# Patient Record
Sex: Female | Born: 1955 | Race: White | Hispanic: No | Marital: Married | Smoking: Former smoker
Health system: Southern US, Community
[De-identification: ages and names within clinical notes are randomized; demographics above are authoritative.]

## PROBLEM LIST (undated history)

## (undated) DIAGNOSIS — E119 Type 2 diabetes mellitus without complications: Secondary | ICD-10-CM

## (undated) DIAGNOSIS — G4733 Obstructive sleep apnea (adult) (pediatric): Secondary | ICD-10-CM

## (undated) DIAGNOSIS — J189 Pneumonia, unspecified organism: Secondary | ICD-10-CM

## (undated) DIAGNOSIS — I480 Paroxysmal atrial fibrillation: Secondary | ICD-10-CM

## (undated) DIAGNOSIS — Z860100 Personal history of colon polyps, unspecified: Secondary | ICD-10-CM

## (undated) DIAGNOSIS — I4729 Other ventricular tachycardia: Secondary | ICD-10-CM

## (undated) DIAGNOSIS — I341 Nonrheumatic mitral (valve) prolapse: Secondary | ICD-10-CM

## (undated) DIAGNOSIS — G4730 Sleep apnea, unspecified: Secondary | ICD-10-CM

## (undated) DIAGNOSIS — E039 Hypothyroidism, unspecified: Secondary | ICD-10-CM

## (undated) DIAGNOSIS — I6529 Occlusion and stenosis of unspecified carotid artery: Secondary | ICD-10-CM

## (undated) DIAGNOSIS — I10 Essential (primary) hypertension: Secondary | ICD-10-CM

## (undated) DIAGNOSIS — K5790 Diverticulosis of intestine, part unspecified, without perforation or abscess without bleeding: Secondary | ICD-10-CM

## (undated) DIAGNOSIS — K219 Gastro-esophageal reflux disease without esophagitis: Secondary | ICD-10-CM

## (undated) DIAGNOSIS — I472 Ventricular tachycardia: Secondary | ICD-10-CM

## (undated) DIAGNOSIS — E785 Hyperlipidemia, unspecified: Secondary | ICD-10-CM

## (undated) HISTORY — DX: Nonrheumatic mitral (valve) prolapse: I34.1

## (undated) HISTORY — DX: Paroxysmal atrial fibrillation: I48.0

## (undated) HISTORY — DX: Diverticulosis of intestine, part unspecified, without perforation or abscess without bleeding: K57.90

## (undated) HISTORY — DX: Personal history of colon polyps, unspecified: Z86.0100

## (undated) HISTORY — DX: Obstructive sleep apnea (adult) (pediatric): G47.33

## (undated) HISTORY — DX: Personal history of colonic polyps: Z86.010

## (undated) HISTORY — DX: Type 2 diabetes mellitus without complications: E11.9

## (undated) HISTORY — DX: Other ventricular tachycardia: I47.29

## (undated) HISTORY — DX: Sleep apnea, unspecified: G47.30

## (undated) HISTORY — DX: Occlusion and stenosis of unspecified carotid artery: I65.29

## (undated) HISTORY — DX: Hyperlipidemia, unspecified: E78.5

## (undated) HISTORY — DX: Ventricular tachycardia: I47.2

## (undated) HISTORY — DX: Essential (primary) hypertension: I10

## (undated) HISTORY — DX: Pneumonia, unspecified organism: J18.9

## (undated) HISTORY — DX: Gastro-esophageal reflux disease without esophagitis: K21.9

---

## 2000-03-07 HISTORY — PX: ABDOMINAL HYSTERECTOMY: SHX81

## 2005-03-07 HISTORY — PX: CLAVICLE SURGERY: SHX598

## 2010-03-07 HISTORY — PX: KNEE ARTHROSCOPY: SUR90

## 2012-03-19 LAB — HM COLONOSCOPY

## 2014-01-01 LAB — HM MAMMOGRAPHY

## 2014-05-08 LAB — PULMONARY FUNCTION TEST

## 2014-08-21 ENCOUNTER — Encounter: Payer: Self-pay | Admitting: Primary Care

## 2014-08-21 ENCOUNTER — Encounter (INDEPENDENT_AMBULATORY_CARE_PROVIDER_SITE_OTHER): Payer: Self-pay

## 2014-08-21 ENCOUNTER — Ambulatory Visit (INDEPENDENT_AMBULATORY_CARE_PROVIDER_SITE_OTHER): Payer: Managed Care, Other (non HMO) | Admitting: Primary Care

## 2014-08-21 VITALS — BP 136/92 | HR 78 | Temp 98.1°F | Ht 66.0 in | Wt 175.8 lb

## 2014-08-21 DIAGNOSIS — E119 Type 2 diabetes mellitus without complications: Secondary | ICD-10-CM | POA: Diagnosis not present

## 2014-08-21 DIAGNOSIS — E785 Hyperlipidemia, unspecified: Secondary | ICD-10-CM

## 2014-08-21 DIAGNOSIS — I10 Essential (primary) hypertension: Secondary | ICD-10-CM

## 2014-08-21 DIAGNOSIS — E039 Hypothyroidism, unspecified: Secondary | ICD-10-CM | POA: Diagnosis not present

## 2014-08-21 DIAGNOSIS — E782 Mixed hyperlipidemia: Secondary | ICD-10-CM | POA: Insufficient documentation

## 2014-08-21 NOTE — Assessment & Plan Note (Signed)
Managed on levothyroxine 88 mcg. Reports last TSH was WNL.

## 2014-08-21 NOTE — Assessment & Plan Note (Signed)
Managed on quinapril and verapamil daily. Slightly elevated upon exam today. She's been under family stress recently. She is to record her readings at home and bring them to next visit in 3 weeks. Will evaluate and adjust meds if necessary.

## 2014-08-21 NOTE — Patient Instructions (Signed)
Start checking your blood pressure once daily around the same time. Ensure you are resting for at least 15 minutes before you measure.  Follow up in 3-4 weeks for re-evaluation of blood pressure.  It was a pleasure to meet you today! Please don't hesitate to call me with any questions. Welcome to [facility]!

## 2014-08-21 NOTE — Progress Notes (Signed)
Pre visit review using our clinic review tool, if applicable. No additional management support is needed unless otherwise documented below in the visit note. 

## 2014-08-21 NOTE — Assessment & Plan Note (Signed)
Managed on pravastatin 20 mg and zetia 10 mg daily. Denies myalgias. Endorses mostly healthy diet although not recently due to stress and move from [place]. Will monitor.

## 2014-08-21 NOTE — Progress Notes (Signed)
Subjective:    Patient ID: Debra Perez, female    DOB: 15-Jan-1956, 59 y.o.   MRN: [id number]  HPI  Debra Perez is a 59 year old female who presents today to establish care and discuss the problems mentioned below. Will review old records which she brought to appointment.  1) Hypertension: Diagnosed 13 years ago. In January 2016 and had a run of ventricular tachycardia while in the hospital for a viral respiratory infection. She then when into atrial fibrillation several days later for a duration of 4 hours and has not been in since. She is managed on verapamil (February 2016 for rate control as well) and quinapril (13 years). She's recently been under a lot of family stress. She will start recording her BP at home and bring her recordings during the next visit.   2) Type 2 Diabetes: Diagnosed 13 years ago. She is managed on glimiperide and janumet. She was evaluated by an endocrinologist and was put on SGL2 and could not tolerate. She checks fasting blood sugars at home and will get 100-130's. Lowest levels have been mid 60's. She has not had anything higher than 210 in the past several months. Last A1C was 7.3 in Feburary 2016  3) Hyperlipidemia: Managed on Pravastatin 20 mg and Zetia 10 mg. Diet consists of oatmeal, nuts, hummus, lean meat, vegetables, some ice cream. She drinks mostly water. She is currently not exercising. Denies myalgias.  4) Hypothyroidism: Diagnosed 15 years and is managed on levothyroxine 88 mcg. Denise palpitations, fatigue.  Review of Systems  Constitutional: Negative for unexpected weight change.  HENT: Negative for rhinorrhea.   Respiratory: Negative for cough and shortness of breath.   Cardiovascular: Negative for chest pain.  Gastrointestinal: Negative for diarrhea and constipation.  Genitourinary: Negative for dysuria and frequency.  Musculoskeletal: Negative for myalgias and arthralgias.  Skin: Negative for rash.  Neurological: Negative for dizziness  and headaches.  Psychiatric/Behavioral:       Denies concerns for anxiety or depression       Past Medical History  Diagnosis Date  . GERD (gastroesophageal reflux disease)   . Hyperlipidemia   . Hypertension   . Diabetes mellitus without complication   . Thyroid disease   . History of colonic polyps     benign  . Mitral valve prolapse   . Obstructive sleep apnea     on CPAP    History   Social History  . Marital Status: Married    Spouse Name: N/A  . Number of Children: N/A  . Years of Education: N/A   Occupational History  . Not on file.   Social History Main Topics  . Smoking status: Former Research scientist (life sciences)  . Smokeless tobacco: Not on file  . Alcohol Use: No  . Drug Use: Not on file  . Sexual Activity: Not on file   Other Topics Concern  . Not on file   Social History Narrative   Married.   1 daughter.   Moved here from [place] due to husbands occupation.   She is a Corporate treasurer.   Enjoys sewing, gardening.    Past Surgical History  Procedure Laterality Date  . Knee arthroscopy  2012  . Clavicle surgery  2007    right clavicle plate pins  . Abdominal hysterectomy  2002    ovaries remain    Family History  Problem Relation Age of Onset  . Depression Mother   . Arthritis Father   . Heart disease Father   . Hypertension  Father   . Sudden death Mother 9    No Known Allergies  No current outpatient prescriptions on file prior to visit.   No current facility-administered medications on file prior to visit.    BP 136/92 mmHg  Pulse 78  Temp(Src) 98.1 F (36.7 C) (Oral)  Ht 5\' 6"  (1.676 m)  Wt 175 lb 12.8 oz (79.742 kg)  BMI 28.39 kg/m2  SpO2 98%    Objective:   Physical Exam  Constitutional: She is oriented to person, place, and time. She appears well-nourished.  HENT:  Head: Normocephalic.  Neck: Neck supple.  Cardiovascular: Normal rate and regular rhythm.   Pulmonary/Chest: Effort normal and breath sounds normal.  Musculoskeletal: Normal  range of motion.  Neurological: She is alert and oriented to person, place, and time.  Skin: Skin is warm and dry.  Psychiatric: She has a normal mood and affect.          Assessment & Plan:

## 2014-08-21 NOTE — Assessment & Plan Note (Signed)
Managed on glimiperide and janumet daily. BS 110-130's.  Last A1C 7.3 in Feburary. Will recheck in August. Consider microalbumin if non completed in past. Will review records.

## 2014-09-17 ENCOUNTER — Ambulatory Visit: Payer: Managed Care, Other (non HMO) | Admitting: Primary Care

## 2014-09-17 ENCOUNTER — Encounter: Payer: Self-pay | Admitting: Primary Care

## 2014-09-17 ENCOUNTER — Ambulatory Visit (INDEPENDENT_AMBULATORY_CARE_PROVIDER_SITE_OTHER): Payer: Managed Care, Other (non HMO) | Admitting: Primary Care

## 2014-09-17 VITALS — BP 124/70 | HR 93 | Temp 97.8°F | Ht 66.0 in | Wt 175.8 lb

## 2014-09-17 DIAGNOSIS — N951 Menopausal and female climacteric states: Secondary | ICD-10-CM | POA: Diagnosis not present

## 2014-09-17 DIAGNOSIS — I10 Essential (primary) hypertension: Secondary | ICD-10-CM

## 2014-09-17 DIAGNOSIS — R232 Flushing: Secondary | ICD-10-CM | POA: Insufficient documentation

## 2014-09-17 DIAGNOSIS — I480 Paroxysmal atrial fibrillation: Secondary | ICD-10-CM | POA: Diagnosis not present

## 2014-09-17 MED ORDER — VENLAFAXINE HCL ER 37.5 MG PO CP24: 37.5000 mg | ORAL_CAPSULE | Freq: Every day | ORAL | Status: DC

## 2014-09-17 NOTE — Assessment & Plan Note (Signed)
Present since February 2016 with a history of v-fib. Moved from [place] recently and would like to establish with cardiology in [place]. She does still experience paroxysmal afib. Referral made. Denies chest pain, palpitations.

## 2014-09-17 NOTE — Assessment & Plan Note (Signed)
Present for 2 years, worse over past 6 months. Present during visit today. RX for Effexor 37.5 mg. Follow up in August.

## 2014-09-17 NOTE — Assessment & Plan Note (Signed)
Stable with normal readings over past 3 weeks as she brought them into the office. Denies chest pain, SOB, headaches.

## 2014-09-17 NOTE — Progress Notes (Signed)
Pre visit review using our clinic review tool, if applicable. No additional management support is needed unless otherwise documented below in the visit note. 

## 2014-09-17 NOTE — Progress Notes (Signed)
Subjective:    Patient ID: Debra Perez, female    DOB: 1955/05/06, 59 y.o.   MRN: [id number]  HPI  Debra Perez is a 59 year old female who presents today for follow up of blood pressure. She was evaluated on 6/16 as a new patient and noted elevated blood pressure readings despite being on quinalapril and verapimil daily. During the last visit she was experiencing increased stress due to family issues and caring for a family member.  She has been recording her blood pressure over the past several weeks which has been 116-130's/70's-80's. Denies chest pain, headaches, blurred vision.  2) Ear fullness: Present to left ear for the past several years intermittently. Today she feels ear fullness to the left side. She takes daily Flonase and hydro mist spray. No daily antihistamine.  3) Atrial fibrillation/V-Fib: Occurred February 2016. Developed after a pneumonia/lung infection. She followed with her cardiologist every 6 months for re-evaluation. She is on verapamil and aspirin 325 mg daily. She was once on Pradaxa which caused GI irritation. She recently moved from [place] and will need to establish in [place].  4) Hot flashes: Present for the past 2 years and have worsened over the past 6 months. Occurs throughout the day every day. Present during visit today  Review of Systems  HENT: Negative for congestion, ear pain, postnasal drip and sore throat.        Left ear fullness  Respiratory: Negative for cough and shortness of breath.   Cardiovascular: Negative for chest pain and palpitations.       Reports paroxysmal afib.  Neurological: Negative for dizziness and headaches.       Past Medical History  Diagnosis Date  . GERD (gastroesophageal reflux disease)   . Hyperlipidemia   . Hypertension   . Diabetes mellitus without complication   . Thyroid disease   . History of colonic polyps     benign  . Mitral valve prolapse   . Obstructive sleep apnea     on CPAP    History   Social  History  . Marital Status: Married    Spouse Name: N/A  . Number of Children: N/A  . Years of Education: N/A   Occupational History  . Not on file.   Social History Main Topics  . Smoking status: Former Research scientist (life sciences)  . Smokeless tobacco: Not on file  . Alcohol Use: No  . Drug Use: Not on file  . Sexual Activity: Not on file   Other Topics Concern  . Not on file   Social History Narrative   Married.   1 daughter.   Moved here from [place] due to husbands occupation.   She is a Corporate treasurer.   Enjoys sewing, gardening.    Past Surgical History  Procedure Laterality Date  . Knee arthroscopy  2012  . Clavicle surgery  2007    right clavicle plate pins  . Abdominal hysterectomy  2002    ovaries remain    Family History  Problem Relation Age of Onset  . Depression Mother   . Arthritis Father   . Heart disease Father   . Hypertension Father   . Sudden death Mother 84    No Known Allergies  Current Outpatient Prescriptions on File Prior to Visit  Medication Sig Dispense Refill  . [name] 50 MG tablet Take 50 mg by mouth at bedtime.    Marland Kitchen aspirin 325 MG tablet Take 325 mg by mouth daily.    Marland Kitchen b complex  vitamins capsule Take 1 capsule by mouth 2 (two) times daily.    . calcium carbonate (TUMS - DOSED IN MG ELEMENTAL CALCIUM) 500 MG chewable tablet Chew 1 tablet by mouth daily.    Marland Kitchen co-enzyme Q-10 50 MG capsule Take 50 mg by mouth daily.    Marland Kitchen docusate sodium (COLACE) 100 MG capsule Take 100 mg by mouth 2 (two) times daily.    Marland Kitchen dronedarone (MULTAQ) 400 MG tablet Take 400 mg by mouth 2 (two) times daily with a meal.    . ezetimibe (ZETIA) 10 MG tablet Take 10 mg by mouth daily.    Marland Kitchen glimepiride (AMARYL) 1 MG tablet Take 1 mg by mouth daily with breakfast.    . levothyroxine (SYNTHROID, LEVOTHROID) 88 MCG tablet Take 88 mcg by mouth daily before breakfast.    . Magnesium 500 MG CAPS Take 1 capsule by mouth 2 (two) times daily.    . Misc Natural Products (TUMERSAID PO)  Take by mouth.    . Multiple Vitamins-Minerals (MULTIVITAMIN ADULT PO) Take 1 capsule by mouth daily.    . Omega-3 Fatty Acids (FISH OIL) 1200 MG CAPS Take 1 capsule by mouth daily after breakfast.    . omeprazole (PRILOSEC) 20 MG capsule Take 20 mg by mouth daily.    . pravastatin (PRAVACHOL) 20 MG tablet Take 20 mg by mouth daily.    . quinapril (ACCUPRIL) 20 MG tablet Take 20 mg by mouth at bedtime.    . sitaGLIPtin-metformin (JANUMET) 50-1000 MG per tablet Take 1 tablet by mouth 2 (two) times daily with a meal.    . verapamil (CALAN) 120 MG tablet Take 120 mg by mouth 3 (three) times daily.     No current facility-administered medications on file prior to visit.    BP 124/70 mmHg  Pulse 93  Temp(Src) 97.8 F (36.6 C) (Oral)  Ht 5\' 6"  (1.676 m)  Wt 175 lb 12.8 oz (79.742 kg)  BMI 28.39 kg/m2  SpO2 98%    Objective:   Physical Exam  Constitutional: She does not appear ill.  HENT:  Right Ear: Tympanic membrane and ear canal normal.  Left Ear: Tympanic membrane and ear canal normal.  No middle ear effusion.  Nose: Nose normal.  Mouth/Throat: Oropharynx is clear and moist.  No cerumen impaction bilaterally  Eyes: Conjunctivae are normal. Pupils are equal, round, and reactive to light.  Cardiovascular: Normal rate, regular rhythm and normal heart sounds.   Pulmonary/Chest: Effort normal and breath sounds normal.  Skin: Skin is warm and dry.  Hot flashes present. Skin pink with warmth.          Assessment & Plan:  Ear fullness:  Present to left ear intermittently x years. No cerumen impaction or effusion noted on exam. Start daily antihistamine such as claritin. Continue flonase PRN. Follow up PRN

## 2014-09-17 NOTE — Patient Instructions (Signed)
Start Effexor 37.5 mg tablets for hot flashes. Take 1 tablet by mouth daily.  Try taking a daily antihistamine such as claritin, zyrtec, or allegra for ear fullness. Continue flonase as needed.  Please schedule a physical with me in August. You will also schedule a lab only appointment one week prior. We will discuss your lab results during your physical.  You will be contacted regarding your referral to Cardiology.  Please let us know if you have not heard back within one week.   It was nice to see you!

## 2014-09-29 ENCOUNTER — Encounter: Payer: Self-pay | Admitting: *Deleted

## 2014-10-22 ENCOUNTER — Other Ambulatory Visit: Payer: Self-pay | Admitting: Primary Care

## 2014-10-22 DIAGNOSIS — I10 Essential (primary) hypertension: Secondary | ICD-10-CM

## 2014-10-22 DIAGNOSIS — E785 Hyperlipidemia, unspecified: Secondary | ICD-10-CM

## 2014-10-22 DIAGNOSIS — E2839 Other primary ovarian failure: Secondary | ICD-10-CM

## 2014-10-22 DIAGNOSIS — E119 Type 2 diabetes mellitus without complications: Secondary | ICD-10-CM

## 2014-10-29 ENCOUNTER — Other Ambulatory Visit (INDEPENDENT_AMBULATORY_CARE_PROVIDER_SITE_OTHER): Payer: Managed Care, Other (non HMO)

## 2014-10-29 DIAGNOSIS — E119 Type 2 diabetes mellitus without complications: Secondary | ICD-10-CM

## 2014-10-29 DIAGNOSIS — E785 Hyperlipidemia, unspecified: Secondary | ICD-10-CM

## 2014-10-29 DIAGNOSIS — I10 Essential (primary) hypertension: Secondary | ICD-10-CM

## 2014-10-29 DIAGNOSIS — E2839 Other primary ovarian failure: Secondary | ICD-10-CM | POA: Diagnosis not present

## 2014-10-29 LAB — CBC
HCT: 34.4 % — ABNORMAL LOW (ref 36.0–46.0)
HEMOGLOBIN: 11.2 g/dL — AB (ref 12.0–15.0)
MCHC: 32.6 g/dL (ref 30.0–36.0)
MCV: 79.5 fl (ref 78.0–100.0)
PLATELETS: 366 10*3/uL (ref 150.0–400.0)
RBC: 4.32 Mil/uL (ref 3.87–5.11)
RDW: 18.4 % — AB (ref 11.5–15.5)
WBC: 8.4 10*3/uL (ref 4.0–10.5)

## 2014-10-29 LAB — COMPREHENSIVE METABOLIC PANEL
ALT: 25 U/L (ref 0–35)
AST: 24 U/L (ref 0–37)
Albumin: 4 g/dL (ref 3.5–5.2)
Alkaline Phosphatase: 40 U/L (ref 39–117)
BUN: 15 mg/dL (ref 6–23)
CALCIUM: 9.5 mg/dL (ref 8.4–10.5)
CHLORIDE: 102 meq/L (ref 96–112)
CO2: 29 meq/L (ref 19–32)
Creatinine, Ser: 0.87 mg/dL (ref 0.40–1.20)
GFR: 71.41 mL/min (ref >60.00)
Glucose, Bld: 98 mg/dL (ref 70–99)
Potassium: 4.3 mEq/L (ref 3.5–5.1)
Sodium: 139 mEq/L (ref 135–145)
Total Bilirubin: 0.3 mg/dL (ref 0.2–1.2)
Total Protein: 7.1 g/dL (ref 6.0–8.3)

## 2014-10-29 LAB — VITAMIN D 25 HYDROXY (VIT D DEFICIENCY, FRACTURES): VITD: 47.41 ng/mL (ref 30.00–100.00)

## 2014-10-29 LAB — LIPID PANEL
CHOL/HDL RATIO: 3
Cholesterol: 154 mg/dL (ref 0–200)
HDL: 54.9 mg/dL (ref >39.00)
LDL CALC: 83 mg/dL (ref 0–99)
NonHDL: 98.8
TRIGLYCERIDES: 80 mg/dL (ref 0.0–149.0)
VLDL: 16 mg/dL (ref 0.0–40.0)

## 2014-10-29 LAB — MICROALBUMIN / CREATININE URINE RATIO
CREATININE, U: 154.4 mg/dL
MICROALB UR: 0.9 mg/dL (ref 0.0–1.9)
MICROALB/CREAT RATIO: 0.6 mg/g (ref 0.0–30.0)

## 2014-10-29 LAB — HEMOGLOBIN A1C: Hgb A1c MFr Bld: 6.9 % — ABNORMAL HIGH (ref 4.6–6.5)

## 2014-11-05 ENCOUNTER — Ambulatory Visit (INDEPENDENT_AMBULATORY_CARE_PROVIDER_SITE_OTHER): Payer: Managed Care, Other (non HMO) | Admitting: Primary Care

## 2014-11-05 ENCOUNTER — Encounter: Payer: Self-pay | Admitting: Primary Care

## 2014-11-05 VITALS — BP 124/80 | HR 70 | Temp 97.5°F | Ht 66.0 in | Wt 176.0 lb

## 2014-11-05 DIAGNOSIS — I480 Paroxysmal atrial fibrillation: Secondary | ICD-10-CM

## 2014-11-05 DIAGNOSIS — E119 Type 2 diabetes mellitus without complications: Secondary | ICD-10-CM

## 2014-11-05 DIAGNOSIS — R232 Flushing: Secondary | ICD-10-CM

## 2014-11-05 DIAGNOSIS — I10 Essential (primary) hypertension: Secondary | ICD-10-CM

## 2014-11-05 DIAGNOSIS — G5682 Other specified mononeuropathies of left upper limb: Secondary | ICD-10-CM

## 2014-11-05 DIAGNOSIS — N951 Menopausal and female climacteric states: Secondary | ICD-10-CM | POA: Diagnosis not present

## 2014-11-05 DIAGNOSIS — Z0000 Encounter for general adult medical examination without abnormal findings: Secondary | ICD-10-CM

## 2014-11-05 DIAGNOSIS — Z23 Encounter for immunization: Secondary | ICD-10-CM | POA: Diagnosis not present

## 2014-11-05 DIAGNOSIS — E785 Hyperlipidemia, unspecified: Secondary | ICD-10-CM

## 2014-11-05 DIAGNOSIS — E039 Hypothyroidism, unspecified: Secondary | ICD-10-CM

## 2014-11-05 MED ORDER — PREDNISONE 20 MG PO TABS: ORAL_TABLET | ORAL | Status: DC

## 2014-11-05 MED ORDER — VENLAFAXINE HCL ER 37.5 MG PO CP24: 37.5000 mg | ORAL_CAPSULE | Freq: Every day | ORAL | Status: DC

## 2014-11-05 NOTE — Assessment & Plan Note (Signed)
Stable today. Continue current regimen. 

## 2014-11-05 NOTE — Assessment & Plan Note (Signed)
TSH WNL on 04/2014. Continue levothyroxine 88 mcg. Will re-evaluate in 04/2015 unless she becomes symptomatic.  Exam unremarkable today

## 2014-11-05 NOTE — Progress Notes (Signed)
Pre visit review using our clinic review tool, if applicable. No additional management support is needed unless otherwise documented below in the visit note. 

## 2014-11-05 NOTE — Progress Notes (Signed)
Subjective:    Patient ID: Debra Perez, female    DOB: 09/05/1955, 59 y.o.   MRN: [id number]  HPI  Ms. Debra Perez is a 59 year old female who presents today for complete physical.  Immunizations: -Tetanus: Has not completed in over 10 years -Influenza: Declines today. Will schedule an appointment during flu clinic. -Pneumonia: Completed Pneumovax 3 years ago.  Diet: Endorses fair diet and consists of: Breakfast: yogurt and fruit Lunch: Pita and hummus Dinner: Protein and vegetable. Desserts: Ice cream 4 days a week Snacks: Carrots Beverages: Drinks mostly water Exercise: She does not currently exercise Eye exam: Completed March 2016 Dental exam: Due. Last visit was 6 months ago. Colonoscopy: Completed in 2014 Dexa: Has never completed Pap Smear: Hysterectomy, ovaries remain Mammogram: Completes annually in October.  1) Hot Flashes: Evaluated in July 2016 for hot flashes that have been present for 2 years which have worsened over the past 6 months. She was initiated on Effexor 37.5 mg last visit. Since her last visit she's had a major reduction in hot flashes.   2) Diabetes Type 2: Diagnosed 13+ years ago. Is currently managed on glimiperide and janumet. Last A1C was in February 2016 at 7.3. A1C last week of 6.9.   Wt Readings from Last 3 Encounters:  11/05/14 176 lb (79.833 kg)  09/17/14 175 lb 12.8 oz (79.742 kg)  08/21/14 175 lb 12.8 oz (79.742 kg)     3) Back/Shoulder pain: History of pinched nerve to the left upper back with numbness/tingling down left arm. She was evaluated by ortho who found this on MRI. Her symptoms began 13 days ago and feel exactly the same as her prior pinched nerve. She visited her Chiropractor this Monday and had a massage yesterday without relief. She's been taking flexeril and advil without relief.    Review of Systems  Constitutional: Negative for unexpected weight change.  HENT: Negative for rhinorrhea.   Respiratory: Negative for  cough and shortness of breath.   Cardiovascular: Negative for chest pain.  Gastrointestinal: Negative for diarrhea and constipation.  Genitourinary: Negative for difficulty urinating.  Musculoskeletal: Positive for myalgias.       Current left upper back pain from pinched nerve  Skin: Negative for rash.  Neurological: Positive for numbness. Negative for dizziness and headaches.  Psychiatric/Behavioral:       Denies concerns for anxiety or depression       Past Medical History  Diagnosis Date  . GERD (gastroesophageal reflux disease)   . Hyperlipidemia   . Hypertension   . Diabetes mellitus without complication   . Thyroid disease   . History of colonic polyps     benign  . Mitral valve prolapse   . Obstructive sleep apnea     on CPAP    Social History   Social History  . Marital Status: Married    Spouse Name: N/A  . Number of Children: N/A  . Years of Education: N/A   Occupational History  . Not on file.   Social History Main Topics  . Smoking status: Former Research scientist (life sciences)  . Smokeless tobacco: Not on file  . Alcohol Use: No  . Drug Use: Not on file  . Sexual Activity: Not on file   Other Topics Concern  . Not on file   Social History Narrative   Married.   1 daughter.   Moved here from [place] due to husbands occupation.   She is a Corporate treasurer.   Enjoys sewing, gardening.    Past  Surgical History  Procedure Laterality Date  . Knee arthroscopy  2012  . Clavicle surgery  2007    right clavicle plate pins  . Abdominal hysterectomy  2002    ovaries remain    Family History  Problem Relation Age of Onset  . Depression Mother   . Arthritis Father   . Heart disease Father   . Hypertension Father   . Sudden death Mother 77    No Known Allergies  Current Outpatient Prescriptions on File Prior to Visit  Medication Sig Dispense Refill  . [name] 50 MG tablet Take 50 mg by mouth at bedtime.    Marland Kitchen aspirin 325 MG tablet Take 325 mg by mouth daily.      Marland Kitchen b complex vitamins capsule Take 1 capsule by mouth 2 (two) times daily.    . calcium carbonate (TUMS - DOSED IN MG ELEMENTAL CALCIUM) 500 MG chewable tablet Chew 1 tablet by mouth daily.    Marland Kitchen co-enzyme Q-10 50 MG capsule Take 50 mg by mouth daily.    Marland Kitchen docusate sodium (COLACE) 100 MG capsule Take 100 mg by mouth 2 (two) times daily.    Marland Kitchen dronedarone (MULTAQ) 400 MG tablet Take 400 mg by mouth 2 (two) times daily with a meal.    . ezetimibe (ZETIA) 10 MG tablet Take 10 mg by mouth daily.    Marland Kitchen glimepiride (AMARYL) 1 MG tablet Take 1 mg by mouth daily with breakfast.    . levothyroxine (SYNTHROID, LEVOTHROID) 88 MCG tablet Take 88 mcg by mouth daily before breakfast.    . Magnesium 500 MG CAPS Take 1 capsule by mouth 2 (two) times daily.    . Misc Natural Products (TUMERSAID PO) Take by mouth.    . Multiple Vitamins-Minerals (MULTIVITAMIN ADULT PO) Take 1 capsule by mouth daily.    . Omega-3 Fatty Acids (FISH OIL) 1200 MG CAPS Take 1 capsule by mouth daily after breakfast.    . omeprazole (PRILOSEC) 20 MG capsule Take 20 mg by mouth daily.    . pravastatin (PRAVACHOL) 20 MG tablet Take 20 mg by mouth daily.    . quinapril (ACCUPRIL) 20 MG tablet Take 20 mg by mouth at bedtime.    . sitaGLIPtin-metformin (JANUMET) 50-1000 MG per tablet Take 1 tablet by mouth 2 (two) times daily with a meal.    . verapamil (CALAN) 120 MG tablet Take 120 mg by mouth 3 (three) times daily.     No current facility-administered medications on file prior to visit.    BP 124/80 mmHg  Pulse 70  Temp(Src) 97.5 F (36.4 C) (Oral)  Ht 5\' 6"  (1.676 m)  Wt 176 lb (79.833 kg)  BMI 28.42 kg/m2  SpO2 94%    Objective:   Physical Exam  Constitutional: She is oriented to person, place, and time. She appears well-nourished.  HENT:  Right Ear: Tympanic membrane and ear canal normal.  Left Ear: Tympanic membrane and ear canal normal.  Nose: Nose normal.  Mouth/Throat: Oropharynx is clear and moist.  Eyes:  Conjunctivae and EOM are normal. Pupils are equal, round, and reactive to light.  Neck: Neck supple.  Cardiovascular: Normal rate and regular rhythm.   Pulmonary/Chest: Effort normal and breath sounds normal.  Abdominal: Soft. Bowel sounds are normal. There is no tenderness.  Musculoskeletal: Normal range of motion. She exhibits tenderness.  Tenderness to left posterior shoulder. No decrease in ROM.  Lymphadenopathy:    She has no cervical adenopathy.  Neurological: She is alert  and oriented to person, place, and time. She has normal reflexes. No cranial nerve deficit.  Skin: Skin is warm and dry.  Psychiatric: She has a normal mood and affect.          Assessment & Plan:  Shoulder pain:  Present to left posterior shoulder x 13 days. Feels like a pinched nerve which she's had in the past. No relief with flexeril and advil Good ROM, tenderness to left posterior shoulder with numbness/tingling down left arm. RX for short burst of prednisone. Discussed the effects of prednisone on sugars. Heat, rest. Follow up PRN.

## 2014-11-05 NOTE — Patient Instructions (Signed)
Start Prednisone for pinched nerve to your shoulder. Take 2 tablets daily for 5 days. This medication will increase your blood sugar. Please be aware and reduce your intake of carbohydrates.  Work to decrease your carbohydrates in the form of bread, pasta, rice, ice cream, and sugary foods.  Continue Effexor for hot flashes.  Please schedule a follow up appointment in 3 months for re-evaluation of diabetes.  It was a pleasure to see you today!

## 2014-11-05 NOTE — Addendum Note (Signed)
Addended by: Jacqualin Combes on: 11/05/2014 02:12 PM   Modules accepted: Orders

## 2014-11-05 NOTE — Assessment & Plan Note (Signed)
Tdap provided today. Declines flu, will wait for flu clinic. Pneumovax completed 3 years ago. Mammogram due in October 2016. No pap as she's had a hysterectomy. Labs mostly unremarkable. Decrease in A1C, slightly anemic. Exam unremarkable except for left shoulder pain. Follow up in 1 year for repeat physical.

## 2014-11-05 NOTE — Assessment & Plan Note (Signed)
Rate and rhythm regular today. Has initial visit with cardiology on 9/1.

## 2014-11-05 NOTE — Assessment & Plan Note (Signed)
Stable on Zetia and pravastatin. Continue current regimen.

## 2014-11-05 NOTE — Assessment & Plan Note (Signed)
Improved with Effexor. Continue same. Will continue to monitor.

## 2014-11-05 NOTE — Assessment & Plan Note (Addendum)
Managed on glimepiride and janumet. A1C improved to 6.9. No change in meds today. She will work to decrease ice cream and carbs. Urine microalbumin completed and is unremarkable. Follow up in 3 months

## 2014-11-06 ENCOUNTER — Encounter: Payer: Self-pay | Admitting: Cardiovascular Disease

## 2014-11-06 ENCOUNTER — Ambulatory Visit (INDEPENDENT_AMBULATORY_CARE_PROVIDER_SITE_OTHER): Payer: Managed Care, Other (non HMO) | Admitting: Cardiovascular Disease

## 2014-11-06 VITALS — BP 124/74 | HR 75 | Ht 66.0 in | Wt 175.0 lb

## 2014-11-06 DIAGNOSIS — I4891 Unspecified atrial fibrillation: Secondary | ICD-10-CM | POA: Diagnosis not present

## 2014-11-06 DIAGNOSIS — K219 Gastro-esophageal reflux disease without esophagitis: Secondary | ICD-10-CM | POA: Diagnosis not present

## 2014-11-06 DIAGNOSIS — I4729 Other ventricular tachycardia: Secondary | ICD-10-CM | POA: Insufficient documentation

## 2014-11-06 DIAGNOSIS — I10 Essential (primary) hypertension: Secondary | ICD-10-CM

## 2014-11-06 DIAGNOSIS — I472 Ventricular tachycardia: Secondary | ICD-10-CM

## 2014-11-06 DIAGNOSIS — E785 Hyperlipidemia, unspecified: Secondary | ICD-10-CM | POA: Diagnosis not present

## 2014-11-06 DIAGNOSIS — E119 Type 2 diabetes mellitus without complications: Secondary | ICD-10-CM | POA: Diagnosis not present

## 2014-11-06 MED ORDER — METOPROLOL TARTRATE 25 MG PO TABS: 25.0000 mg | ORAL_TABLET | Freq: Two times a day (BID) | ORAL | Status: DC

## 2014-11-06 NOTE — Assessment & Plan Note (Signed)
We have encouraged continued exercise, careful diet management in an effort to lose weight. Dietary guide provided today

## 2014-11-06 NOTE — Assessment & Plan Note (Signed)
Episode of atrial fibrillation February 2016 in the setting of upper respiratory infection, after taking albuterol. No further episodes per the patient. She does have a chads vasc score of 3, though given the circumstances, is low risk at this time of recurrent arrhythmia. She is requesting we hold her multaq as she is having significant GERD symptoms and she attributes her symptoms to this medication. We will hold medication, restart metoprolol 25 mg twice a day (presumably this was previously held for reactive airway disease in the setting of URI. Recommended she contact our office if she has any further symptoms concerning for arrhythmia. Strongly recommended she decrease the dose of aspirin down to no more than 81 mg daily

## 2014-11-06 NOTE — Assessment & Plan Note (Signed)
Severe symptoms recently, possibly with esophagitis. She is taking omeprazole 20 mg twice a day. Recommended she decrease or even hold the aspirin until symptoms have resolved We'll hold the multaq at her request If symptoms do not improve, recommended she take Pepcid or Zantac for breakthrough symptoms. Or persistent symptoms, may need pantoprazole or dexilant

## 2014-11-06 NOTE — Patient Instructions (Addendum)
You are doing well.  Please hold the multaq Please start metoprolol 25 mg twice a day  Decrease the aspirin down to 81 mg once a day (take a break for a week or two for GERD)  Ok to take zantac/pepcid for breakthrough GERD sx  Watch the carbs  Please call us if you have new issues that need to be addressed before your next appt.  Your physician wants you to follow-up in: 12 months.  You will receive a reminder letter in the mail two months in advance. If you don't receive a letter, please call our office to schedule the follow-up appointment.

## 2014-11-06 NOTE — Assessment & Plan Note (Signed)
We will hold the multaq and restart metoprolol 25 mg twice a day at her request She feels the former is causing GERD symptoms, also very expensive

## 2014-11-06 NOTE — Progress Notes (Signed)
Patient ID: [name], female    DOB: Sep 20, 1955, 59 y.o.   MRN: [id number]  HPI Comments: Ms. Colantonio is a pleasant 59 year old woman with history of nonsustained VT/palpitations, normal ejection fraction with EF greater than 60%, mild three-vessel coronary artery disease on her nares CTA scan, 20 years of smoking who stopped 13 years ago, recent upper respiratory infection February 2016 who developed atrial fibrillation after inhaling albuterol while on prednisone, evaluated in the hospital at that time with arrhythmia lasting for hours before converting back to normal sinus rhythm, presents to establish care in the [place] office.Marland Kitchen History of diabetes, Also with mild positional obstructive sleep apnea, on CPAP  In general she reports that she is doing well. She moved to the area from [place] when her husband changed jobs. She likes the area and has not had any significant problems since her arrival. In [place], she developed a severe upper respiratory infection, failed outpatient ABX. At home she was using Solu-Medrol, albuterol. Shortly after using the albuterol, she developed tachycardia, lightheadedness and went to the emergency room, found to be in atrial fibrillation. She is given IV medication though she does not know the details. Converted back to normal sinus rhythm  Was previously on anticoagulation, pradaxa, later changed to high-dose aspirin. She has been maintained on multaq, previously on metoprolol which she liked better but was changed February 2016.  Hemoglobin A1c 6.9    Echocardiogram February 2016 was essentially normal, ejection fraction 60-65% Cardiac CTA with mild three-vessel CAD, ectatic ascending aorta measuring 3.72 cm  Total cholesterol 154, LDL 83  PFTs with mild obstructive lung disease  EKG on today's visit showing normal sinus rhythm with rate 75 bpm, no significant ST or T-wave changes   No Known Allergies  Current Outpatient Prescriptions on File  Prior to Visit  Medication Sig Dispense Refill  . [name] 50 MG tablet Take 50 mg by mouth at bedtime.    Marland Kitchen b complex vitamins capsule Take 1 capsule by mouth 2 (two) times daily.    . calcium carbonate (TUMS - DOSED IN MG ELEMENTAL CALCIUM) 500 MG chewable tablet Chew 1 tablet by mouth daily.    Marland Kitchen co-enzyme Q-10 50 MG capsule Take 50 mg by mouth daily.    Marland Kitchen docusate sodium (COLACE) 100 MG capsule Take 100 mg by mouth 2 (two) times daily.    Marland Kitchen ezetimibe (ZETIA) 10 MG tablet Take 10 mg by mouth daily.    Marland Kitchen glimepiride (AMARYL) 1 MG tablet Take 1 mg by mouth daily with breakfast.    . levothyroxine (SYNTHROID, LEVOTHROID) 88 MCG tablet Take 88 mcg by mouth daily before breakfast.    . Magnesium 500 MG CAPS Take 1 capsule by mouth 2 (two) times daily.    . Misc Natural Products (TUMERSAID PO) Take by mouth.    . Multiple Vitamins-Minerals (MULTIVITAMIN ADULT PO) Take 1 capsule by mouth daily.    . Omega-3 Fatty Acids (FISH OIL) 1200 MG CAPS Take 1 capsule by mouth daily after breakfast.    . omeprazole (PRILOSEC) 20 MG capsule Take 20 mg by mouth 2 (two) times daily before a meal.     . pravastatin (PRAVACHOL) 20 MG tablet Take 20 mg by mouth daily.    . predniSONE (DELTASONE) 20 MG tablet Take 2 tablets by mouth daily for 5 days. 10 tablet 0  . quinapril (ACCUPRIL) 20 MG tablet Take 20 mg by mouth at bedtime.    . sitaGLIPtin-metformin (JANUMET) 50-1000 MG per  tablet Take 1 tablet by mouth 2 (two) times daily with a meal.    . venlafaxine XR (EFFEXOR XR) 37.5 MG 24 hr capsule Take 1 capsule (37.5 mg total) by mouth daily with breakfast. 90 capsule 1  . verapamil (CALAN) 120 MG tablet Take 120 mg by mouth daily.      No current facility-administered medications on file prior to visit.    Past Medical History  Diagnosis Date  . GERD (gastroesophageal reflux disease)   . Hyperlipidemia   . Hypertension   . Diabetes mellitus without complication   . Thyroid disease   .  History of colonic polyps     benign  . Mitral valve prolapse   . Obstructive sleep apnea     on CPAP  . Ventricular tachycardia (paroxysmal)   . Lung infection   . Carotid artery occlusion     20 % left side    Past Surgical History  Procedure Laterality Date  . Knee arthroscopy  2012  . Clavicle surgery  2007    right clavicle plate pins  . Abdominal hysterectomy  2002    ovaries remain    Social History  reports that she has quit smoking. Her smoking use included Cigarettes. She quit after 20 years of use. She does not have any smokeless tobacco history on file. She reports that she does not drink alcohol or use illicit drugs.  Family History family history includes Arthritis in her father; Depression in her mother; Heart attack in her mother; Heart disease in her father; Hypertension in her father; Sudden death (age of onset: 19) in her mother.   Review of Systems  Constitutional: Negative.   Respiratory: Negative.   Cardiovascular: Negative.   Gastrointestinal: Negative.   Musculoskeletal: Negative.   Skin: Negative.   Neurological: Negative.   Hematological: Negative.   Psychiatric/Behavioral: Negative.   All other systems reviewed and are negative.   BP 124/74 mmHg  Pulse 75  Ht 5\' 6"  (1.676 m)  Wt 175 lb (79.379 kg)  BMI 28.26 kg/m2  Physical Exam  Constitutional: She is oriented to person, place, and time. She appears well-developed and well-nourished.  HENT:  Head: Normocephalic.  Nose: Nose normal.  Mouth/Throat: Oropharynx is clear and moist.  Eyes: Conjunctivae are normal. Pupils are equal, round, and reactive to light.  Neck: Normal range of motion. Neck supple. No JVD present.  Cardiovascular: Normal rate, regular rhythm, normal heart sounds and intact distal pulses.  Exam reveals no gallop and no friction rub.   No murmur heard. Pulmonary/Chest: Effort normal and breath sounds normal. No respiratory distress. She has no wheezes. She has no  rales. She exhibits no tenderness.  Abdominal: Soft. Bowel sounds are normal. She exhibits no distension. There is no tenderness.  Musculoskeletal: Normal range of motion. She exhibits no edema or tenderness.  Lymphadenopathy:    She has no cervical adenopathy.  Neurological: She is alert and oriented to person, place, and time. Coordination normal.  Skin: Skin is warm and dry. No rash noted. No erythema.  Psychiatric: She has a normal mood and affect. Her behavior is normal. Judgment and thought content normal.

## 2014-11-06 NOTE — Assessment & Plan Note (Signed)
Suggested she stay on her pravastatin and zetia daily. Goal LDL less than 70. Currently in the 80s Numbers should improve with weight loss

## 2014-11-06 NOTE — Assessment & Plan Note (Signed)
Recommended she start the metoprolol If palpitations get worse by holding multaq, perhaps alternate anti-rhythmic medication could be used History dates back 15 years per the patient. In the setting of normal ejection fraction, no significant coronary disease, likely of little clinical significance

## 2014-11-25 ENCOUNTER — Other Ambulatory Visit: Payer: Self-pay | Admitting: Primary Care

## 2014-11-25 ENCOUNTER — Telehealth: Payer: Self-pay | Admitting: Primary Care

## 2014-11-25 DIAGNOSIS — Z1239 Encounter for other screening for malignant neoplasm of breast: Secondary | ICD-10-CM

## 2014-11-25 DIAGNOSIS — Z13820 Encounter for screening for osteoporosis: Secondary | ICD-10-CM

## 2014-11-25 NOTE — Telephone Encounter (Signed)
Orders placed.

## 2014-11-25 NOTE — Telephone Encounter (Signed)
Pt called. Needs order for screening MM and Dexa. She is having no issues with her breast. She has never had a bone density. Last MM in [place]. She would like to go to [facility]. Anda Kraft can you please enter orders?  Shirlean Mylar, pt would just like a call when the orders are put in and she will call herself. I forgot to give her the scheduling # to [place]. Please advise  [phone]

## 2014-11-26 ENCOUNTER — Other Ambulatory Visit: Payer: Self-pay | Admitting: Primary Care

## 2014-11-26 DIAGNOSIS — E2839 Other primary ovarian failure: Secondary | ICD-10-CM

## 2014-11-26 NOTE — Telephone Encounter (Signed)
Pt aware.

## 2015-01-07 ENCOUNTER — Ambulatory Visit
Admission: RE | Admit: 2015-01-07 | Discharge: 2015-01-07 | Disposition: A | Discharge status: Home / Self Care | Payer: Managed Care, Other (non HMO) | Source: Ambulatory Visit | Attending: Primary Care | Admitting: Primary Care

## 2015-01-07 DIAGNOSIS — E2839 Other primary ovarian failure: Secondary | ICD-10-CM

## 2015-01-07 DIAGNOSIS — Z1239 Encounter for other screening for malignant neoplasm of breast: Secondary | ICD-10-CM

## 2015-01-16 ENCOUNTER — Telehealth: Payer: Self-pay | Admitting: Cardiovascular Disease

## 2015-01-16 MED ORDER — PROPAFENONE HCL ER 225 MG PO CP12: 225.0000 mg | ORAL_CAPSULE | Freq: Two times a day (BID) | ORAL | Status: DC

## 2015-01-16 NOTE — Telephone Encounter (Signed)
Spoke w/ pt. At last ov 11/06/14, she was advised to :  "Please hold the multaq Please start metoprolol 25 mg twice a day"  She reports that she felt good after stopping multaq, had one episode if afib that lasted > 1 hr. Last night, she woke up in the middle of the night, felt that she was in afib, HR in the 90s. Episode lasted about 5 hours, she took her metoprolol and laid down until it resolved.  Pt feels better today.  Advised her to call me if she experiences sx again to come in for EKG.  Pt states that overall, she feels that the metoprolol is working for her, but she is concerned that it interfering w/ her efforts to lose wt.  Pt is 5'6" and currently weighs 175. She would like to know if there is another class of medication that she can try that won't "slow everything down".  Advised her that I will make Dr. Rockey Situ aware and call her back w/ his recommendation.

## 2015-01-16 NOTE — Telephone Encounter (Signed)
One option would be to stop the metoprolol Stay on verapamil Would start antiarrhythmic medication such as Rythmol Would start low-dose 225 mg SR dose twice a day Would check the price first There are other medications in the antiarrhythmic group as well

## 2015-01-16 NOTE — Telephone Encounter (Signed)
Spoke w/ pt.  Advised her of Dr. Donivan Scull recommendation.  She states that she has very good ins and the price should not be a problem. She is very appreciative and will call back w/ any questions or concerns.

## 2015-01-16 NOTE — Telephone Encounter (Signed)
Patient says she has been having interim episodes of Afib with tiredness and some dizziness .  Scheduled with [place] first available 02-02-15 at 7:40 am.

## 2015-01-20 ENCOUNTER — Other Ambulatory Visit: Payer: Self-pay | Admitting: Internal Medicine

## 2015-01-20 DIAGNOSIS — E119 Type 2 diabetes mellitus without complications: Secondary | ICD-10-CM

## 2015-01-27 ENCOUNTER — Other Ambulatory Visit (INDEPENDENT_AMBULATORY_CARE_PROVIDER_SITE_OTHER): Payer: Managed Care, Other (non HMO)

## 2015-01-27 DIAGNOSIS — E119 Type 2 diabetes mellitus without complications: Secondary | ICD-10-CM | POA: Diagnosis not present

## 2015-01-27 LAB — HEMOGLOBIN A1C: Hgb A1c MFr Bld: 7.1 % — ABNORMAL HIGH (ref 4.6–6.5)

## 2015-02-02 ENCOUNTER — Ambulatory Visit (INDEPENDENT_AMBULATORY_CARE_PROVIDER_SITE_OTHER): Payer: Managed Care, Other (non HMO) | Admitting: Cardiovascular Disease

## 2015-02-02 ENCOUNTER — Encounter: Payer: Self-pay | Admitting: Cardiovascular Disease

## 2015-02-02 VITALS — BP 145/83 | HR 80 | Ht 66.0 in | Wt 174.8 lb

## 2015-02-02 DIAGNOSIS — E785 Hyperlipidemia, unspecified: Secondary | ICD-10-CM

## 2015-02-02 DIAGNOSIS — I480 Paroxysmal atrial fibrillation: Secondary | ICD-10-CM | POA: Diagnosis not present

## 2015-02-02 DIAGNOSIS — I4729 Other ventricular tachycardia: Secondary | ICD-10-CM

## 2015-02-02 DIAGNOSIS — I10 Essential (primary) hypertension: Secondary | ICD-10-CM | POA: Diagnosis not present

## 2015-02-02 DIAGNOSIS — E119 Type 2 diabetes mellitus without complications: Secondary | ICD-10-CM

## 2015-02-02 DIAGNOSIS — I472 Ventricular tachycardia: Secondary | ICD-10-CM

## 2015-02-02 MED ORDER — QUINAPRIL HCL 20 MG PO TABS: 20.0000 mg | ORAL_TABLET | Freq: Every day | ORAL | Status: DC

## 2015-02-02 MED ORDER — PRAVASTATIN SODIUM 20 MG PO TABS: 20.0000 mg | ORAL_TABLET | Freq: Every day | ORAL | Status: DC

## 2015-02-02 MED ORDER — PROPAFENONE HCL ER 225 MG PO CP12: 225.0000 mg | ORAL_CAPSULE | Freq: Two times a day (BID) | ORAL | Status: DC

## 2015-02-02 MED ORDER — VERAPAMIL HCL ER 120 MG PO TBCR: 120.0000 mg | EXTENDED_RELEASE_TABLET | Freq: Every day | ORAL | Status: DC

## 2015-02-02 MED ORDER — EZETIMIBE 10 MG PO TABS: 10.0000 mg | ORAL_TABLET | Freq: Every day | ORAL | Status: DC

## 2015-02-02 MED ORDER — VERAPAMIL HCL 120 MG PO TABS: 120.0000 mg | ORAL_TABLET | Freq: Every day | ORAL | Status: DC

## 2015-02-02 NOTE — Patient Instructions (Signed)
You are doing well. No medication changes were made.  Take metoprolol as needed for palpitations concerning for atrial fibrillation Take eliquis for atrial fib lasting a few hours  Please call us if you have new issues that need to be addressed before your next appt.  Your physician wants you to follow-up in: 6 months.  You will receive a reminder letter in the mail two months in advance. If you don't receive a letter, please call our office to schedule the follow-up appointment.

## 2015-02-02 NOTE — Assessment & Plan Note (Signed)
Hemoglobin A1c 7.1, trending upwards. Recommended she avoid carbohydrates  Starting regular walking program

## 2015-02-02 NOTE — Assessment & Plan Note (Signed)
She denies significant symptoms concerning for VT, no significant strong palpitations We'll continue current medication regimen

## 2015-02-02 NOTE — Assessment & Plan Note (Signed)
Blood pressure is well controlled on today's visit. No changes made to the medications. 

## 2015-02-02 NOTE — Assessment & Plan Note (Signed)
Recommend she continue her current regimen. Weight loss regiment for diabetes

## 2015-02-02 NOTE — Progress Notes (Signed)
Patient ID: Debra Perez, female    DOB: 03/03/1956, 59 y.o.   MRN: [id number]  HPI Comments: Debra Perez is a pleasant 59 year old woman with history of nonsustained VT/palpitations, normal ejection fraction with EF greater than 60%, mild three-vessel coronary artery disease on her nares CTA scan, 20 years of smoking who stopped 13 years ago, recent upper respiratory infection February 2016 who developed atrial fibrillation after inhaling albuterol while on prednisone, evaluated in the hospital at that time with arrhythmia lasting for hours before converting back to normal sinus rhythm, presents for follow-up of her atrial fibrillation History ofructive sleep apnea, on CPAP  In follow-up, she had atrial fibrillation 01/16/2015 lasting at least 5 hours Possibly also 2 other very short episodes We've recommended she change the metoprolol as she was having side effects of fatigue She started Rythmol 225 mg twice a day, SR dose On this regimen she has had no significant breakthrough palpitations or arrhythmia She denies having any nonsustained VT, strong palpitations described as a strong flutter Overall is very happy. Hemoglobin A1c up to 7.1 over she is trying to watch her diet  Other past medical history  She moved to the area from [place] when her husband changed jobs. She likes the area and has not had any significant problems since her arrival. In [place], she developed a severe upper respiratory infection, failed outpatient ABX. At home she was using Solu-Medrol, albuterol. Shortly after using the albuterol, she developed tachycardia, lightheadedness and went to the emergency room, found to be in atrial fibrillation. She is given IV medication though she does not know the details. Converted back to normal sinus rhythm  Was previously on anticoagulation, pradaxa, later changed to high-dose aspirin. She has been maintained on multaq, previously on metoprolol which she liked better but was  changed February 2016.  Hemoglobin A1c 6.9  now up to 7.1   Echocardiogram February 2016 was essentially normal, ejection fraction 60-65% Cardiac CTA with mild three-vessel CAD, ectatic ascending aorta measuring 3.72 cm  Total cholesterol 154, LDL 83  PFTs with mild obstructive lung disease   previous EKG on showing normal sinus rhythm with rate 75 bpm, no significant ST or T-wave changes   No Known Allergies  Current Outpatient Prescriptions on File Prior to Visit  Medication Sig Dispense Refill  . [name] 50 MG tablet Take 50 mg by mouth at bedtime.    Marland Kitchen aspirin 81 MG tablet Take 1 tablet (81 mg total) by mouth daily.    Marland Kitchen b complex vitamins capsule Take 1 capsule by mouth 2 (two) times daily.    . calcium carbonate (TUMS - DOSED IN MG ELEMENTAL CALCIUM) 500 MG chewable tablet Chew 1 tablet by mouth daily.    Marland Kitchen co-enzyme Q-10 50 MG capsule Take 50 mg by mouth daily.    Marland Kitchen docusate sodium (COLACE) 100 MG capsule Take 100 mg by mouth 2 (two) times daily.    Marland Kitchen glimepiride (AMARYL) 1 MG tablet Take 1 mg by mouth daily with breakfast.    . levothyroxine (SYNTHROID, LEVOTHROID) 88 MCG tablet Take 88 mcg by mouth daily before breakfast.    . Magnesium 500 MG CAPS Take 1 capsule by mouth 2 (two) times daily.    . Misc Natural Products (TUMERSAID PO) Take by mouth.    . Multiple Vitamins-Minerals (MULTIVITAMIN ADULT PO) Take 1 capsule by mouth daily.    . Omega-3 Fatty Acids (FISH OIL) 1200 MG CAPS Take 1 capsule by mouth daily after breakfast.    .  omeprazole (PRILOSEC) 20 MG capsule Take 20 mg by mouth 2 (two) times daily before a meal.     . sitaGLIPtin-metformin (JANUMET) 50-1000 MG per tablet Take 1 tablet by mouth 2 (two) times daily with a meal.    . venlafaxine XR (EFFEXOR XR) 37.5 MG 24 hr capsule Take 1 capsule (37.5 mg total) by mouth daily with breakfast. 90 capsule 1   No current facility-administered medications on file prior to visit.    Past Medical History   Diagnosis Date  . GERD (gastroesophageal reflux disease)   . Hyperlipidemia   . Hypertension   . Diabetes mellitus without complication ([place])   . Thyroid disease   . History of colonic polyps     benign  . Mitral valve prolapse   . Obstructive sleep apnea     on CPAP  . Ventricular tachycardia (paroxysmal) ([place])   . Lung infection   . Carotid artery occlusion     20 % left side    Past Surgical History  Procedure Laterality Date  . Knee arthroscopy  2012  . Clavicle surgery  2007    right clavicle plate pins  . Abdominal hysterectomy  2002    ovaries remain    Social History  reports that she has quit smoking. Her smoking use included Cigarettes. She quit after 20 years of use. She does not have any smokeless tobacco history on file. She reports that she does not drink alcohol or use illicit drugs.  Family History family history includes Arthritis in her father; Depression in her mother; Heart attack in her mother; Heart disease in her father; Hypertension in her father; Sudden death (age of onset: 77) in her mother.   Review of Systems  Constitutional: Negative.   Respiratory: Negative.   Cardiovascular: Negative.   Gastrointestinal: Negative.   Musculoskeletal: Negative.   Skin: Negative.   Neurological: Negative.   Hematological: Negative.   Psychiatric/Behavioral: Negative.   All other systems reviewed and are negative.   BP 145/83 mmHg  Pulse 80  Ht 5\' 6"  (1.676 m)  Wt 174 lb 12.8 oz (79.289 kg)  BMI 28.23 kg/m2  Physical Exam  Constitutional: She is oriented to person, place, and time. She appears well-developed and well-nourished.  HENT:  Head: Normocephalic.  Nose: Nose normal.  Mouth/Throat: Oropharynx is clear and moist.  Eyes: Conjunctivae are normal. Pupils are equal, round, and reactive to light.  Neck: Normal range of motion. Neck supple. No JVD present.  Cardiovascular: Normal rate, regular rhythm, normal heart sounds and intact distal  pulses.  Exam reveals no gallop and no friction rub.   No murmur heard. Pulmonary/Chest: Effort normal and breath sounds normal. No respiratory distress. She has no wheezes. She has no rales. She exhibits no tenderness.  Abdominal: Soft. Bowel sounds are normal. She exhibits no distension. There is no tenderness.  Musculoskeletal: Normal range of motion. She exhibits no edema or tenderness.  Lymphadenopathy:    She has no cervical adenopathy.  Neurological: She is alert and oriented to person, place, and time. Coordination normal.  Skin: Skin is warm and dry. No rash noted. No erythema.  Psychiatric: She has a normal mood and affect. Her behavior is normal. Judgment and thought content normal.

## 2015-02-02 NOTE — Assessment & Plan Note (Signed)
No significant episodes of atrial fibrillation since starting Rythmol SR 225 mg twice a day Had significant side effects on metoprolol, fatigue, felt like she was slowing down to much We have given her eliquis 5 mg to take for episodes She reports that she is very symptomatic, able to appreciate her atrial fibrillation and does not want to be on anticoagulation on a regular basis Currently palpitation burden is very low, has not appreciated any on the Rythmol Recommend she call us for her atrial fibrillation episodes

## 2015-02-02 NOTE — Addendum Note (Signed)
Addended by: Dede Query R on: 02/02/2015 08:27 AM   Modules accepted: Orders

## 2015-02-04 ENCOUNTER — Encounter: Payer: Self-pay | Admitting: Primary Care

## 2015-02-04 ENCOUNTER — Ambulatory Visit (INDEPENDENT_AMBULATORY_CARE_PROVIDER_SITE_OTHER): Payer: Managed Care, Other (non HMO) | Admitting: Primary Care

## 2015-02-04 VITALS — BP 124/84 | HR 76 | Temp 98.1°F | Ht 66.0 in | Wt 173.4 lb

## 2015-02-04 DIAGNOSIS — M1990 Unspecified osteoarthritis, unspecified site: Secondary | ICD-10-CM | POA: Diagnosis not present

## 2015-02-04 DIAGNOSIS — E119 Type 2 diabetes mellitus without complications: Secondary | ICD-10-CM

## 2015-02-04 MED ORDER — OMEPRAZOLE 20 MG PO CPDR: 20.0000 mg | DELAYED_RELEASE_CAPSULE | Freq: Two times a day (BID) | ORAL | Status: DC

## 2015-02-04 MED ORDER — LEVOTHYROXINE SODIUM 88 MCG PO TABS
88.0000 ug | ORAL_TABLET | Freq: Every day | ORAL | Status: DC
Start: 2015-02-04 — End: 2016-02-19

## 2015-02-04 MED ORDER — MELOXICAM 15 MG PO TABS: 15.0000 mg | ORAL_TABLET | Freq: Every day | ORAL | Status: DC

## 2015-02-04 MED ORDER — GLUCOSE BLOOD VI STRP: ORAL_STRIP | Status: DC

## 2015-02-04 MED ORDER — AMITRIPTYLINE HCL 50 MG PO TABS: 50.0000 mg | ORAL_TABLET | Freq: Every day | ORAL | Status: DC

## 2015-02-04 NOTE — Progress Notes (Signed)
Pre visit review using our clinic review tool, if applicable. No additional management support is needed unless otherwise documented below in the visit note. 

## 2015-02-04 NOTE — Progress Notes (Signed)
Subjective:    Patient ID: Debra Perez, female    DOB: 1955-05-02, 59 y.o.   MRN: [id number]  HPI  Debra Perez is a 59 year old female who presents today for follow up of diabetes. She is currently managed on glimepiride 1 mg at night and janumet 50/1000 mg twice daily. Her last A1C was 6.9 in August 2016 and has increased to 7.1 as of November 2016.  Her blood sugars in the morning before breakfast run between 96-118. She's noticed that her blood sugars will reduce down to 55 at about 11 am 2-3 times weekly.   She's been eating an increased amount of pastas and breads recently. She once trailed the nurtisystem but is no longer participating due to cost.  Her diet currently consists of:  Breakfast: Oatmeal and fruit Lunch: Cheese and crackers, hummus and pita Dinner: Pasta, salad Snacks: Carrots Desserts: Ice cream once to twice weekly Beverages: Water and unsweet tea  Exercise: She is currently not exercising.  Wt Readings from Last 3 Encounters:  02/04/15 173 lb 6.4 oz (78.654 kg)  02/02/15 174 lb 12.8 oz (79.289 kg)  11/06/14 175 lb (79.379 kg)     Review of Systems  Respiratory: Negative for shortness of breath.   Cardiovascular: Negative for chest pain.  Neurological: Negative for dizziness and numbness.       Past Medical History  Diagnosis Date  . GERD (gastroesophageal reflux disease)   . Hyperlipidemia   . Hypertension   . Diabetes mellitus without complication ([place])   . Thyroid disease   . History of colonic polyps     benign  . Mitral valve prolapse   . Obstructive sleep apnea     on CPAP  . Ventricular tachycardia (paroxysmal) ([place])   . Lung infection   . 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 Marital Status: Married    Spouse Name: N/A  . Number of Children: N/A  . Years of Education: N/A   Occupational History  . Not on file.   Social History Main Topics  . Smoking status: Former Smoker  -- 20 years    Types: Cigarettes  . Smokeless tobacco: Not on file  . Alcohol Use: No  . Drug Use: No  . Sexual Activity: Not on file   Other Topics Concern  . Not on file   Social History Narrative   Married.   1 daughter.   Moved here from [place] due to husbands occupation.   She is a Corporate treasurer.   Enjoys sewing, gardening.    Past Surgical History  Procedure Laterality Date  . Knee arthroscopy  2012  . Clavicle surgery  2007    right clavicle plate pins  . Abdominal hysterectomy  2002    ovaries remain    Family History  Problem Relation Age of Onset  . Depression Mother   . Sudden death Mother 25  . Heart attack Mother   . Arthritis Father   . Heart disease Father   . Hypertension Father     No Known Allergies  Current Outpatient Prescriptions on File Prior to Visit  Medication Sig Dispense Refill  . aspirin 81 MG tablet Take 1 tablet (81 mg total) by mouth daily.    Marland Kitchen b complex vitamins capsule Take 1 capsule by mouth 2 (two) times daily.    Marland Kitchen co-enzyme Q-10 50 MG capsule Take 50 mg by mouth  daily.    . docusate sodium (COLACE) 100 MG capsule Take 100 mg by mouth 2 (two) times daily.    Marland Kitchen ezetimibe (ZETIA) 10 MG tablet Take 1 tablet (10 mg total) by mouth daily. 90 tablet 3  . glimepiride (AMARYL) 1 MG tablet Take 1 mg by mouth daily with breakfast.    . Magnesium 500 MG CAPS Take 1 capsule by mouth 2 (two) times daily.    . Multiple Vitamins-Minerals (MULTIVITAMIN ADULT PO) Take 1 capsule by mouth daily.    . Omega-3 Fatty Acids (FISH OIL) 1200 MG CAPS Take 1 capsule by mouth daily after breakfast.    . pravastatin (PRAVACHOL) 20 MG tablet Take 1 tablet (20 mg total) by mouth daily. 90 tablet 3  . propafenone (RYTHMOL SR) 225 MG 12 hr capsule Take 1 capsule (225 mg total) by mouth 2 (two) times daily. 180 capsule 3  . quinapril (ACCUPRIL) 20 MG tablet Take 1 tablet (20 mg total) by mouth at bedtime. 90 tablet 3  . sitaGLIPtin-metformin (JANUMET) 50-1000 MG per  tablet Take 1 tablet by mouth 2 (two) times daily with a meal.    . venlafaxine XR (EFFEXOR XR) 37.5 MG 24 hr capsule Take 1 capsule (37.5 mg total) by mouth daily with breakfast. 90 capsule 1  . verapamil (CALAN-SR) 120 MG CR tablet Take 1 tablet (120 mg total) by mouth at bedtime. 90 tablet 3   No current facility-administered medications on file prior to visit.    BP 124/84 mmHg  Pulse 76  Temp(Src) 98.1 F (36.7 C) (Oral)  Ht 5\' 6"  (1.676 m)  Wt 173 lb 6.4 oz (78.654 kg)  BMI 28.00 kg/m2  SpO2 92%    Objective:   Physical Exam  Constitutional: She appears well-nourished.  Cardiovascular: Normal rate and regular rhythm.   Pulmonary/Chest: Effort normal and breath sounds normal.  Skin: Skin is warm and dry.          Assessment & Plan:

## 2015-02-04 NOTE — Assessment & Plan Note (Addendum)
A1C slightly worse at 7.1. Currently managed on glimepiride 1 mg and janumet 50/1000 BID. Sugars are dropping at about 11 am 2-3 times weekly which will cause her to ingest candy bars/sweets which is ultimately counterproductive and dangerous.  Will remove glimepiride, continue janumet 50/1000 BID. This may or may not increase A1C, but thorough education provided regarding proper diabetic diet. She will work to improve her diet and will follow up in 3 months. She is to notify me if her sugars continue to drop.

## 2015-02-04 NOTE — Patient Instructions (Signed)
Stop Glimiperide 1 mg.  Continue Janumet 50/1000 mg twice daily.  You MUST work to [employer] consumption of breads, pastas and sweets.  Follow up in 3 months for reevaluation. Schedule your lab appointment at least 2 days prior, no sooner than March 3rd 2017.  It was a pleasure to see you today!

## 2015-02-25 ENCOUNTER — Telehealth: Payer: Self-pay

## 2015-02-25 NOTE — Telephone Encounter (Signed)
Pt wanted to ck on status of cpap supplies requested by [employer] few weeks ago; do not see request from [place]; pt will contact [facility] and have them fax request to Gentry Fitz NP. cpap supplies initially prescribed by provider in [facility]. FYI for [name] NP.

## 2015-04-06 ENCOUNTER — Encounter: Payer: Self-pay | Admitting: Primary Care

## 2015-04-06 ENCOUNTER — Ambulatory Visit (INDEPENDENT_AMBULATORY_CARE_PROVIDER_SITE_OTHER): Payer: Managed Care, Other (non HMO) | Admitting: Primary Care

## 2015-04-06 VITALS — BP 124/80 | HR 84 | Temp 98.0°F | Ht 66.0 in | Wt 171.1 lb

## 2015-04-06 DIAGNOSIS — R05 Cough: Secondary | ICD-10-CM | POA: Diagnosis not present

## 2015-04-06 DIAGNOSIS — E119 Type 2 diabetes mellitus without complications: Secondary | ICD-10-CM | POA: Diagnosis not present

## 2015-04-06 DIAGNOSIS — R059 Cough, unspecified: Secondary | ICD-10-CM

## 2015-04-06 MED ORDER — AMOXICILLIN 500 MG PO CAPS: 500.0000 mg | ORAL_CAPSULE | Freq: Two times a day (BID) | ORAL | Status: DC

## 2015-04-06 NOTE — Progress Notes (Signed)
Subjective:    Patient ID: Debra Perez, female    DOB: 03-30-1955, 60 y.o.   MRN: [id number]  HPI  Debra Perez is a 60 year old female who presents today with a chief complaint of cough. She also reports headache, fatigue, chills, postnasal drip. Her symptoms have been present for the past 2 weeks. Her cough is productive with green sputum. She's used Flonase and Zyrtec OTC with temporary improvement.  Denies fevers, nausea, vomiting. Her husband is being treated for the same symptoms.  Review of Systems  Constitutional: Positive for chills and fatigue. Negative for fever.  HENT: Positive for congestion, postnasal drip and sinus pressure.   Respiratory: Positive for cough. Negative for shortness of breath and wheezing.   Cardiovascular: Negative for chest pain.       Past Medical History  Diagnosis Date  . GERD (gastroesophageal reflux disease)   . Hyperlipidemia   . Hypertension   . Diabetes mellitus without complication ([place])   . Thyroid disease   . History of colonic polyps     benign  . Mitral valve prolapse   . Obstructive sleep apnea     on CPAP  . Ventricular tachycardia (paroxysmal) ([place])   . Lung infection   . 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 Marital Status: Married    Spouse Name: N/A  . Number of Children: N/A  . Years of Education: N/A   Occupational History  . Not on file.   Social History Main Topics  . Smoking status: Former Smoker -- 20 years    Types: Cigarettes  . Smokeless tobacco: Not on file  . Alcohol Use: No  . Drug Use: No  . Sexual Activity: Not on file   Other Topics Concern  . Not on file   Social History Narrative   Married.   1 daughter.   Moved here from [place] due to husbands occupation.   She is a Corporate treasurer.   Enjoys sewing, gardening.    Past Surgical History  Procedure Laterality Date  . Knee arthroscopy  2012  . Clavicle surgery  2007    right clavicle plate pins    . Abdominal hysterectomy  2002    ovaries remain    Family History  Problem Relation Age of Onset  . Depression Mother   . Sudden death Mother 11  . Heart attack Mother   . Arthritis Father   . Heart disease Father   . Hypertension Father     No Known Allergies  Current Outpatient Prescriptions on File Prior to Visit  Medication Sig Dispense Refill  . [name] 50 MG tablet Take 1 tablet (50 mg total) by mouth at bedtime. 90 tablet 3  . aspirin 81 MG tablet Take 1 tablet (81 mg total) by mouth daily.    Marland Kitchen b complex vitamins capsule Take 1 capsule by mouth 2 (two) times daily.    Marland Kitchen co-enzyme Q-10 50 MG capsule Take 50 mg by mouth daily.    Marland Kitchen docusate sodium (COLACE) 100 MG capsule Take 100 mg by mouth 2 (two) times daily.    Marland Kitchen ezetimibe (ZETIA) 10 MG tablet Take 1 tablet (10 mg total) by mouth daily. 90 tablet 3  . glimepiride (AMARYL) 1 MG tablet Take 1 mg by mouth daily with breakfast.    . glucose blood (ACCU-CHEK AVIVA PLUS) test strip Test glucose blood 1 to 2 times  daily. 100 each 12  . levothyroxine (SYNTHROID, LEVOTHROID) 88 MCG tablet Take 1 tablet (88 mcg total) by mouth daily before breakfast. 90 tablet 3  . Magnesium 500 MG CAPS Take 1 capsule by mouth 2 (two) times daily.    . meloxicam (MOBIC) 15 MG tablet Take 1 tablet (15 mg total) by mouth daily. 30 tablet 2  . Multiple Vitamins-Minerals (MULTIVITAMIN ADULT PO) Take 1 capsule by mouth daily.    . Omega-3 Fatty Acids (FISH OIL) 1200 MG CAPS Take 1 capsule by mouth daily after breakfast.    . omeprazole (PRILOSEC) 20 MG capsule Take 1 capsule (20 mg total) by mouth 2 (two) times daily before a meal. 180 capsule 3  . pravastatin (PRAVACHOL) 20 MG tablet Take 1 tablet (20 mg total) by mouth daily. 90 tablet 3  . propafenone (RYTHMOL SR) 225 MG 12 hr capsule Take 1 capsule (225 mg total) by mouth 2 (two) times daily. 180 capsule 3  . quinapril (ACCUPRIL) 20 MG tablet Take 1 tablet (20 mg total) by mouth at  bedtime. 90 tablet 3  . sitaGLIPtin-metformin (JANUMET) 50-1000 MG per tablet Take 1 tablet by mouth 2 (two) times daily with a meal.    . venlafaxine XR (EFFEXOR XR) 37.5 MG 24 hr capsule Take 1 capsule (37.5 mg total) by mouth daily with breakfast. 90 capsule 1  . verapamil (CALAN-SR) 120 MG CR tablet Take 1 tablet (120 mg total) by mouth at bedtime. 90 tablet 3   No current facility-administered medications on file prior to visit.    BP 124/80 mmHg  Pulse 84  Temp(Src) 98 F (36.7 C) (Oral)  Ht 5\' 6"  (1.676 m)  Wt 171 lb 1.9 oz (77.62 kg)  BMI 27.63 kg/m2  SpO2 94%    Objective:   Physical Exam  Constitutional: She appears well-nourished.  HENT:  Right Ear: Tympanic membrane and ear canal normal.  Left Ear: Tympanic membrane and ear canal normal.  Nose: Right sinus exhibits maxillary sinus tenderness. Left sinus exhibits maxillary sinus tenderness.  Mouth/Throat: Oropharynx is clear and moist.  Eyes: Conjunctivae are normal.  Neck: Neck supple.  Cardiovascular: Normal rate and regular rhythm.   Pulmonary/Chest: She has no wheezes. She has rhonchi in the right upper field and the left upper field.  Lymphadenopathy:    She has no cervical adenopathy.  Skin: Skin is warm and dry.          Assessment & Plan:  URI:  Cough, green sputum, fatigue x 2 weeks. Slightly improved for several days, now worse. Temporary improvement with OTC treatment. Exam with rhonchi to upper fields. No wheezing. Due to sick husband at home, duration of symptoms, and exam, will treat for presumed bacterial involvement. RX for Amoxicillin course, tessalon pearls. Continue flonase. Fluids, rest return precautions provided.

## 2015-04-06 NOTE — Progress Notes (Signed)
Pre visit review using our clinic review tool, if applicable. No additional management support is needed unless otherwise documented below in the visit note. 

## 2015-04-06 NOTE — Patient Instructions (Signed)
Start amoxicillin antibiotic for acute bronchitis. Take 1 tablet by mouth twice daily for 7 days.  You may take Benzonatate capsules for cough. Take 1 capsule by mouth three times daily as needed for cough.  Continue Flonase as needed.  Increase consumption of fluids and rest.  It was a pleasure to see you today!  Acute Bronchitis Bronchitis is inflammation of the airways that extend from the windpipe into the lungs (bronchi). The inflammation often causes mucus to develop. This leads to a cough, which is the most common symptom of bronchitis.  In acute bronchitis, the condition usually develops suddenly and goes away over time, usually in a couple weeks. Smoking, allergies, and asthma can make bronchitis worse. Repeated episodes of bronchitis may cause further lung problems.  CAUSES Acute bronchitis is most often caused by the same virus that causes a cold. The virus can spread from person to person (contagious) through coughing, sneezing, and touching contaminated objects. SIGNS AND SYMPTOMS   Cough.   Fever.   Coughing up mucus.   Body aches.   Chest congestion.   Chills.   Shortness of breath.   Sore throat.  DIAGNOSIS  Acute bronchitis is usually diagnosed through a physical exam. Your health care provider will also ask you questions about your medical history. Tests, such as chest X-rays, are sometimes done to rule out other conditions.  TREATMENT  Acute bronchitis usually goes away in a couple weeks. Oftentimes, no medical treatment is necessary. Medicines are sometimes given for relief of fever or cough. Antibiotic medicines are usually not needed but may be prescribed in certain situations. In some cases, an inhaler may be recommended to help reduce shortness of breath and control the cough. A cool mist vaporizer may also be used to help thin bronchial secretions and make it easier to clear the chest.  HOME CARE INSTRUCTIONS  Get plenty of rest.   Drink  enough fluids to keep your urine clear or pale yellow (unless you have a medical condition that requires fluid restriction). Increasing fluids may help thin your respiratory secretions (sputum) and reduce chest congestion, and it will prevent dehydration.   Take medicines only as directed by your health care provider.  If you were prescribed an antibiotic medicine, finish it all even if you start to feel better.  Avoid smoking and secondhand smoke. Exposure to cigarette smoke or irritating chemicals will make bronchitis worse. If you are a smoker, consider using nicotine gum or skin patches to help control withdrawal symptoms. Quitting smoking will help your lungs heal faster.   Reduce the chances of another bout of acute bronchitis by washing your hands frequently, avoiding people with cold symptoms, and trying not to touch your hands to your mouth, nose, or eyes.   Keep all follow-up visits as directed by your health care provider.  SEEK MEDICAL CARE IF: Your symptoms do not improve after 1 week of treatment.  SEEK IMMEDIATE MEDICAL CARE IF:  You develop an increased fever or chills.   You have chest pain.   You have severe shortness of breath.  You have bloody sputum.   You develop dehydration.  You faint or repeatedly feel like you are going to pass out.  You develop repeated vomiting.  You develop a severe headache. MAKE SURE YOU:   Understand these instructions.  Will watch your condition.  Will get help right away if you are not doing well or get worse.   This information is not intended to replace advice  given to you by your health care provider. Make sure you discuss any questions you have with your health care provider.   Document Released: 03/31/2004 Document Revised: 03/14/2014 Document Reviewed: 08/14/2012 Elsevier Interactive Patient Education [employer].

## 2015-04-17 ENCOUNTER — Other Ambulatory Visit: Payer: Self-pay | Admitting: Primary Care

## 2015-04-17 DIAGNOSIS — E119 Type 2 diabetes mellitus without complications: Secondary | ICD-10-CM

## 2015-04-17 DIAGNOSIS — D649 Anemia, unspecified: Secondary | ICD-10-CM

## 2015-04-22 ENCOUNTER — Other Ambulatory Visit (INDEPENDENT_AMBULATORY_CARE_PROVIDER_SITE_OTHER): Payer: Managed Care, Other (non HMO)

## 2015-04-22 DIAGNOSIS — E119 Type 2 diabetes mellitus without complications: Secondary | ICD-10-CM | POA: Diagnosis not present

## 2015-04-22 DIAGNOSIS — D649 Anemia, unspecified: Secondary | ICD-10-CM | POA: Diagnosis not present

## 2015-04-22 LAB — CBC
HEMATOCRIT: 32.5 % — AB (ref 36.0–46.0)
Hemoglobin: 10.4 g/dL — ABNORMAL LOW (ref 12.0–15.0)
MCHC: 32.1 g/dL (ref 30.0–36.0)
MCV: 75.5 fl — AB (ref 78.0–100.0)
PLATELETS: 360 10*3/uL (ref 150.0–400.0)
RBC: 4.31 Mil/uL (ref 3.87–5.11)
RDW: 19.8 % — ABNORMAL HIGH (ref 11.5–15.5)
WBC: 6.2 10*3/uL (ref 4.0–10.5)

## 2015-04-22 LAB — HEMOGLOBIN A1C: Hgb A1c MFr Bld: 7.1 % — ABNORMAL HIGH (ref 4.6–6.5)

## 2015-04-29 ENCOUNTER — Ambulatory Visit (INDEPENDENT_AMBULATORY_CARE_PROVIDER_SITE_OTHER): Payer: Managed Care, Other (non HMO) | Admitting: Primary Care

## 2015-04-29 ENCOUNTER — Encounter: Payer: Self-pay | Admitting: Primary Care

## 2015-04-29 VITALS — BP 136/84 | HR 68 | Temp 97.6°F | Ht 66.0 in | Wt 171.8 lb

## 2015-04-29 DIAGNOSIS — E119 Type 2 diabetes mellitus without complications: Secondary | ICD-10-CM

## 2015-04-29 DIAGNOSIS — I480 Paroxysmal atrial fibrillation: Secondary | ICD-10-CM

## 2015-04-29 DIAGNOSIS — E785 Hyperlipidemia, unspecified: Secondary | ICD-10-CM

## 2015-04-29 DIAGNOSIS — N951 Menopausal and female climacteric states: Secondary | ICD-10-CM

## 2015-04-29 DIAGNOSIS — I10 Essential (primary) hypertension: Secondary | ICD-10-CM | POA: Diagnosis not present

## 2015-04-29 DIAGNOSIS — R232 Flushing: Secondary | ICD-10-CM

## 2015-04-29 NOTE — Assessment & Plan Note (Signed)
Continues to feel well managed on Effexor.

## 2015-04-29 NOTE — Progress Notes (Signed)
Subjective:    Patient ID: Debra Perez, female    DOB: 1956/01/28, 60 y.o.   MRN: [id number]  HPI  Debra Perez is a 60 year old female who presents today for follow up.  1) Type 2 Diabetes: A1C of 7.1 from recent labs which is unchanged from her prior A1C 4 months ago. She is currently managed on Janumet 50/1000 mg BID. She was once also on glimiperide 1 mg which was dropping her sugars and causing her to ingest candy. She has an appointment with the diabetic nutritionist this coming Friday and is committed to improving her diet.   2) Essential Hypertension: Currently managed on quinapril 20 mg and verapamil 120 mg. She's checking her BP at home and is getting readings of 120's-130's/60's-80's mostly, with 2 readings of 140's/80's since January 13th.   3) Hyperlipidemia: Currently managed on Zetia 10 mg and pravastatin 20 mg. Denies myalgias.   4) Hot Flashes: Currently managed on Venlafaxine XR 37.5 mg. Overall she's feeling well managed on this medication.   5) Depression: Currently managed on amitriptyline 50 mg and has been on for years. She's tried weaning off in the past, but feels well managed.  6) Atrial Fibrillation: Managed on Verapamil 120 mg, aspirin. She went into atrial fibrillation 3 days ago. She will take metoprolol and pradaxa when she goes into these episodes. Her next appointment with cardiology is in May 2017.  Review of Systems  Eyes: Negative for visual disturbance.  Respiratory: Negative for shortness of breath.   Cardiovascular: Negative for chest pain.  Musculoskeletal: Negative for myalgias.  Neurological: Negative for dizziness, numbness and headaches.       Past Medical History  Diagnosis Date  . GERD (gastroesophageal reflux disease)   . Hyperlipidemia   . Hypertension   . Diabetes mellitus without complication ([place])   . Thyroid disease   . History of colonic polyps     benign  . Mitral valve prolapse   . Obstructive sleep apnea     on CPAP   . Ventricular tachycardia (paroxysmal) ([place])   . Lung infection   . 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 Marital Status: Married    Spouse Name: N/A  . Number of Children: N/A  . Years of Education: N/A   Occupational History  . Not on file.   Social History Main Topics  . Smoking status: Former Smoker -- 20 years    Types: Cigarettes  . Smokeless tobacco: Not on file  . Alcohol Use: No  . Drug Use: No  . Sexual Activity: Not on file   Other Topics Concern  . Not on file   Social History Narrative   Married.   1 daughter.   Moved here from [place] due to husbands occupation.   She is a Corporate treasurer.   Enjoys sewing, gardening.    Past Surgical History  Procedure Laterality Date  . Knee arthroscopy  2012  . Clavicle surgery  2007    right clavicle plate pins  . Abdominal hysterectomy  2002    ovaries remain    Family History  Problem Relation Age of Onset  . Depression Mother   . Sudden death Mother 67  . Heart attack Mother   . Arthritis Father   . Heart disease Father   . Hypertension Father     No Known Allergies  Current Outpatient Prescriptions on File Prior to  Visit  Medication Sig Dispense Refill  . [name] 50 MG tablet Take 1 tablet (50 mg total) by mouth at bedtime. 90 tablet 3  . aspirin 81 MG tablet Take 1 tablet (81 mg total) by mouth daily.    Marland Kitchen b complex vitamins capsule Take 1 capsule by mouth 2 (two) times daily.    Marland Kitchen co-enzyme Q-10 50 MG capsule Take 50 mg by mouth daily.    Marland Kitchen docusate sodium (COLACE) 100 MG capsule Take 100 mg by mouth 2 (two) times daily.    Marland Kitchen ezetimibe (ZETIA) 10 MG tablet Take 1 tablet (10 mg total) by mouth daily. 90 tablet 3  . glimepiride (AMARYL) 1 MG tablet Take 1 mg by mouth daily with breakfast.    . glucose blood (ACCU-CHEK AVIVA PLUS) test strip Test glucose blood 1 to 2 times daily. 100 each 12  . levothyroxine (SYNTHROID, LEVOTHROID) 88 MCG tablet  Take 1 tablet (88 mcg total) by mouth daily before breakfast. 90 tablet 3  . Magnesium 500 MG CAPS Take 1 capsule by mouth 2 (two) times daily.    . meloxicam (MOBIC) 15 MG tablet Take 1 tablet (15 mg total) by mouth daily. 30 tablet 2  . Multiple Vitamins-Minerals (MULTIVITAMIN ADULT PO) Take 1 capsule by mouth daily.    . Omega-3 Fatty Acids (FISH OIL) 1200 MG CAPS Take 1 capsule by mouth daily after breakfast.    . omeprazole (PRILOSEC) 20 MG capsule Take 1 capsule (20 mg total) by mouth 2 (two) times daily before a meal. 180 capsule 3  . pravastatin (PRAVACHOL) 20 MG tablet Take 1 tablet (20 mg total) by mouth daily. 90 tablet 3  . propafenone (RYTHMOL SR) 225 MG 12 hr capsule Take 1 capsule (225 mg total) by mouth 2 (two) times daily. 180 capsule 3  . quinapril (ACCUPRIL) 20 MG tablet Take 1 tablet (20 mg total) by mouth at bedtime. 90 tablet 3  . sitaGLIPtin-metformin (JANUMET) 50-1000 MG per tablet Take 1 tablet by mouth 2 (two) times daily with a meal.    . venlafaxine XR (EFFEXOR XR) 37.5 MG 24 hr capsule Take 1 capsule (37.5 mg total) by mouth daily with breakfast. 90 capsule 1  . verapamil (CALAN-SR) 120 MG CR tablet Take 1 tablet (120 mg total) by mouth at bedtime. 90 tablet 3   No current facility-administered medications on file prior to visit.    BP 136/84 mmHg  Pulse 68  Temp(Src) 97.6 F (36.4 C) (Oral)  Ht 5\' 6"  (1.676 m)  Wt 171 lb 12.8 oz (77.928 kg)  BMI 27.74 kg/m2  SpO2 97%    Objective:   Physical Exam  Constitutional: She appears well-nourished.  Cardiovascular: Normal rate and regular rhythm.   Pulmonary/Chest: Effort normal and breath sounds normal.  Skin: Skin is warm and dry.  Psychiatric: She has a normal mood and affect.          Assessment & Plan:

## 2015-04-29 NOTE — Progress Notes (Signed)
Pre visit review using our clinic review tool, if applicable. No additional management support is needed unless otherwise documented below in the visit note. 

## 2015-04-29 NOTE — Patient Instructions (Signed)
Follow up with dietician as scheduled this Friday.  Continue to watch your blood pressure. Check it once daily about 3 days weekly.  My goal for you is to be less than 140/90. If you get a reading around this number or higher, then recheck it at least one hour later after resting. If it continues to be elevated, then please notify me.  Schedule a lab only appointment in 3 months for recheck of your cholesterol and A1C.  It was a pleasure to see you today!

## 2015-04-29 NOTE — Assessment & Plan Note (Addendum)
A1C at 7.1 which is slightly above goal. Could not tolerate glimiperide as this was dropping sugars causing her to ingest candy. She has an appointment with the diabetic nutritionist this Friday.  Will allow her 3 months to work on diet and exercise. If no improvement, then will add glipizide. Continue Janumet. Managed on statin and ACE. Foot exam due in Summer.

## 2015-04-29 NOTE — Assessment & Plan Note (Signed)
Stable today. 2 elevated readings of 140/80 since January 13th without recheck. Discussed to continue to monitor and to notify us if BP continues to remain above goal of 140/90.

## 2015-04-29 NOTE — Assessment & Plan Note (Signed)
Stable. Due for recheck in 3 months. Continue zetia and statin.

## 2015-04-29 NOTE — Assessment & Plan Note (Signed)
Recent episode 3 days ago. Will take metoprolol and pradaxa during acute episodes. Next appointment with cardiology in May 2017. Regular rate and rhythm.

## 2015-05-01 ENCOUNTER — Encounter: Payer: Self-pay | Admitting: *Deleted

## 2015-05-01 ENCOUNTER — Encounter: Payer: Managed Care, Other (non HMO) | Attending: Primary Care | Admitting: *Deleted

## 2015-05-01 VITALS — BP 124/86 | Ht 66.0 in | Wt 169.9 lb

## 2015-05-01 DIAGNOSIS — E119 Type 2 diabetes mellitus without complications: Secondary | ICD-10-CM | POA: Insufficient documentation

## 2015-05-01 NOTE — Progress Notes (Signed)
Diabetes Self-Management Education  Visit Type: First/Initial  Appt. Start Time:1105  Appt. End Time: 1220  05/01/2015  Ms. 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 female with a diagnosis of Diabetes: Type 2.   ASSESSMENT  Blood pressure 124/86, height 5\' 6"  (1.676 m), weight 169 lb 14.4 oz (77.066 kg). Body mass index is 27.44 kg/(m^2).      Diabetes Self-Management Education - 05/01/15 1321    Visit Information   Visit Type First/Initial   Initial Visit   Diabetes Type Type 2   Are you currently following a meal plan? No   Are you taking your medications as prescribed? Yes   Date Diagnosed 10 years ago   Health Coping   How would you rate your overall health? Good   Psychosocial Assessment   Patient Belief/Attitude about Diabetes Motivated to manage diabetes  "worry about getting problems due to diabetes"   Self-care barriers None   Self-management support Doctor's office;Family;Church   Other persons present Spouse/SO   Patient Concerns Nutrition/Meal planning;Medication;Glycemic Control;Healthy Lifestyle;Problem Solving;Weight Control   Special Needs None   Preferred Learning Style Hands on;Visual   Learning Readiness Ready   How often do you need to have someone help you when you read instructions, pamphlets, or other written materials from your doctor or pharmacy? 1 - Never   What is the last grade level you completed in school? 12 + (pt is retired Corporate treasurer)   Complications   Last HgB A1C per patient/outside source 7.1 %  04/22/15   How often do you check your blood sugar? 1-2 times/day   Fasting Blood glucose range (mg/dL) 70-129;130-179  FBG's 108-144 mg/dL   Number of hypoglycemic episodes per month 1  Pt reported symptoms of low blood sugar during this visit. She tested at 11:50 am with reading of 66 mg/dL.    Can you tell when your blood sugar is low? Yes   What do you do if your blood sugar is low? During this visit she was treated with  2 glucose tablets and repeat reading was 93 mg/dL at 12:10.     Have you had a dilated eye exam in the past 12 months? Yes   Have you had a dental exam in the past 12 months? Yes   Are you checking your feet? Yes   How many days per week are you checking your feet? 7   Dietary Intake   Breakfast cereal and milk with banana; Grreek yogurt   Lunch soup, sandwich, salad, hummus on pita   Snack (afternoon) fruit   Dinner pizza, spaghetiti, baked chicken with spinach, chicken sandwich, salads   Snack (evening) desserts   Beverage(s) water, Coke, unsweetened tea   Exercise   Exercise Type ADL's   Patient Education   Previous Diabetes Education No   Disease state  Definition of diabetes, type 1 and 2, and the diagnosis of diabetes   Nutrition management  Role of diet in the treatment of diabetes and the relationship between the three main macronutrients and blood glucose level;Carbohydrate counting   Physical activity and exercise  Role of exercise on diabetes management, blood pressure control and cardiac health.   Medications Reviewed patients medication for diabetes, action, purpose, timing of dose and side effects.   Monitoring Purpose and frequency of SMBG.;Identified appropriate SMBG and/or A1C goals.   Acute complications Taught treatment of hypoglycemia - the 15 rule.   Chronic complications Relationship between chronic complications and blood glucose  control   Psychosocial adjustment Identified and addressed patients feelings and concerns about diabetes   Individualized Goals (developed by patient)   Reducing Risk Improve blood sugars Decrease medications Prevent diabetes complications Lose weight Lead a healthier lifestyle Become more fit   Outcomes   Expected Outcomes Demonstrated interest in learning. Expect positive outcomes      Individualized Plan for Diabetes Self-Management Training:   Learning Objective:  Patient will have a greater understanding of diabetes  self-management. Patient education plan is to attend individual and/or group sessions per assessed needs and concerns.   Plan:   Patient Instructions  Check blood sugars 1 x day before breakfast or 2 hrs after a meal every day Exercise: Begin walking   for 15  minutes 3 days a week and increase to 150 minutes/week Avoid sugar sweetened drinks (soda) Eat 3 meals day, 1-2  snacks a day Space meals 4-6 hours apart Limit desserts/sweets Bring blood sugar records to the next class Carry fast acting glucose and a snack at all times  Expected Outcomes:  Demonstrated interest in learning. Expect positive outcomes  Education material provided:  General Meal Planning Guidelines Simple Meal Plan Glucose tablets Symptoms, causes and treatments of Hypoglycemia  If problems or questions, patient to contact team via:   Johny Drilling, [place], [place], CDE [phone]  Future DSME appointment:  Pt to check her calendar and call back to schedule classes.

## 2015-05-01 NOTE — Patient Instructions (Addendum)
Check blood sugars 1 x day before breakfast or 2 hrs after a meal every day Exercise: Begin walking   for 15  minutes 3 days a week and increase to 150 minutes/week Avoid sugar sweetened drinks (soda) Eat 3 meals day, 1-2  snacks a day Space meals 4-6 hours apart Limit desserts/sweets Bring blood sugar records to the next class Carry fast acting glucose and a snack at all times

## 2015-06-02 ENCOUNTER — Encounter: Payer: Self-pay | Admitting: *Deleted

## 2015-06-12 ENCOUNTER — Encounter: Payer: Self-pay | Admitting: Family Medicine

## 2015-06-12 ENCOUNTER — Ambulatory Visit (INDEPENDENT_AMBULATORY_CARE_PROVIDER_SITE_OTHER): Payer: Managed Care, Other (non HMO) | Admitting: Family Medicine

## 2015-06-12 VITALS — BP 142/86 | HR 92 | Temp 97.9°F | Wt 175.2 lb

## 2015-06-12 DIAGNOSIS — J029 Acute pharyngitis, unspecified: Secondary | ICD-10-CM | POA: Insufficient documentation

## 2015-06-12 LAB — POCT RAPID STREP A (OFFICE): Rapid Strep A Screen: NEGATIVE

## 2015-06-12 MED ORDER — AMOXICILLIN-POT CLAVULANATE 875-125 MG PO TABS: 1.0000 | ORAL_TABLET | Freq: Two times a day (BID) | ORAL | Status: DC

## 2015-06-12 NOTE — Patient Instructions (Signed)
Nice to meet you. You likely have pharyngitis leading to your symptoms. We will cover with an antibiotic given its persistence and lack of improvement with supportive care. If you develop difficulty swallowing, muffled voice, trouble breathing, persistent fevers, or any new or changing symptoms please seek medical attention.

## 2015-06-12 NOTE — Assessment & Plan Note (Addendum)
Patient with 7 days of sore throat. Mild exudate in posterior oropharynx noted. Rapid strep negative. Otherwise benign exam. Vital signs are stable. Discussed potential causes for this. Viral versus bacterial. Discussed option of treating with antibiotics given persistence despite conservative measures. Patient opted for antibiotics. We'll treat with Augmentin to cover non-group A strep pharyngitis. Patient is given return precautions.

## 2015-06-12 NOTE — Progress Notes (Signed)
Patient ID: Debra Perez, female   DOB: [birth year], 60 y.o.   MRN: [id number]  Tommi Rumps, MD Phone: [phone]  Debra Perez is a 60 y.o. female who presents today for same-day visit.  Patient presents with 7 days of sore throat. Has had minimal postnasal drip with this. No cough. Mild left ear discomfort. No sinus congestion or nasal congestion. No shortness of breath. Notes highest temperature was 101F yesterday. She notes her husband was in the hospital 2 weeks ago with sepsis that she believes might have come from his sinuses. She has taken Advil with some benefit.  PMH: Former smoker   ROS see history of present illness  Objective  Physical Exam Filed Vitals:   06/12/15 0932  BP: 142/86  Pulse: 92  Temp: 97.9 F (36.6 C)    BP Readings from Last 3 Encounters:  06/12/15 142/86  05/01/15 124/86  04/29/15 136/84   Wt Readings from Last 3 Encounters:  06/12/15 175 lb 4 oz (79.493 kg)  05/01/15 169 lb 14.4 oz (77.066 kg)  04/29/15 171 lb 12.8 oz (77.928 kg)    Physical Exam  Constitutional: She is well-developed, well-nourished, and in no distress.  HENT:  Head: Normocephalic and atraumatic.  Right Ear: External ear normal.  Left Ear: External ear normal.  Oropharynx with mild erythema and mild white exudate in the posterior oropharynx, moist mucous membranes, normal TMs bilaterally  Eyes: Conjunctivae are normal. Pupils are equal, round, and reactive to light.  Neck: Neck supple.  Cardiovascular: Normal rate, regular rhythm and normal heart sounds.   Pulmonary/Chest: Effort normal and breath sounds normal.  Lymphadenopathy:    She has no cervical adenopathy.  Neurological: She is alert. Gait normal.  Skin: Skin is warm and dry. She is not diaphoretic.     Assessment/Plan: Please see individual problem list.  Pharyngitis Patient with 7 days of sore throat. Mild exudate in posterior oropharynx noted. Rapid strep negative. Otherwise benign exam.  Vital signs are stable. Discussed potential causes for this. Viral versus bacterial. Discussed option of treating with antibiotics given persistence despite conservative measures. Patient opted for antibiotics. We'll treat with Augmentin to cover non-group A strep pharyngitis. Patient is given return precautions.    Orders Placed This Encounter  Procedures  . POCT rapid strep A    Meds ordered this encounter  Medications  . DISCONTD: amoxicillin-clavulanate (AUGMENTIN) 875-125 MG tablet    Sig: Take 1 tablet by mouth 2 (two) times daily.    Dispense:  14 tablet    Refill:  0  . amoxicillin-clavulanate (AUGMENTIN) 875-125 MG tablet    Sig: Take 1 tablet by mouth 2 (two) times daily.    Dispense:  14 tablet    Refill:  0    Tommi Rumps, MD [place]

## 2015-06-12 NOTE — Progress Notes (Signed)
Pre visit review using our clinic review tool, if applicable. No additional management support is needed unless otherwise documented below in the visit note. 

## 2015-06-18 ENCOUNTER — Ambulatory Visit (INDEPENDENT_AMBULATORY_CARE_PROVIDER_SITE_OTHER): Payer: Managed Care, Other (non HMO) | Admitting: Primary Care

## 2015-06-18 ENCOUNTER — Encounter: Payer: Self-pay | Admitting: Primary Care

## 2015-06-18 VITALS — BP 120/88 | HR 97 | Temp 98.0°F | Wt 171.1 lb

## 2015-06-18 DIAGNOSIS — J029 Acute pharyngitis, unspecified: Secondary | ICD-10-CM

## 2015-06-18 MED ORDER — LIDOCAINE VISCOUS 2 % MT SOLN: OROMUCOSAL | Status: DC

## 2015-06-18 NOTE — Progress Notes (Signed)
Subjective:    Patient ID: Debra Perez, female    DOB: 1955-11-25, 60 y.o.   MRN: [id number]  HPI  Debra Perez is a 60 year old female who presents today with a chief complaint of sore throat. Her sore throat has been present for the past 12 days. She was evaluated at [facility] on 04/07 with complaints of sore throat. She had a negative rapid strep that day but based on fevers and symptoms she was initiated on Augmentin. She took her last dose of Augmentin today.   Since completion of her antibiotics she's feeling fatigued and continues to experience a sore throat with swelling. She doesn't notice an improvement. Denies fevers, nausea. She's doing salt water gargles, chloraseptic spray, lozenges, Zyrtec, Flonase, and taking ibuprofen without improvement.   Review of Systems  Constitutional: Positive for fatigue. Negative for fever and chills.  HENT: Positive for postnasal drip and sore throat. Negative for congestion and ear pain.   Respiratory: Negative for cough and shortness of breath.   Gastrointestinal: Negative for nausea.  Neurological: Positive for headaches.       Past Medical History  Diagnosis Date  . GERD (gastroesophageal reflux disease)   . Hyperlipidemia   . Hypertension   . Diabetes mellitus without complication ([place])   . Thyroid disease   . History of colonic polyps     benign  . Mitral valve prolapse   . Obstructive sleep apnea     on CPAP  . Ventricular tachycardia (paroxysmal) ([place])   . Lung infection   . 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 Sleep apnea     Social History   Social History  . Marital Status: Married    Spouse Name: N/A  . Number of Children: N/A  . Years of Education: N/A   Occupational History  . Not on file.   Social History Main Topics  . Smoking status: Former Smoker -- 1.00 packs/day for 20 years    Types: Cigarettes    Quit date: 03/07/2005  . Smokeless tobacco: Never Used  . Alcohol Use: No  .  Drug Use: No  . Sexual Activity: Not on file   Other Topics Concern  . Not on file   Social History Narrative   Married.   1 daughter.   Moved here from [place] due to husbands occupation.   She is a Corporate treasurer.   Enjoys sewing, gardening.    Past Surgical History  Procedure Laterality Date  . Knee arthroscopy  2012  . Clavicle surgery  2007    right clavicle plate pins  . Abdominal hysterectomy  2002    ovaries remain    Family History  Problem Relation Age of Onset  . Depression Mother   . Sudden death Mother 26  . Heart attack Mother   . Arthritis Father   . Heart disease Father   . Hypertension Father   . Diabetes Sister   . Diabetes Brother   . Diabetes Maternal Grandfather     No Known Allergies  Current Outpatient Prescriptions on File Prior to Visit  Medication Sig Dispense Refill  . [name] 50 MG tablet Take 1 tablet (50 mg total) by mouth at bedtime. 90 tablet 3  . aspirin 81 MG tablet Take 1 tablet (81 mg total) by mouth daily.    Marland Kitchen b complex vitamins capsule Take 1 capsule by mouth 2 (two) times daily.    Marland Kitchen co-enzyme Q-10 50  MG capsule Take 50 mg by mouth daily.    Marland Kitchen docusate sodium (COLACE) 100 MG capsule Take 100 mg by mouth daily.     Marland Kitchen ezetimibe (ZETIA) 10 MG tablet Take 1 tablet (10 mg total) by mouth daily. 90 tablet 3  . glucose blood (ACCU-CHEK AVIVA PLUS) test strip Test glucose blood 1 to 2 times daily. 100 each 12  . levothyroxine (SYNTHROID, LEVOTHROID) 88 MCG tablet Take 1 tablet (88 mcg total) by mouth daily before breakfast. 90 tablet 3  . Magnesium 500 MG CAPS Take 1 capsule by mouth 2 (two) times daily.    . meloxicam (MOBIC) 15 MG tablet Take 1 tablet (15 mg total) by mouth daily. (Patient taking differently: Take 15 mg by mouth daily as needed. ) 30 tablet 2  . Multiple Vitamins-Minerals (MULTIVITAMIN ADULT PO) Take 1 capsule by mouth 2 (two) times daily.     . Omega-3 Fatty Acids (FISH OIL) 1200 MG CAPS Take 1 capsule by mouth  daily after breakfast.    . omeprazole (PRILOSEC) 20 MG capsule Take 1 capsule (20 mg total) by mouth 2 (two) times daily before a meal. 180 capsule 3  . pravastatin (PRAVACHOL) 20 MG tablet Take 1 tablet (20 mg total) by mouth daily. 90 tablet 3  . propafenone (RYTHMOL SR) 225 MG 12 hr capsule Take 1 capsule (225 mg total) by mouth 2 (two) times daily. 180 capsule 3  . quinapril (ACCUPRIL) 20 MG tablet Take 1 tablet (20 mg total) by mouth at bedtime. 90 tablet 3  . sitaGLIPtin-metformin (JANUMET) 50-1000 MG per tablet Take 1 tablet by mouth 2 (two) times daily with a meal.    . venlafaxine XR (EFFEXOR XR) 37.5 MG 24 hr capsule Take 1 capsule (37.5 mg total) by mouth daily with breakfast. 90 capsule 1  . verapamil (CALAN-SR) 120 MG CR tablet Take 1 tablet (120 mg total) by mouth at bedtime. (Patient taking differently: Take 120 mg by mouth daily after breakfast. ) 90 tablet 3   No current facility-administered medications on file prior to visit.    BP 120/88 mmHg  Pulse 97  Temp(Src) 98 F (36.7 C) (Oral)  Wt 171 lb 1.9 oz (77.62 kg)  SpO2 98%    Objective:   Physical Exam  Constitutional: She appears well-nourished. She does not appear ill.  HENT:  Right Ear: Tympanic membrane and ear canal normal.  Left Ear: Tympanic membrane and ear canal normal.  Nose: Right sinus exhibits no maxillary sinus tenderness and no frontal sinus tenderness. Left sinus exhibits no maxillary sinus tenderness and no frontal sinus tenderness.  Mouth/Throat: Oropharynx is clear and moist.  Eyes: Conjunctivae are normal.  Neck: Neck supple.  Cardiovascular: Normal rate and regular rhythm.   Pulmonary/Chest: Effort normal and breath sounds normal. She has no wheezes. She has no rales.  Lymphadenopathy:    She has no cervical adenopathy.  Skin: Skin is warm and dry.          Assessment & Plan:  Pharyngitis:  Symptoms for the past 12 days, treated 7 days ago with Augmentin without  improvement. Suspect viral involvement since lack of improvement with Augmentin course. Exam unremarkable, HENT exam does not represent any bacterial process. Lungs clear.  Will treat symptoms and have her continue allergy regimen. RX for viscous lidocaine sent to pharmacy. Continue Zyrtec, Flonase, Ibuprofen. Discussed return precautions, especially if fevers over 101. Follow up PRN.

## 2015-06-18 NOTE — Patient Instructions (Signed)
You may use the viscous lidocaine as needed for sore throat. Gargle and swallow 10 ml every 4-6 hours as needed for sore throat.   You may also take ibuprofen 600 mg every 8 hours as needed for pain and inflammation.  Continue Flonase and Zyrtec for allergy symptoms.  It was a pleasure to see you today!  Pharyngitis Pharyngitis is redness, pain, and swelling (inflammation) of your pharynx.  CAUSES  Pharyngitis is usually caused by infection. Most of the time, these infections are from viruses (viral) and are part of a cold. However, sometimes pharyngitis is caused by bacteria (bacterial). Pharyngitis can also be caused by allergies. Viral pharyngitis may be spread from person to person by coughing, sneezing, and personal items or utensils (cups, forks, spoons, toothbrushes). Bacterial pharyngitis may be spread from person to person by more intimate contact, such as kissing.  SIGNS AND SYMPTOMS  Symptoms of pharyngitis include:   Sore throat.   Tiredness (fatigue).   Low-grade fever.   Headache.  Joint pain and muscle aches.  Skin rashes.  Swollen lymph nodes.  Plaque-like film on throat or tonsils (often seen with bacterial pharyngitis). DIAGNOSIS  Your health care provider will ask you questions about your illness and your symptoms. Your medical history, along with a physical exam, is often all that is needed to diagnose pharyngitis. Sometimes, a rapid strep test is done. Other lab tests may also be done, depending on the suspected cause.  TREATMENT  Viral pharyngitis will usually get better in 3-4 days without the use of medicine. Bacterial pharyngitis is treated with medicines that kill germs (antibiotics).  HOME CARE INSTRUCTIONS   Drink enough water and fluids to keep your urine clear or pale yellow.   Only take over-the-counter or prescription medicines as directed by your health care provider:   If you are prescribed antibiotics, make sure you finish them even if  you start to feel better.   Do not take aspirin.   Get lots of rest.   Gargle with 8 oz of salt water ( tsp of salt per 1 qt of water) as often as every 1-2 hours to soothe your throat.   Throat lozenges (if you are not at risk for choking) or sprays may be used to soothe your throat. SEEK MEDICAL CARE IF:   You have large, tender lumps in your neck.  You have a rash.  You cough up green, yellow-brown, or bloody spit. SEEK IMMEDIATE MEDICAL CARE IF:   Your neck becomes stiff.  You drool or are unable to swallow liquids.  You vomit or are unable to keep medicines or liquids down.  You have severe pain that does not go away with the use of recommended medicines.  You have trouble breathing (not caused by a stuffy nose). MAKE SURE YOU:   Understand these instructions.  Will watch your condition.  Will get help right away if you are not doing well or get worse.   This information is not intended to replace advice given to you by your health care provider. Make sure you discuss any questions you have with your health care provider.   Document Released: 02/21/2005 Document Revised: 12/12/2012 Document Reviewed: 10/29/2012 Elsevier Interactive Patient Education [employer].

## 2015-06-18 NOTE — Progress Notes (Signed)
Pre visit review using our clinic review tool, if applicable. No additional management support is needed unless otherwise documented below in the visit note. 

## 2015-07-27 ENCOUNTER — Other Ambulatory Visit (INDEPENDENT_AMBULATORY_CARE_PROVIDER_SITE_OTHER): Payer: Managed Care, Other (non HMO)

## 2015-07-27 DIAGNOSIS — E785 Hyperlipidemia, unspecified: Secondary | ICD-10-CM | POA: Diagnosis not present

## 2015-07-27 DIAGNOSIS — E119 Type 2 diabetes mellitus without complications: Secondary | ICD-10-CM

## 2015-07-27 LAB — LIPID PANEL
CHOLESTEROL: 146 mg/dL (ref 0–200)
HDL: 49.4 mg/dL (ref >39.00)
LDL Cholesterol: 79 mg/dL (ref 0–99)
NONHDL: 96.45
Total CHOL/HDL Ratio: 3
Triglycerides: 89 mg/dL (ref 0.0–149.0)
VLDL: 17.8 mg/dL (ref 0.0–40.0)

## 2015-07-27 LAB — HEMOGLOBIN A1C: HEMOGLOBIN A1C: 7.5 % — AB (ref 4.6–6.5)

## 2015-07-29 ENCOUNTER — Other Ambulatory Visit: Payer: Self-pay | Admitting: Primary Care

## 2015-07-29 DIAGNOSIS — E119 Type 2 diabetes mellitus without complications: Secondary | ICD-10-CM

## 2015-07-29 MED ORDER — GLIPIZIDE ER 2.5 MG PO TB24: 2.5000 mg | ORAL_TABLET | Freq: Every day | ORAL | Status: DC

## 2015-08-04 ENCOUNTER — Other Ambulatory Visit: Payer: Self-pay | Admitting: Primary Care

## 2015-08-04 ENCOUNTER — Encounter: Payer: Self-pay | Admitting: Primary Care

## 2015-08-04 DIAGNOSIS — R232 Flushing: Secondary | ICD-10-CM

## 2015-08-04 MED ORDER — VENLAFAXINE HCL ER 37.5 MG PO CP24: 37.5000 mg | ORAL_CAPSULE | Freq: Every day | ORAL | Status: DC

## 2015-08-07 ENCOUNTER — Ambulatory Visit (INDEPENDENT_AMBULATORY_CARE_PROVIDER_SITE_OTHER): Payer: Managed Care, Other (non HMO) | Admitting: Cardiovascular Disease

## 2015-08-07 ENCOUNTER — Encounter: Payer: Self-pay | Admitting: Cardiovascular Disease

## 2015-08-07 VITALS — BP 140/88 | HR 73 | Ht 66.0 in | Wt 173.5 lb

## 2015-08-07 DIAGNOSIS — I480 Paroxysmal atrial fibrillation: Secondary | ICD-10-CM

## 2015-08-07 DIAGNOSIS — E1159 Type 2 diabetes mellitus with other circulatory complications: Secondary | ICD-10-CM

## 2015-08-07 DIAGNOSIS — I10 Essential (primary) hypertension: Secondary | ICD-10-CM

## 2015-08-07 DIAGNOSIS — I4729 Other ventricular tachycardia: Secondary | ICD-10-CM

## 2015-08-07 DIAGNOSIS — I472 Ventricular tachycardia: Secondary | ICD-10-CM

## 2015-08-07 DIAGNOSIS — E785 Hyperlipidemia, unspecified: Secondary | ICD-10-CM | POA: Diagnosis not present

## 2015-08-07 DIAGNOSIS — R0602 Shortness of breath: Secondary | ICD-10-CM | POA: Insufficient documentation

## 2015-08-07 NOTE — Progress Notes (Signed)
Patient ID: Debra Perez, female   DOB: 1956/02/22, 60 y.o.   MRN: [id number] Cardiology Office Note  Date:  08/07/2015   ID:  Debra Perez, DOB [birth year], MRN [id number]  PCP:  Sheral Flow, NP   Chief Complaint  Patient presents with  . other    6 month follow up. Meds reviewed by the patient verbally. Pt. c/o shortness of breath.     HPI:  Ms. Debra Perez is a pleasant 60 year old woman with history of nonsustained VT/palpitations, normal ejection fraction with EF greater than 60%, mild three-vessel coronary artery disease on CTA scan, 20 years of smoking who stopped 13 years ago, recent upper respiratory infection February 2016 who developed atrial fibrillation after inhaling albuterol while on prednisone, evaluated in the hospital at that time with arrhythmia lasting for hours before converting back to normal sinus rhythm, presents for follow-up of her atrial fibrillation  History of Obstructive sleep apnea, on CPAP  In follow-up today, she denies any significant episodes of arrhythmia, no palpitations or tachycardia Shortness of breath with hills and stairs Weight has been trending upwards, no regular exercise program Hemoglobin A1c also trending upwards, 7.5. Eating the wrong foods Blood pressure measurements from home typically 130s, rarely 140, heart rates 70s to 80s  Denies any nonsustained VT symptoms, no palpitations or fluttering EKG on today's visit shows normal sinus rhythm with rate 73 bpm, right bundle branch block CT scan done in 2016 again reviewed with him showing mild coronary artery disease calcification  Other past medical history  atrial fibrillation 01/16/2015 lasting at least 5 hours Possibly also 2 other very short episodes mention on her last clinic visit  metoprolol held for fatigue  multaq held for her loss  started Rythmol 225 mg twice a day, SR dose, and on this regiment has felt better   She moved to the area from [place] when her  husband changed jobs. She likes the area and has not had any significant problems since her arrival. In [place], she developed a severe upper respiratory infection, failed outpatient ABX. At home she was using Solu-Medrol, albuterol. Shortly after using the albuterol, she developed tachycardia, lightheadedness and went to the emergency room, found to be in atrial fibrillation. She is given IV medication though she does not know the details. Converted back to normal sinus rhythm  Was previously on anticoagulation, pradaxa, later changed to high-dose aspirin. She has been maintained on multaq, previously on metoprolol which she liked better but was changed February 2016.  Echocardiogram February 2016 was essentially normal, ejection fraction 60-65% Cardiac CTA with mild three-vessel CAD, ectatic ascending aorta measuring 3.72 cm  Total cholesterol 154, LDL 83  PFTs with mild obstructive lung disease  previous EKG on showing normal sinus rhythm with rate 75 bpm, no significant ST or T-wave changes   PMH:   has a past medical history of GERD (gastroesophageal reflux disease); Hyperlipidemia; Hypertension; Diabetes mellitus without complication ([place]); Thyroid disease; History of colonic polyps; Mitral valve prolapse; Obstructive sleep apnea; Ventricular tachycardia (paroxysmal) ([place]); Lung infection; Carotid artery occlusion; and Sleep apnea.  PSH:    Past Surgical History  Procedure Laterality Date  . Knee arthroscopy  2012  . Clavicle surgery  2007    right clavicle plate pins  . Abdominal hysterectomy  2002    ovaries remain    Current Outpatient Prescriptions  Medication Sig Dispense Refill  . [name] 50 MG tablet Take 1 tablet (50 mg total) by mouth at bedtime. 90 tablet 3  .  aspirin 81 MG tablet Take 1 tablet (81 mg total) by mouth daily.    Marland Kitchen b complex vitamins capsule Take 1 capsule by mouth 2 (two) times daily.    Marland Kitchen co-enzyme Q-10 50 MG capsule Take 50 mg by mouth  daily.    Marland Kitchen docusate sodium (COLACE) 100 MG capsule Take 100 mg by mouth daily.     Marland Kitchen ezetimibe (ZETIA) 10 MG tablet Take 1 tablet (10 mg total) by mouth daily. 90 tablet 3  . glipiZIDE (GLUCOTROL XL) 2.5 MG 24 hr tablet Take 1 tablet (2.5 mg total) by mouth daily with breakfast. 30 tablet 3  . glucose blood (ACCU-CHEK AVIVA PLUS) test strip Test glucose blood 1 to 2 times daily. 100 each 12  . levothyroxine (SYNTHROID, LEVOTHROID) 88 MCG tablet Take 1 tablet (88 mcg total) by mouth daily before breakfast. 90 tablet 3  . Magnesium 500 MG CAPS Take 1 capsule by mouth 2 (two) times daily.    . meloxicam (MOBIC) 15 MG tablet Take 1 tablet (15 mg total) by mouth daily. (Patient taking differently: Take 15 mg by mouth daily as needed. ) 30 tablet 2  . Multiple Vitamins-Minerals (MULTIVITAMIN ADULT PO) Take 1 capsule by mouth 2 (two) times daily.     . Omega-3 Fatty Acids (FISH OIL) 1200 MG CAPS Take 1 capsule by mouth daily after breakfast.    . omeprazole (PRILOSEC) 20 MG capsule Take 1 capsule (20 mg total) by mouth 2 (two) times daily before a meal. 180 capsule 3  . pravastatin (PRAVACHOL) 20 MG tablet Take 1 tablet (20 mg total) by mouth daily. 90 tablet 3  . propafenone (RYTHMOL SR) 225 MG 12 hr capsule Take 1 capsule (225 mg total) by mouth 2 (two) times daily. 180 capsule 3  . quinapril (ACCUPRIL) 20 MG tablet Take 1 tablet (20 mg total) by mouth at bedtime. 90 tablet 3  . sitaGLIPtin-metformin (JANUMET) 50-1000 MG per tablet Take 1 tablet by mouth 2 (two) times daily with a meal.    . venlafaxine XR (EFFEXOR XR) 37.5 MG 24 hr capsule Take 1 capsule (37.5 mg total) by mouth daily with breakfast. 90 capsule 2  . verapamil (CALAN-SR) 120 MG CR tablet Take 1 tablet (120 mg total) by mouth at bedtime. (Patient taking differently: Take 120 mg by mouth daily after breakfast. ) 90 tablet 3   No current facility-administered medications for this visit.     Allergies:   Review of patient's allergies  indicates no known allergies.   Social History:  The patient  reports that she quit smoking about 10 years ago. Her smoking use included Cigarettes. She has a 20 pack-year smoking history. She has never used smokeless tobacco. She reports that she does not drink alcohol or use illicit drugs.   Family History:   family history includes Arthritis in her father; Depression in her mother; Diabetes in her brother, maternal grandfather, and sister; Heart attack in her mother; Heart disease in her father; Hypertension in her father; Sudden death (age of onset: 44) in her mother.    Review of Systems: Review of Systems  Constitutional: Negative.   Respiratory: Positive for shortness of breath.   Cardiovascular: Negative.   Gastrointestinal: Negative.   Musculoskeletal: Negative.   Neurological: Negative.   Psychiatric/Behavioral: Negative.   All other systems reviewed and are negative.    PHYSICAL EXAM: VS:  BP 140/88 mmHg  Pulse 73  Ht 5\' 6"  (1.676 m)  Wt 173 lb 8 oz (  78.699 kg)  BMI 28.02 kg/m2 , BMI Body mass index is 28.02 kg/(m^2). GEN: Well nourished, well developed, in no acute distress HEENT: normal Neck: no JVD, carotid bruits, or masses Cardiac: RRR; no murmurs, rubs, or gallops,no edema  Respiratory:  clear to auscultation bilaterally, normal work of breathing GI: soft, nontender, nondistended, + BS MS: no deformity or atrophy Skin: warm and dry, no rash Neuro:  Strength and sensation are intact Psych: euthymic mood, full affect    Recent Labs: 10/29/2014: ALT 25; BUN 15; Creatinine, Ser 0.87; Potassium 4.3; Sodium 139 04/22/2015: Hemoglobin 10.4*; Platelets 360.0    Lipid Panel Lab Results  Component Value Date   CHOL 146 07/27/2015   HDL 49.40 07/27/2015   LDLCALC 79 07/27/2015   TRIG 89.0 07/27/2015      Wt Readings from Last 3 Encounters:  08/07/15 173 lb 8 oz (78.699 kg)  06/18/15 171 lb 1.9 oz (77.62 kg)  06/12/15 175 lb 4 oz (79.493 kg)        ASSESSMENT AND PLAN:  Paroxysmal atrial fibrillation (HCC) - Plan: EKG 12-Lead Recommended she continue her current medication regiment. No significant episodes, tolerating Rythmol  Essential hypertension -  Recommended weight loss, exercise program. No medication changes made Suggested she continue to monitor her blood pressure at home  NSVT (nonsustained ventricular tachycardia) ([place]) She denies any symptoms concerning for nonsustained VT. Has done better after prior medication changes, adding Rythmol  Hyperlipidemia Cholesterol is at goal on the current lipid regimen. No changes to the medications were made. Discussed her prior CT scan with her showing coronary calcifications  Type 2 diabetes mellitus with other circulatory complication, without long-term current use of insulin ([place]) We have encouraged continued exercise, careful diet management in an effort to lose weight.  Shortness of breath Etiology likely secondary to deconditioning Recommended regular exercise program We did discuss other conditions that can cause shortness of breath such as heart failure We did offer a diuretic for her to take as needed, she has declined at this time If symptoms get worse, could perform echocardiogram/stress test  Disposition:   F/U  6 months   Total encounter time more than 25 minutes  Greater than 50% was spent in counseling and coordination of care with the patient     Orders Placed This Encounter  Procedures  . EKG 12-Lead     Signed, Esmond Plants, M.D., Ph.D. 08/07/2015  [place], [place]

## 2015-08-07 NOTE — Patient Instructions (Signed)
You are doing well. No medication changes were made.  Please call if you would like HCTZ (weak diuretic) Strong diuretic is called lasix/furosemide  Please call us if you have new issues that need to be addressed before your next appt.  Your physician wants you to follow-up in: 12 months.  You will receive a reminder letter in the mail two months in advance. If you don't receive a letter, please call our office to schedule the follow-up appointment.

## 2015-09-29 ENCOUNTER — Encounter: Payer: Self-pay | Admitting: Primary Care

## 2015-10-01 ENCOUNTER — Other Ambulatory Visit: Payer: Self-pay

## 2015-10-01 DIAGNOSIS — E119 Type 2 diabetes mellitus without complications: Secondary | ICD-10-CM

## 2015-10-01 MED ORDER — GLIPIZIDE ER 2.5 MG PO TB24: 2.5000 mg | ORAL_TABLET | Freq: Every day | ORAL | 3 refills | Status: DC

## 2015-10-01 NOTE — Telephone Encounter (Signed)
pt request glipizide to [facility]. Per email 09/29/15 done. Pt notified and voiced understanding.

## 2015-10-26 ENCOUNTER — Other Ambulatory Visit: Payer: Self-pay | Admitting: Primary Care

## 2015-10-26 DIAGNOSIS — M1990 Unspecified osteoarthritis, unspecified site: Secondary | ICD-10-CM

## 2015-12-03 ENCOUNTER — Emergency Department: Payer: Managed Care, Other (non HMO)

## 2015-12-03 ENCOUNTER — Encounter: Payer: Self-pay | Admitting: Emergency Medicine

## 2015-12-03 ENCOUNTER — Observation Stay
Admission: EM | Admit: 2015-12-03 | Discharge: 2015-12-05 | Disposition: A | Discharge status: Home / Self Care | Payer: Managed Care, Other (non HMO) | Attending: Internal Medicine | Admitting: Internal Medicine

## 2015-12-03 DIAGNOSIS — R0789 Other chest pain: Principal | ICD-10-CM | POA: Diagnosis present

## 2015-12-03 DIAGNOSIS — R0781 Pleurodynia: Secondary | ICD-10-CM

## 2015-12-03 DIAGNOSIS — R05 Cough: Secondary | ICD-10-CM | POA: Diagnosis not present

## 2015-12-03 DIAGNOSIS — M549 Dorsalgia, unspecified: Secondary | ICD-10-CM

## 2015-12-03 DIAGNOSIS — Z87891 Personal history of nicotine dependence: Secondary | ICD-10-CM | POA: Diagnosis not present

## 2015-12-03 DIAGNOSIS — M1990 Unspecified osteoarthritis, unspecified site: Secondary | ICD-10-CM | POA: Insufficient documentation

## 2015-12-03 DIAGNOSIS — I480 Paroxysmal atrial fibrillation: Secondary | ICD-10-CM | POA: Insufficient documentation

## 2015-12-03 DIAGNOSIS — R079 Chest pain, unspecified: Secondary | ICD-10-CM | POA: Diagnosis present

## 2015-12-03 DIAGNOSIS — K219 Gastro-esophageal reflux disease without esophagitis: Secondary | ICD-10-CM | POA: Diagnosis not present

## 2015-12-03 DIAGNOSIS — Z7982 Long term (current) use of aspirin: Secondary | ICD-10-CM | POA: Insufficient documentation

## 2015-12-03 DIAGNOSIS — G4733 Obstructive sleep apnea (adult) (pediatric): Secondary | ICD-10-CM | POA: Diagnosis not present

## 2015-12-03 DIAGNOSIS — I472 Ventricular tachycardia: Secondary | ICD-10-CM | POA: Insufficient documentation

## 2015-12-03 DIAGNOSIS — E785 Hyperlipidemia, unspecified: Secondary | ICD-10-CM | POA: Diagnosis not present

## 2015-12-03 DIAGNOSIS — E039 Hypothyroidism, unspecified: Secondary | ICD-10-CM | POA: Diagnosis not present

## 2015-12-03 DIAGNOSIS — Z7984 Long term (current) use of oral hypoglycemic drugs: Secondary | ICD-10-CM | POA: Insufficient documentation

## 2015-12-03 DIAGNOSIS — I6522 Occlusion and stenosis of left carotid artery: Secondary | ICD-10-CM | POA: Insufficient documentation

## 2015-12-03 DIAGNOSIS — M546 Pain in thoracic spine: Secondary | ICD-10-CM | POA: Insufficient documentation

## 2015-12-03 DIAGNOSIS — F419 Anxiety disorder, unspecified: Secondary | ICD-10-CM | POA: Diagnosis not present

## 2015-12-03 DIAGNOSIS — R0602 Shortness of breath: Secondary | ICD-10-CM

## 2015-12-03 DIAGNOSIS — I25119 Atherosclerotic heart disease of native coronary artery with unspecified angina pectoris: Secondary | ICD-10-CM

## 2015-12-03 DIAGNOSIS — D509 Iron deficiency anemia, unspecified: Secondary | ICD-10-CM | POA: Diagnosis not present

## 2015-12-03 DIAGNOSIS — E119 Type 2 diabetes mellitus without complications: Secondary | ICD-10-CM | POA: Diagnosis not present

## 2015-12-03 DIAGNOSIS — F329 Major depressive disorder, single episode, unspecified: Secondary | ICD-10-CM | POA: Diagnosis not present

## 2015-12-03 DIAGNOSIS — Z79899 Other long term (current) drug therapy: Secondary | ICD-10-CM | POA: Diagnosis not present

## 2015-12-03 DIAGNOSIS — G8929 Other chronic pain: Secondary | ICD-10-CM

## 2015-12-03 DIAGNOSIS — I10 Essential (primary) hypertension: Secondary | ICD-10-CM | POA: Insufficient documentation

## 2015-12-03 LAB — BASIC METABOLIC PANEL
ANION GAP: 8 (ref 5–15)
BUN: 12 mg/dL (ref 6–20)
CO2: 27 mmol/L (ref 22–32)
CREATININE: 0.83 mg/dL (ref 0.44–1.00)
Calcium: 8.8 mg/dL — ABNORMAL LOW (ref 8.9–10.3)
Chloride: 102 mmol/L (ref 101–111)
GFR calc Af Amer: >60 mL/min (ref >60)
GFR calc non Af Amer: >60 mL/min (ref >60)
GLUCOSE: 102 mg/dL — AB (ref 65–99)
Potassium: 4 mmol/L (ref 3.5–5.1)
Sodium: 137 mmol/L (ref 135–145)

## 2015-12-03 LAB — CBC
HEMATOCRIT: 32.1 % — AB (ref 35.0–47.0)
Hemoglobin: 10 g/dL — ABNORMAL LOW (ref 12.0–16.0)
MCH: 22.6 pg — AB (ref 26.0–34.0)
MCHC: 31.2 g/dL — ABNORMAL LOW (ref 32.0–36.0)
MCV: 72.6 fL — AB (ref 80.0–100.0)
Platelets: 376 10*3/uL (ref 150–440)
RBC: 4.42 MIL/uL (ref 3.80–5.20)
RDW: 19.8 % — AB (ref 11.5–14.5)
WBC: 9.9 10*3/uL (ref 3.6–11.0)

## 2015-12-03 LAB — TROPONIN I: Troponin I: <0.03 ng/mL (ref <0.03)

## 2015-12-03 MED ORDER — NITROGLYCERIN 0.4 MG SL SUBL
SUBLINGUAL_TABLET | SUBLINGUAL | Status: AC
  Administered 2015-12-03: 0.4 mg via SUBLINGUAL
  Filled 2015-12-03: qty 1

## 2015-12-03 MED ORDER — NITROGLYCERIN 0.4 MG SL SUBL
0.4000 mg | SUBLINGUAL_TABLET | Freq: Once | SUBLINGUAL | Status: AC
  Administered 2015-12-03: 0.4 mg via SUBLINGUAL

## 2015-12-03 MED ORDER — ENOXAPARIN SODIUM 80 MG/0.8ML ~~LOC~~ SOLN
1.0000 mg/kg | Freq: Once | SUBCUTANEOUS | Status: AC
  Administered 2015-12-04: 80 mg via SUBCUTANEOUS
  Filled 2015-12-03: qty 0.8

## 2015-12-03 MED ORDER — NITROGLYCERIN 0.4 MG SL SUBL
0.4000 mg | SUBLINGUAL_TABLET | Freq: Once | SUBLINGUAL | Status: AC
  Administered 2015-12-03: 0.4 mg via SUBLINGUAL
  Filled 2015-12-03: qty 1

## 2015-12-03 MED ORDER — MORPHINE SULFATE (PF) 4 MG/ML IV SOLN
INTRAVENOUS | Status: AC
Start: 2015-12-03 — End: 2015-12-03
  Administered 2015-12-03: 4 mg via INTRAVENOUS
  Filled 2015-12-03: qty 1

## 2015-12-03 MED ORDER — MORPHINE SULFATE (PF) 4 MG/ML IV SOLN
4.0000 mg | Freq: Once | INTRAVENOUS | Status: AC
  Administered 2015-12-03: 4 mg via INTRAVENOUS

## 2015-12-03 NOTE — ED Triage Notes (Addendum)
Patient ambulatory to triage with steady gait, without difficulty or distress noted; pt reports onset tightness to upper chest/back hr PTA radiating down left arm; denies any accomp symptoms; st hx of same with afib but pain was different; 4-81mg  ASA PTA

## 2015-12-03 NOTE — ED Provider Notes (Signed)
[facility] Emergency Department Provider Note        Time seen: ----------------------------------------- 10:18 PM on 12/03/2015 -----------------------------------------    I have reviewed the triage vital signs and the nursing notes.   HISTORY  Chief Complaint Chest Pain    HPI Debra Perez is a 60 y.o. female who presents to ER forsudden onset of chest tightness into her back and down her left arm. Patient states the pain also into her neck, nothing made it better or worse. She felt a little short of breath, did not have sweats or nausea. She took 4 baby aspirin prior to arrival. Patient states she's had a history of similar with paroxysmal atrial fibrillation but this pain was different. It is somewhat improved.   Past Medical History:  Diagnosis Date  . Atrial fibrillation ([place])   . 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 Diabetes mellitus without complication ([place])   . GERD (gastroesophageal reflux disease)   . History of colonic polyps    benign  . Hyperlipidemia   . Hypertension   . Lung infection   . Mitral valve prolapse   . Obstructive sleep apnea    on CPAP  . Sleep apnea   . Thyroid disease   . Ventricular tachycardia (paroxysmal) [facility])     Patient Active Problem List   Diagnosis Date Noted  . SOB (shortness of breath) 08/07/2015  . Pharyngitis 06/12/2015  . GERD (gastroesophageal reflux disease) 11/06/2014  . NSVT (nonsustained ventricular tachycardia) ([place]) 11/06/2014  . Preventative health care 11/05/2014  . Atrial fibrillation ([place]) 09/17/2014  . Hot flashes 09/17/2014  . Type 2 diabetes mellitus ([place]) 08/21/2014  . Essential hypertension 08/21/2014  . Hyperlipidemia 08/21/2014  . Hypothyroidism 08/21/2014    Past Surgical History:  Procedure Laterality Date  . ABDOMINAL HYSTERECTOMY  2002   ovaries remain  . CLAVICLE SURGERY  2007   right clavicle plate pins  . KNEE ARTHROSCOPY  2012     Allergies Review of patient's allergies indicates no known allergies.  Social History Social History  Substance Use Topics  . Smoking status: Former Smoker    Packs/day: 1.00    Years: 20.00    Types: Cigarettes    Quit date: 03/07/2005  . Smokeless tobacco: Never Used  . Alcohol use No    Review of Systems Constitutional: Negative for fever. Cardiovascular: Positive for chest pain Respiratory: Negative for shortness of breath. Gastrointestinal: Negative for abdominal pain, vomiting and diarrhea. Genitourinary: Negative for dysuria. Musculoskeletal: Negative for back pain. Skin: Negative for rash. Neurological: Negative for headaches, focal weakness or numbness.  10-point ROS otherwise negative.  ____________________________________________   PHYSICAL EXAM:  VITAL SIGNS: ED Triage Vitals  Enc Vitals Group     BP 12/03/15 1913 (!) 162/74     Pulse Rate 12/03/15 1913 87     Resp 12/03/15 1913 20     Temp 12/03/15 1913 98 F (36.7 C)     Temp Source 12/03/15 1913 Oral     SpO2 12/03/15 1913 99 %     Weight 12/03/15 1912 175 lb (79.4 kg)     Height 12/03/15 1912 5\' 6"  (1.676 m)     Head Circumference --      Peak Flow --      Pain Score 12/03/15 1912 10     Pain Loc --      Pain Edu? --      Excl. in [place]? --  Constitutional: Alert and oriented. Well appearing and in no distress. Eyes: Conjunctivae are normal. PERRL. Normal extraocular movements. ENT   Head: Normocephalic and atraumatic.   Nose: No congestion/rhinnorhea.   Mouth/Throat: Mucous membranes are moist.   Neck: No stridor. Cardiovascular: Normal rate, regular rhythm. No murmurs, rubs, or gallops. Respiratory: Normal respiratory effort without tachypnea nor retractions. Breath sounds are clear and equal bilaterally. No wheezes/rales/rhonchi. Gastrointestinal: Soft and nontender. Normal bowel sounds Musculoskeletal: Nontender with normal range of motion in all extremities. No lower  extremity tenderness nor edema. Neurologic:  Normal speech and language. No gross focal neurologic deficits are appreciated.  Skin:  Skin is warm, dry and intact. No rash noted. Psychiatric: Mood and affect are normal. Speech and behavior are normal.  ____________________________________________  EKG: Interpreted by me.Sinus rhythm with a rate of 84 bpm, normal PR interval, wide QRS, long QT, right bundle branch block.  ____________________________________________  ED COURSE:  Pertinent labs & imaging results that were available during my care of the patient were reviewed by me and considered in my medical decision making (see chart for details). Clinical Course  Patient presents to the ER with multiple risk factors and concerns for unstable angina. We will assess with cardiac labs, give nitroglycerin and reevaluate.  Procedures ____________________________________________   LABS (pertinent positives/negatives)  Labs Reviewed  BASIC METABOLIC PANEL - Abnormal; Notable for the following:       Result Value   Glucose, Bld 102 (*)    Calcium 8.8 (*)    All other components within normal limits  CBC - Abnormal; Notable for the following:    Hemoglobin 10.0 (*)    HCT 32.1 (*)    MCV 72.6 (*)    MCH 22.6 (*)    MCHC 31.2 (*)    RDW 19.8 (*)    All other components within normal limits  TROPONIN I    RADIOLOGY Chest x-ray IMPRESSION: Linear atelectasis or scarring involving the lower lobes. No acute cardiopulmonary disease otherwise.  ____________________________________________  FINAL ASSESSMENT AND PLAN  Chest pain  Plan: Patient with labs and imaging as dictated above. Patient's heart score is high risk for major adverse cardiac event. EKG is unchanged from prior, she would benefit from cardiac rule out and stress testing. I will discuss with the hospitalist for admission.   Earleen Newport, MD   Note: This dictation was prepared with Dragon dictation. Any  transcriptional errors that result from this process are unintentional    Earleen Newport, MD 12/03/15 2222

## 2015-12-04 ENCOUNTER — Observation Stay: Payer: Managed Care, Other (non HMO)

## 2015-12-04 ENCOUNTER — Encounter: Payer: Self-pay | Admitting: Emergency Medicine

## 2015-12-04 DIAGNOSIS — E785 Hyperlipidemia, unspecified: Secondary | ICD-10-CM

## 2015-12-04 DIAGNOSIS — R0781 Pleurodynia: Secondary | ICD-10-CM

## 2015-12-04 DIAGNOSIS — G8929 Other chronic pain: Secondary | ICD-10-CM

## 2015-12-04 DIAGNOSIS — I25119 Atherosclerotic heart disease of native coronary artery with unspecified angina pectoris: Secondary | ICD-10-CM

## 2015-12-04 DIAGNOSIS — R0789 Other chest pain: Secondary | ICD-10-CM | POA: Diagnosis not present

## 2015-12-04 DIAGNOSIS — M546 Pain in thoracic spine: Secondary | ICD-10-CM | POA: Diagnosis not present

## 2015-12-04 DIAGNOSIS — M549 Dorsalgia, unspecified: Secondary | ICD-10-CM

## 2015-12-04 DIAGNOSIS — I10 Essential (primary) hypertension: Secondary | ICD-10-CM

## 2015-12-04 LAB — GLUCOSE, CAPILLARY
GLUCOSE-CAPILLARY: 150 mg/dL — AB (ref 65–99)
Glucose-Capillary: 145 mg/dL — ABNORMAL HIGH (ref 65–99)
Glucose-Capillary: 120 mg/dL — ABNORMAL HIGH (ref 65–99)
Glucose-Capillary: 234 mg/dL — ABNORMAL HIGH (ref 65–99)
Glucose-Capillary: 277 mg/dL — ABNORMAL HIGH (ref 65–99)

## 2015-12-04 LAB — LIPID PANEL
CHOLESTEROL: 142 mg/dL (ref 0–200)
HDL: 50 mg/dL (ref >40)
LDL CALC: 74 mg/dL (ref 0–99)
TRIGLYCERIDES: 92 mg/dL (ref <150)
Total CHOL/HDL Ratio: 2.8 RATIO
VLDL: 18 mg/dL (ref 0–40)

## 2015-12-04 LAB — TROPONIN I

## 2015-12-04 LAB — IRON AND TIBC
Iron: 19 ug/dL — ABNORMAL LOW (ref 28–170)
Saturation Ratios: 5 % — ABNORMAL LOW (ref 10.4–31.8)
TIBC: 385 ug/dL (ref 250–450)
UIBC: 366 ug/dL

## 2015-12-04 LAB — TSH: TSH: 1.418 u[IU]/mL (ref 0.350–4.500)

## 2015-12-04 LAB — FERRITIN: Ferritin: 4 ng/mL — ABNORMAL LOW (ref 11–307)

## 2015-12-04 MED ORDER — ASPIRIN EC 81 MG PO TBEC
81.0000 mg | DELAYED_RELEASE_TABLET | Freq: Every day | ORAL | Status: DC
  Administered 2015-12-04 – 2015-12-05 (×2): 81 mg via ORAL
  Filled 2015-12-04 (×2): qty 1

## 2015-12-04 MED ORDER — AMITRIPTYLINE HCL 50 MG PO TABS
50.0000 mg | ORAL_TABLET | Freq: Every day | ORAL | Status: DC
  Administered 2015-12-04 (×2): 50 mg via ORAL
  Filled 2015-12-04 (×3): qty 1

## 2015-12-04 MED ORDER — AZITHROMYCIN 250 MG PO TABS
250.0000 mg | ORAL_TABLET | Freq: Every day | ORAL | Status: DC
  Administered 2015-12-05: 250 mg via ORAL
  Filled 2015-12-04: qty 1

## 2015-12-04 MED ORDER — IOPAMIDOL (ISOVUE-370) INJECTION 76%
75.0000 mL | Freq: Once | INTRAVENOUS | Status: AC | PRN
  Administered 2015-12-04: 75 mL via INTRAVENOUS

## 2015-12-04 MED ORDER — INFLUENZA VAC SPLIT QUAD 0.5 ML IM SUSY
0.5000 mL | PREFILLED_SYRINGE | INTRAMUSCULAR | Status: AC
  Administered 2015-12-05: 0.5 mL via INTRAMUSCULAR
  Filled 2015-12-04: qty 0.5

## 2015-12-04 MED ORDER — NITROGLYCERIN 2 % TD OINT
0.5000 [in_us] | TOPICAL_OINTMENT | Freq: Four times a day (QID) | TRANSDERMAL | Status: DC
  Administered 2015-12-04 (×2): 0.5 [in_us] via TOPICAL
  Filled 2015-12-04 (×4): qty 1

## 2015-12-04 MED ORDER — MELOXICAM 7.5 MG PO TABS: 15.0000 mg | ORAL_TABLET | Freq: Every day | ORAL | Status: DC | PRN

## 2015-12-04 MED ORDER — ADULT MULTIVITAMIN W/MINERALS CH
1.0000 | ORAL_TABLET | Freq: Every day | ORAL | Status: DC
  Administered 2015-12-04: 1 via ORAL
  Filled 2015-12-04: qty 1

## 2015-12-04 MED ORDER — PROPAFENONE HCL ER 225 MG PO CP12
225.0000 mg | ORAL_CAPSULE | Freq: Two times a day (BID) | ORAL | Status: DC
  Administered 2015-12-04 – 2015-12-05 (×3): 225 mg via ORAL
  Filled 2015-12-04 (×5): qty 1

## 2015-12-04 MED ORDER — METHYLPREDNISOLONE SODIUM SUCC 125 MG IJ SOLR
60.0000 mg | INTRAMUSCULAR | Status: DC
  Administered 2015-12-04 – 2015-12-05 (×2): 60 mg via INTRAVENOUS
  Filled 2015-12-04 (×2): qty 2

## 2015-12-04 MED ORDER — INSULIN ASPART 100 UNIT/ML ~~LOC~~ SOLN
0.0000 [IU] | SUBCUTANEOUS | Status: DC
  Administered 2015-12-04: 2 [IU] via SUBCUTANEOUS
  Administered 2015-12-04: 8 [IU] via SUBCUTANEOUS
  Administered 2015-12-04: 5 [IU] via SUBCUTANEOUS
  Administered 2015-12-04 – 2015-12-05 (×2): 2 [IU] via SUBCUTANEOUS
  Administered 2015-12-05: 3 [IU] via SUBCUTANEOUS
  Filled 2015-12-04 (×2): qty 3
  Filled 2015-12-04: qty 5
  Filled 2015-12-04: qty 2
  Filled 2015-12-04: qty 8
  Filled 2015-12-04 (×2): qty 2

## 2015-12-04 MED ORDER — LEVALBUTEROL HCL 1.25 MG/0.5ML IN NEBU
1.2500 mg | INHALATION_SOLUTION | Freq: Once | RESPIRATORY_TRACT | Status: AC
  Administered 2015-12-04: 1.25 mg via RESPIRATORY_TRACT
  Filled 2015-12-04: qty 0.5

## 2015-12-04 MED ORDER — B COMPLEX-C PO TABS
1.0000 | ORAL_TABLET | Freq: Two times a day (BID) | ORAL | Status: DC
  Administered 2015-12-04 – 2015-12-05 (×3): 1 via ORAL
  Filled 2015-12-04 (×4): qty 1

## 2015-12-04 MED ORDER — AZITHROMYCIN 250 MG PO TABS
500.0000 mg | ORAL_TABLET | Freq: Every day | ORAL | Status: AC
  Administered 2015-12-04: 500 mg via ORAL
  Filled 2015-12-04: qty 2

## 2015-12-04 MED ORDER — EZETIMIBE 10 MG PO TABS
10.0000 mg | ORAL_TABLET | Freq: Every day | ORAL | Status: DC
  Administered 2015-12-04 – 2015-12-05 (×2): 10 mg via ORAL
  Filled 2015-12-04 (×2): qty 1

## 2015-12-04 MED ORDER — ALPRAZOLAM 0.5 MG PO TABS: 0.2500 mg | ORAL_TABLET | Freq: Two times a day (BID) | ORAL | Status: DC | PRN

## 2015-12-04 MED ORDER — METOPROLOL TARTRATE 25 MG PO TABS
12.5000 mg | ORAL_TABLET | Freq: Two times a day (BID) | ORAL | Status: DC
  Administered 2015-12-04 – 2015-12-05 (×3): 12.5 mg via ORAL
  Filled 2015-12-04 (×3): qty 1

## 2015-12-04 MED ORDER — MAGNESIUM OXIDE 400 (241.3 MG) MG PO TABS
400.0000 mg | ORAL_TABLET | Freq: Two times a day (BID) | ORAL | Status: DC
  Administered 2015-12-04 – 2015-12-05 (×3): 400 mg via ORAL
  Filled 2015-12-04 (×3): qty 1

## 2015-12-04 MED ORDER — ALBUTEROL SULFATE (2.5 MG/3ML) 0.083% IN NEBU
2.5000 mg | INHALATION_SOLUTION | Freq: Four times a day (QID) | RESPIRATORY_TRACT | Status: DC
  Administered 2015-12-04 (×2): 2.5 mg via RESPIRATORY_TRACT
  Filled 2015-12-04 (×6): qty 3

## 2015-12-04 MED ORDER — ONDANSETRON HCL 4 MG/2ML IJ SOLN: 4.0000 mg | Freq: Four times a day (QID) | INTRAMUSCULAR | Status: DC | PRN

## 2015-12-04 MED ORDER — VENLAFAXINE HCL ER 37.5 MG PO CP24
37.5000 mg | ORAL_CAPSULE | Freq: Every day | ORAL | Status: DC
  Administered 2015-12-04 – 2015-12-05 (×2): 37.5 mg via ORAL
  Filled 2015-12-04 (×2): qty 1

## 2015-12-04 MED ORDER — LEVOTHYROXINE SODIUM 88 MCG PO TABS
88.0000 ug | ORAL_TABLET | Freq: Every day | ORAL | Status: DC
  Administered 2015-12-04 – 2015-12-05 (×2): 88 ug via ORAL
  Filled 2015-12-04 (×2): qty 1

## 2015-12-04 MED ORDER — PANTOPRAZOLE SODIUM 40 MG PO TBEC
40.0000 mg | DELAYED_RELEASE_TABLET | Freq: Every day | ORAL | Status: DC
Start: 2015-12-04 — End: 2015-12-05
  Administered 2015-12-04 – 2015-12-05 (×2): 40 mg via ORAL
  Filled 2015-12-04 (×2): qty 1

## 2015-12-04 MED ORDER — QUINAPRIL HCL 10 MG PO TABS
20.0000 mg | ORAL_TABLET | Freq: Every day | ORAL | Status: DC
  Filled 2015-12-04: qty 2

## 2015-12-04 MED ORDER — SODIUM CHLORIDE 0.9 % IV SOLN
INTRAVENOUS | Status: DC
  Administered 2015-12-04: 02:00:00 via INTRAVENOUS

## 2015-12-04 MED ORDER — GI COCKTAIL ~~LOC~~: 30.0000 mL | Freq: Four times a day (QID) | ORAL | Status: DC | PRN

## 2015-12-04 MED ORDER — ACETAMINOPHEN 325 MG PO TABS
650.0000 mg | ORAL_TABLET | ORAL | Status: DC | PRN
  Administered 2015-12-04 (×2): 650 mg via ORAL
  Filled 2015-12-04 (×2): qty 2

## 2015-12-04 MED ORDER — ZOLPIDEM TARTRATE 5 MG PO TABS
5.0000 mg | ORAL_TABLET | Freq: Every evening | ORAL | Status: DC | PRN
  Administered 2015-12-04 (×2): 5 mg via ORAL
  Filled 2015-12-04 (×2): qty 1

## 2015-12-04 MED ORDER — VERAPAMIL HCL ER 120 MG PO TBCR
120.0000 mg | EXTENDED_RELEASE_TABLET | Freq: Every day | ORAL | Status: DC
  Administered 2015-12-04 (×2): 120 mg via ORAL
  Filled 2015-12-04 (×3): qty 1

## 2015-12-04 MED ORDER — CO-ENZYME Q-10 50 MG PO CAPS: 50.0000 mg | ORAL_CAPSULE | Freq: Every day | ORAL | Status: DC

## 2015-12-04 MED ORDER — PRAVASTATIN SODIUM 20 MG PO TABS
20.0000 mg | ORAL_TABLET | Freq: Every day | ORAL | Status: DC
  Administered 2015-12-04 – 2015-12-05 (×2): 20 mg via ORAL
  Filled 2015-12-04 (×2): qty 1

## 2015-12-04 MED ORDER — DOCUSATE SODIUM 100 MG PO CAPS
100.0000 mg | ORAL_CAPSULE | Freq: Every day | ORAL | Status: DC
  Administered 2015-12-04 – 2015-12-05 (×2): 100 mg via ORAL
  Filled 2015-12-04 (×2): qty 1

## 2015-12-04 MED ORDER — OMEGA-3-ACID ETHYL ESTERS 1 G PO CAPS
1.0000 g | ORAL_CAPSULE | Freq: Every day | ORAL | Status: DC
  Administered 2015-12-04 – 2015-12-05 (×2): 1 g via ORAL
  Filled 2015-12-04 (×2): qty 1

## 2015-12-04 MED ORDER — ENOXAPARIN SODIUM 80 MG/0.8ML ~~LOC~~ SOLN
1.0000 mg/kg | Freq: Two times a day (BID) | SUBCUTANEOUS | Status: DC
  Administered 2015-12-04 – 2015-12-05 (×3): 80 mg via SUBCUTANEOUS
  Filled 2015-12-04 (×3): qty 0.8

## 2015-12-04 MED ORDER — MORPHINE SULFATE (PF) 2 MG/ML IV SOLN
2.0000 mg | INTRAVENOUS | Status: DC | PRN
  Administered 2015-12-04: 2 mg via INTRAVENOUS
  Filled 2015-12-04 (×2): qty 1

## 2015-12-04 MED ORDER — LISINOPRIL 20 MG PO TABS
20.0000 mg | ORAL_TABLET | Freq: Every day | ORAL | Status: DC
  Administered 2015-12-04 (×2): 20 mg via ORAL
  Filled 2015-12-04 (×2): qty 1

## 2015-12-04 NOTE — Progress Notes (Signed)
Patient refused her albuterol treatment mid therapy as she states it has been known to put her in atrial fibrillation. She is requesting a xopenex substitute which has been he regiment

## 2015-12-04 NOTE — H&P (Signed)
[facility] @ [facility] Admission History and Physical [employer], D.O.  ---------------------------------------------------------------------------------------------------------------------   PATIENT NAMENaina Perez MR#: [id number] DATE OF BIRTH: June 10, 1955 DATE OF ADMISSION: 12/03/2015 PRIMARY CARE PHYSICIAN: Sheral Flow, NP  REQUESTING/REFERRING PHYSICIAN: ED Dr. Jimmye Norman  CHIEF COMPLAINT: Chief Complaint  Patient presents with  . Chest Pain    HISTORY OF PRESENT ILLNESS: Debra Perez is a 60 y.o. female with a known history of afib, DM, CAD, OSA, hypothyroidism Presents to the emergency department today complaining of left-sided chest pain radiating into her back between her shoulder blades and down into her left neck and arm associated with mild shortness of breath and palpitations. Patient denied any nausea, vomiting, diaphoresis. Pain was relieved with morphine in the emergency department. Not significantly relieved with nitroglycerin.  Of note patient states that her last stress test was an exercise stress test in 2005 and she had an echo and carotid in 2016.  Otherwise there has been no change in status. Patient has been taking medication as prescribed and there has been no recent change in medication or diet.  There has been no recent illness, travel or sick contacts.    Patient denies fevers/chills, weakness, dizziness, chest pain, shortness of breath, N/V/C/D, abdominal pain, dysuria/frequency, changes in mental status.   EMS/ED COURSE:   Patient took 4 baby aspirin prior to arrival  PAST MEDICAL HISTORY: Past Medical History:  Diagnosis Date  . Atrial fibrillation ([place])   . 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 Diabetes mellitus without complication ([place])   . GERD (gastroesophageal reflux disease)   . History of colonic polyps    benign  . Hyperlipidemia   . Hypertension   . Lung infection   . Mitral valve prolapse    . Obstructive sleep apnea    on CPAP  . Sleep apnea   . Thyroid disease   . Ventricular tachycardia (paroxysmal) ([place])       PAST SURGICAL HISTORY: Past Surgical History:  Procedure Laterality Date  . ABDOMINAL HYSTERECTOMY  2002   ovaries remain  . CLAVICLE SURGERY  2007   right clavicle plate pins  . KNEE ARTHROSCOPY  2012      SOCIAL HISTORY: Social History  Substance Use Topics  . Smoking status: Former Smoker    Packs/day: 1.00    Years: 20.00    Types: Cigarettes    Quit date: 03/07/2005  . Smokeless tobacco: Never Used  . Alcohol use No      FAMILY HISTORY: Family History  Problem Relation Age of Onset  . Depression Mother   . Sudden death Mother 90  . Heart attack Mother   . Arthritis Father   . Heart disease Father   . Hypertension Father   . Diabetes Sister   . Diabetes Brother   . Diabetes Maternal Grandfather      MEDICATIONS AT HOME: Prior to Admission medications   Medication Sig Start Date End Date Taking? Authorizing Provider  [name] 50 MG tablet Take 1 tablet (50 mg total) by mouth at bedtime. 02/04/15  Yes Pleas [name], NP  aspirin 81 MG tablet Take 1 tablet (81 mg total) by mouth daily. 11/06/14  Yes Minna Merritts, MD  b complex vitamins capsule Take 1 capsule by mouth 2 (two) times daily.   Yes Historical Provider, MD  co-enzyme Q-10 50 MG capsule Take 50 mg by mouth daily.   Yes Historical Provider, MD  docusate sodium (  COLACE) 100 MG capsule Take 100 mg by mouth daily.    Yes Historical Provider, MD  ezetimibe (ZETIA) 10 MG tablet Take 1 tablet (10 mg total) by mouth daily. 02/02/15  Yes Minna Merritts, MD  glipiZIDE (GLUCOTROL XL) 2.5 MG 24 hr tablet Take 1 tablet (2.5 mg total) by mouth daily with breakfast. 10/01/15  Yes Pleas [name], NP  glucose blood (ACCU-CHEK AVIVA PLUS) test strip Test glucose blood 1 to 2 times daily. 02/04/15  Yes Pleas [name], NP  levothyroxine (SYNTHROID, LEVOTHROID) 88 MCG  tablet Take 1 tablet (88 mcg total) by mouth daily before breakfast. 02/04/15  Yes Pleas [name], NP  Magnesium 500 MG CAPS Take 1 capsule by mouth 2 (two) times daily.   Yes Historical Provider, MD  meloxicam (MOBIC) 15 MG tablet TAKE 1 BY MOUTH DAILY AS NEEDED 10/26/15  Yes Pleas [name], NP  Multiple Vitamins-Minerals (MULTIVITAMIN ADULT PO) Take 1 capsule by mouth 2 (two) times daily.    Yes Historical Provider, MD  Omega-3 Fatty Acids (FISH OIL) 1200 MG CAPS Take 1 capsule by mouth daily after breakfast.   Yes Historical Provider, MD  omeprazole (PRILOSEC) 20 MG capsule Take 1 capsule (20 mg total) by mouth 2 (two) times daily before a meal. 02/04/15  Yes Pleas [name], NP  pravastatin (PRAVACHOL) 20 MG tablet Take 1 tablet (20 mg total) by mouth daily. 02/02/15  Yes Minna Merritts, MD  propafenone (RYTHMOL SR) 225 MG 12 hr capsule Take 1 capsule (225 mg total) by mouth 2 (two) times daily. 02/02/15  Yes Minna Merritts, MD  quinapril (ACCUPRIL) 20 MG tablet Take 1 tablet (20 mg total) by mouth at bedtime. 02/02/15  Yes Minna Merritts, MD  sitaGLIPtin-metformin (JANUMET) 50-1000 MG per tablet Take 1 tablet by mouth 2 (two) times daily with a meal.   Yes Historical Provider, MD  venlafaxine XR (EFFEXOR XR) 37.5 MG 24 hr capsule Take 1 capsule (37.5 mg total) by mouth daily with breakfast. 08/04/15  Yes Pleas [name], NP  verapamil (CALAN-SR) 120 MG CR tablet Take 1 tablet (120 mg total) by mouth at bedtime. Patient taking differently: Take 120 mg by mouth daily after breakfast.  02/02/15  Yes Minna Merritts, MD      DRUG ALLERGIES: No Known Allergies   REVIEW OF SYSTEMS: CONSTITUTIONAL: No fever/chills, fatigue, weakness, weight gain/loss, headache EYES: No blurry or double vision. ENT: No tinnitus, postnasal drip, redness or soreness of the oropharynx. RESPIRATORY: No cough, wheeze, hemoptysis, Positive mild dyspnea. CARDIOVASCULAR: Positive chest pain, negative  orthopnea, palpitations, syncope. GASTROINTESTINAL: No nausea, vomiting, constipation, diarrhea, abdominal pain, hematemesis, melena or hematochezia. GENITOURINARY: No dysuria or hematuria. ENDOCRINE: No polyuria or nocturia. No heat or cold intolerance. HEMATOLOGY: No anemia, bruising, bleeding. INTEGUMENTARY: No rashes, ulcers, lesions. MUSCULOSKELETAL: No arthritis, swelling, gout. NEUROLOGIC: No numbness, tingling, weakness or ataxia. No seizure-type activity. PSYCHIATRIC: No anxiety, depression, insomnia.  PHYSICAL EXAMINATION: VITAL SIGNS: Blood pressure 139/76, pulse 100, temperature 98 F (36.7 C), temperature source Oral, resp. rate 18, height 5\' 6"  (1.676 m), weight 79.4 kg (175 lb), SpO2 90 %.  GENERAL: 60 y.o.-year-old white female patient, well-developed, well-nourished lying in the bed in no acute distress.  Pleasant and cooperative.   HEENT: Head atraumatic, normocephalic. Pupils equal, round, reactive to light and accommodation. No scleral icterus. Extraocular muscles intact. Nares are patent. Oropharynx is clear. Mucus membranes moist. NECK: Supple, full range of motion. No JVD, no bruit heard. No  thyroid enlargement, no tenderness, no cervical lymphadenopathy. CHEST: Normal breath sounds bilaterally. No wheezing, rales, rhonchi or crackles. No use of accessory muscles of respiration.  No reproducible chest wall tenderness.  CARDIOVASCULAR: S1, S2 normal. No murmurs, rubs, or gallops. Cap refill <2 seconds. ABDOMEN: Soft, nontender, nondistended. No rebound, guarding, rigidity. Normoactive bowel sounds present in all four quadrants. No organomegaly or mass. EXTREMITIES: Full range of motion. No pedal edema, cyanosis, or clubbing. NEUROLOGIC: Cranial nerves II through XII are grossly intact with no focal sensorimotor deficit. Muscle strength 5/5 in all extremities. Sensation intact. Gait not checked. PSYCHIATRIC: The patient is alert and oriented x 3. Normal affect, mood,  thought content. SKIN: Warm, dry, and intact without obvious rash, lesion, or ulcer.  LABORATORY PANEL:  CBC  Recent Labs Lab 12/03/15 1918  WBC 9.9  HGB 10.0*  HCT 32.1*  PLT 376   ----------------------------------------------------------------------------------------------------------------- Chemistries  Recent Labs Lab 12/03/15 1918  NA 137  K 4.0  CL 102  CO2 27  GLUCOSE 102*  BUN 12  CREATININE 0.83  CALCIUM 8.8*   ------------------------------------------------------------------------------------------------------------------ Cardiac Enzymes  Recent Labs Lab 12/03/15 1918  TROPONINI <0.03   ------------------------------------------------------------------------------------------------------------------  RADIOLOGY: Dg Chest 2 View  Result Date: 12/03/2015 CLINICAL DATA:  60 year old with acute onset of upper chest tightness radiating into the left arm which began approximately 1 hour prior to the emergency department admission. EXAM: CHEST  2 VIEW COMPARISON:  None. FINDINGS: Cardiac silhouette normal in size. Thoracic aorta mildly tortuous. Hilar and mediastinal contours otherwise unremarkable. Linear atelectasis or scarring in both lower lobes. Lungs otherwise clear. No localized airspace consolidation. No pleural effusions. No pneumothorax. Normal pulmonary vascularity. Elevation of the right hemidiaphragm. Prior ORIF of a right clavicle fracture with healing. IMPRESSION: Linear atelectasis or scarring involving the lower lobes. No acute cardiopulmonary disease otherwise. Electronically Signed   By: Evangeline Dakin M.D.   On: 12/03/2015 19:40    EKG: Normal sinus rhythm at 84 bpm with a right bundle branch block, old long QT and nonspecific ST and T wave changes.  IMPRESSION AND PLAN:  This is a 60 y.o. female with a history of  afib, DM, CAD, OSA, hypothyroidism  now being admitted with: 1. Chest pain, rule out ACS-patient is at high risk for  cardiovascular events.  Admit to observation with telemetry monitoring, pain control with nitroglycerin and morphine. Trend troponins, check TSH and lipid panel. Cardiology consultation for consideration of stress test, repeat echo and carotids in the a.m. Nothing by mouth after midnight. She received weight-based Lovenox the emergency department which we'll continue every 12. We will continue the patient's regular home medications for coronary artery disease which include aspirin, Zetia, pravastatin, Accupril, verapamil, coenzyme every 10 and fish oil. I have added metoprolol 12.5 twice a day. 2. Microcytic anemia, near baseline-we'll check iron studies and monitor CBC. 2. History of atrial fibrillation, rate controlled-continue Rythmol History of diabetes-hold oral hypoglycemics for now. Accu-Cheks every 4 hours with sliding scale insulin coverage 3. History of hyperlipidemia-continue pravastatin, Zetia and Lovaza. 4. History of hypothyroidism-continue Synthroid 5. History of osteoarthritis-continue Mobitz 6. History of anxiety/depression-continue Effexor 7. History of GERD-continue Prilosec   Diet/Nutrition: Heart healthy, carb controlled. Nothing by mouth after midnight Fluids: IV normal saline DVT Px: Lovenox, SCDs and early ambulation Code Status: Full  All the records are reviewed and case discussed with ED provider. Management plans discussed with the patient and/or family who express understanding and agree with plan of care.   TOTAL TIME TAKING  CARE OF THIS PATIENT: 60 minutes.   Bintou Lafata D.O. on 12/04/2015 at 12:15 AM Between 7am to 6pm - Pager - [phone] After 6pm go to [email or web address] - [employer]  Hospitalists Office [phone] CC: Primary care physician; Sheral Flow, NP     Note: This dictation was prepared with Dragon dictation along with smaller phrase technology. Any transcriptional errors that result from this  process are unintentional.

## 2015-12-04 NOTE — Consult Note (Signed)
Cardiology Consultation Note  Patient ID: Debra Perez, MRN: [id number], DOB/AGE: Mar 11, 1955 60 y.o. Admit date: 12/03/2015   Date of Consult: 12/04/2015 Primary Physician: Sheral Flow, NP Primary Cardiologist: Dr. Rockey Situ, MD Requesting Physician: Dr. Ara Kussmaul, DO  Chief Complaint: Cough/chest pain Reason for Consult: Chest pain associated with cough and deep inspiration   HPI: 60 y.o. female with h/o PAF previously on Pradaxa now on ASA in the setting of pulmonary infection and albuterol use, history of NSVT with normal EF of > 60% by echo 01/2015, nonobstructive CAD by coronary CT 01/2015 showing mild 3-vessel CAD medically maanged, prior tobacco abuse for 20 years stopping 13 years ago, OSA on CPAP, DM2, HTN, and HLD who presented to [facility] with a one-day history of chest pain. Initially chest pain was at rest followed by chest pain that is now only present with coughing and deep inspiration. Never with exertional chest pain. Associated SOB. No associated nausea, vomiting, palpitations, diaphoresis, dizziness, presyncope, or syncope. Weight has been stable. No LE swelling, orthopnea, or early satiety.   Upon the patient's arrival to [facility] they were found to have troponin negative x 4, HGB 10.0, WBC 9.9, SCr 0.83, K+ 4.0, D-dimer pending . ECG as below, CXR showed atelectasis. SHe continues to note dry cough that is exacerbating her chest pain this morning. She feels like her symptoms are pulmonary in etiology.    Past Medical History:  Diagnosis Date  . Atrial fibrillation ([place])   . 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 Diabetes mellitus without complication ([place])   . GERD (gastroesophageal reflux disease)   . History of colonic polyps    benign  . Hyperlipidemia   . Hypertension   . Lung infection   . Mitral valve prolapse   . Obstructive sleep apnea    on CPAP  . Sleep apnea   . Thyroid disease   . Ventricular tachycardia (paroxysmal) ([place])       Most  Recent Cardiac Studies: Coronary CT 01/2015: mild 3 vessel CAD without flow limiting stenosis.  Echo 01/2015: normal EF, DD   Surgical History:  Past Surgical History:  Procedure Laterality Date  . ABDOMINAL HYSTERECTOMY  2002   ovaries remain  . CLAVICLE SURGERY  2007   right clavicle plate pins  . KNEE ARTHROSCOPY  2012     Home Meds: Prior to Admission medications   Medication Sig Start Date End Date Taking? Authorizing Provider  [name] 50 MG tablet Take 1 tablet (50 mg total) by mouth at bedtime. 02/04/15  Yes Pleas [name], NP  aspirin 81 MG tablet Take 1 tablet (81 mg total) by mouth daily. 11/06/14  Yes Minna Merritts, MD  b complex vitamins capsule Take 1 capsule by mouth 2 (two) times daily.   Yes Historical Provider, MD  co-enzyme Q-10 50 MG capsule Take 50 mg by mouth daily.   Yes Historical Provider, MD  docusate sodium (COLACE) 100 MG capsule Take 100 mg by mouth daily.    Yes Historical Provider, MD  ezetimibe (ZETIA) 10 MG tablet Take 1 tablet (10 mg total) by mouth daily. 02/02/15  Yes Minna Merritts, MD  glipiZIDE (GLUCOTROL XL) 2.5 MG 24 hr tablet Take 1 tablet (2.5 mg total) by mouth daily with breakfast. 10/01/15  Yes Pleas [name], NP  glucose blood (ACCU-CHEK AVIVA PLUS) test strip Test glucose blood 1 to 2 times daily. 02/04/15  Yes Pleas [name], NP  levothyroxine (SYNTHROID, LEVOTHROID)  88 MCG tablet Take 1 tablet (88 mcg total) by mouth daily before breakfast. 02/04/15  Yes Pleas [name], NP  Magnesium 500 MG CAPS Take 1 capsule by mouth 2 (two) times daily.   Yes Historical Provider, MD  meloxicam (MOBIC) 15 MG tablet TAKE 1 BY MOUTH DAILY AS NEEDED 10/26/15  Yes Pleas [name], NP  Multiple Vitamins-Minerals (MULTIVITAMIN ADULT PO) Take 1 capsule by mouth 2 (two) times daily.    Yes Historical Provider, MD  Omega-3 Fatty Acids (FISH OIL) 1200 MG CAPS Take 1 capsule by mouth daily after breakfast.   Yes Historical Provider, MD   omeprazole (PRILOSEC) 20 MG capsule Take 1 capsule (20 mg total) by mouth 2 (two) times daily before a meal. 02/04/15  Yes Pleas [name], NP  pravastatin (PRAVACHOL) 20 MG tablet Take 1 tablet (20 mg total) by mouth daily. 02/02/15  Yes Minna Merritts, MD  propafenone (RYTHMOL SR) 225 MG 12 hr capsule Take 1 capsule (225 mg total) by mouth 2 (two) times daily. 02/02/15  Yes Minna Merritts, MD  quinapril (ACCUPRIL) 20 MG tablet Take 1 tablet (20 mg total) by mouth at bedtime. 02/02/15  Yes Minna Merritts, MD  sitaGLIPtin-metformin (JANUMET) 50-1000 MG per tablet Take 1 tablet by mouth 2 (two) times daily with a meal.   Yes Historical Provider, MD  venlafaxine XR (EFFEXOR XR) 37.5 MG 24 hr capsule Take 1 capsule (37.5 mg total) by mouth daily with breakfast. 08/04/15  Yes Pleas [name], NP  verapamil (CALAN-SR) 120 MG CR tablet Take 1 tablet (120 mg total) by mouth at bedtime. Patient taking differently: Take 120 mg by mouth daily after breakfast.  02/02/15  Yes Minna Merritts, MD    Inpatient Medications:  . amitriptyline  50 mg Oral QHS  . aspirin EC  81 mg Oral Daily  . B-complex with vitamin C  1 tablet Oral BID  . docusate sodium  100 mg Oral Daily  . enoxaparin (LOVENOX) injection  1 mg/kg Subcutaneous Q12H  . ezetimibe  10 mg Oral Daily  . [START ON 12/05/2015] Influenza vac split quadrivalent PF  0.5 mL Intramuscular Tomorrow-1000  . insulin aspart  0-15 Units Subcutaneous Q4H  . levothyroxine  88 mcg Oral QAC breakfast  . lisinopril  20 mg Oral QHS  . magnesium oxide  400 mg Oral BID  . metoprolol tartrate  12.5 mg Oral BID  . multivitamin with minerals  1 tablet Oral Q supper  . nitroGLYCERIN  0.5 inch Topical Q6H  . omega-3 acid ethyl esters  1 g Oral Daily  . pantoprazole  40 mg Oral QAC breakfast  . pravastatin  20 mg Oral Daily  . propafenone  225 mg Oral BID  . venlafaxine XR  37.5 mg Oral Q breakfast  . verapamil  120 mg Oral QHS   . sodium chloride 75  mL/hr at 12/04/15 0206    Allergies: No Known Allergies  Social History   Social History  . Marital status: Married    Spouse name: N/A  . Number of children: N/A  . Years of education: N/A   Occupational History  . Not on file.   Social History Main Topics  . Smoking status: Former Smoker    Packs/day: 1.00    Years: 20.00    Types: Cigarettes    Quit date: 03/07/2005  . Smokeless tobacco: Never Used  . Alcohol use No  . Drug use: No  . Sexual activity: Not  on file   Other Topics Concern  . Not on file   Social History Narrative   Married.   1 daughter.   Moved here from [place] due to husbands occupation.   She is a Corporate treasurer.   Enjoys sewing, gardening.     Family History  Problem Relation Age of Onset  . Depression Mother   . Sudden death Mother 40  . Heart attack Mother   . Arthritis Father   . Heart disease Father   . Hypertension Father   . Diabetes Sister   . Diabetes Brother   . Diabetes Maternal Grandfather      Review of Systems: Review of Systems  Constitutional: Positive for malaise/fatigue. Negative for chills, diaphoresis, fever and weight loss.  HENT: Negative for congestion.   Eyes: Negative for discharge and redness.  Respiratory: Positive for cough, shortness of breath and wheezing. Negative for sputum production.   Cardiovascular: Positive for chest pain. Negative for palpitations, orthopnea, claudication, leg swelling and PND.  Gastrointestinal: Negative for abdominal pain, heartburn, nausea and vomiting.  Musculoskeletal: Negative for falls and myalgias.  Skin: Negative for rash.  Neurological: Positive for weakness. Negative for dizziness, tingling, tremors, sensory change, speech change, focal weakness and loss of consciousness.  Endo/Heme/Allergies: Does not bruise/bleed easily.  Psychiatric/Behavioral: Negative for substance abuse. The patient is not nervous/anxious.   All other systems reviewed and are negative.   Labs:  Recent  Labs  12/03/15 1918 12/04/15 0316 12/04/15 0626 12/04/15 0903  TROPONINI <0.03 <0.03 <0.03 <0.03   Lab Results  Component Value Date   WBC 9.9 12/03/2015   HGB 10.0 (L) 12/03/2015   HCT 32.1 (L) 12/03/2015   MCV 72.6 (L) 12/03/2015   PLT 376 12/03/2015     Recent Labs Lab 12/03/15 1918  NA 137  K 4.0  CL 102  CO2 27  BUN 12  CREATININE 0.83  CALCIUM 8.8*  GLUCOSE 102*   Lab Results  Component Value Date   CHOL 142 12/04/2015   HDL 50 12/04/2015   LDLCALC 74 12/04/2015   TRIG 92 12/04/2015   No results found for: DDIMER  Radiology/Studies:  Dg Chest 2 View  Result Date: 12/03/2015 CLINICAL DATA:  60 year old with acute onset of upper chest tightness radiating into the left arm which began approximately 1 hour prior to the emergency department admission. EXAM: CHEST  2 VIEW COMPARISON:  None. FINDINGS: Cardiac silhouette normal in size. Thoracic aorta mildly tortuous. Hilar and mediastinal contours otherwise unremarkable. Linear atelectasis or scarring in both lower lobes. Lungs otherwise clear. No localized airspace consolidation. No pleural effusions. No pneumothorax. Normal pulmonary vascularity. Elevation of the right hemidiaphragm. Prior ORIF of a right clavicle fracture with healing. IMPRESSION: Linear atelectasis or scarring involving the lower lobes. No acute cardiopulmonary disease otherwise. Electronically Signed   By: Evangeline Dakin M.D.   On: 12/03/2015 19:40    EKG: Interpreted by me showed: NSR, 84 bpm, RBBB Telemetry: Interpreted by me showed: NSR, 80's bpm  Weights: Filed Weights   12/03/15 1912  Weight: 175 lb (79.4 kg)     Physical Exam: Blood pressure (!) 116/58, pulse 95, temperature 98 F (36.7 C), resp. rate 16, height 5\' 6"  (1.676 m), weight 175 lb (79.4 kg), SpO2 91 %. Body mass index is 28.25 kg/m. General: Well developed, well nourished, in no acute distress. Head: Normocephalic, atraumatic, sclera non-icteric, no xanthomas, nares  are without discharge.  Neck: Negative for carotid bruits. JVD not elevated. Lungs: Clear bilaterally to  auscultation without wheezes, rales, or rhonchi. Breathing is unlabored. Frequent coughing throughout encounter.  Heart: RRR with S1 S2. No murmurs, rubs, or gallops appreciated. Abdomen: Soft, non-tender, non-distended with normoactive bowel sounds. No hepatomegaly. No rebound/guarding. No obvious abdominal masses. Msk:  Strength and tone appear normal for age. Extremities: No clubbing or cyanosis. No edema. Distal pedal pulses are 2+ and equal bilaterally. Neuro: Alert and oriented X 3. No facial asymmetry. No focal deficit. Moves all extremities spontaneously. Psych:  Responds to questions appropriately with a normal affect.    Assessment and Plan:  Active Problems:   Atypical chest pain    1. Atypical chest pain: -Pain is only associated with deep inspiration and cough -She feels like this is similar to her prior lung infections -Troponin negative -Recent coronary CT without flow limiting stenosis as above -Recommend treatment of lungs -If pain persists could pursue outpatient stress testing  2. Cough/SOB: -Some degree of deconditioning  -Cannot rule out some degree of diastolic dysfunction -Consider low dose prn Lasix -Nebs per IM -? Steroids, per IM -D dimer pending at this time  3. PAF: -Maintaining sinus rhythm  -Continue current medication regimen  -Defer anticoagulation to primary cardiologist  -CHADS2VASc at least 4 (HTN, DM, vascular disease, female)  4. History of NSVT: -No symptoms concerning for ectopy -Monitor on tele  5. HLD: -Statin  6. HTN: Controlled   Signed, Christell Faith, PA-C [place] Pager: [phone] 12/04/2015, 10:30 AM

## 2015-12-04 NOTE — Progress Notes (Signed)
[place] [facility] [place] NAME: Debra Perez    MR#:  [id number]  DATE OF BIRTH:  08/20/1955  SUBJECTIVE:seen at bedside.admitted for chest pain on  Inspiration.also has some wheezing.  CHIEF COMPLAINT:   Chief Complaint  Patient presents with  . Chest Pain    REVIEW OF SYSTEMS:   ROS CONSTITUTIONAL: No fever, fatigue or weakness.  EYES: No blurred or double vision.  EARS, NOSE, AND THROAT: No tinnitus or ear pain.  RESPIRATORY: cough,wheezing,sob. CARDIOVASCULAR: No chest pain, orthopnea, edema.  GASTROINTESTINAL: No nausea, vomiting, diarrhea or abdominal pain.  GENITOURINARY: No dysuria, hematuria.  ENDOCRINE: No polyuria, nocturia,  HEMATOLOGY: No anemia, easy bruising or bleeding SKIN: No rash or lesion. MUSCULOSKELETAL: No joint pain or arthritis.   NEUROLOGIC: No tingling, numbness, weakness.  PSYCHIATRY: No anxiety or depression.   DRUG ALLERGIES:  No Known Allergies  VITALS:  Blood pressure (!) 103/51, pulse 69, temperature 98.8 F (37.1 C), temperature source Oral, resp. rate 16, height 5\' 6"  (1.676 m), weight 79.4 kg (175 lb), SpO2 95 %.  PHYSICAL EXAMINATION:  GENERAL:  60 y.o.-year-old patient lying in the bed with no acute distress.  EYES: Pupils equal, round, reactive to light and accommodation. No scleral icterus. Extraocular muscles intact.  HEENT: Head atraumatic, normocephalic. Oropharynx and nasopharynx clear.  NECK:  Supple, no jugular venous distention. No thyroid enlargement, no tenderness.  LUNGS: faint expiratory wheeze at bases.no  rales,rhonchi or crepitation. No use of accessory muscles of respiration.  CARDIOVASCULAR: S1, S2 normal. No murmurs, rubs, or gallops.  ABDOMEN: Soft, nontender, nondistended. Bowel sounds present. No organomegaly or mass.  EXTREMITIES: No pedal edema, cyanosis, or clubbing.  NEUROLOGIC: Cranial nerves II through XII are intact. Muscle strength 5/5 in all extremities.  Sensation intact. Gait not checked.  PSYCHIATRIC: The patient is alert and oriented x 3.  SKIN: No obvious rash, lesion, or ulcer.    LABORATORY PANEL:   CBC  Recent Labs Lab 12/03/15 1918  WBC 9.9  HGB 10.0*  HCT 32.1*  PLT 376   ------------------------------------------------------------------------------------------------------------------  Chemistries   Recent Labs Lab 12/03/15 1918  NA 137  K 4.0  CL 102  CO2 27  GLUCOSE 102*  BUN 12  CREATININE 0.83  CALCIUM 8.8*   ------------------------------------------------------------------------------------------------------------------  Cardiac Enzymes  Recent Labs Lab 12/04/15 0903  TROPONINI <0.03   ------------------------------------------------------------------------------------------------------------------  RADIOLOGY:  Dg Chest 2 View  Result Date: 12/03/2015 CLINICAL DATA:  60 year old with acute onset of upper chest tightness radiating into the left arm which began approximately 1 hour prior to the emergency department admission. EXAM: CHEST  2 VIEW COMPARISON:  None. FINDINGS: Cardiac silhouette normal in size. Thoracic aorta mildly tortuous. Hilar and mediastinal contours otherwise unremarkable. Linear atelectasis or scarring in both lower lobes. Lungs otherwise clear. No localized airspace consolidation. No pleural effusions. No pneumothorax. Normal pulmonary vascularity. Elevation of the right hemidiaphragm. Prior ORIF of a right clavicle fracture with healing. IMPRESSION: Linear atelectasis or scarring involving the lower lobes. No acute cardiopulmonary disease otherwise. Electronically Signed   By: Evangeline Dakin M.D.   On: 12/03/2015 19:40   Ct Angio Chest Pe W Or Wo Contrast  Result Date: 12/04/2015 CLINICAL DATA:  Acute onset severe chest pain, worsening with inspiration. EXAM: CT ANGIOGRAPHY CHEST WITH CONTRAST TECHNIQUE: Multidetector CT imaging of the chest was performed using the standard  protocol during bolus administration of intravenous contrast. Multiplanar CT image reconstructions and MIPs were obtained to evaluate the vascular  anatomy. CONTRAST:  Isovue 370 75 mL IV COMPARISON:  Chest radiograph 12/03/2015 FINDINGS: Vascular: There is no pulmonary embolus. The main pulmonary artery is mildly enlarged, measuring 3.2 centimeters at the bifurcation. There is no CT evidence of acute right heart strain. The visualized aorta is normal. There is a normal 3-vessel arch branching pattern. Heart size is normal, without pericardial effusion. There are coronary artery calcifications. Mediastinum/Nodes: No mediastinal, hilar or axillary lymphadenopathy. The visualized thyroid and thoracic esophageal course are unremarkable. Lungs: There is bibasilar dependent atelectasis. No pleural effusion or pneumothorax. No pulmonary nodules or masses. Central airways are widely patent. Mild mucosal irregularity along the upper mediastinal trachea. Visualized abdomen: Contrast bolus timing is not optimized for evaluation of the abdominal organs. Within this limitation, the visualized organs of the upper abdomen are normal. Musculoskeletal: No lytic or blastic osseous lesions. No bony spinal canal stenosis. Screw and plate fixation of the right clavicle. The visualized extrathoracic soft tissues are normal. Review of the MIP images confirms the above findings. IMPRESSION: 1. No pulmonary embolus. 2. Mild enlargement of the main pulmonary artery, which may be seen in the setting of pulmonary hypertension. 3. Coronary artery atherosclerosis. Electronically Signed   By: Ulyses Jarred M.D.   On: 12/04/2015 15:56    EKG:   Orders placed or performed during the hospital encounter of 12/03/15  . EKG 12-Lead  . EKG 12-Lead  . ED EKG within 10 minutes  . ED EKG within 10 minutes  . EKG 12-Lead (at 6am)  . EKG 12-Lead (Repeat cardiac markers, recurrent chest pain)  . EKG 12-Lead (at 6am)  . EKG 12-Lead (Repeat cardiac  markers, recurrent chest pain)    ASSESSMENT AND PLAN:   1.chest pressure ;likley acute bronchitis.not cardiac origin.has some wheezing;started on xopenox,pt says its helping.albuterol gives her afib. Start small dose steroid.ct angio chest is negative for PE.likly has allergic bronchits,mowed lawn yesterday , 2.dmii 3.sleep apnoea.         All the records are reviewed and case discussed with Care Management/Social Workerr. Management plans discussed with the patient, family and they are in agreement.  CODE STATUS:full  TOTAL TIME TAKING CARE OF THIS PATIENT: 8minutes.   POSSIBLE D/C IN 1-2DAYS, DEPENDING ON CLINICAL CONDITION.   Epifanio Lesches M.D on 12/04/2015 at 4:10 PM  Between 7am to 6pm - Pager - [phone]  After 6pm go to [email or web address] - password [email or web address] [place] [facility]  Office  [phone]  CC: Primary care physician; Sheral Flow, NP   Note: This dictation was prepared with Dragon dictation along with smaller phrase technology. Any transcriptional errors that result from this process are unintentional.

## 2015-12-04 NOTE — ED Notes (Signed)
Admitting doctor paged for non relief of chest pain.

## 2015-12-05 LAB — HEMOGLOBIN A1C
Hgb A1c MFr Bld: 6.3 % — ABNORMAL HIGH (ref 4.8–5.6)
Mean Plasma Glucose: 134 mg/dL

## 2015-12-05 LAB — GLUCOSE, CAPILLARY
GLUCOSE-CAPILLARY: 143 mg/dL — AB (ref 65–99)
Glucose-Capillary: 110 mg/dL — ABNORMAL HIGH (ref 65–99)
Glucose-Capillary: 119 mg/dL — ABNORMAL HIGH (ref 65–99)
Glucose-Capillary: 173 mg/dL — ABNORMAL HIGH (ref 65–99)

## 2015-12-05 MED ORDER — ASPIRIN EC 325 MG PO TBEC: 325.0000 mg | DELAYED_RELEASE_TABLET | Freq: Every day | ORAL | 0 refills | Status: DC

## 2015-12-05 MED ORDER — PREDNISONE 10 MG (21) PO TBPK: 10.0000 mg | ORAL_TABLET | Freq: Every day | ORAL | 0 refills | Status: DC

## 2015-12-05 MED ORDER — LEVALBUTEROL TARTRATE 45 MCG/ACT IN AERO: 1.0000 | INHALATION_SPRAY | Freq: Four times a day (QID) | RESPIRATORY_TRACT | 2 refills | Status: DC | PRN

## 2015-12-05 MED ORDER — AZITHROMYCIN 250 MG PO TABS: ORAL_TABLET | ORAL | 0 refills | Status: DC

## 2015-12-05 MED ORDER — LISINOPRIL 20 MG PO TABS: 20.0000 mg | ORAL_TABLET | Freq: Every day | ORAL | 0 refills | Status: DC

## 2015-12-05 MED ORDER — ALBUTEROL SULFATE (2.5 MG/3ML) 0.083% IN NEBU: 2.5000 mg | INHALATION_SOLUTION | Freq: Four times a day (QID) | RESPIRATORY_TRACT | Status: DC | PRN

## 2015-12-05 NOTE — Discharge Summary (Signed)
Debra Perez, is a 60 y.o. female  DOB 06-07-1955  MRN [id number].  Admission date:  12/03/2015  Admitting Physician  Harvie Bridge, DO  Discharge Date:  12/05/2015   Primary MD  Sheral Flow, NP  Recommendations for primary care physician for things to follow:   Primary cardiologist in 1 week Follow with primary doctor in 1 week.   Admission Diagnosis  Nonspecific chest pain [R07.9]   Discharge Diagnosis  Nonspecific chest pain [R07.9]   Active Problems:   Atypical chest pain   Pleuritic chest pain   Bilateral thoracic back pain   Coronary artery disease involving native coronary artery of native heart with angina pectoris [facility])      Past Medical History:  Diagnosis Date  . Atrial fibrillation ([place])   . 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 Diabetes mellitus without complication ([place])   . GERD (gastroesophageal reflux disease)   . History of colonic polyps    benign  . Hyperlipidemia   . Hypertension   . Lung infection   . Mitral valve prolapse   . Obstructive sleep apnea    on CPAP  . Sleep apnea   . Thyroid disease   . Ventricular tachycardia (paroxysmal) [facility])     Past Surgical History:  Procedure Laterality Date  . ABDOMINAL HYSTERECTOMY  2002   ovaries remain  . CLAVICLE SURGERY  2007   right clavicle plate pins  . KNEE ARTHROSCOPY  2012       History of present illness and  Hospital Course:     Kindly see H&P for history of present illness and admission details, please review complete Labs, Consult reports and Test reports for all details in brief  HPI  from the history and physical done on the day of admission 60 year old female patient with multiple medical problems of proximal atrial fibrillation, diabetes mellitus type 2, hypothyroidism, coronary artery  disease comes in because of left-sided chest pain. Admitted to telemetry for evaluation of MI. Patient did not have any sweating, nausea, vomiting.   Hospital Course  #1 chest pain secondary likely secondary to bronchitis. Troponins are negative for 3 times. Seen by cardiology. Started on now is up in oxygen inhalers, azithromycin, Solu-Medrol. CT angiochest negative for PE. As chest x-ray IS negative for pneumonia. Age is unremarkable. Normal coronaries  2016, echo showed normal EF that time. #2 proximal atrial fibrillation: Maintaining sinus rhythm. Defer anticoagulation to primary cardiologist patient in follow-up with primary doctor. Patient chads2 S2 VASC score at least 4.. Full dose anticoagulation with Lovenox while in the hospital. Discharging home with aspirin 325 mg daily. #3 history of NSVT: No symptoms. #4 hyperlipidemia continue statins 5 essential hypertension: Controlled.   Diabetes mellitus type 2: Continue home medications.    Discharge Condition:stable   Follow UP  Follow-up Information    Sheral Flow, NP Follow up in 1 week(s).   Specialty:  Internal Medicine Contact information: [place] [address] [phone]             Discharge Instructions  and  Discharge Medications        Medication List    STOP taking these medications   aspirin 81 MG tablet Replaced by:  aspirin EC 325 MG tablet   glucose blood test strip Commonly known as:  ACCU-CHEK AVIVA PLUS     TAKE these medications   amitriptyline 50 MG tablet Commonly known as:  ELAVIL Take 1 tablet (  50 mg total) by mouth at bedtime.   aspirin EC 325 MG tablet Take 1 tablet (325 mg total) by mouth daily. Replaces:  aspirin 81 MG tablet   azithromycin 250 MG tablet Commonly known as:  ZITHROMAX Daily for 4 days Start taking on:  12/06/2015   b complex vitamins capsule Take 1 capsule by mouth 2 (two) times daily.   co-enzyme Q-10 50 MG capsule Take 50  mg by mouth daily.   docusate sodium 100 MG capsule Commonly known as:  COLACE Take 100 mg by mouth daily.   ezetimibe 10 MG tablet Commonly known as:  ZETIA Take 1 tablet (10 mg total) by mouth daily.   Fish Oil 1200 MG Caps Take 1 capsule by mouth daily after breakfast.   glipiZIDE 2.5 MG 24 hr tablet Commonly known as:  GLUCOTROL XL Take 1 tablet (2.5 mg total) by mouth daily with breakfast.   levalbuterol 45 MCG/ACT inhaler Commonly known as:  XOPENEX HFA Inhale 1 puff into the lungs every 6 (six) hours as needed for wheezing.   levothyroxine 88 MCG tablet Commonly known as:  SYNTHROID, LEVOTHROID Take 1 tablet (88 mcg total) by mouth daily before breakfast.   Magnesium 500 MG Caps Take 1 capsule by mouth 2 (two) times daily.   meloxicam 15 MG tablet Commonly known as:  MOBIC TAKE 1 BY MOUTH DAILY AS NEEDED   MULTIVITAMIN ADULT PO Take 1 capsule by mouth 2 (two) times daily.   omeprazole 20 MG capsule Commonly known as:  PRILOSEC Take 1 capsule (20 mg total) by mouth 2 (two) times daily before a meal.   pravastatin 20 MG tablet Commonly known as:  PRAVACHOL Take 1 tablet (20 mg total) by mouth daily.   predniSONE 10 MG (21) Tbpk tablet Commonly known as:  STERAPRED UNI-PAK 21 TAB Take 1 tablet (10 mg total) by mouth daily.   propafenone 225 MG 12 hr capsule Commonly known as:  RYTHMOL SR Take 1 capsule (225 mg total) by mouth 2 (two) times daily.   quinapril 20 MG tablet Commonly known as:  ACCUPRIL Take 1 tablet (20 mg total) by mouth at bedtime.   sitaGLIPtin-metformin 50-1000 MG tablet Commonly known as:  JANUMET Take 1 tablet by mouth 2 (two) times daily with a meal.   venlafaxine XR 37.5 MG 24 hr capsule Commonly known as:  EFFEXOR XR Take 1 capsule (37.5 mg total) by mouth daily with breakfast.   verapamil 120 MG CR tablet Commonly known as:  CALAN-SR Take 1 tablet (120 mg total) by mouth at bedtime. What changed:  when to take this          Diet and Activity recommendation: See Discharge Instructions above   Consults obtained - cardio   Major procedures and Radiology Reports - PLEASE review detailed and final reports for all details, in brief -     Dg Chest 2 View  Result Date: 12/03/2015 CLINICAL DATA:  60 year old with acute onset of upper chest tightness radiating into the left arm which began approximately 1 hour prior to the emergency department admission. EXAM: CHEST  2 VIEW COMPARISON:  None. FINDINGS: Cardiac silhouette normal in size. Thoracic aorta mildly tortuous. Hilar and mediastinal contours otherwise unremarkable. Linear atelectasis or scarring in both lower lobes. Lungs otherwise clear. No localized airspace consolidation. No pleural effusions. No pneumothorax. Normal pulmonary vascularity. Elevation of the right hemidiaphragm. Prior ORIF of a right clavicle fracture with healing. IMPRESSION: Linear atelectasis or scarring involving the lower lobes.  No acute cardiopulmonary disease otherwise. Electronically Signed   By: Evangeline Dakin M.D.   On: 12/03/2015 19:40   Ct Angio Chest Pe W Or Wo Contrast  Result Date: 12/04/2015 CLINICAL DATA:  Acute onset severe chest pain, worsening with inspiration. EXAM: CT ANGIOGRAPHY CHEST WITH CONTRAST TECHNIQUE: Multidetector CT imaging of the chest was performed using the standard protocol during bolus administration of intravenous contrast. Multiplanar CT image reconstructions and MIPs were obtained to evaluate the vascular anatomy. CONTRAST:  Isovue 370 75 mL IV COMPARISON:  Chest radiograph 12/03/2015 FINDINGS: Vascular: There is no pulmonary embolus. The main pulmonary artery is mildly enlarged, measuring 3.2 centimeters at the bifurcation. There is no CT evidence of acute right heart strain. The visualized aorta is normal. There is a normal 3-vessel arch branching pattern. Heart size is normal, without pericardial effusion. There are coronary artery calcifications.  Mediastinum/Nodes: No mediastinal, hilar or axillary lymphadenopathy. The visualized thyroid and thoracic esophageal course are unremarkable. Lungs: There is bibasilar dependent atelectasis. No pleural effusion or pneumothorax. No pulmonary nodules or masses. Central airways are widely patent. Mild mucosal irregularity along the upper mediastinal trachea. Visualized abdomen: Contrast bolus timing is not optimized for evaluation of the abdominal organs. Within this limitation, the visualized organs of the upper abdomen are normal. Musculoskeletal: No lytic or blastic osseous lesions. No bony spinal canal stenosis. Screw and plate fixation of the right clavicle. The visualized extrathoracic soft tissues are normal. Review of the MIP images confirms the above findings. IMPRESSION: 1. No pulmonary embolus. 2. Mild enlargement of the main pulmonary artery, which may be seen in the setting of pulmonary hypertension. 3. Coronary artery atherosclerosis. Electronically Signed   By: Ulyses Jarred M.D.   On: 12/04/2015 15:56    Micro Results     No results found for this or any previous visit (from the past 240 hour(s)).     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   Objective:   Blood pressure (!) 133/58, pulse 64, temperature 98.1 F (36.7 C), temperature source Oral, resp. rate 16, height 5\' 6"  (1.676 m), weight 79.4 kg (175 lb), SpO2 95 %.   Intake/Output Summary (Last 24 hours) at 12/05/15 1213 Last data filed at 12/05/15 0900  Gross per 24 hour  Intake              600 ml  Output                0 ml  Net              600 ml    Exam Awake Alert, Oriented x 3, No new F.N deficits, Normal affect [place].AT,PERRAL Supple Neck,No JVD, No cervical lymphadenopathy appriciated.  Symmetrical Chest wall movement, Good air movement bilaterally, CTAB RRR,No Gallops,Rubs or new  Murmurs, No Parasternal Heave +ve B.Sounds, Abd Soft, Non tender, No organomegaly appriciated, No rebound -guarding or rigidity. No Cyanosis, Clubbing or edema, No new Rash or bruise  Data Review   CBC w Diff: Lab Results  Component Value Date   WBC 9.9 12/03/2015   HGB 10.0 (L) 12/03/2015   HCT 32.1 (L) 12/03/2015   PLT 376 12/03/2015    CMP: Lab Results  Component Value Date   NA 137 12/03/2015   K 4.0 12/03/2015   CL 102 12/03/2015   CO2 27 12/03/2015   BUN 12 12/03/2015  CREATININE 0.83 12/03/2015   PROT 7.1 10/29/2014   ALBUMIN 4.0 10/29/2014   BILITOT 0.3 10/29/2014   ALKPHOS 40 10/29/2014   AST 24 10/29/2014   ALT 25 10/29/2014  .   Total Time in preparing paper work, data evaluation and todays exam - 2 minutes  Merelyn Klump M.D on 12/05/2015 at 12:13 PM    Note: This dictation was prepared with Dragon dictation along with smaller phrase technology. Any transcriptional errors that result from this process are unintentional.

## 2015-12-05 NOTE — Progress Notes (Signed)
Patient is discharge home in a stable condition, summary and f/u care given , verbalized understanding , left with husband

## 2015-12-05 NOTE — Progress Notes (Signed)
Pt. Slept well through out the night with c/o headache  x1 pain medication given with effective results. Coverage was given for each CBG during the night. No signs or c/o SOB or acute distress noted.

## 2015-12-08 ENCOUNTER — Telehealth: Payer: Self-pay

## 2015-12-08 NOTE — Telephone Encounter (Signed)
Noted. Hospital records reviewed.

## 2015-12-08 NOTE — Telephone Encounter (Signed)
Transition Care Management Follow-up Telephone Call    Date discharged? 12/05/2015  How have you been since you were released from the hospital? [place].   Any patient concerns? None   Items Reviewed:  Medications reviewed: Yes  Allergies reviewed: N/A  Dietary changes reviewed: N/A  Referrals reviewed: N/A   Functional Questionnaire:  Independent - I Dependent - D    Activities of Daily Living (ADLs):    Personal hygiene - I Dressing - I Eating - I Maintaining continence - I Transferring - I  Independent Activities of Daily Living (iADLs): Patient is independent with all iADLs.    Confirmed importance and date/time of follow-up visits scheduled YES  Provider Appointment booked with PCP 12/09/2015 @ 0930  Confirmed with patient if condition begins to worsen call PCP or go to the ER.  Patient was given the office number and encouraged to call back with question or concerns: YES

## 2015-12-09 ENCOUNTER — Ambulatory Visit (INDEPENDENT_AMBULATORY_CARE_PROVIDER_SITE_OTHER): Payer: Managed Care, Other (non HMO) | Admitting: Primary Care

## 2015-12-09 VITALS — BP 146/94 | HR 71 | Temp 97.4°F | Ht 66.0 in | Wt 176.8 lb

## 2015-12-09 DIAGNOSIS — F19982 Other psychoactive substance use, unspecified with psychoactive substance-induced sleep disorder: Secondary | ICD-10-CM | POA: Diagnosis not present

## 2015-12-09 DIAGNOSIS — Z5189 Encounter for other specified aftercare: Secondary | ICD-10-CM

## 2015-12-09 MED ORDER — ZOLPIDEM TARTRATE 5 MG PO TABS: 5.0000 mg | ORAL_TABLET | Freq: Every evening | ORAL | 0 refills | Status: DC | PRN

## 2015-12-09 NOTE — Progress Notes (Signed)
Pre visit review using our clinic review tool, if applicable. No additional management support is needed unless otherwise documented below in the visit note. 

## 2015-12-09 NOTE — Progress Notes (Signed)
Subjective:    Patient ID: Debra Perez, female    DOB: 1955/04/10, 60 y.o.   MRN: [id number]  HPI  Debra Perez is a 60 year old female with a history of paroxysmal atrial fibrillation who presents today for TCM hospital follow up. She presented to [facility] ED with a chief complaint of chest tightness with radiation into her back and down her left upper extremity. She underwent chest xray, labs, ECG. ECG was unchanged, xray with atelectasis/scarring, and initial Troponin negative. Given her risk factors she was admitted for monitoring and further testing/cardiac work up.  During her hospitalization she underwent CT angio (negative for PE, mild enlargement of pulmonary artery, coronary artery atherosclerosis). She was consulted by cardiology who believed symptoms to be respiratory in nature and recommended treatment for likely lung infection. They also provided her with low dose lasix for possible diastolic dysfunction given shortness of breath and cough. She did experience wheezing and was initiated on Xopenox treatments. Her serial Troponin levels were negative during her stay. Her symptoms were considered secondary to bronchitis. She was discharged home on 09/30 with prescriptions for azithromycin, prednisone, and Xopenex inhaler.  Since her discharge home she's feeling better. She's feeling some tightness into her chest with a mild cough. She has been compliant to her Zpak and Prednisone. She's been using her Xopenox TID PRN. Denies wheezing, fevers. The most bothersome symptom she's had since being home is difficulty sleeping. She's tried benadryl and melatonin OTC without improvement. She was provided with Ambien in the hospital with improvement in sleep, so she is requesting a few tablets to help with sleep.   She was notified by cardiology that she did not need a stress test at this time.  Review of Systems  Constitutional: Negative for chills, fatigue and fever.  HENT: Positive for  postnasal drip and rhinorrhea. Negative for congestion, sinus pressure and sore throat.   Respiratory: Positive for cough and chest tightness. Negative for shortness of breath and wheezing.   Cardiovascular: Negative for chest pain and palpitations.  Neurological: Negative for weakness.       Past Medical History:  Diagnosis Date  . Atrial fibrillation ([place])   . 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 Diabetes mellitus without complication ([place])   . GERD (gastroesophageal reflux disease)   . History of colonic polyps    benign  . Hyperlipidemia   . Hypertension   . Lung infection   . Mitral valve prolapse   . Obstructive sleep apnea    on CPAP  . Sleep apnea   . Thyroid disease   . Ventricular tachycardia (paroxysmal) [facility])      Social History   Social History  . Marital status: Married    Spouse name: N/A  . Number of children: N/A  . Years of education: N/A   Occupational History  . Not on file.   Social History Main Topics  . Smoking status: Former Smoker    Packs/day: 1.00    Years: 20.00    Types: Cigarettes    Quit date: 03/07/2005  . Smokeless tobacco: Never Used  . Alcohol use No  . Drug use: No  . Sexual activity: Not on file   Other Topics Concern  . Not on file   Social History Narrative   Married.   1 daughter.   Moved here from [place] due to husbands occupation.   She is a Corporate treasurer.   Enjoys sewing, gardening.  Past Surgical History:  Procedure Laterality Date  . ABDOMINAL HYSTERECTOMY  2002   ovaries remain  . CLAVICLE SURGERY  2007   right clavicle plate pins  . KNEE ARTHROSCOPY  2012    Family History  Problem Relation Age of Onset  . Depression Mother   . Sudden death Mother 18  . Heart attack Mother   . Arthritis Father   . Heart disease Father   . Hypertension Father   . Diabetes Sister   . Diabetes Brother   . Diabetes Maternal Grandfather     No Known Allergies  Current Outpatient Prescriptions on File Prior to  Visit  Medication Sig Dispense Refill  . [name] 50 MG tablet Take 1 tablet (50 mg total) by mouth at bedtime. 90 tablet 3  . aspirin EC 325 MG tablet Take 1 tablet (325 mg total) by mouth daily. 30 tablet 0  . azithromycin (ZITHROMAX) 250 MG tablet Daily for 4 days 6 each 0  . b complex vitamins capsule Take 1 capsule by mouth 2 (two) times daily.    Marland Kitchen co-enzyme Q-10 50 MG capsule Take 50 mg by mouth daily.    Marland Kitchen docusate sodium (COLACE) 100 MG capsule Take 100 mg by mouth daily as needed.     . ezetimibe (ZETIA) 10 MG tablet Take 1 tablet (10 mg total) by mouth daily. 90 tablet 3  . glipiZIDE (GLUCOTROL XL) 2.5 MG 24 hr tablet Take 1 tablet (2.5 mg total) by mouth daily with breakfast. 90 tablet 3  . levalbuterol (XOPENEX HFA) 45 MCG/ACT inhaler Inhale 1 puff into the lungs every 6 (six) hours as needed for wheezing. 1 Inhaler 2  . levothyroxine (SYNTHROID, LEVOTHROID) 88 MCG tablet Take 1 tablet (88 mcg total) by mouth daily before breakfast. 90 tablet 3  . Magnesium 500 MG CAPS Take 1 capsule by mouth 2 (two) times daily.    . meloxicam (MOBIC) 15 MG tablet TAKE 1 BY MOUTH DAILY AS NEEDED 90 tablet 0  . Multiple Vitamins-Minerals (MULTIVITAMIN ADULT PO) Take 1 capsule by mouth 2 (two) times daily.     . Omega-3 Fatty Acids (FISH OIL) 1200 MG CAPS Take 1 capsule by mouth daily after breakfast.    . omeprazole (PRILOSEC) 20 MG capsule Take 1 capsule (20 mg total) by mouth 2 (two) times daily before a meal. 180 capsule 3  . pravastatin (PRAVACHOL) 20 MG tablet Take 1 tablet (20 mg total) by mouth daily. 90 tablet 3  . predniSONE (STERAPRED UNI-PAK 21 TAB) 10 MG (21) TBPK tablet Take 1 tablet (10 mg total) by mouth daily. 21 tablet 0  . propafenone (RYTHMOL SR) 225 MG 12 hr capsule Take 1 capsule (225 mg total) by mouth 2 (two) times daily. 180 capsule 3  . quinapril (ACCUPRIL) 20 MG tablet Take 1 tablet (20 mg total) by mouth at bedtime. 90 tablet 3  . sitaGLIPtin-metformin  (JANUMET) 50-1000 MG per tablet Take 1 tablet by mouth 2 (two) times daily with a meal.    . venlafaxine XR (EFFEXOR XR) 37.5 MG 24 hr capsule Take 1 capsule (37.5 mg total) by mouth daily with breakfast. 90 capsule 2  . verapamil (CALAN-SR) 120 MG CR tablet Take 1 tablet (120 mg total) by mouth at bedtime. (Patient taking differently: Take 120 mg by mouth daily after breakfast. ) 90 tablet 3   No current facility-administered medications on file prior to visit.     BP (!) 146/94   Pulse 71  Temp 97.4 F (36.3 C) (Oral)   Ht 5\' 6"  (1.676 m)   Wt 176 lb 12.8 oz (80.2 kg)   SpO2 95%   BMI 28.54 kg/m    Objective:   Physical Exam  Constitutional: She appears well-nourished.  HENT:  Right Ear: Tympanic membrane and ear canal normal.  Left Ear: Tympanic membrane and ear canal normal.  Nose: Right sinus exhibits no maxillary sinus tenderness and no frontal sinus tenderness. Left sinus exhibits no maxillary sinus tenderness and no frontal sinus tenderness.  Mouth/Throat: Oropharynx is clear and moist.  Eyes: Conjunctivae are normal.  Neck: Neck supple.  Cardiovascular: Normal rate and regular rhythm.   Pulmonary/Chest: Effort normal and breath sounds normal. She has no wheezes. She has no rales.  Lymphadenopathy:    She has no cervical adenopathy.  Skin: Skin is warm and dry.          Assessment & Plan:  [facility] Follow Up:  Ad[facility] to [facility] 09/28 with complaints of chest pain and SOB. Negative cardiac work up during hospitalization. Determined symptoms secondary to bronchitis. Discharged home on 09/30 with Zpak, Prednisone, Xopenex. Overall doing well since discharge home, some chest tightness. Compliant to medications provided upon discharge. Exam with clear lungs, oxygen saturation and HR stable. Appears well. Prednisone likely the cause for her insomnia.  Will provided patient with short term, temporary supply of Ambien given lack of improvement with OTC  treatment. Discussed use of Xopenex, delsym DM PRN. She will follow up PRN.  All hospital notes, labs, imaging reviewed.  Sheral Flow, NP

## 2015-12-09 NOTE — Patient Instructions (Addendum)
Use the Xopenex inhaler every 6 hours as needed for wheezing and shortness of breath.  Take some Delsym DM cough medication with you on your trip to the beach.  Take the Ambien tablets as needed for difficulty sleeping.  Please call or e-mail me if you need anything.  It was a pleasure to see you today!

## 2015-12-21 ENCOUNTER — Encounter: Payer: Self-pay | Admitting: Primary Care

## 2015-12-30 ENCOUNTER — Other Ambulatory Visit: Payer: Self-pay | Admitting: *Deleted

## 2015-12-30 MED ORDER — PRAVASTATIN SODIUM 20 MG PO TABS: 20.0000 mg | ORAL_TABLET | Freq: Every day | ORAL | 3 refills | Status: DC

## 2016-01-01 ENCOUNTER — Other Ambulatory Visit: Payer: Self-pay | Admitting: Primary Care

## 2016-01-01 MED ORDER — OMEPRAZOLE 20 MG PO CPDR: 20.0000 mg | DELAYED_RELEASE_CAPSULE | Freq: Two times a day (BID) | ORAL | 3 refills | Status: DC

## 2016-01-05 ENCOUNTER — Other Ambulatory Visit: Payer: Self-pay | Admitting: Primary Care

## 2016-01-05 DIAGNOSIS — E119 Type 2 diabetes mellitus without complications: Secondary | ICD-10-CM

## 2016-01-05 MED ORDER — SITAGLIPTIN PHOS-METFORMIN HCL 50-1000 MG PO TABS: 1.0000 | ORAL_TABLET | Freq: Two times a day (BID) | ORAL | 3 refills | Status: DC

## 2016-01-05 NOTE — Telephone Encounter (Signed)
Received faxed refill request for  sitaGLIPtin-metformin (JANUMET) 50-1000 MG per tablet  Medication have not been prescribed by Anda [facility]. Last seen on 12/09/2015.

## 2016-01-13 ENCOUNTER — Ambulatory Visit (INDEPENDENT_AMBULATORY_CARE_PROVIDER_SITE_OTHER): Payer: Managed Care, Other (non HMO) | Admitting: Primary Care

## 2016-01-13 ENCOUNTER — Encounter: Payer: Self-pay | Admitting: Primary Care

## 2016-01-13 VITALS — BP 152/98 | HR 63 | Temp 98.3°F | Ht 66.0 in | Wt 180.1 lb

## 2016-01-13 DIAGNOSIS — H9202 Otalgia, left ear: Secondary | ICD-10-CM

## 2016-01-13 NOTE — Patient Instructions (Signed)
Your symptoms are representative of a viral illness which will resolve on its own over time. Our goal is to treat your symptoms in order to aid your body in the healing process and to make you more comfortable.   Nasal Congestion/Ear Pressure: Try using Flonase (fluticasone) nasal spray. Instill 1 spray in each nostril twice daily.   Continue Zyrtec and Advil.   Please notify me if you develop persistent fevers of 101, start coughing up green mucous, notice increased fatigue or weakness, or feel worse after 1 week of onset of symptoms.   Increase consumption of water intake and rest.  It was a pleasure to see you today!

## 2016-01-13 NOTE — Progress Notes (Signed)
Pre visit review using our clinic review tool, if applicable. No additional management support is needed unless otherwise documented below in the visit note. 

## 2016-01-13 NOTE — Progress Notes (Signed)
Subjective:    Patient ID: Debra Perez, female    DOB: Apr 03, 1955, 60 y.o.   MRN: [id number]  HPI  Debra Perez is a 60 year old female who presents today with a chief complaint of ear pain. She also reports cough. Her pain is located to the left ear for which is intermittent and sharp in nature. Her symptoms began Sunday (3 days ago). She denies sore throat, fevers. She's taken Claritin-D and Advil PM with improvement.  Review of Systems  Constitutional: Negative for chills, fatigue and fever.  HENT: Positive for congestion, ear pain and sinus pressure. Negative for sore throat.   Respiratory: Positive for cough.   Gastrointestinal: Negative for nausea.       Past Medical History:  Diagnosis Date  . Atrial fibrillation ([place])   . 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 Diabetes mellitus without complication ([employer])   . GERD (gastroesophageal reflux disease)   . History of colonic polyps    benign  . Hyperlipidemia   . Hypertension   . Lung infection   . Mitral valve prolapse   . Obstructive sleep apnea    on CPAP  . Sleep apnea   . Thyroid disease   . Ventricular tachycardia (paroxysmal) [facility])      Social History   Social History  . Marital status: Married    Spouse name: N/A  . Number of children: N/A  . Years of education: N/A   Occupational History  . Not on file.   Social History Main Topics  . Smoking status: Former Smoker    Packs/day: 1.00    Years: 20.00    Types: Cigarettes    Quit date: 03/07/2005  . Smokeless tobacco: Never Used  . Alcohol use No  . Drug use: No  . Sexual activity: Not on file   Other Topics Concern  . Not on file   Social History Narrative   Married.   1 daughter.   Moved here from [place] due to husbands occupation.   She is a Corporate treasurer.   Enjoys sewing, gardening.    Past Surgical History:  Procedure Laterality Date  . ABDOMINAL HYSTERECTOMY  2002   ovaries remain  . CLAVICLE SURGERY  2007   right clavicle  plate pins  . KNEE ARTHROSCOPY  2012    Family History  Problem Relation Age of Onset  . Depression Mother   . Sudden death Mother 72  . Heart attack Mother   . Arthritis Father   . Heart disease Father   . Hypertension Father   . Diabetes Sister   . Diabetes Brother   . Diabetes Maternal Grandfather     No Known Allergies  Current Outpatient Prescriptions on File Prior to Visit  Medication Sig Dispense Refill  . [name] 50 MG tablet Take 1 tablet (50 mg total) by mouth at bedtime. 90 tablet 3  . aspirin EC 325 MG tablet Take 1 tablet (325 mg total) by mouth daily. 30 tablet 0  . b complex vitamins capsule Take 1 capsule by mouth 2 (two) times daily.    Marland Kitchen co-enzyme Q-10 50 MG capsule Take 50 mg by mouth daily.    Marland Kitchen docusate sodium (COLACE) 100 MG capsule Take 100 mg by mouth daily as needed.     . ezetimibe (ZETIA) 10 MG tablet Take 1 tablet (10 mg total) by mouth daily. 90 tablet 3  . glipiZIDE (GLUCOTROL XL) 2.5 MG 24 hr tablet  Take 1 tablet (2.5 mg total) by mouth daily with breakfast. 90 tablet 3  . levalbuterol (XOPENEX HFA) 45 MCG/ACT inhaler Inhale 1 puff into the lungs every 6 (six) hours as needed for wheezing. 1 Inhaler 2  . levothyroxine (SYNTHROID, LEVOTHROID) 88 MCG tablet Take 1 tablet (88 mcg total) by mouth daily before breakfast. 90 tablet 3  . Magnesium 500 MG CAPS Take 1 capsule by mouth 2 (two) times daily.    . meloxicam (MOBIC) 15 MG tablet TAKE 1 BY MOUTH DAILY AS NEEDED 90 tablet 0  . Multiple Vitamins-Minerals (MULTIVITAMIN ADULT PO) Take 1 capsule by mouth 2 (two) times daily.     . Omega-3 Fatty Acids (FISH OIL) 1200 MG CAPS Take 1 capsule by mouth daily after breakfast.    . omeprazole (PRILOSEC) 20 MG capsule Take 1 capsule (20 mg total) by mouth 2 (two) times daily before a meal. 180 capsule 3  . pravastatin (PRAVACHOL) 20 MG tablet Take 1 tablet (20 mg total) by mouth daily. 90 tablet 3  . predniSONE (STERAPRED UNI-PAK 21 TAB) 10 MG  (21) TBPK tablet Take 1 tablet (10 mg total) by mouth daily. 21 tablet 0  . propafenone (RYTHMOL SR) 225 MG 12 hr capsule Take 1 capsule (225 mg total) by mouth 2 (two) times daily. 180 capsule 3  . quinapril (ACCUPRIL) 20 MG tablet Take 1 tablet (20 mg total) by mouth at bedtime. 90 tablet 3  . sitaGLIPtin-metformin (JANUMET) 50-1000 MG tablet Take 1 tablet by mouth 2 (two) times daily with a meal. 180 tablet 3  . venlafaxine XR (EFFEXOR XR) 37.5 MG 24 hr capsule Take 1 capsule (37.5 mg total) by mouth daily with breakfast. 90 capsule 2  . verapamil (CALAN-SR) 120 MG CR tablet Take 1 tablet (120 mg total) by mouth at bedtime. (Patient taking differently: Take 120 mg by mouth daily after breakfast. ) 90 tablet 3  . zolpidem (AMBIEN) 5 MG tablet Take 1 tablet (5 mg total) by mouth at bedtime as needed for sleep. 8 tablet 0   No current facility-administered medications on file prior to visit.     BP (!) 152/98   Pulse 63   Temp 98.3 F (36.8 C) (Oral)   Ht 5\' 6"  (1.676 m)   Wt 180 lb 1.9 oz (81.7 kg)   SpO2 98%   BMI 29.07 kg/m    Objective:   Physical Exam  Constitutional: She appears well-nourished.  HENT:  Right Ear: Tympanic membrane and ear canal normal.  Left Ear: Ear canal normal. A middle ear effusion is present.  Nose: Mucosal edema present. Right sinus exhibits maxillary sinus tenderness and frontal sinus tenderness. Left sinus exhibits maxillary sinus tenderness and frontal sinus tenderness.  Mouth/Throat: Oropharynx is clear and moist.  Eyes: Conjunctivae are normal.  Neck: Neck supple.  Cardiovascular: Normal rate and regular rhythm.   Pulmonary/Chest: Effort normal and breath sounds normal. She has no wheezes. She has no rales.  Lymphadenopathy:    She has no cervical adenopathy.  Skin: Skin is warm and dry.          Assessment & Plan:  Otalgia:  Located to left ear x 3 days. Also with dry cough. Improvement with Claritin-D and Advil. Exam today with mild  effusion to left TM, other wise lungs clear, does not appear acutely ill. Discussed to stop Claritin-D as this is increasing her BP. Start Flonase, Zyrtec, continue Advil. Fluids, rest. Return precautions provided.  Sheral Flow, NP

## 2016-01-27 ENCOUNTER — Encounter: Payer: Self-pay | Admitting: Primary Care

## 2016-01-31 ENCOUNTER — Encounter: Payer: Self-pay | Admitting: Primary Care

## 2016-02-01 ENCOUNTER — Encounter: Payer: Self-pay | Admitting: Primary Care

## 2016-02-18 ENCOUNTER — Other Ambulatory Visit: Payer: Self-pay

## 2016-02-18 MED ORDER — EZETIMIBE 10 MG PO TABS: 10.0000 mg | ORAL_TABLET | Freq: Every day | ORAL | 3 refills | Status: DC

## 2016-02-18 MED ORDER — PROPAFENONE HCL ER 225 MG PO CP12: 225.0000 mg | ORAL_CAPSULE | Freq: Two times a day (BID) | ORAL | 3 refills | Status: DC

## 2016-02-18 MED ORDER — QUINAPRIL HCL 20 MG PO TABS: 20.0000 mg | ORAL_TABLET | Freq: Every day | ORAL | 3 refills | Status: DC

## 2016-02-18 MED ORDER — VERAPAMIL HCL ER 120 MG PO TBCR: 120.0000 mg | EXTENDED_RELEASE_TABLET | Freq: Every day | ORAL | 3 refills | Status: DC

## 2016-02-19 ENCOUNTER — Other Ambulatory Visit: Payer: Self-pay

## 2016-02-19 MED ORDER — LEVOTHYROXINE SODIUM 88 MCG PO TABS
88.0000 ug | ORAL_TABLET | Freq: Every day | ORAL | 2 refills | Status: DC
Start: 2016-02-19 — End: 2016-11-18

## 2016-02-19 MED ORDER — GLUCOSE BLOOD VI STRP: ORAL_STRIP | 3 refills | Status: DC

## 2016-02-19 NOTE — Addendum Note (Signed)
Addended by: Pilar Grammes on: 02/19/2016 03:56 PM   Modules accepted: Orders

## 2016-02-19 NOTE — Telephone Encounter (Signed)
Rx sent electronically.  

## 2016-03-28 ENCOUNTER — Other Ambulatory Visit: Payer: Self-pay | Admitting: Primary Care

## 2016-03-28 NOTE — Telephone Encounter (Signed)
Ok to refill? Electronically refill request for   [name] 50 MG tablet #90 with 3 refills  Last prescribed on 02/04/2015. Last seen on 01/13/2016.

## 2016-04-12 ENCOUNTER — Telehealth: Payer: Self-pay | Admitting: Cardiovascular Disease

## 2016-04-12 NOTE — Telephone Encounter (Signed)
°*  STAT* If patient is at the pharmacy, call can be transferred to refill team.   1. Which medications need to be refilled? (please list name of each medication and dose if known)   Propafenone 225 mg PO BID  2. Which pharmacy/location (including street and city if local pharmacy) is medication to be sent to? [facility] mail order  3. Do they need a 30 day or 90 day supply?  [place]

## 2016-04-12 NOTE — Telephone Encounter (Signed)
Called patient to ask about her refill request for propafenone 225mg , as there was a refill sent to [facility] mail service in 02/2016 of a 90 day supply, along with refills. Pt states she only received a one month supply. Pt states she is going to call [employer] service and call our office back.

## 2016-04-14 ENCOUNTER — Other Ambulatory Visit: Payer: Self-pay | Admitting: Primary Care

## 2016-04-14 DIAGNOSIS — R232 Flushing: Secondary | ICD-10-CM

## 2016-04-15 ENCOUNTER — Other Ambulatory Visit: Payer: Self-pay | Admitting: Cardiovascular Disease

## 2016-05-03 ENCOUNTER — Ambulatory Visit (INDEPENDENT_AMBULATORY_CARE_PROVIDER_SITE_OTHER): Payer: Managed Care, Other (non HMO) | Admitting: Primary Care

## 2016-05-03 ENCOUNTER — Encounter: Payer: Self-pay | Admitting: Primary Care

## 2016-05-03 VITALS — BP 140/88 | HR 72 | Temp 97.8°F | Ht 66.0 in | Wt 177.1 lb

## 2016-05-03 DIAGNOSIS — R300 Dysuria: Secondary | ICD-10-CM | POA: Diagnosis not present

## 2016-05-03 DIAGNOSIS — I10 Essential (primary) hypertension: Secondary | ICD-10-CM | POA: Diagnosis not present

## 2016-05-03 LAB — POC URINALSYSI DIPSTICK (AUTOMATED)
BILIRUBIN UA: NEGATIVE
Glucose, UA: NEGATIVE
KETONES UA: NEGATIVE
Nitrite, UA: NEGATIVE
PH UA: 6
PROTEIN UA: NEGATIVE
RBC UA: NEGATIVE
SPEC GRAV UA: 1.015
Urobilinogen, UA: NEGATIVE

## 2016-05-03 MED ORDER — CIPROFLOXACIN HCL 250 MG PO TABS
250.0000 mg | ORAL_TABLET | Freq: Two times a day (BID) | ORAL | 0 refills | Status: DC
Start: 2016-05-03 — End: 2016-05-30

## 2016-05-03 NOTE — Addendum Note (Signed)
Addended by: Jacqualin Combes on: 05/03/2016 10:44 AM   Modules accepted: Orders

## 2016-05-03 NOTE — Patient Instructions (Signed)
Start ciprofloxacin antibiotics for urinary tract infection. Take 1 tablet by mouth twice daily for 5 days.  Continue to monitor your blood pressure as discussed.  You are due for both follow up and physical. Please schedule a physical with me in late March/early April 2018. You may also schedule a lab only appointment 3-4 days prior. We will discuss your lab results in detail during your physical.  Hold the Glipizide if your sugars are dropping below 80.   It was a pleasure to see you today!

## 2016-05-03 NOTE — Progress Notes (Signed)
Pre visit review using our clinic review tool, if applicable. No additional management support is needed unless otherwise documented below in the visit note. 

## 2016-05-03 NOTE — Assessment & Plan Note (Signed)
Above goal in the office today, also on several readings in the office. Home readings at 120/80. Will not adjust meds today given normal home readings. Will have her continue to monitor. She is asymptomatic.

## 2016-05-03 NOTE — Progress Notes (Signed)
Subjective:    Patient ID: Debra Perez, female    DOB: Mar 07, 1956, 61 y.o.   MRN: [id number]  HPI  Debra Perez is a 61 year old female with a history of type 2 diabetes, CAD who presents today with a chief complaint of dysuria. She also reports pelvic pressure, urinary retention, hematuria, pelvic pressure, frequency. Her symptoms began on Sunday this past week. She's been increasing her fluid intake and is overall feeling better today. She has noticed left pelvic pressure that has continued.   2) Essential Hypertension: Currently managed on quinapril 20 mg, verapamil 120 CR. Her BP in the clinic today is 140/88. Her last several readings have been above goal in our office. She's checking her BP at home which is 120/80's on average. She denies chest pain, dizziness, shortness of breath.   Review of Systems  Eyes: Negative for visual disturbance.  Respiratory: Negative for shortness of breath.   Cardiovascular: Negative for chest pain.  Genitourinary: Positive for dysuria, frequency, hematuria and pelvic pain. Negative for vaginal discharge.  Neurological: Negative for dizziness and headaches.       Past Medical History:  Diagnosis Date  . Atrial fibrillation ([place])   . 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 Diabetes mellitus without complication ([place])   . GERD (gastroesophageal reflux disease)   . History of colonic polyps    benign  . Hyperlipidemia   . Hypertension   . Lung infection   . Mitral valve prolapse   . Obstructive sleep apnea    on CPAP  . Sleep apnea   . Thyroid disease   . Ventricular tachycardia (paroxysmal) [facility])      Social History   Social History  . Marital status: Married    Spouse name: N/A  . Number of children: N/A  . Years of education: N/A   Occupational History  . Not on file.   Social History Main Topics  . Smoking status: Former Smoker    Packs/day: 1.00    Years: 20.00    Types: Cigarettes    Quit date: 03/07/2005  .  Smokeless tobacco: Never Used  . Alcohol use No  . Drug use: No  . Sexual activity: Not on file   Other Topics Concern  . Not on file   Social History Narrative   Married.   1 daughter.   Moved here from [place] due to husbands occupation.   She is a Corporate treasurer.   Enjoys sewing, gardening.    Past Surgical History:  Procedure Laterality Date  . ABDOMINAL HYSTERECTOMY  2002   ovaries remain  . CLAVICLE SURGERY  2007   right clavicle plate pins  . KNEE ARTHROSCOPY  2012    Family History  Problem Relation Age of Onset  . Depression Mother   . Sudden death Mother 71  . Heart attack Mother   . Arthritis Father   . Heart disease Father   . Hypertension Father   . Diabetes Sister   . Diabetes Brother   . Diabetes Maternal Grandfather     No Known Allergies  Current Outpatient Prescriptions on File Prior to Visit  Medication Sig Dispense Refill  . [name] 50 MG tablet TAKE 1 TABLET BY MOUTH AT BEDTIME 90 tablet 3  . aspirin EC 325 MG tablet Take 1 tablet (325 mg total) by mouth daily. 30 tablet 0  . b complex vitamins capsule Take 1 capsule by mouth 2 (two) times daily.    Marland Kitchen  co-enzyme Q-10 50 MG capsule Take 50 mg by mouth daily.    Marland Kitchen docusate sodium (COLACE) 100 MG capsule Take 100 mg by mouth daily as needed.     . ezetimibe (ZETIA) 10 MG tablet Take 1 tablet (10 mg total) by mouth daily. 90 tablet 3  . glipiZIDE (GLUCOTROL XL) 2.5 MG 24 hr tablet Take 1 tablet (2.5 mg total) by mouth daily with breakfast. 90 tablet 3  . glucose blood (ACCU-CHEK AVIVA) test strip Use to check blood sugar 1-2 times a day 200 each 3  . levalbuterol (XOPENEX HFA) 45 MCG/ACT inhaler Inhale 1 puff into the lungs every 6 (six) hours as needed for wheezing. 1 Inhaler 2  . levothyroxine (SYNTHROID, LEVOTHROID) 88 MCG tablet Take 1 tablet (88 mcg total) by mouth daily before breakfast. 90 tablet 2  . Magnesium 500 MG CAPS Take 1 capsule by mouth 2 (two) times daily.    . meloxicam  (MOBIC) 15 MG tablet TAKE 1 BY MOUTH DAILY AS NEEDED 90 tablet 0  . Multiple Vitamins-Minerals (MULTIVITAMIN ADULT PO) Take 1 capsule by mouth 2 (two) times daily.     . Omega-3 Fatty Acids (FISH OIL) 1200 MG CAPS Take 1 capsule by mouth daily after breakfast.    . omeprazole (PRILOSEC) 20 MG capsule Take 1 capsule (20 mg total) by mouth 2 (two) times daily before a meal. 180 capsule 3  . pravastatin (PRAVACHOL) 20 MG tablet Take 1 tablet (20 mg total) by mouth daily. 90 tablet 3  . propafenone (RYTHMOL SR) 225 MG 12 hr capsule TAKE 1 CAPSULE(225 MG) BY MOUTH TWICE DAILY 180 capsule 0  . quinapril (ACCUPRIL) 20 MG tablet Take 1 tablet (20 mg total) by mouth at bedtime. 90 tablet 3  . sitaGLIPtin-metformin (JANUMET) 50-1000 MG tablet Take 1 tablet by mouth 2 (two) times daily with a meal. 180 tablet 3  . venlafaxine XR (EFFEXOR-XR) 37.5 MG 24 hr capsule TAKE ONE CAPSULE BY MOUTH DAILY WITH BREAKFAST 90 capsule 1  . verapamil (CALAN-SR) 120 MG CR tablet Take 1 tablet (120 mg total) by mouth at bedtime. 90 tablet 3  . zolpidem (AMBIEN) 5 MG tablet Take 1 tablet (5 mg total) by mouth at bedtime as needed for sleep. 8 tablet 0   No current facility-administered medications on file prior to visit.     BP 140/88   Pulse 72   Temp 97.8 F (36.6 C) (Oral)   Ht 5\' 6"  (1.676 m)   Wt 177 lb 1.9 oz (80.3 kg)   SpO2 98%   BMI 28.59 kg/m    Objective:   Physical Exam  Constitutional: She appears well-nourished.  Cardiovascular: Normal rate and regular rhythm.   Pulmonary/Chest: Effort normal and breath sounds normal.  Abdominal: There is tenderness in the suprapubic area. There is no CVA tenderness.  Skin: Skin is warm and dry.          Assessment & Plan:  UTI:  Dysuria, hematuria, pelvic pressure x 3 days. Overall improved with increase in water intake. Exam today stable. UA today: 2+ leuks, negative nitrites, no blood. Culture sent. Given examination and symptoms, will treat. Rx  for Cipro 250 mg course sent to pharmacy. Continue to push water intake, follow up PRN.  Sheral Flow, NP

## 2016-05-04 ENCOUNTER — Encounter: Payer: Self-pay | Admitting: Primary Care

## 2016-05-04 ENCOUNTER — Other Ambulatory Visit: Payer: Self-pay | Admitting: Primary Care

## 2016-05-04 DIAGNOSIS — Z1239 Encounter for other screening for malignant neoplasm of breast: Secondary | ICD-10-CM

## 2016-05-04 LAB — URINE CULTURE

## 2016-05-11 ENCOUNTER — Other Ambulatory Visit: Payer: Self-pay | Admitting: Primary Care

## 2016-05-11 DIAGNOSIS — Z1231 Encounter for screening mammogram for malignant neoplasm of breast: Secondary | ICD-10-CM

## 2016-05-30 ENCOUNTER — Other Ambulatory Visit: Payer: Self-pay | Admitting: Primary Care

## 2016-05-30 ENCOUNTER — Ambulatory Visit (INDEPENDENT_AMBULATORY_CARE_PROVIDER_SITE_OTHER): Payer: Managed Care, Other (non HMO) | Admitting: Primary Care

## 2016-05-30 ENCOUNTER — Encounter: Payer: Self-pay | Admitting: Primary Care

## 2016-05-30 VITALS — BP 136/88 | HR 82 | Temp 98.2°F | Ht 66.0 in | Wt 176.0 lb

## 2016-05-30 DIAGNOSIS — M5441 Lumbago with sciatica, right side: Secondary | ICD-10-CM

## 2016-05-30 DIAGNOSIS — E119 Type 2 diabetes mellitus without complications: Secondary | ICD-10-CM

## 2016-05-30 MED ORDER — PREDNISONE 10 MG PO TABS: ORAL_TABLET | ORAL | 0 refills | Status: DC

## 2016-05-30 MED ORDER — CYCLOBENZAPRINE HCL 5 MG PO TABS: 5.0000 mg | ORAL_TABLET | Freq: Two times a day (BID) | ORAL | 0 refills | Status: DC | PRN

## 2016-05-30 MED ORDER — METHYLPREDNISOLONE ACETATE 80 MG/ML IJ SUSP
80.0000 mg | Freq: Once | INTRAMUSCULAR | Status: AC
  Administered 2016-05-30: 80 mg via INTRAMUSCULAR

## 2016-05-30 NOTE — Progress Notes (Signed)
Subjective:    Patient ID: Debra Perez, female    DOB: [birth year], 62 y.o.   MRN: [id number]  HPI  Debra Perez is a 61 year old female with a history of type 2 diabetes, obesity who presents today with a chief complaint of low back pain. Her pain is located to the right lower back with intermittent radiation of pain to her right lower extremity down through the knee. She has noticed intermittent numbness through the knee as well. Her lower back pain has been present for several weeks with increased pain and numbness that began two days ago. She's not slept in the past two days. She's tried repositioning, taking Ibuprofen and methocarbamol, heating pad/ice application without any improvement. She denies recent injury/trauma, weakness.   Review of Systems  Genitourinary: Negative for dysuria and frequency.  Musculoskeletal: Positive for back pain.  Neurological: Positive for numbness. Negative for weakness.       Past Medical History:  Diagnosis Date  . Atrial fibrillation ([place])   . 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 Diabetes mellitus without complication ([place])   . GERD (gastroesophageal reflux disease)   . History of colonic polyps    benign  . Hyperlipidemia   . Hypertension   . Lung infection   . Mitral valve prolapse   . Obstructive sleep apnea    on CPAP  . Sleep apnea   . Thyroid disease   . [facility]ysmal) [facility])      Social History   Social History  . Marital status: Married    Spouse name: N/A  . Number of children: N/A  . Years of education: N/A   Occupational History  . Not on file.   Social History Main Topics  . Smoking status: Former Smoker    Packs/day: 1.00    Years: 20.00    Types: Cigarettes    Quit date: 03/07/2005  . Smokeless tobacco: Never Used  . Alcohol use No  . Drug use: No  . Sexual activity: Not on file   Other Topics Concern  . Not on file   Social History Narrative   Married.   1 daughter.   Moved here from [place] due to husbands occupation.   She is a Corporate treasurer.   Enjoys sewing, gardening.    Past Surgical History:  Procedure Laterality Date  . ABDOMINAL HYSTERECTOMY  2002   ovaries remain  . CLAVICLE SURGERY  2007   right clavicle plate pins  . KNEE ARTHROSCOPY  2012    Family History  Problem Relation Age of Onset  . Depression Mother   . Sudden death Mother 17  . Heart attack Mother   . Arthritis Father   . Heart disease Father   . Hypertension Father   . Diabetes Sister   . Diabetes Brother   . Diabetes Maternal Grandfather     No Known Allergies  Current Outpatient Prescriptions on File Prior to Visit  Medication Sig Dispense Refill  . [name] 50 MG tablet TAKE 1 TABLET BY MOUTH AT BEDTIME 90 tablet 3  . aspirin EC 325 MG tablet Take 1 tablet (325 mg total) by mouth daily. 30 tablet 0  . b complex vitamins capsule Take 1 capsule by mouth 2 (two) times daily.    Marland Kitchen co-enzyme Q-10 50 MG capsule Take 50 mg by mouth daily.    Marland Kitchen ezetimibe (ZETIA) 10 MG tablet Take 1 tablet (10 mg total) by mouth daily.  90 tablet 3  . glipiZIDE (GLUCOTROL XL) 2.5 MG 24 hr tablet Take 1 tablet (2.5 mg total) by mouth daily with breakfast. 90 tablet 3  . glucose blood (ACCU-CHEK AVIVA) test strip Use to check blood sugar 1-2 times a day 200 each 3  . levalbuterol (XOPENEX HFA) 45 MCG/ACT inhaler Inhale 1 puff into the lungs every 6 (six) hours as needed for wheezing. 1 Inhaler 2  . levothyroxine (SYNTHROID, LEVOTHROID) 88 MCG tablet Take 1 tablet (88 mcg total) by mouth daily before breakfast. 90 tablet 2  . Magnesium 500 MG CAPS Take 1 capsule by mouth 2 (two) times daily.    . meloxicam (MOBIC) 15 MG tablet TAKE 1 BY MOUTH DAILY AS NEEDED 90 tablet 0  . Multiple Vitamins-Minerals (MULTIVITAMIN ADULT PO) Take 1 capsule by mouth 2 (two) times daily.     . Omega-3 Fatty Acids (FISH OIL) 1200 MG CAPS Take 1 capsule by mouth daily after breakfast.    . omeprazole  (PRILOSEC) 20 MG capsule Take 1 capsule (20 mg total) by mouth 2 (two) times daily before a meal. 180 capsule 3  . pravastatin (PRAVACHOL) 20 MG tablet Take 1 tablet (20 mg total) by mouth daily. 90 tablet 3  . propafenone (RYTHMOL SR) 225 MG 12 hr capsule TAKE 1 CAPSULE(225 MG) BY MOUTH TWICE DAILY 180 capsule 0  . quinapril (ACCUPRIL) 20 MG tablet Take 1 tablet (20 mg total) by mouth at bedtime. 90 tablet 3  . sitaGLIPtin-metformin (JANUMET) 50-1000 MG tablet Take 1 tablet by mouth 2 (two) times daily with a meal. 180 tablet 3  . venlafaxine XR (EFFEXOR-XR) 37.5 MG 24 hr capsule TAKE ONE CAPSULE BY MOUTH DAILY WITH BREAKFAST 90 capsule 1  . verapamil (CALAN-SR) 120 MG CR tablet Take 1 tablet (120 mg total) by mouth at bedtime. 90 tablet 3  . zolpidem (AMBIEN) 5 MG tablet Take 1 tablet (5 mg total) by mouth at bedtime as needed for sleep. 8 tablet 0   No current facility-administered medications on file prior to visit.     BP 136/88   Pulse 82   Temp 98.2 F (36.8 C) (Oral)   Ht 5\' 6"  (1.676 m)   Wt 176 lb (79.8 kg)   SpO2 96%   BMI 28.41 kg/m    Objective:   Physical Exam  Constitutional: She appears well-nourished.  Neck: Neck supple.  Cardiovascular: Normal rate and regular rhythm.   Pulmonary/Chest: Effort normal and breath sounds normal.  Musculoskeletal:       Lumbar back: She exhibits decreased range of motion, tenderness and pain.  Good strength to bilateral lower extremities.  Skin: Skin is warm and dry.          Assessment & Plan:  Acute Low Back Pain:  Located to right lower back x several weeks, worse with radiculopathy x 2 days. Uncomfortable during exam. Symptoms suggestive of sciatic nerve involvement. Given no improvement with NSAID's and muscle relaxants, will treat with prednisone course.  IM Depo Medrol provided today. Low dose prednisone taper provided for subsequent days. Rx for Flexeril provided to use HS.  Continue ice/heat, stretching,  movement. Follow up PRN.  Sheral Flow, NP

## 2016-05-30 NOTE — Addendum Note (Signed)
Addended by: Jacqualin Combes on: 05/30/2016 10:31 AM   Modules accepted: Orders

## 2016-05-30 NOTE — Patient Instructions (Signed)
You were provided with an injection of steroids today.   Start prednisone tablets tomorrow. Take three tablets for 2 days, then two tablets for 2 days, then one tablet for 2 days.  You may take cyclobenzaprine 5 mg tablets twice daily and at bedtime as needed for muscle spasms. Caution as this may cause drowsiness.  Refrain from sitting still for too long as this will cause increased muscle tightness and discomfort.  Monitor your blood sugars with prednisone as this will cause an increase.  It was a pleasure to see you today!

## 2016-05-30 NOTE — Progress Notes (Signed)
Pre visit review using our clinic review tool, if applicable. No additional management support is needed unless otherwise documented below in the visit note. 

## 2016-05-31 ENCOUNTER — Ambulatory Visit
Admission: RE | Admit: 2016-05-31 | Discharge: 2016-05-31 | Disposition: A | Discharge status: Home / Self Care | Payer: Managed Care, Other (non HMO) | Source: Ambulatory Visit | Attending: Primary Care | Admitting: Primary Care

## 2016-05-31 DIAGNOSIS — Z1231 Encounter for screening mammogram for malignant neoplasm of breast: Secondary | ICD-10-CM

## 2016-06-02 ENCOUNTER — Encounter: Payer: Self-pay | Admitting: Primary Care

## 2016-06-06 ENCOUNTER — Encounter: Payer: Self-pay | Admitting: Primary Care

## 2016-06-06 ENCOUNTER — Other Ambulatory Visit: Payer: Self-pay | Admitting: Primary Care

## 2016-06-06 DIAGNOSIS — M5440 Lumbago with sciatica, unspecified side: Secondary | ICD-10-CM

## 2016-06-06 MED ORDER — PREDNISONE 20 MG PO TABS: ORAL_TABLET | ORAL | 0 refills | Status: DC

## 2016-06-14 ENCOUNTER — Other Ambulatory Visit (INDEPENDENT_AMBULATORY_CARE_PROVIDER_SITE_OTHER): Payer: Managed Care, Other (non HMO)

## 2016-06-14 DIAGNOSIS — E119 Type 2 diabetes mellitus without complications: Secondary | ICD-10-CM | POA: Diagnosis not present

## 2016-06-14 LAB — COMPREHENSIVE METABOLIC PANEL
ALBUMIN: 4.1 g/dL (ref 3.5–5.2)
ALT: 23 U/L (ref 0–35)
AST: 20 U/L (ref 0–37)
Alkaline Phosphatase: 42 U/L (ref 39–117)
BILIRUBIN TOTAL: 0.3 mg/dL (ref 0.2–1.2)
BUN: 18 mg/dL (ref 6–23)
CALCIUM: 9.1 mg/dL (ref 8.4–10.5)
CO2: 29 mEq/L (ref 19–32)
CREATININE: 0.87 mg/dL (ref 0.40–1.20)
Chloride: 103 mEq/L (ref 96–112)
GFR: 71 mL/min (ref >60.00)
Glucose, Bld: 159 mg/dL — ABNORMAL HIGH (ref 70–99)
Potassium: 4.3 mEq/L (ref 3.5–5.1)
Sodium: 138 mEq/L (ref 135–145)
Total Protein: 6.8 g/dL (ref 6.0–8.3)

## 2016-06-14 LAB — HEMOGLOBIN A1C: Hgb A1c MFr Bld: 7.5 % — ABNORMAL HIGH (ref 4.6–6.5)

## 2016-06-21 ENCOUNTER — Encounter: Payer: Self-pay | Admitting: Primary Care

## 2016-06-21 ENCOUNTER — Ambulatory Visit (INDEPENDENT_AMBULATORY_CARE_PROVIDER_SITE_OTHER): Payer: Managed Care, Other (non HMO) | Admitting: Primary Care

## 2016-06-21 DIAGNOSIS — R232 Flushing: Secondary | ICD-10-CM | POA: Diagnosis not present

## 2016-06-21 DIAGNOSIS — Z0000 Encounter for general adult medical examination without abnormal findings: Secondary | ICD-10-CM

## 2016-06-21 DIAGNOSIS — E1159 Type 2 diabetes mellitus with other circulatory complications: Secondary | ICD-10-CM

## 2016-06-21 DIAGNOSIS — K219 Gastro-esophageal reflux disease without esophagitis: Secondary | ICD-10-CM | POA: Diagnosis not present

## 2016-06-21 DIAGNOSIS — E039 Hypothyroidism, unspecified: Secondary | ICD-10-CM

## 2016-06-21 DIAGNOSIS — I25119 Atherosclerotic heart disease of native coronary artery with unspecified angina pectoris: Secondary | ICD-10-CM | POA: Diagnosis not present

## 2016-06-21 DIAGNOSIS — I10 Essential (primary) hypertension: Secondary | ICD-10-CM | POA: Diagnosis not present

## 2016-06-21 DIAGNOSIS — E785 Hyperlipidemia, unspecified: Secondary | ICD-10-CM | POA: Diagnosis not present

## 2016-06-21 DIAGNOSIS — I480 Paroxysmal atrial fibrillation: Secondary | ICD-10-CM

## 2016-06-21 NOTE — Assessment & Plan Note (Signed)
Managed on statin and aspirin. No chest pain recently.

## 2016-06-21 NOTE — Assessment & Plan Note (Signed)
TSH in 11/2015 stable, continue levothyroxine 88 mcg.

## 2016-06-21 NOTE — Assessment & Plan Note (Addendum)
Intermittent, manages well with Effexor.

## 2016-06-21 NOTE — Patient Instructions (Addendum)
Hold your Zetia for now. Continue pravastatin.  Continue to monitor your sugars and hold the Glipizide if you continue to drop below 80.  Start exercising. You should be getting 150 minutes of moderate intensity exercise weekly.  Continue your efforts towards a healthy diet. Congratulations on your weight loss!  You are due for your colonoscopy next year.  Follow up in the office in 6 months to recheck A1C and cholesterol.   It was a pleasure to see you today!

## 2016-06-21 NOTE — Assessment & Plan Note (Signed)
Well controlled, will trial off Zetia, recheck in 6 months. Continue pravastatin for heart protection and control.

## 2016-06-21 NOTE — Assessment & Plan Note (Signed)
Immunizations UTD. Mammogram UTD. Colonoscopy UTD, due in 2019. Commended her on her weight loss, encouraged her to continue. Recommended regular exercise. Exam unremarkable. Labs overall stable. Follow up in 1 year.

## 2016-06-21 NOTE — Progress Notes (Signed)
Pre visit review using our clinic review tool, if applicable. No additional management support is needed unless otherwise documented below in the visit note. 

## 2016-06-21 NOTE — Assessment & Plan Note (Signed)
Stable on PPI. Discussed long term effects of PPI use. Will have her reduce to 1 tablet daily.

## 2016-06-21 NOTE — Assessment & Plan Note (Signed)
Regular rate and rhythm today 

## 2016-06-21 NOTE — Assessment & Plan Note (Signed)
Stable in the office today, continue quinapril and verapamil.

## 2016-06-21 NOTE — Progress Notes (Signed)
Subjective:    Patient ID: Debra Perez, female    DOB: 06-May-1955, 60 y.o.   MRN: [id number]  HPI  Debra Perez is a 61 year old female who presents today for complete physical.  She continues to experience low back pain. She was evaluated by orthopedist 1 week ago who recommended MRI and further evaluation.   She's checking her blood sugars are in the low 110's. Her highest number was 180 non fasting. She resumed her Glipizide XL 2.5 mg while taking her prednisone.   Immunizations: -Tetanus: Completed in 2016 -Influenza: Completed in Fall 2017 -Pneumonia: Completed in 2014   Diet: She endorses a healthy diet and has started weight watchers 2 weeks ago. Breakfast: Non fat yogurt with fruit Lunch: Humus and pita, fruit Dinner: Protein, vegetable Snacks: Fudge bars Desserts: Twice weekly Beverages: Water, flavored water, un-sweet tea  Exercise: She is not currently exercising.  Eye exam: Due this month. Dental exam: Follows twice annually. Colonoscopy: Completed in 2014, due in 2019. Pap Smear: Hysterectomy Mammogram: Completed in 2018, normal.   Review of Systems  Constitutional: Negative for unexpected weight change.  HENT: Negative for rhinorrhea.   Respiratory: Negative for cough and shortness of breath.   Cardiovascular: Negative for chest pain.  Gastrointestinal: Negative for constipation and diarrhea.  Genitourinary: Negative for difficulty urinating and menstrual problem.  Musculoskeletal: Positive for back pain. Negative for arthralgias and myalgias.  Skin: Negative for rash.  Allergic/Immunologic: Negative for environmental allergies.  Neurological: Positive for numbness. Negative for dizziness and headaches.  Psychiatric/Behavioral:       She denies concerns for anxiety or depression       Past Medical History:  Diagnosis Date  . Atrial fibrillation ([place])   . 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 Diabetes mellitus without complication  ([place])   . GERD (gastroesophageal reflux disease)   . History of colonic polyps    benign  . Hyperlipidemia   . Hypertension   . Lung infection   . Mitral valve prolapse   . Obstructive sleep apnea    on CPAP  . Sleep apnea   . Thyroid disease   . Ventricular tachycardia (paroxysmal) [facility])      Social History   Social History  . Marital status: Married    Spouse name: N/A  . Number of children: N/A  . Years of education: N/A   Occupational History  . Not on file.   Social History Main Topics  . Smoking status: Former Smoker    Packs/day: 1.00    Years: 20.00    Types: Cigarettes    Quit date: 03/07/2005  . Smokeless tobacco: Never Used  . Alcohol use No  . Drug use: No  . Sexual activity: Not on file   Other Topics Concern  . Not on file   Social History Narrative   Married.   1 daughter.   Moved here from [place] due to husbands occupation.   She is a Corporate treasurer.   Enjoys sewing, gardening.    Past Surgical History:  Procedure Laterality Date  . ABDOMINAL HYSTERECTOMY  2002   ovaries remain  . CLAVICLE SURGERY  2007   right clavicle plate pins  . KNEE ARTHROSCOPY  2012    Family History  Problem Relation Age of Onset  . Depression Mother   . Sudden death Mother 40  . Heart attack Mother   . Arthritis Father   . Heart disease Father   .  Hypertension Father   . Diabetes Sister   . Diabetes Brother   . Diabetes Maternal Grandfather   . Breast cancer Maternal Grandmother     No Known Allergies  Current Outpatient Prescriptions on File Prior to Visit  Medication Sig Dispense Refill  . [name] 50 MG tablet TAKE 1 TABLET BY MOUTH AT BEDTIME 90 tablet 3  . aspirin EC 325 MG tablet Take 1 tablet (325 mg total) by mouth daily. 30 tablet 0  . b complex vitamins capsule Take 1 capsule by mouth 2 (two) times daily.    Marland Kitchen co-enzyme Q-10 50 MG capsule Take 50 mg by mouth daily.    . cyclobenzaprine (FLEXERIL) 5 MG tablet Take 1 tablet (5 mg total)  by mouth 2 (two) times daily as needed for muscle spasms. 15 tablet 0  . ezetimibe (ZETIA) 10 MG tablet Take 1 tablet (10 mg total) by mouth daily. 90 tablet 3  . glipiZIDE (GLUCOTROL XL) 2.5 MG 24 hr tablet Take 1 tablet (2.5 mg total) by mouth daily with breakfast. 90 tablet 3  . glucose blood (ACCU-CHEK AVIVA) test strip Use to check blood sugar 1-2 times a day 200 each 3  . levalbuterol (XOPENEX HFA) 45 MCG/ACT inhaler Inhale 1 puff into the lungs every 6 (six) hours as needed for wheezing. 1 Inhaler 2  . levothyroxine (SYNTHROID, LEVOTHROID) 88 MCG tablet Take 1 tablet (88 mcg total) by mouth daily before breakfast. 90 tablet 2  . Magnesium 500 MG CAPS Take 1 capsule by mouth 2 (two) times daily.    . meloxicam (MOBIC) 15 MG tablet TAKE 1 BY MOUTH DAILY AS NEEDED 90 tablet 0  . Multiple Vitamins-Minerals (MULTIVITAMIN ADULT PO) Take 1 capsule by mouth 2 (two) times daily.     . Omega-3 Fatty Acids (FISH OIL) 1200 MG CAPS Take 1 capsule by mouth daily after breakfast.    . omeprazole (PRILOSEC) 20 MG capsule Take 1 capsule (20 mg total) by mouth 2 (two) times daily before a meal. 180 capsule 3  . pravastatin (PRAVACHOL) 20 MG tablet Take 1 tablet (20 mg total) by mouth daily. 90 tablet 3  . propafenone (RYTHMOL SR) 225 MG 12 hr capsule TAKE 1 CAPSULE(225 MG) BY MOUTH TWICE DAILY 180 capsule 0  . quinapril (ACCUPRIL) 20 MG tablet Take 1 tablet (20 mg total) by mouth at bedtime. 90 tablet 3  . sitaGLIPtin-metformin (JANUMET) 50-1000 MG tablet Take 1 tablet by mouth 2 (two) times daily with a meal. 180 tablet 3  . venlafaxine XR (EFFEXOR-XR) 37.5 MG 24 hr capsule TAKE ONE CAPSULE BY MOUTH DAILY WITH BREAKFAST 90 capsule 1  . verapamil (CALAN-SR) 120 MG CR tablet Take 1 tablet (120 mg total) by mouth at bedtime. 90 tablet 3  . zolpidem (AMBIEN) 5 MG tablet Take 1 tablet (5 mg total) by mouth at bedtime as needed for sleep. 8 tablet 0   No current facility-administered medications on file prior to  visit.     BP 120/78   Pulse 67   Temp 98.4 F (36.9 C) (Oral)   Ht 5\' 6"  (1.676 m)   Wt 171 lb 1.9 oz (77.6 kg)   SpO2 97%   BMI 27.62 kg/m    Objective:   Physical Exam  Constitutional: She is oriented to person, place, and time. She appears well-nourished.  HENT:  Right Ear: Tympanic membrane and ear canal normal.  Left Ear: Tympanic membrane and ear canal normal.  Nose: Nose normal.  Mouth/Throat:  Oropharynx is clear and moist.  Eyes: Conjunctivae and EOM are normal. Pupils are equal, round, and reactive to light.  Neck: Neck supple. No thyromegaly present.  Cardiovascular: Normal rate and regular rhythm.   No murmur heard. Pulmonary/Chest: Effort normal and breath sounds normal. She has no rales.  Abdominal: Soft. Bowel sounds are normal. There is no tenderness.  Musculoskeletal: Normal range of motion.  Lymphadenopathy:    She has no cervical adenopathy.  Neurological: She is alert and oriented to person, place, and time. She has normal reflexes. No cranial nerve deficit.  Skin: Skin is warm and dry. No rash noted.  Psychiatric: She has a normal mood and affect.          Assessment & Plan:

## 2016-06-21 NOTE — Assessment & Plan Note (Addendum)
Recently completed 2 rounds of prednisone, suspect this to have contributed to increase in A1C. Fasting sugars are reducing now. Will have her continue her current regimen and hold Glipizide for sugars less than 80. Continue janumet. Foot exam today unremarkable. Managed on statin and ACE.

## 2016-06-22 LAB — HM DIABETES EYE EXAM

## 2016-06-23 ENCOUNTER — Encounter: Payer: Self-pay | Admitting: Primary Care

## 2016-07-08 ENCOUNTER — Other Ambulatory Visit: Payer: Self-pay | Admitting: Orthopedic Surgery

## 2016-07-08 DIAGNOSIS — M545 Low back pain, unspecified: Secondary | ICD-10-CM

## 2016-07-08 DIAGNOSIS — G8929 Other chronic pain: Secondary | ICD-10-CM

## 2016-07-20 ENCOUNTER — Ambulatory Visit
Admission: RE | Admit: 2016-07-20 | Discharge: 2016-07-20 | Disposition: A | Discharge status: Home / Self Care | Payer: Managed Care, Other (non HMO) | Source: Ambulatory Visit | Attending: Orthopedic Surgery | Admitting: Orthopedic Surgery

## 2016-07-20 ENCOUNTER — Other Ambulatory Visit: Payer: Self-pay | Admitting: Orthopedic Surgery

## 2016-07-20 ENCOUNTER — Ambulatory Visit
Admission: RE | Admit: 2016-07-20 | Discharge: 2016-07-20 | Disposition: A | Discharge status: Home / Self Care | Payer: Self-pay | Source: Ambulatory Visit | Attending: Orthopedic Surgery | Admitting: Orthopedic Surgery

## 2016-07-20 DIAGNOSIS — G8929 Other chronic pain: Secondary | ICD-10-CM

## 2016-07-20 DIAGNOSIS — M545 Low back pain, unspecified: Secondary | ICD-10-CM

## 2016-07-20 MED ORDER — IOPAMIDOL (ISOVUE-M 200) INJECTION 41%
15.0000 mL | Freq: Once | INTRAMUSCULAR | Status: AC
  Administered 2016-07-20: 15 mL via INTRATHECAL

## 2016-07-20 MED ORDER — DIAZEPAM 5 MG PO TABS
10.0000 mg | ORAL_TABLET | Freq: Once | ORAL | Status: AC
  Administered 2016-07-20: 10 mg via ORAL

## 2016-07-20 NOTE — Discharge Instructions (Signed)
Myelogram Discharge Instructions  1. Go home and rest quietly for the next 24 hours.  It is important to lie flat for the next 24 hours.  Get up only to go to the restroom.  You may lie in the bed or on a couch on your back, your stomach, your left side or your right side.  You may have one pillow under your head.  You may have pillows between your knees while you are on your side or under your knees while you are on your back.  2. DO NOT drive today.  Recline the seat as far back as it will go, while still wearing your seat belt, on the way home.  3. You may get up to go to the bathroom as needed.  You may sit up for 10 minutes to eat.  You may resume your normal diet and medications unless otherwise indicated.  Drink lots of extra fluids today and tomorrow.  4. The incidence of headache, nausea, or vomiting is about 5% (one in 20 patients).  If you develop a headache, lie flat and drink plenty of fluids until the headache goes away.  Caffeinated beverages may be helpful.  If you develop severe nausea and vomiting or a headache that does not go away with flat bed rest, call [phone].  5. You may resume normal activities after your 24 hours of bed rest is over; however, do not exert yourself strongly or do any heavy lifting tomorrow. If when you get up you have a headache when standing, go back to bed and force fluids for another 24 hours.  6. Call your physician for a follow-up appointment.  The results of your myelogram will be sent directly to your physician by the following day.  7. If you have any questions or if complications develop after you arrive home, please call [phone].  Discharge instructions have been explained to the patient.  The patient, or the person responsible for the patient, fully understands these instructions.       May resume Elavil and Effexor on Jul 21, 2016, after 1:00 pm.

## 2016-07-20 NOTE — Progress Notes (Signed)
Pt states last dose of Elavil and Effexor on Sunday.

## 2016-07-25 ENCOUNTER — Telehealth: Payer: Self-pay | Admitting: Radiology

## 2016-07-25 NOTE — Telephone Encounter (Signed)
Pt states she has had a positional headache since last week. She feels it might be some better today. Explained blood patch and she wants to wait another day or so to see if it will resolve on it's own.

## 2016-08-03 ENCOUNTER — Encounter: Payer: Self-pay | Admitting: Cardiovascular Disease

## 2016-08-09 ENCOUNTER — Other Ambulatory Visit: Payer: Self-pay

## 2016-08-09 MED ORDER — ROSUVASTATIN CALCIUM 5 MG PO TABS: 5.0000 mg | ORAL_TABLET | Freq: Every day | ORAL | 3 refills | Status: DC

## 2016-08-14 ENCOUNTER — Other Ambulatory Visit: Payer: Self-pay | Admitting: Primary Care

## 2016-08-14 DIAGNOSIS — E119 Type 2 diabetes mellitus without complications: Secondary | ICD-10-CM

## 2016-08-15 NOTE — Telephone Encounter (Signed)
Ok to refill? Electronically refill request for GLIPIZIDE ER 2.5 mg tablet.   Last note stated to hold off on this medication.

## 2016-08-15 NOTE — Telephone Encounter (Signed)
She is holding Glipizide only for sugars below 80. Refill sent.

## 2016-09-01 ENCOUNTER — Other Ambulatory Visit: Payer: Self-pay

## 2016-09-01 NOTE — Telephone Encounter (Signed)
90 DAY SUPPLY

## 2016-09-02 MED ORDER — ROSUVASTATIN CALCIUM 5 MG PO TABS: 5.0000 mg | ORAL_TABLET | Freq: Every day | ORAL | 0 refills | Status: DC

## 2016-09-09 ENCOUNTER — Telehealth: Payer: Self-pay

## 2016-09-09 NOTE — Telephone Encounter (Signed)
-----   Message from Janan Ridge, [place] sent at 09/02/2016  7:22 AM EDT ----- Can you try to schedule a follow up appointment with Dr. Rockey Situ. Thank you!

## 2016-09-09 NOTE — Telephone Encounter (Signed)
lmom to schedule appt

## 2016-09-23 ENCOUNTER — Other Ambulatory Visit: Payer: Self-pay | Admitting: Primary Care

## 2016-09-23 DIAGNOSIS — R232 Flushing: Secondary | ICD-10-CM

## 2016-10-27 NOTE — Progress Notes (Signed)
Patient ID: Debra Perez, female   DOB: [birth year], 61 y.o.   MRN: [id number] Cardiology Office Note  Date:  10/28/2016   ID:  Debra Perez, DOB 1955-08-22, MRN [id number]  PCP:  Debra [name], NP   Chief Complaint  Patient presents with  . other    12 month follow up. Meds reviewed by the pt. verbally. Pt. c/o shortness of breath when climbing stairs and has palpitations frequently. Pt. would like to get a new Rx for Metoprolol.       HPI:  Debra Perez is a pleasant 61 year old woman with history of  Diabetes II Paroxysmal atrial fibrillation, ChadsVASC 4 to 5 nonsustained VT/palpitations,  normal ejection fraction with EF greater than 60%,  mild three-vessel coronary artery disease on CTA scan,  20 years of smoking who stopped 13 years ago,  upper respiratory infection February 2016  atrial fibrillation after inhaling albuterol while on prednisone,  evaluated in the hospital at that time with arrhythmia lasting for hours before converting back to normal sinus rhythm,  History of Obstructive sleep apnea, on CPAP Who presents for follow-up of her atrial fibrillation  Cramps on pravastatin No significant cramping on Crestor  Breathing has been stable, No recent exacerbations She continues to have paroxysmal episodes of tachycardia concerning for atrial fibrillation Taking Rythmol, not taking metoprolol on a regular basis, felt to cause some weight gain Overall happy with the way she feels   no regular exercise program Previously with elevated Hemoglobin A1c  trending upwards, 7.5.  Eating the wrong foods Blood pressure relatively well-controlled  Denies any nonsustained VT symptoms, no palpitations or fluttering  EKG on today's visit shows normal sinus rhythm with rate 70 bpm, right bundle branch block  Other past medical history reviewed CT scan done in 2016 again reviewed with him showing mild coronary artery disease calcification   atrial fibrillation  01/16/2015 lasting at least 5 hours Possibly also 2 other very short episodes mention on her last clinic visit  metoprolol held for fatigue  multaq held for hair loss  started Rythmol 225 mg twice a day, SR dose, and on this regiment has felt better   She moved to the area from [place] when her husband changed jobs. She likes the area and has not had any significant problems since her arrival. In [place], she developed a severe upper respiratory infection, failed outpatient ABX. At home she was using Solu-Medrol, albuterol. Shortly after using the albuterol, she developed tachycardia, lightheadedness and went to the emergency room, found to be in atrial fibrillation. She is given IV medication though she does not know the details. Converted back to normal sinus rhythm  Was previously on anticoagulation, pradaxa, later changed to high-dose aspirin. She has been maintained on multaq, previously on metoprolol which she liked better but was changed February 2016.  Echocardiogram February 2016 was essentially normal, ejection fraction 60-65% Cardiac CTA with mild three-vessel CAD, ectatic ascending aorta measuring 3.72 cm  Total cholesterol 154, LDL 83  PFTs with mild obstructive lung disease  previous EKG on showing normal sinus rhythm with rate 75 bpm, no significant ST or T-wave changes   PMH:   has a past medical history of Atrial fibrillation ([place]); Carotid artery occlusion; Diabetes mellitus without complication (HCC); GERD (gastroesophageal reflux disease); History of colonic polyps; Hyperlipidemia; Hypertension; Lung infection; Mitral valve prolapse; Obstructive sleep apnea; Sleep apnea; Thyroid disease; and Ventricular tachycardia (paroxysmal) ([place]).  PSH:    Past Surgical History:  Procedure Laterality Date  .  ABDOMINAL HYSTERECTOMY  2002   ovaries remain  . CLAVICLE SURGERY  2007   right clavicle plate pins  . KNEE ARTHROSCOPY  2012    Current Outpatient Prescriptions   Medication Sig Dispense Refill  . [name] 50 MG tablet TAKE 1 TABLET BY MOUTH AT BEDTIME 90 tablet 3  . aspirin EC 325 MG tablet Take 1 tablet (325 mg total) by mouth daily. 30 tablet 0  . b complex vitamins capsule Take 1 capsule by mouth 2 (two) times daily.    Marland Kitchen co-enzyme Q-10 50 MG capsule Take 50 mg by mouth daily.    . cyclobenzaprine (FLEXERIL) 5 MG tablet Take 1 tablet (5 mg total) by mouth 2 (two) times daily as needed for muscle spasms. 15 tablet 0  . ezetimibe (ZETIA) 10 MG tablet Take 10 mg by mouth daily.    Marland Kitchen glipiZIDE (GLUCOTROL XL) 2.5 MG 24 hr tablet TAKE 1 TABLET BY MOUTH DAILY WITH BREAKFAST 90 tablet 1  . glucose blood (ACCU-CHEK AVIVA) test strip Use to check blood sugar 1-2 times a day 200 each 3  . levothyroxine (SYNTHROID, LEVOTHROID) 88 MCG tablet Take 1 tablet (88 mcg total) by mouth daily before breakfast. 90 tablet 2  . Magnesium 500 MG CAPS Take 1 capsule by mouth 2 (two) times daily.    . Multiple Vitamins-Minerals (MULTIVITAMIN ADULT PO) Take 1 capsule by mouth 2 (two) times daily.     . Omega-3 Fatty Acids (FISH OIL) 1200 MG CAPS Take 1 capsule by mouth daily after breakfast.    . omeprazole (PRILOSEC) 20 MG capsule Take 1 capsule (20 mg total) by mouth 2 (two) times daily before a meal. 180 capsule 3  . omeprazole (PRILOSEC) 20 MG capsule Take 20 mg by mouth daily.    . propafenone (RYTHMOL SR) 225 MG 12 hr capsule TAKE 1 CAPSULE(225 MG) BY MOUTH TWICE DAILY 180 capsule 0  . quinapril (ACCUPRIL) 20 MG tablet Take 1 tablet (20 mg total) by mouth at bedtime. 90 tablet 3  . rosuvastatin (CRESTOR) 5 MG tablet Take 1 tablet (5 mg total) by mouth daily. 90 tablet 3  . sitaGLIPtin-metformin (JANUMET) 50-1000 MG tablet Take 1 tablet by mouth 2 (two) times daily with a meal. 180 tablet 3  . venlafaxine XR (EFFEXOR-XR) 37.5 MG 24 hr capsule TAKE ONE CAPSULE BY MOUTH DAILY WITH BREAKFAST 90 capsule 1  . verapamil (CALAN-SR) 120 MG CR tablet Take 1 tablet  (120 mg total) by mouth at bedtime. (Patient taking differently: Take 120 mg by mouth every morning. ) 90 tablet 3   No current facility-administered medications for this visit.      Allergies:   Patient has no known allergies.   Social History:  The patient  reports that she quit smoking about 11 years ago. Her smoking use included Cigarettes. She has a 20.00 pack-year smoking history. She has never used smokeless tobacco. She reports that she does not drink alcohol or use drugs.   Family History:   family history includes Arthritis in her father; Breast cancer in her maternal grandmother; Depression in her mother; Diabetes in her brother, maternal grandfather, and sister; Heart attack in her mother; Heart disease in her father; Hypertension in her father; Sudden death (age of onset: 60) in her mother.    Review of Systems: Review of Systems  Constitutional: Negative.   Respiratory: Negative.   Cardiovascular: Positive for palpitations.  Gastrointestinal: Negative.   Musculoskeletal: Negative.   Neurological: Negative.  Psychiatric/Behavioral: Negative.   All other systems reviewed and are negative.    PHYSICAL EXAM: VS:  BP 130/90 (BP Location: Left Arm, Patient Position: Sitting, Cuff Size: Normal)   Pulse 70   Ht 5\' 6"  (1.676 m)   Wt 172 lb (78 kg)   BMI 27.76 kg/m  , BMI Body mass index is 27.76 kg/m. GEN: Well nourished, well developed, in no acute distress HEENT: normal Neck: no JVD, carotid bruits, or masses Cardiac: RRR; no murmurs, rubs, or gallops,no edema  Respiratory:  clear to auscultation bilaterally, normal work of breathing GI: soft, nontender, nondistended, + BS MS: no deformity or atrophy Skin: warm and dry, no rash Neuro:  Strength and sensation are intact Psych: euthymic mood, full affect    Recent Labs: 12/03/2015: Hemoglobin 10.0; Platelets 376 12/04/2015: TSH 1.418 06/14/2016: ALT 23; BUN 18; Creatinine, Ser 0.87; Potassium 4.3; Sodium 138     Lipid Panel Lab Results  Component Value Date   CHOL 142 12/04/2015   HDL 50 12/04/2015   LDLCALC 74 12/04/2015   TRIG 92 12/04/2015      Wt Readings from Last 3 Encounters:  10/28/16 172 lb (78 kg)  06/21/16 171 lb 1.9 oz (77.6 kg)  05/30/16 176 lb (79.8 kg)       ASSESSMENT AND PLAN:   Paroxysmal atrial fibrillation (HCC) - Plan: EKG 12-Lead  tolerating Rythmol Having paroxysmal episodes,not often We have renewed her metoprolol to take as needed Long discussion concerning risk and benefit of anticoagulation, We will start eliquis 5 mg twice a day Routine CBC with primary care in follow-up  Essential hypertension -  Recommended weight loss, exercise program. No medication changes made Suggested she continue to monitor her blood pressure at home  NSVT (nonsustained ventricular tachycardia) (HCC) denies any symptoms concerning for nonsustained VT.  Hyperlipidemia Coronary calcifications seen on previous CT scan chest Recommended she stay on her Crestor, goal LDL less than 70  Type 2 diabetes mellitus with other circulatory complication, without long-term current use of insulin ([place]) We have encouraged continued exercise, careful diet management in an effort to lose weight.  Shortness of breath Etiology likely secondary to deconditioning Recommended regular exercise program Mild underlying lung disease, asthma  Disposition:   F/U  12 months   Total encounter time more than 25 minutes  Greater than 50% was spent in counseling and coordination of care with the patient     Orders Placed This Encounter  Procedures  . EKG 12-Lead     Signed, Esmond Plants, M.D., Ph.D. 10/28/2016  [place], [place]

## 2016-10-28 ENCOUNTER — Ambulatory Visit (INDEPENDENT_AMBULATORY_CARE_PROVIDER_SITE_OTHER): Payer: Managed Care, Other (non HMO) | Admitting: Cardiovascular Disease

## 2016-10-28 ENCOUNTER — Encounter: Payer: Self-pay | Admitting: Cardiovascular Disease

## 2016-10-28 VITALS — BP 130/90 | HR 70 | Ht 66.0 in | Wt 172.0 lb

## 2016-10-28 DIAGNOSIS — I480 Paroxysmal atrial fibrillation: Secondary | ICD-10-CM | POA: Diagnosis not present

## 2016-10-28 DIAGNOSIS — R0602 Shortness of breath: Secondary | ICD-10-CM

## 2016-10-28 DIAGNOSIS — E782 Mixed hyperlipidemia: Secondary | ICD-10-CM | POA: Diagnosis not present

## 2016-10-28 DIAGNOSIS — E1159 Type 2 diabetes mellitus with other circulatory complications: Secondary | ICD-10-CM | POA: Diagnosis not present

## 2016-10-28 DIAGNOSIS — I10 Essential (primary) hypertension: Secondary | ICD-10-CM | POA: Diagnosis not present

## 2016-10-28 MED ORDER — ROSUVASTATIN CALCIUM 5 MG PO TABS: 5.0000 mg | ORAL_TABLET | Freq: Every day | ORAL | 3 refills | Status: DC

## 2016-10-28 MED ORDER — METOPROLOL TARTRATE 25 MG PO TABS: 25.0000 mg | ORAL_TABLET | Freq: Two times a day (BID) | ORAL | 3 refills | Status: DC

## 2016-10-28 MED ORDER — APIXABAN 5 MG PO TABS: 5.0000 mg | ORAL_TABLET | Freq: Two times a day (BID) | ORAL | 3 refills | Status: DC

## 2016-10-28 NOTE — Patient Instructions (Addendum)
Medication Instructions:   Stop aspirin Consider stopping fish oil Start eliquis 5 mg twice a day Metoprolol as needed for atrial fib  Medication Samples have been provided to the patient.  Drug name: Eliquis       Strength: 5 mg        Qty: 4 boxes  LOT: [id number] Exp.Date: 12/20  Dosing instructions: 1 tablet twice a day  Labwork:  No new labs needed  Testing/Procedures:  No further testing at this time   Follow-Up: It was a pleasure seeing you in the office today. Please call us if you have new issues that need to be addressed before your next appt.  [phone]  Your physician wants you to follow-up in: 12 months.  You will receive a reminder letter in the mail two months in advance. If you don't receive a letter, please call our office to schedule the follow-up appointment.  If you need a refill on your cardiac medications before your next appointment, please call your pharmacy.

## 2016-11-09 ENCOUNTER — Encounter: Payer: Self-pay | Admitting: Primary Care

## 2016-11-09 ENCOUNTER — Ambulatory Visit (INDEPENDENT_AMBULATORY_CARE_PROVIDER_SITE_OTHER): Payer: Managed Care, Other (non HMO) | Admitting: Primary Care

## 2016-11-09 VITALS — BP 132/86 | HR 87 | Temp 98.3°F | Ht 66.0 in | Wt 171.1 lb

## 2016-11-09 DIAGNOSIS — I480 Paroxysmal atrial fibrillation: Secondary | ICD-10-CM | POA: Diagnosis not present

## 2016-11-09 DIAGNOSIS — E119 Type 2 diabetes mellitus without complications: Secondary | ICD-10-CM

## 2016-11-09 DIAGNOSIS — R5383 Other fatigue: Secondary | ICD-10-CM | POA: Diagnosis not present

## 2016-11-09 DIAGNOSIS — I472 Ventricular tachycardia: Secondary | ICD-10-CM | POA: Diagnosis not present

## 2016-11-09 DIAGNOSIS — E039 Hypothyroidism, unspecified: Secondary | ICD-10-CM

## 2016-11-09 DIAGNOSIS — I25119 Atherosclerotic heart disease of native coronary artery with unspecified angina pectoris: Secondary | ICD-10-CM | POA: Diagnosis not present

## 2016-11-09 DIAGNOSIS — M2550 Pain in unspecified joint: Secondary | ICD-10-CM | POA: Diagnosis not present

## 2016-11-09 DIAGNOSIS — I4729 Other ventricular tachycardia: Secondary | ICD-10-CM

## 2016-11-09 NOTE — Assessment & Plan Note (Signed)
Check TSH today

## 2016-11-09 NOTE — Addendum Note (Signed)
Addended by: Ellamae Sia on: 11/09/2016 04:46 PM   Modules accepted: Orders

## 2016-11-09 NOTE — Assessment & Plan Note (Signed)
No recent symptoms, using metoprolol when necessary as instructed by cardiology. No recent use of metoprolol.

## 2016-11-09 NOTE — Patient Instructions (Signed)
Complete lab work prior to leaving today. I will notify you of your results once received.   Try tylenol arthritis and take as directed.  Start exercising. Regular exercise can help to reduce fatigue. You should be getting 150 minutes of moderate intensity exercise weekly.  It was a pleasure to see you today!

## 2016-11-09 NOTE — Assessment & Plan Note (Signed)
Generalized to most joints of the body. Also with stiffness. Symptoms likely to be osteoarthritic flare. Will check other labs including rheumatoid factor, CRP, CBC. Recommended she regularly exercise. Use Tylenol arthritis as needed.

## 2016-11-09 NOTE — Progress Notes (Signed)
Subjective:    Patient ID: Debra Perez, female    DOB: 1955/07/13, 61 y.o.   MRN: [id number]  HPI  Debra Perez is a 61 year old female with a history of hypertension, type 2 diabetes, hypothyroidism who presents today with a chief complaint of fatigue. She also reports body aches and stiffness. Her aches are located to her neck, shoulders, hips, lower back, lower extremities, and fingers. She's taken Advil with temporary improvement. She's been active and staying busy at home.   Her symptoms began about four weeks ago which lasted for three weeks. Now she's feeling improved. She was placed on Eliquis on 10/28/16, and switched to Crestor about 6 weeks ago due to myalgias.   She denies tick bites, nausea/vomiting, abdominal pain, chest pain, dizziness, depression. She is due for repeat labs including A1c next month.  Review of Systems  Constitutional: Positive for fatigue. Negative for chills and fever.  HENT: Negative for congestion.   Eyes: Negative for visual disturbance.  Respiratory: Negative for cough and shortness of breath.   Cardiovascular: Negative for chest pain.  Gastrointestinal: Negative for nausea and vomiting.  Neurological: Negative for dizziness, numbness and headaches.       Past Medical History:  Diagnosis Date  . Atrial fibrillation ([place])   . 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 Diabetes mellitus without complication ([place])   . GERD (gastroesophageal reflux disease)   . History of colonic polyps    benign  . Hyperlipidemia   . Hypertension   . Lung infection   . Mitral valve prolapse   . Obstructive sleep apnea    on CPAP  . Sleep apnea   . Thyroid disease   . Ventricular tachycardia (paroxysmal) [facility])      Social History   Social History  . Marital status: Married    Spouse name: N/A  . Number of children: N/A  . Years of education: N/A   Occupational History  . Not on file.   Social History Main Topics  . Smoking status:  Former Smoker    Packs/day: 1.00    Years: 20.00    Types: Cigarettes    Quit date: 03/07/2005  . Smokeless tobacco: Never Used  . Alcohol use No  . Drug use: No  . Sexual activity: Not on file   Other Topics Concern  . Not on file   Social History Narrative   Married.   1 daughter.   Moved here from [place] due to husbands occupation.   She is a Corporate treasurer.   Enjoys sewing, gardening.    Past Surgical History:  Procedure Laterality Date  . ABDOMINAL HYSTERECTOMY  2002   ovaries remain  . CLAVICLE SURGERY  2007   right clavicle plate pins  . KNEE ARTHROSCOPY  2012    Family History  Problem Relation Age of Onset  . Depression Mother   . Sudden death Mother 59  . Heart attack Mother   . Arthritis Father   . Heart disease Father   . Hypertension Father   . Diabetes Sister   . Diabetes Brother   . Diabetes Maternal Grandfather   . Breast cancer Maternal Grandmother     No Known Allergies  Current Outpatient Prescriptions on File Prior to Visit  Medication Sig Dispense Refill  . [name] 50 MG tablet TAKE 1 TABLET BY MOUTH AT BEDTIME 90 tablet 3  . apixaban (ELIQUIS) 5 MG TABS tablet Take 1 tablet (5 mg  total) by mouth 2 (two) times daily. 180 tablet 3  . b complex vitamins capsule Take 1 capsule by mouth 2 (two) times daily.    Marland Kitchen co-enzyme Q-10 50 MG capsule Take 50 mg by mouth daily.    . cyclobenzaprine (FLEXERIL) 5 MG tablet Take 1 tablet (5 mg total) by mouth 2 (two) times daily as needed for muscle spasms. 15 tablet 0  . ezetimibe (ZETIA) 10 MG tablet Take 10 mg by mouth daily.    Marland Kitchen glucose blood (ACCU-CHEK AVIVA) test strip Use to check blood sugar 1-2 times a day 200 each 3  . levothyroxine (SYNTHROID, LEVOTHROID) 88 MCG tablet Take 1 tablet (88 mcg total) by mouth daily before breakfast. 90 tablet 2  . Magnesium 500 MG CAPS Take 1 capsule by mouth 2 (two) times daily.    . metoprolol tartrate (LOPRESSOR) 25 MG tablet Take 1 tablet (25 mg total) by  mouth 2 (two) times daily. (Patient taking differently: Take 25 mg by mouth as needed. ) 180 tablet 3  . Multiple Vitamins-Minerals (MULTIVITAMIN ADULT PO) Take 1 capsule by mouth 2 (two) times daily.     Marland Kitchen omeprazole (PRILOSEC) 20 MG capsule Take 20 mg by mouth daily.    . propafenone (RYTHMOL SR) 225 MG 12 hr capsule TAKE 1 CAPSULE(225 MG) BY MOUTH TWICE DAILY 180 capsule 0  . quinapril (ACCUPRIL) 20 MG tablet Take 1 tablet (20 mg total) by mouth at bedtime. 90 tablet 3  . rosuvastatin (CRESTOR) 5 MG tablet Take 1 tablet (5 mg total) by mouth daily. 90 tablet 3  . sitaGLIPtin-metformin (JANUMET) 50-1000 MG tablet Take 1 tablet by mouth 2 (two) times daily with a meal. 180 tablet 3  . venlafaxine XR (EFFEXOR-XR) 37.5 MG 24 hr capsule TAKE ONE CAPSULE BY MOUTH DAILY WITH BREAKFAST 90 capsule 1  . verapamil (CALAN-SR) 120 MG CR tablet Take 1 tablet (120 mg total) by mouth at bedtime. (Patient taking differently: Take 120 mg by mouth every morning. ) 90 tablet 3   No current facility-administered medications on file prior to visit.     BP 132/86   Pulse 87   Temp 98.3 F (36.8 C) (Oral)   Ht 5\' 6"  (1.676 m)   Wt 171 lb 1.9 oz (77.6 kg)   SpO2 95%   BMI 27.62 kg/m    Objective:   Physical Exam  Constitutional: She is oriented to person, place, and time. She appears well-nourished.  HENT:  Mouth/Throat: Oropharynx is clear and moist.  Neck: Neck supple. No thyromegaly present.  Cardiovascular: Normal rate and regular rhythm.   Pulmonary/Chest: Effort normal and breath sounds normal.  Musculoskeletal: Normal range of motion.  Neurological: She is alert and oriented to person, place, and time.  Skin: Skin is warm and dry.  Psychiatric: She has a normal mood and affect.          Assessment & Plan:

## 2016-11-09 NOTE — Assessment & Plan Note (Signed)
Could be from lack of regular exercise. Low suspicion for depression. No recent use of metoprolol. Check CBC, TSH, A1c, B12 today. Recent cardiology evaluation stable.

## 2016-11-09 NOTE — Addendum Note (Signed)
Addended by: Mady Haagensen on: 11/09/2016 05:13 PM   Modules accepted: Orders

## 2016-11-09 NOTE — Assessment & Plan Note (Addendum)
Will check A1c today, if elevated then could be contributing to symptoms of fatigue.

## 2016-11-09 NOTE — Assessment & Plan Note (Signed)
Initiated on Eliquis two weeks ago per cardiology.

## 2016-11-09 NOTE — Assessment & Plan Note (Signed)
Recent follow-up appointment with cardiology. Continue statin, blood pressure control, blood sugar control. A1c pending today.

## 2016-11-10 ENCOUNTER — Encounter: Payer: Self-pay | Admitting: Primary Care

## 2016-11-10 LAB — CBC
HEMATOCRIT: 33.3 % — AB (ref 36.0–46.0)
Hemoglobin: 10.3 g/dL — ABNORMAL LOW (ref 12.0–15.0)
MCHC: 31 g/dL (ref 30.0–36.0)
MCV: 74.8 fl — AB (ref 78.0–100.0)
Platelets: 409 10*3/uL — ABNORMAL HIGH (ref 150.0–400.0)
RBC: 4.45 Mil/uL (ref 3.87–5.11)
RDW: 20.8 % — ABNORMAL HIGH (ref 11.5–15.5)
WBC: 9.2 10*3/uL (ref 4.0–10.5)

## 2016-11-10 LAB — COMPREHENSIVE METABOLIC PANEL
ALBUMIN: 4.1 g/dL (ref 3.5–5.2)
ALT: 22 U/L (ref 0–35)
AST: 21 U/L (ref 0–37)
Alkaline Phosphatase: 47 U/L (ref 39–117)
BILIRUBIN TOTAL: 0.3 mg/dL (ref 0.2–1.2)
BUN: 8 mg/dL (ref 6–23)
CALCIUM: 9.3 mg/dL (ref 8.4–10.5)
CHLORIDE: 103 meq/L (ref 96–112)
CO2: 27 mEq/L (ref 19–32)
Creatinine, Ser: 0.77 mg/dL (ref 0.40–1.20)
GFR: 81.63 mL/min (ref >60.00)
Glucose, Bld: 110 mg/dL — ABNORMAL HIGH (ref 70–99)
Potassium: 4.2 mEq/L (ref 3.5–5.1)
SODIUM: 139 meq/L (ref 135–145)
TOTAL PROTEIN: 7 g/dL (ref 6.0–8.3)

## 2016-11-10 LAB — VITAMIN D 25 HYDROXY (VIT D DEFICIENCY, FRACTURES): VITD: 46.53 ng/mL (ref 30.00–100.00)

## 2016-11-10 LAB — HEMOGLOBIN A1C: Hgb A1c MFr Bld: 7.7 % — ABNORMAL HIGH (ref 4.6–6.5)

## 2016-11-10 LAB — TSH: TSH: 0.47 u[IU]/mL (ref 0.35–4.50)

## 2016-11-10 LAB — RHEUMATOID FACTOR

## 2016-11-10 LAB — SEDIMENTATION RATE: Sed Rate: 18 mm/hr (ref 0–30)

## 2016-11-10 LAB — C-REACTIVE PROTEIN: CRP: 0.2 mg/dL — ABNORMAL LOW (ref 0.5–20.0)

## 2016-11-10 LAB — VITAMIN B12: VITAMIN B 12: 583 pg/mL (ref 211–911)

## 2016-11-18 ENCOUNTER — Other Ambulatory Visit: Payer: Self-pay | Admitting: Primary Care

## 2016-12-01 ENCOUNTER — Telehealth: Payer: Self-pay | Admitting: Cardiovascular Disease

## 2016-12-01 ENCOUNTER — Other Ambulatory Visit: Payer: Self-pay | Admitting: Primary Care

## 2016-12-01 DIAGNOSIS — E119 Type 2 diabetes mellitus without complications: Secondary | ICD-10-CM

## 2016-12-01 NOTE — Telephone Encounter (Signed)
Received request for cardiac clearance from Dr. Doran Durand at [facility] for Right 1st MT scarf osteotomy and modified McBride bunionectomy, right 2nd MT Weil osteotomy and possible Akin osteotomy; hammertoe correction versus flexor to extensor transfer. Patient is also on Eliquis and spoke with patient and she denies any changes since her last visit. Clearance needs to be routed to attn: Fabio Asa at [phone]

## 2016-12-01 NOTE — Telephone Encounter (Signed)
Noted routed over to number provided.

## 2016-12-01 NOTE — Telephone Encounter (Signed)
Acceptable risk for surgery Would stop eliquis 2 days prior to surgery Consider restarting 24 hours after the procedure if okay with surgeon

## 2016-12-11 ENCOUNTER — Encounter: Payer: Self-pay | Admitting: Primary Care

## 2016-12-12 NOTE — Telephone Encounter (Signed)
If appt need to be cancel, please have patient call office. Thanks

## 2016-12-13 ENCOUNTER — Ambulatory Visit (INDEPENDENT_AMBULATORY_CARE_PROVIDER_SITE_OTHER): Payer: Managed Care, Other (non HMO)

## 2016-12-13 ENCOUNTER — Other Ambulatory Visit: Payer: Self-pay | Admitting: Primary Care

## 2016-12-13 DIAGNOSIS — Z23 Encounter for immunization: Secondary | ICD-10-CM | POA: Diagnosis not present

## 2016-12-22 ENCOUNTER — Other Ambulatory Visit: Payer: Managed Care, Other (non HMO)

## 2016-12-27 ENCOUNTER — Encounter: Payer: Self-pay | Admitting: Primary Care

## 2016-12-27 ENCOUNTER — Ambulatory Visit (INDEPENDENT_AMBULATORY_CARE_PROVIDER_SITE_OTHER): Payer: Managed Care, Other (non HMO) | Admitting: Primary Care

## 2016-12-27 DIAGNOSIS — E1159 Type 2 diabetes mellitus with other circulatory complications: Secondary | ICD-10-CM

## 2016-12-27 NOTE — Assessment & Plan Note (Signed)
Recent A1C of 7.7 which is a slight increase from prior. Suspect that her A1C will reduce once she's under less stress with her elderly aunt. Will recheck in 2 months.  Will have her work on [employer] and start exercising. Follow up in 6 months.

## 2016-12-27 NOTE — Patient Instructions (Addendum)
Continue Glipizide and Janumet 50-1000 mg.   Monitor your blood sugars and notify me if you start dropping below 90 on a regular basis.  Limit breads and starchy food. Increase vegetables, fruit, whole grains, lean protein.  Ensure you are consuming 64 ounces of water daily.  Start exercising. You should be getting 150 minutes of moderate intensity exercise weekly.  Schedule a lab only appointment in early December to recheck your A1C.  Schedule your physical in 6 months.  It was a pleasure to see you today!

## 2016-12-27 NOTE — Progress Notes (Signed)
Subjective:    Patient ID: Debra Perez, female    DOB: 26-Mar-1955, 61 y.o.   MRN: [id number]  HPI  Debra Perez is a 61 year old female who presents today for follow up of diabetes. 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 Her aunt has been accepted into a SNF and will be transitioning there in a few days.   Current medications include: Glipizide 5 mg, Janumet 50-1000 mg.  She is checking her blood glucose one time daily and is getting readings of: Fasting AM: 120-130's Afternoon 2 hours after lunch: 150-160's.   Last A1C: 7.7 in September 2018 Last Eye Exam: Completed in April 2018 Last Foot Exam: Completed in April 2018 Pneumonia Vaccination: Completed in 2014, due in 2019 ACE/ARB: ACE Statin: atorvastatin  Diet currently consists of:  Breakfast: 1/2 bagel, sometimes eggs Lunch: Humus and pita bread Dinner: Meat and vegetable Snacks: Bread, candy bar Desserts: 5 days weekly Beverages: Coffee, water, un-sweet tea  Exercise: She is not currently exercising.        Review of Systems  Eyes: Negative for visual disturbance.  Respiratory: Negative for shortness of breath.   Cardiovascular: Negative for chest pain.  Neurological: Negative for dizziness, numbness and headaches.  Psychiatric/Behavioral:       Increased stress over the past several months.        Past Medical History:  Diagnosis Date  . Atrial fibrillation ([place])   . 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 Diabetes mellitus without complication ([facility]ra [facility])   . GERD (gastroesophageal reflux disease)   . History of colonic polyps    benign  . Hyperlipidemia   . Hypertension   . Lung infection   . Mitral valve prolapse   . Obstructive sleep apnea    on CPAP  . Sleep apnea   . Thyroid disease   . Ventricular tachycardia (paroxysmal) [facility])      Social History   Social History  . Marital status: Married    Spouse name: N/A    . Number of children: N/A  . Years of education: N/A   Occupational History  . Not on file.   Social History Main Topics  . Smoking status: Former Smoker    Packs/day: 1.00    Years: 20.00    Types: Cigarettes    Quit date: 03/07/2005  . Smokeless tobacco: Never Used  . Alcohol use No  . Drug use: No  . Sexual activity: Not on file   Other Topics Concern  . Not on file   Social History Narrative   Married.   1 daughter.   Moved here from [place] due to husbands occupation.   She is a Corporate treasurer.   Enjoys sewing, gardening.    Past Surgical History:  Procedure Laterality Date  . ABDOMINAL HYSTERECTOMY  2002   ovaries remain  . CLAVICLE SURGERY  2007   right clavicle plate pins  . KNEE ARTHROSCOPY  2012    Family History  Problem Relation Age of Onset  . Depression Mother   . Sudden death Mother 39  . Heart attack Mother   . Arthritis Father   . Heart disease Father   . Hypertension Father   . Diabetes Sister   . Diabetes Brother   . Diabetes Maternal Grandfather   . Breast cancer Maternal Grandmother     No Known Allergies  Current  Outpatient Prescriptions on File Prior to Visit  Medication Sig Dispense Refill  . [name] 50 MG tablet TAKE 1 TABLET BY MOUTH AT BEDTIME 90 tablet 3  . apixaban (ELIQUIS) 5 MG TABS tablet Take 1 tablet (5 mg total) by mouth 2 (two) times daily. 180 tablet 3  . b complex vitamins capsule Take 1 capsule by mouth 2 (two) times daily.    Marland Kitchen co-enzyme Q-10 50 MG capsule Take 50 mg by mouth daily.    . cyclobenzaprine (FLEXERIL) 5 MG tablet Take 1 tablet (5 mg total) by mouth 2 (two) times daily as needed for muscle spasms. 15 tablet 0  . ezetimibe (ZETIA) 10 MG tablet Take 10 mg by mouth daily.    Marland Kitchen glucose blood (ACCU-CHEK AVIVA) test strip Use to check blood sugar 1-2 times a day 200 each 3  . JANUMET 50-1000 MG tablet TAKE 1 TABLET BY MOUTH TWICE DAILY WITH A MEAL. 180 tablet 1  . levothyroxine (SYNTHROID, LEVOTHROID) 88  MCG tablet TAKE 1 TABLET BY MOUTH EVERY DAY BEFORE BREAKFAST 90 tablet 3  . Magnesium 500 MG CAPS Take 1 capsule by mouth 2 (two) times daily.    . metoprolol tartrate (LOPRESSOR) 25 MG tablet Take 1 tablet (25 mg total) by mouth 2 (two) times daily. (Patient taking differently: Take 25 mg by mouth as needed. ) 180 tablet 3  . Multiple Vitamins-Minerals (MULTIVITAMIN ADULT PO) Take 1 capsule by mouth 2 (two) times daily.     Marland Kitchen omeprazole (PRILOSEC) 20 MG capsule TAKE ONE CAPSULE BY MOUTH TWICE DAILY BEFORE A MEAL. 180 capsule 1  . propafenone (RYTHMOL SR) 225 MG 12 hr capsule TAKE 1 CAPSULE(225 MG) BY MOUTH TWICE DAILY 180 capsule 0  . quinapril (ACCUPRIL) 20 MG tablet Take 1 tablet (20 mg total) by mouth at bedtime. 90 tablet 3  . rosuvastatin (CRESTOR) 5 MG tablet Take 1 tablet (5 mg total) by mouth daily. 90 tablet 3  . venlafaxine XR (EFFEXOR-XR) 37.5 MG 24 hr capsule TAKE ONE CAPSULE BY MOUTH DAILY WITH BREAKFAST 90 capsule 1  . verapamil (CALAN-SR) 120 MG CR tablet Take 1 tablet (120 mg total) by mouth at bedtime. (Patient taking differently: Take 120 mg by mouth every morning. ) 90 tablet 3   No current facility-administered medications on file prior to visit.     BP 122/76   Pulse 81   Temp 98.1 F (36.7 C) (Oral)   Ht 5\' 6"  (1.676 m)   Wt 173 lb 1.9 oz (78.5 kg)   SpO2 98%   BMI 27.94 kg/m    Objective:   Physical Exam  Constitutional: She appears well-nourished.  Neck: Neck supple.  Cardiovascular: Normal rate and regular rhythm.   Pulmonary/Chest: Effort normal and breath sounds normal.  Skin: Skin is warm and dry.  Psychiatric: She has a normal mood and affect.          Assessment & Plan:

## 2016-12-28 ENCOUNTER — Encounter: Payer: Self-pay | Admitting: Primary Care

## 2017-01-01 ENCOUNTER — Encounter: Payer: Self-pay | Admitting: Primary Care

## 2017-02-09 ENCOUNTER — Other Ambulatory Visit: Payer: Self-pay | Admitting: Cardiovascular Disease

## 2017-02-09 ENCOUNTER — Other Ambulatory Visit: Payer: Self-pay | Admitting: Primary Care

## 2017-02-09 DIAGNOSIS — E1159 Type 2 diabetes mellitus with other circulatory complications: Secondary | ICD-10-CM

## 2017-02-10 NOTE — Telephone Encounter (Signed)
Please advise if ok to refill zetia.

## 2017-02-13 ENCOUNTER — Other Ambulatory Visit: Payer: Self-pay | Admitting: Cardiovascular Disease

## 2017-02-13 ENCOUNTER — Other Ambulatory Visit: Payer: Managed Care, Other (non HMO)

## 2017-02-23 ENCOUNTER — Other Ambulatory Visit: Payer: Self-pay | Admitting: Primary Care

## 2017-03-01 ENCOUNTER — Encounter: Payer: Self-pay | Admitting: Primary Care

## 2017-03-02 ENCOUNTER — Encounter: Payer: Self-pay | Admitting: Cardiovascular Disease

## 2017-03-03 MED ORDER — VERAPAMIL HCL ER 120 MG PO TBCR: 120.0000 mg | EXTENDED_RELEASE_TABLET | Freq: Every day | ORAL | 3 refills | Status: DC

## 2017-03-06 ENCOUNTER — Other Ambulatory Visit: Payer: Self-pay | Admitting: Cardiovascular Disease

## 2017-03-17 ENCOUNTER — Encounter: Payer: Self-pay | Admitting: Primary Care

## 2017-03-28 ENCOUNTER — Other Ambulatory Visit: Payer: Self-pay | Admitting: *Deleted

## 2017-03-28 MED ORDER — PROPAFENONE HCL ER 225 MG PO CP12: 225.0000 mg | ORAL_CAPSULE | Freq: Two times a day (BID) | ORAL | 3 refills | Status: DC

## 2017-04-06 ENCOUNTER — Other Ambulatory Visit (INDEPENDENT_AMBULATORY_CARE_PROVIDER_SITE_OTHER): Payer: Managed Care, Other (non HMO)

## 2017-04-06 DIAGNOSIS — E1159 Type 2 diabetes mellitus with other circulatory complications: Secondary | ICD-10-CM

## 2017-04-06 LAB — HEMOGLOBIN A1C: Hgb A1c MFr Bld: 7.5 % — ABNORMAL HIGH (ref 4.6–6.5)

## 2017-04-19 ENCOUNTER — Ambulatory Visit (INDEPENDENT_AMBULATORY_CARE_PROVIDER_SITE_OTHER): Payer: Managed Care, Other (non HMO) | Admitting: Primary Care

## 2017-04-19 ENCOUNTER — Encounter: Payer: Self-pay | Admitting: Primary Care

## 2017-04-19 VITALS — BP 134/82 | HR 78 | Temp 98.2°F | Ht 66.0 in | Wt 179.1 lb

## 2017-04-19 DIAGNOSIS — K219 Gastro-esophageal reflux disease without esophagitis: Secondary | ICD-10-CM

## 2017-04-19 DIAGNOSIS — M8580 Other specified disorders of bone density and structure, unspecified site: Secondary | ICD-10-CM | POA: Diagnosis not present

## 2017-04-19 DIAGNOSIS — E2839 Other primary ovarian failure: Secondary | ICD-10-CM

## 2017-04-19 LAB — BASIC METABOLIC PANEL
BUN: 12 mg/dL (ref 6–23)
CO2: 29 mEq/L (ref 19–32)
Calcium: 9 mg/dL (ref 8.4–10.5)
Chloride: 104 mEq/L (ref 96–112)
Creatinine, Ser: 0.74 mg/dL (ref 0.40–1.20)
GFR: 85.33 mL/min (ref >60.00)
GLUCOSE: 117 mg/dL — AB (ref 70–99)
POTASSIUM: 4.2 meq/L (ref 3.5–5.1)
SODIUM: 139 meq/L (ref 135–145)

## 2017-04-19 MED ORDER — RANITIDINE HCL 150 MG PO TABS: 150.0000 mg | ORAL_TABLET | Freq: Two times a day (BID) | ORAL | 0 refills | Status: DC

## 2017-04-19 NOTE — Progress Notes (Signed)
Subjective:    Patient ID: Debra Perez, female    DOB: 1955-09-12, 62 y.o.   MRN: [id number]  HPI  Debra Perez is a 62 year old female with a history of osteopenia who presents today to discuss bone density treatment.  She recently underwent a dental exam and the dentist noted that her jaw bones were very thin and could see through the bone.  She was encouraged to follow-up with her PCP for further evaluation.    Her last bone density showed osteopenia in 2016. She's not taking calcium or vitamin D as calcium causes GI upset. She's never taken bisphosphonate treatment in the past. She is managed on omeprazole 20 mg once daily which was reduced last visit from twice daily and has tolerated well.  She denies recent falls, fractures.  Review of Systems  Constitutional: Negative for unexpected weight change.  Musculoskeletal: Negative for arthralgias.  Skin: Negative for color change.  Neurological: Negative for weakness.       Past Medical History:  Diagnosis Date  . Atrial fibrillation ([place])   . 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 Diabetes mellitus without complication ([place])   . GERD (gastroesophageal reflux disease)   . History of colonic polyps    benign  . Hyperlipidemia   . Hypertension   . Lung infection   . Mitral valve prolapse   . Obstructive sleep apnea    on CPAP  . Sleep apnea   . Thyroid disease   . Ventricular tachycardia (paroxysmal) ([facility])      Social History   Socioeconomic History  . Marital status: Married    Spouse name: Not on file  . Number of children: Not on file  . Years of education: Not on file  . Highest education level: Not on file  Social Needs  . Financial resource strain: Not on file  . Food insecurity - worry: Not on file  . Food insecurity - inability: Not on file  . Transportation needs - medical: Not on file  . Transportation needs - non-medical: Not on file  Occupational History  . Not on file  Tobacco Use  .  Smoking status: Former Smoker    Packs/day: 1.00    Years: 20.00    Pack years: 20.00    Types: Cigarettes    Last attempt to quit: 03/07/2005    Years since quitting: 12.1  . Smokeless tobacco: Never Used  Substance and Sexual Activity  . Alcohol use: No    Alcohol/week: 0.0 oz  . Drug use: No  . Sexual activity: Not on file  Other Topics Concern  . Not on file  Social History Narrative   Married.   1 daughter.   Moved here from [place] due to husbands occupation.   She is a Corporate treasurer.   Enjoys sewing, gardening.    Past Surgical History:  Procedure Laterality Date  . ABDOMINAL HYSTERECTOMY  2002   ovaries remain  . CLAVICLE SURGERY  2007   right clavicle plate pins  . KNEE ARTHROSCOPY  2012    Family History  Problem Relation Age of Onset  . Depression Mother   . Sudden death Mother 60  . Heart attack Mother   . Arthritis Father   . Heart disease Father   . Hypertension Father   . Diabetes Sister   . Diabetes Brother   . Diabetes Maternal Grandfather   . Breast cancer Maternal Grandmother     No Known  Allergies  Current Outpatient Medications on File Prior to Visit  Medication Sig Dispense Refill  . [name] 50 MG tablet TAKE 1 TABLET BY MOUTH AT BEDTIME 90 tablet 1  . apixaban (ELIQUIS) 5 MG TABS tablet Take 1 tablet (5 mg total) by mouth 2 (two) times daily. 180 tablet 3  . b complex vitamins capsule Take 1 capsule by mouth 2 (two) times daily.    Marland Kitchen co-enzyme Q-10 50 MG capsule Take 50 mg by mouth daily.    . cyclobenzaprine (FLEXERIL) 5 MG tablet Take 1 tablet (5 mg total) by mouth 2 (two) times daily as needed for muscle spasms. 15 tablet 0  . ezetimibe (ZETIA) 10 MG tablet TAKE 1 TABLET BY MOUTH DAILY GENERIC EQUIVALENT FOR ZETIA 90 tablet 2  . glipiZIDE (GLUCOTROL) 5 MG tablet Take 5 mg by mouth daily before breakfast.    . glucose blood (ACCU-CHEK AVIVA PLUS) test strip Use to test blood sugar up to 2 times daily 300 each 3  . JANUMET 50-1000  MG tablet TAKE 1 TABLET BY MOUTH TWICE DAILY WITH A MEAL. 180 tablet 1  . levothyroxine (SYNTHROID, LEVOTHROID) 88 MCG tablet TAKE 1 TABLET BY MOUTH EVERY DAY BEFORE BREAKFAST 90 tablet 3  . Magnesium 500 MG CAPS Take 1 capsule by mouth 2 (two) times daily.    . metoprolol tartrate (LOPRESSOR) 25 MG tablet Take 1 tablet (25 mg total) by mouth 2 (two) times daily. (Patient taking differently: Take 25 mg by mouth as needed. ) 180 tablet 3  . Multiple Vitamins-Minerals (MULTIVITAMIN ADULT PO) Take 1 capsule by mouth 2 (two) times daily.     . propafenone (RYTHMOL SR) 225 MG 12 hr capsule Take 1 capsule (225 mg total) by mouth 2 (two) times daily. 180 capsule 3  . quinapril (ACCUPRIL) 20 MG tablet TAKE 1 TABLET BY MOUTH AT BEDTIME. GENERIC EQUIVALENT FOR ACCUPRIL. 90 tablet 3  . venlafaxine XR (EFFEXOR-XR) 37.5 MG 24 hr capsule TAKE ONE CAPSULE BY MOUTH DAILY WITH BREAKFAST 90 capsule 1  . verapamil (CALAN-SR) 120 MG CR tablet Take 1 tablet (120 mg total) by mouth at bedtime. 90 tablet 3  . rosuvastatin (CRESTOR) 5 MG tablet Take 1 tablet (5 mg total) by mouth daily. 90 tablet 3   No current facility-administered medications on file prior to visit.     BP 134/82   Pulse 78   Temp 98.2 F (36.8 C) (Oral)   Ht 5\' 6"  (1.676 m)   Wt 179 lb 1.9 oz (81.2 kg)   SpO2 98%   BMI 28.91 kg/m    Objective:   Physical Exam  Constitutional: She appears well-nourished.  Neck: Neck supple.  Cardiovascular: Normal rate and regular rhythm.  Pulmonary/Chest: Effort normal and breath sounds normal.  Skin: Skin is warm and dry.          Assessment & Plan:

## 2017-04-19 NOTE — Assessment & Plan Note (Signed)
Repeat bone density scan pending today. Calcium level pending today. Stop PPI, switch to H2 blocker. Consider treatment with Boniva versus Prolia based off of bone density scan.  Will await results.

## 2017-04-19 NOTE — Patient Instructions (Addendum)
Stop by the lab prior to leaving today. I will notify you of your results once received.   Stop by the front desk and speak with either Rosaria Ferries or Shirlean Mylar regarding your bone density scan.  Stop omeprazole 20 mg. Start ranitidine (Zantac) 150 mg once to twice daily for acid reflux.  I'll be in touch soon!  It was a pleasure to see you today!

## 2017-05-11 ENCOUNTER — Ambulatory Visit
Admission: RE | Admit: 2017-05-11 | Discharge: 2017-05-11 | Disposition: A | Discharge status: Home / Self Care | Payer: Managed Care, Other (non HMO) | Source: Ambulatory Visit | Attending: Primary Care | Admitting: Primary Care

## 2017-05-11 ENCOUNTER — Encounter: Payer: Self-pay | Admitting: Primary Care

## 2017-05-11 ENCOUNTER — Other Ambulatory Visit: Payer: Managed Care, Other (non HMO)

## 2017-05-11 DIAGNOSIS — M8580 Other specified disorders of bone density and structure, unspecified site: Secondary | ICD-10-CM

## 2017-05-11 DIAGNOSIS — E2839 Other primary ovarian failure: Secondary | ICD-10-CM

## 2017-05-11 MED ORDER — IBANDRONATE SODIUM 150 MG PO TABS
ORAL_TABLET | ORAL | 3 refills | Status: DC
Start: 2017-05-11 — End: 2017-06-19

## 2017-05-18 ENCOUNTER — Other Ambulatory Visit: Payer: Self-pay | Admitting: Primary Care

## 2017-05-18 DIAGNOSIS — E119 Type 2 diabetes mellitus without complications: Secondary | ICD-10-CM

## 2017-05-21 ENCOUNTER — Encounter: Payer: Self-pay | Admitting: Primary Care

## 2017-05-21 DIAGNOSIS — M5441 Lumbago with sciatica, right side: Secondary | ICD-10-CM

## 2017-05-22 MED ORDER — CYCLOBENZAPRINE HCL 5 MG PO TABS: 5.0000 mg | ORAL_TABLET | Freq: Two times a day (BID) | ORAL | 0 refills | Status: DC | PRN

## 2017-05-29 ENCOUNTER — Other Ambulatory Visit: Payer: Self-pay | Admitting: Primary Care

## 2017-06-02 ENCOUNTER — Other Ambulatory Visit: Payer: Self-pay | Admitting: Primary Care

## 2017-06-02 ENCOUNTER — Other Ambulatory Visit: Payer: Self-pay | Admitting: Cardiovascular Disease

## 2017-06-02 ENCOUNTER — Encounter: Payer: Self-pay | Admitting: Primary Care

## 2017-06-02 DIAGNOSIS — R232 Flushing: Secondary | ICD-10-CM

## 2017-06-02 DIAGNOSIS — K219 Gastro-esophageal reflux disease without esophagitis: Secondary | ICD-10-CM

## 2017-06-02 MED ORDER — ROSUVASTATIN CALCIUM 5 MG PO TABS: 5.0000 mg | ORAL_TABLET | Freq: Every day | ORAL | 2 refills | Status: DC

## 2017-06-02 MED ORDER — RANITIDINE HCL 150 MG PO TABS: 150.0000 mg | ORAL_TABLET | Freq: Two times a day (BID) | ORAL | 0 refills | Status: DC

## 2017-06-05 LAB — HM DIABETES EYE EXAM

## 2017-06-09 ENCOUNTER — Encounter: Payer: Self-pay | Admitting: Family Medicine

## 2017-06-09 ENCOUNTER — Ambulatory Visit (INDEPENDENT_AMBULATORY_CARE_PROVIDER_SITE_OTHER): Payer: Managed Care, Other (non HMO) | Admitting: Family Medicine

## 2017-06-09 VITALS — BP 126/64 | HR 86 | Temp 99.2°F | Ht 66.0 in | Wt 177.0 lb

## 2017-06-09 DIAGNOSIS — B9789 Other viral agents as the cause of diseases classified elsewhere: Secondary | ICD-10-CM

## 2017-06-09 DIAGNOSIS — J069 Acute upper respiratory infection, unspecified: Secondary | ICD-10-CM | POA: Diagnosis not present

## 2017-06-09 MED ORDER — PREDNISONE 10 MG PO TABS: ORAL_TABLET | ORAL | 0 refills | Status: DC

## 2017-06-09 MED ORDER — IPRATROPIUM BROMIDE HFA 17 MCG/ACT IN AERS: 2.0000 | INHALATION_SPRAY | RESPIRATORY_TRACT | 1 refills | Status: DC | PRN

## 2017-06-09 NOTE — Patient Instructions (Signed)
Drink lots of fluids  Try the atrovent inhaler if needed  If wheeze/tight chest increase-then fill the prednisone (otherwise hold on to it)   Delsym for cough is fine   Watch for fever / worse cough/ shortness of breath or facial pain   Update if not starting to improve in a week or if worsening

## 2017-06-09 NOTE — Progress Notes (Signed)
Subjective:    Patient ID: Debra Perez, female    DOB: Feb 14, 1956, 62 y.o.   MRN: [id number]  HPI  Here for cough and congestion  Started with scratchy throat on Monday  Worse on Tuesday (had to have dental work)  Wed/thurs - felt awful   Just a little better today  Is prone to chest congestion and pneumonia -worried about that   Took 2 advil - knew she shouldn't  Tylenol does help much   Cough is from pnd - just slt productive  A little wheezing last night   (cannot have albuterol)- can use ipatrop  Nose is mostly runny  Mucous is a little green No facial pain  Little headache - top of her head   L ear feels full  R ear ok  No pain   Throat is fine now   otc chlorcedin hbp Took a zyrtec   62 yo pt of NP clark former smoker with hx of CAD  Temp: 99.2 F (37.3 C)    Patient Active Problem List   Diagnosis Date Noted  . Osteopenia 04/19/2017  . Arthralgia 11/09/2016  . Fatigue 11/09/2016  . Pleuritic chest pain   . Bilateral thoracic back pain   . Coronary artery disease involving native coronary artery of native heart with angina pectoris ([place])   . Atypical chest pain 12/03/2015  . SOB (shortness of breath) 08/07/2015  . GERD (gastroesophageal reflux disease) 11/06/2014  . NSVT (nonsustained ventricular tachycardia) ([place]) 11/06/2014  . Preventative health care 11/05/2014  . Paroxysmal atrial fibrillation ([place]) 09/17/2014  . Hot flashes 09/17/2014  . Type 2 diabetes mellitus ([place]) 08/21/2014  . Essential hypertension 08/21/2014  . Mixed hyperlipidemia 08/21/2014  . Hypothyroidism 08/21/2014   Past Medical History:  Diagnosis Date  . Atrial fibrillation ([place])   . 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 Diabetes mellitus without complication ([place])   . GERD (gastroesophageal reflux disease)   . History of colonic polyps    benign  . Hyperlipidemia   . Hypertension   . Lung infection   . Mitral valve prolapse   . Obstructive sleep apnea      on CPAP  . Sleep apnea   . Thyroid disease   . Ventricular tachycardia (paroxysmal) [facility])    Past Surgical History:  Procedure Laterality Date  . ABDOMINAL HYSTERECTOMY  2002   ovaries remain  . CLAVICLE SURGERY  2007   right clavicle plate pins  . KNEE ARTHROSCOPY  2012   Social History   Tobacco Use  . Smoking status: Former Smoker    Packs/day: 1.00    Years: 20.00    Pack years: 20.00    Types: Cigarettes    Last attempt to quit: 03/07/2005    Years since quitting: 12.2  . Smokeless tobacco: Never Used  Substance Use Topics  . Alcohol use: No    Alcohol/week: 0.0 oz  . Drug use: No   Family History  Problem Relation Age of Onset  . Depression Mother   . Sudden death Mother 52  . Heart attack Mother   . Arthritis Father   . Heart disease Father   . Hypertension Father   . Diabetes Sister   . Diabetes Brother   . Diabetes Maternal Grandfather   . Breast cancer Maternal Grandmother    No Known Allergies Current Outpatient Medications on File Prior to Visit  Medication Sig Dispense Refill  . [name] 50 MG tablet  TAKE 1 TABLET BY MOUTH AT BEDTIME 90 tablet 1  . apixaban (ELIQUIS) 5 MG TABS tablet Take 1 tablet (5 mg total) by mouth 2 (two) times daily. 180 tablet 3  . b complex vitamins capsule Take 1 capsule by mouth 2 (two) times daily.    Marland Kitchen co-enzyme Q-10 50 MG capsule Take 50 mg by mouth daily.    . cyclobenzaprine (FLEXERIL) 5 MG tablet Take 1 tablet (5 mg total) by mouth 2 (two) times daily as needed for muscle spasms. 20 tablet 0  . ezetimibe (ZETIA) 10 MG tablet TAKE 1 TABLET BY MOUTH DAILY GENERIC EQUIVALENT FOR ZETIA 90 tablet 2  . glipiZIDE (GLUCOTROL XL) 2.5 MG 24 hr tablet TAKE 1 TABLET BY MOUTH DAILY WITH BREAKFAST 90 tablet 0  . glucose blood (ACCU-CHEK AVIVA PLUS) test strip Use to test blood sugar up to 2 times daily 300 each 3  . ibandronate (BONIVA) 150 MG tablet Take one tablet by mouth once monthly.Take in morning with water, on  an empty stomach, without medications, food, or lying down for 30 min. 4 tablet 3  . JANUMET 50-1000 MG tablet TAKE 1 TABLET BY MOUTH TWICE DAILY WITH A MEAL. 180 tablet 1  . levothyroxine (SYNTHROID, LEVOTHROID) 88 MCG tablet TAKE 1 TABLET BY MOUTH EVERY DAY BEFORE BREAKFAST 90 tablet 3  . Magnesium 500 MG CAPS Take 1 capsule by mouth 2 (two) times daily.    . metoprolol tartrate (LOPRESSOR) 25 MG tablet Take 1 tablet (25 mg total) by mouth 2 (two) times daily. (Patient taking differently: Take 25 mg by mouth as needed. ) 180 tablet 3  . Multiple Vitamins-Minerals (MULTIVITAMIN ADULT PO) Take 1 capsule by mouth 2 (two) times daily.     . propafenone (RYTHMOL SR) 225 MG 12 hr capsule Take 1 capsule (225 mg total) by mouth 2 (two) times daily. 180 capsule 3  . quinapril (ACCUPRIL) 20 MG tablet TAKE 1 TABLET BY MOUTH AT BEDTIME. GENERIC EQUIVALENT FOR ACCUPRIL. 90 tablet 3  . ranitidine (ZANTAC) 150 MG tablet Take 1 tablet (150 mg total) by mouth 2 (two) times daily. 180 tablet 0  . rosuvastatin (CRESTOR) 5 MG tablet Take 1 tablet (5 mg total) by mouth daily. 90 tablet 2  . venlafaxine XR (EFFEXOR-XR) 37.5 MG 24 hr capsule TAKE ONE CAPSULE BY MOUTH DAILY WITH BREAKFAST 90 capsule 0  . verapamil (CALAN-SR) 120 MG CR tablet Take 1 tablet (120 mg total) by mouth at bedtime. 90 tablet 3   No current facility-administered medications on file prior to visit.     Review of Systems  Constitutional: Positive for appetite change and fatigue. Negative for fever.  HENT: Positive for congestion, postnasal drip, rhinorrhea, sinus pressure, sneezing and sore throat. Negative for ear pain.   Eyes: Negative for pain and discharge.  Respiratory: Positive for cough. Negative for shortness of breath, wheezing and stridor.   Cardiovascular: Negative for chest pain.  Gastrointestinal: Negative for diarrhea, nausea and vomiting.  Genitourinary: Negative for frequency, hematuria and urgency.  Musculoskeletal: Negative  for arthralgias and myalgias.  Skin: Negative for rash.  Neurological: Positive for headaches. Negative for dizziness, weakness and light-headedness.  Psychiatric/Behavioral: Negative for confusion and dysphoric mood.       Objective:   Physical Exam  Constitutional: She appears well-developed and well-nourished. No distress.  Well appearing   HENT:  Head: Normocephalic and atraumatic.  Right Ear: External ear normal.  Left Ear: External ear normal.  Mouth/Throat: Oropharynx is clear and  moist.  Nares are injected and congested  No sinus tenderness Clear rhinorrhea and post nasal drip   Eyes: Pupils are equal, round, and reactive to light. Conjunctivae and EOM are normal. Right eye exhibits no discharge. Left eye exhibits no discharge.  Neck: Normal range of motion. Neck supple.  Cardiovascular: Normal rate and normal heart sounds.  Pulmonary/Chest: Effort normal and breath sounds normal. No respiratory distress. She has no wheezes. She has no rales. She exhibits no tenderness.  Good air exch No wheeze No rales or rhonchi  Lymphadenopathy:    She has no cervical adenopathy.  Neurological: She is alert.  Skin: Skin is warm and dry. No rash noted. No pallor.  Psychiatric: She has a normal mood and affect.          Assessment & Plan:   Problem List Items Addressed This Visit      Respiratory   Viral URI with cough - Primary    With reassuring exam  Has hx of reactive airways in past with uri- not wheezing now but given prednisone px to hold in case she needs it over the weekend Also atrovent mdi if needed (she does not tolerate albuterol re: heart rate)  (will also update [place]) Delsym/expectorant prn  Fluids/rest Disc symptomatic care - see instructions on AVS  Update if not starting to improve in a week or if worsening

## 2017-06-11 NOTE — Assessment & Plan Note (Addendum)
With reassuring exam  Has hx of reactive airways in past with uri- not wheezing now but given prednisone px to hold in case she needs it over the weekend Also atrovent mdi if needed (she does not tolerate albuterol re: heart rate)  (will also update [place]) Delsym/expectorant prn  Fluids/rest Disc symptomatic care - see instructions on AVS  Update if not starting to improve in a week or if worsening

## 2017-06-19 ENCOUNTER — Telehealth: Payer: Self-pay | Admitting: Primary Care

## 2017-06-19 DIAGNOSIS — M8580 Other specified disorders of bone density and structure, unspecified site: Secondary | ICD-10-CM

## 2017-06-19 MED ORDER — IBANDRONATE SODIUM 150 MG PO TABS: ORAL_TABLET | ORAL | 3 refills | Status: DC

## 2017-06-19 NOTE — Telephone Encounter (Signed)
Copied from [place] [id number]. Topic: Quick Communication - Rx Refill/Question >> Jun 19, 2017 12:49 PM Yvette Rack wrote: Medication: ibandronate (BONIVA) 150 MG tablet Has the patient contacted their pharmacy? No.  She never used this pharmacy before for the RX (Agent: If no, request that the patient contact the pharmacy for the refill.) Preferred Pharmacy (with phone number or street name): [id number] - [place], [place] [phone] (Phone) [phone] (Fax)     Agent: Please be advised that RX refills may take up to 3 business days. We ask that you follow-up with your pharmacy.

## 2017-06-20 ENCOUNTER — Encounter: Payer: Self-pay | Admitting: Primary Care

## 2017-06-20 ENCOUNTER — Ambulatory Visit (INDEPENDENT_AMBULATORY_CARE_PROVIDER_SITE_OTHER): Payer: Managed Care, Other (non HMO) | Admitting: Primary Care

## 2017-06-20 VITALS — BP 124/70 | HR 82 | Temp 98.1°F | Ht 66.0 in | Wt 177.5 lb

## 2017-06-20 DIAGNOSIS — J209 Acute bronchitis, unspecified: Secondary | ICD-10-CM

## 2017-06-20 DIAGNOSIS — M2550 Pain in unspecified joint: Secondary | ICD-10-CM

## 2017-06-20 MED ORDER — DICLOFENAC SODIUM 1 % TD GEL: 2.0000 g | Freq: Four times a day (QID) | TRANSDERMAL | 0 refills | Status: DC

## 2017-06-20 MED ORDER — AZITHROMYCIN 250 MG PO TABS: ORAL_TABLET | ORAL | 0 refills | Status: DC

## 2017-06-20 MED ORDER — IPRATROPIUM BROMIDE HFA 17 MCG/ACT IN AERS: 2.0000 | INHALATION_SPRAY | RESPIRATORY_TRACT | 0 refills | Status: DC | PRN

## 2017-06-20 NOTE — Progress Notes (Signed)
Subjective:    Patient ID: Debra Perez, female    DOB: [birth year], 62 y.o.   MRN: [id number]  HPI  Debra Perez is a 61 year old female with a history of paroxysmal atrial fibrillation, CAD, GERD, type 2 diabetes who presents today with a chief complaint of cough. She also reports fevers, fatigue, wheezing, shortness of breath.   She'd also like to try Voltaren Gel for osteoarthritis. She recent tired some of her husbands and noticed improvement. She cannot take oral NSAID's due to chronic anticoagulation with Eliquis.   Her cough has been present for the past four weeks. She's been running low grade fevers intermittently, and continues to feel fatigued most everyday in the afternoon.She was last evaluated on 06/11/17 for five day history complaints of scratchy throat, cough. Her symptoms were suspected to be secondary to a viral URI so she was provided with a prescription for Atrovent and prednisone. She lost the prednisone prescription but had an older one that was prescribed by myself, she filled it and felt little to no improvement.   Overall she's not feeling better.  Review of Systems  Constitutional: Positive for fatigue and fever.  HENT: Positive for congestion. Negative for ear pain, sinus pressure and sore throat.   Respiratory: Positive for cough, shortness of breath and wheezing.   Cardiovascular: Negative for chest pain.  Musculoskeletal: Positive for arthralgias.       Past Medical History:  Diagnosis Date  . Atrial fibrillation ([place])   . 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 Diabetes mellitus without complication ([place])   . GERD (gastroesophageal reflux disease)   . History of colonic polyps    benign  . Hyperlipidemia   . Hypertension   . Lung infection   . Mitral valve prolapse   . Obstructive sleep apnea    on CPAP  . Sleep apnea   . Thyroid disease   . Ventricular tachycardia (paroxysmal) ([facility])      Social History   Socioeconomic History  .  Marital status: Married    Spouse name: Not on file  . Number of children: Not on file  . Years of education: Not on file  . Highest education level: Not on file  Occupational History  . Not on file  Social Needs  . Financial resource strain: Not on file  . Food insecurity:    Worry: Not on file    Inability: Not on file  . Transportation needs:    Medical: Not on file    Non-medical: Not on file  Tobacco Use  . Smoking status: Former Smoker    Packs/day: 1.00    Years: 20.00    Pack years: 20.00    Types: Cigarettes    Last attempt to quit: 03/07/2005    Years since quitting: 12.2  . Smokeless tobacco: Never Used  Substance and Sexual Activity  . Alcohol use: No    Alcohol/week: 0.0 oz  . Drug use: No  . Sexual activity: Not on file  Lifestyle  . Physical activity:    Days per week: Not on file    Minutes per session: Not on file  . Stress: Not on file  Relationships  . Social connections:    Talks on phone: Not on file    Gets together: Not on file    Attends religious service: Not on file    Active member of club or organization: Not on file    Attends meetings  of clubs or organizations: Not on file    Relationship status: Not on file  . Intimate partner violence:    Fear of current or ex partner: Not on file    Emotionally abused: Not on file    Physically abused: Not on file    Forced sexual activity: Not on file  Other Topics Concern  . Not on file  Social History Narrative   Married.   1 daughter.   Moved here from [place] due to husbands occupation.   She is a Corporate treasurer.   Enjoys sewing, gardening.    Past Surgical History:  Procedure Laterality Date  . ABDOMINAL HYSTERECTOMY  2002   ovaries remain  . CLAVICLE SURGERY  2007   right clavicle plate pins  . KNEE ARTHROSCOPY  2012    Family History  Problem Relation Age of Onset  . Depression Mother   . Sudden death Mother 56  . Heart attack Mother   . Arthritis Father   . Heart disease Father   .  Hypertension Father   . Diabetes Sister   . Diabetes Brother   . Diabetes Maternal Grandfather   . Breast cancer Maternal Grandmother     No Known Allergies  Current Outpatient Medications on File Prior to Visit  Medication Sig Dispense Refill  . [name] 50 MG tablet TAKE 1 TABLET BY MOUTH AT BEDTIME 90 tablet 1  . apixaban (ELIQUIS) 5 MG TABS tablet Take 1 tablet (5 mg total) by mouth 2 (two) times daily. 180 tablet 3  . b complex vitamins capsule Take 1 capsule by mouth 2 (two) times daily.    Marland Kitchen co-enzyme Q-10 50 MG capsule Take 50 mg by mouth daily.    . cyclobenzaprine (FLEXERIL) 5 MG tablet Take 1 tablet (5 mg total) by mouth 2 (two) times daily as needed for muscle spasms. 20 tablet 0  . ezetimibe (ZETIA) 10 MG tablet TAKE 1 TABLET BY MOUTH DAILY GENERIC EQUIVALENT FOR ZETIA 90 tablet 2  . glipiZIDE (GLUCOTROL XL) 2.5 MG 24 hr tablet TAKE 1 TABLET BY MOUTH DAILY WITH BREAKFAST 90 tablet 0  . glucose blood (ACCU-CHEK AVIVA PLUS) test strip Use to test blood sugar up to 2 times daily 300 each 3  . ibandronate (BONIVA) 150 MG tablet Take one tablet by mouth once monthly.Take in morning with water, on an empty stomach, without medications, food, or lying down for 30 min. 4 tablet 3  . JANUMET 50-1000 MG tablet TAKE 1 TABLET BY MOUTH TWICE DAILY WITH A MEAL. 180 tablet 1  . levothyroxine (SYNTHROID, LEVOTHROID) 88 MCG tablet TAKE 1 TABLET BY MOUTH EVERY DAY BEFORE BREAKFAST 90 tablet 3  . Magnesium 500 MG CAPS Take 1 capsule by mouth 2 (two) times daily.    . metoprolol tartrate (LOPRESSOR) 25 MG tablet Take 1 tablet (25 mg total) by mouth 2 (two) times daily. (Patient taking differently: Take 25 mg by mouth as needed. ) 180 tablet 3  . Multiple Vitamins-Minerals (MULTIVITAMIN ADULT PO) Take 1 capsule by mouth 2 (two) times daily.     . propafenone (RYTHMOL SR) 225 MG 12 hr capsule Take 1 capsule (225 mg total) by mouth 2 (two) times daily. 180 capsule 3  . quinapril  (ACCUPRIL) 20 MG tablet TAKE 1 TABLET BY MOUTH AT BEDTIME. GENERIC EQUIVALENT FOR ACCUPRIL. 90 tablet 3  . ranitidine (ZANTAC) 150 MG tablet Take 1 tablet (150 mg total) by mouth 2 (two) times daily. 180 tablet 0  .  rosuvastatin (CRESTOR) 5 MG tablet Take 1 tablet (5 mg total) by mouth daily. 90 tablet 2  . venlafaxine XR (EFFEXOR-XR) 37.5 MG 24 hr capsule TAKE ONE CAPSULE BY MOUTH DAILY WITH BREAKFAST 90 capsule 0  . verapamil (CALAN-SR) 120 MG CR tablet Take 1 tablet (120 mg total) by mouth at bedtime. 90 tablet 3   No current facility-administered medications on file prior to visit.     BP 124/70   Pulse 82   Temp 98.1 F (36.7 C) (Oral)   Ht 5\' 6"  (1.676 m)   Wt 177 lb 8 oz (80.5 kg)   SpO2 99%   BMI 28.65 kg/m    Objective:   Physical Exam  Constitutional: She appears well-nourished.  HENT:  Right Ear: Tympanic membrane and ear canal normal.  Left Ear: Tympanic membrane and ear canal normal.  Nose: Right sinus exhibits no maxillary sinus tenderness and no frontal sinus tenderness. Left sinus exhibits no maxillary sinus tenderness and no frontal sinus tenderness.  Mouth/Throat: Oropharynx is clear and moist.  Eyes: Conjunctivae are normal.  Neck: Neck supple.  Cardiovascular: Normal rate and regular rhythm.  Pulmonary/Chest: Effort normal. She has no wheezes. She has rhonchi in the right lower field and the left lower field. She has no rales.  Dry cough during exam  Lymphadenopathy:    She has no cervical adenopathy.  Skin: Skin is warm and dry.          Assessment & Plan:  Acute Bronchitis:  Cough, fatigue, fevers, wheezing, shortness of breath x 3-4 weeks. Little to no improvement with OTC and Rx treatment. Exam today consistent for bronchitis, likely bacterial at this point. Rx for Zpak course sent to pharmacy. Fluids, rest, follow up PRN.  Pleas [name], NP

## 2017-06-20 NOTE — Patient Instructions (Addendum)
Start Azithromycin antibiotics for infection. Take 2 tablets by mouth today, then 1 tablet daily for 4 additional days.  I sent refills for the ipratropium inhaler to your pharmacy.  Make sure to stay hydrated and rest.  It was a pleasure to see you today!

## 2017-06-20 NOTE — Assessment & Plan Note (Signed)
Rx for Voltaren Gel sent to pharmacy. Cannot take oral NSAID's due to use of Eliquis.

## 2017-06-27 ENCOUNTER — Encounter: Payer: Managed Care, Other (non HMO) | Admitting: Primary Care

## 2017-07-18 ENCOUNTER — Encounter: Payer: Managed Care, Other (non HMO) | Admitting: Primary Care

## 2017-07-19 ENCOUNTER — Encounter: Payer: Self-pay | Admitting: Primary Care

## 2017-07-19 ENCOUNTER — Ambulatory Visit (INDEPENDENT_AMBULATORY_CARE_PROVIDER_SITE_OTHER): Payer: Managed Care, Other (non HMO) | Admitting: Primary Care

## 2017-07-19 VITALS — BP 122/72 | HR 88 | Temp 98.1°F | Ht 66.0 in | Wt 178.5 lb

## 2017-07-19 DIAGNOSIS — Z1211 Encounter for screening for malignant neoplasm of colon: Secondary | ICD-10-CM

## 2017-07-19 DIAGNOSIS — K219 Gastro-esophageal reflux disease without esophagitis: Secondary | ICD-10-CM

## 2017-07-19 DIAGNOSIS — Z1231 Encounter for screening mammogram for malignant neoplasm of breast: Secondary | ICD-10-CM

## 2017-07-19 DIAGNOSIS — E039 Hypothyroidism, unspecified: Secondary | ICD-10-CM | POA: Diagnosis not present

## 2017-07-19 DIAGNOSIS — I480 Paroxysmal atrial fibrillation: Secondary | ICD-10-CM | POA: Diagnosis not present

## 2017-07-19 DIAGNOSIS — E1169 Type 2 diabetes mellitus with other specified complication: Secondary | ICD-10-CM | POA: Diagnosis not present

## 2017-07-19 DIAGNOSIS — E782 Mixed hyperlipidemia: Secondary | ICD-10-CM

## 2017-07-19 DIAGNOSIS — G8929 Other chronic pain: Secondary | ICD-10-CM

## 2017-07-19 DIAGNOSIS — Z1239 Encounter for other screening for malignant neoplasm of breast: Secondary | ICD-10-CM

## 2017-07-19 DIAGNOSIS — Z0000 Encounter for general adult medical examination without abnormal findings: Secondary | ICD-10-CM | POA: Diagnosis not present

## 2017-07-19 DIAGNOSIS — R232 Flushing: Secondary | ICD-10-CM | POA: Diagnosis not present

## 2017-07-19 DIAGNOSIS — M549 Dorsalgia, unspecified: Secondary | ICD-10-CM

## 2017-07-19 DIAGNOSIS — Z1159 Encounter for screening for other viral diseases: Secondary | ICD-10-CM

## 2017-07-19 DIAGNOSIS — M5441 Lumbago with sciatica, right side: Secondary | ICD-10-CM

## 2017-07-19 DIAGNOSIS — I10 Essential (primary) hypertension: Secondary | ICD-10-CM | POA: Diagnosis not present

## 2017-07-19 DIAGNOSIS — I25119 Atherosclerotic heart disease of native coronary artery with unspecified angina pectoris: Secondary | ICD-10-CM

## 2017-07-19 DIAGNOSIS — M8580 Other specified disorders of bone density and structure, unspecified site: Secondary | ICD-10-CM

## 2017-07-19 MED ORDER — CYCLOBENZAPRINE HCL 5 MG PO TABS: 5.0000 mg | ORAL_TABLET | Freq: Every evening | ORAL | 0 refills | Status: DC | PRN

## 2017-07-19 NOTE — Assessment & Plan Note (Signed)
Repeat lipids pending, she will return in 1 month for labs. Discussed the importance of a healthy diet and regular exercise in order for weight loss, and to reduce the risk of any potential medical problems.

## 2017-07-19 NOTE — Assessment & Plan Note (Signed)
Following with cardiology, continue lipid and BP control. Continue current regimen.

## 2017-07-19 NOTE — Assessment & Plan Note (Signed)
Stable in the office today, continue current regimen. 

## 2017-07-19 NOTE — Assessment & Plan Note (Signed)
Overall doing well of of PPI, continue same.

## 2017-07-19 NOTE — Assessment & Plan Note (Signed)
Following with orthopedics, appointment scheduled for tomorrow. Refilled Cyclobenzaprine to use PRN.

## 2017-07-19 NOTE — Assessment & Plan Note (Signed)
Doing well on Effexor, continue same. Denies SI/HI.  

## 2017-07-19 NOTE — Progress Notes (Signed)
Subjective:    Patient ID: Debra Perez, female    DOB: [birth year], 62 y.o.   MRN: [id number]  HPI  [name] is a 62 year old female who presents today for complete physical.  Immunizations: -Tetanus: Completed in 2016 -Influenza: Completed last season  -Pneumonia: Completed in 2014 -Shingles: Never completed   Diet: She endorses a poor diet Breakfast: Waffle with sugar free syrup, fruit and peanut Lunch: Vegetables Dinner: Meat, vegetables, restaurants (3-4 nights weekly) Snacks: Ice cream sandwiches, candy bars, nuts, ice cream Desserts: Daily  Beverages: Water  Exercise: She is not exercising regularly, is active in her yard and around her home. Eye exam: Completed in April 2019 Dental exam: Completes regularly  Colonoscopy: Completed in 2014, due this year.  Dexa: Completed in 2019, osteopenia Pap Smear: Hysterectomy  Mammogram: Completed in March 2018 Hep C Screen: Due   Review of Systems  Constitutional: Negative for unexpected weight change.  HENT: Negative for rhinorrhea.   Respiratory: Negative for cough and shortness of breath.   Cardiovascular: Negative for chest pain.  Gastrointestinal: Negative for constipation and diarrhea.  Genitourinary: Negative for difficulty urinating and menstrual problem.  Musculoskeletal: Positive for arthralgias and back pain. Negative for myalgias.  Skin: Negative for rash.  Allergic/Immunologic: Negative for environmental allergies.  Neurological: Negative for dizziness, numbness and headaches.  Psychiatric/Behavioral: The patient is not nervous/anxious.        Past Medical History:  Diagnosis Date  . Atrial fibrillation ([place])   . 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 Diabetes mellitus without complication ([place])   . GERD (gastroesophageal reflux disease)   . History of colonic polyps    benign  . Hyperlipidemia   . Hypertension   . Lung infection   . Mitral valve prolapse   . Obstructive sleep apnea     on CPAP  . Sleep apnea   . Thyroid disease   . Ventricular tachycardia (paroxysmal) ([facility])      Social History   Socioeconomic History  . Marital status: Married    Spouse name: Not on file  . Number of children: Not on file  . Years of education: Not on file  . Highest education level: Not on file  Occupational History  . Not on file  Social Needs  . Financial resource strain: Not on file  . Food insecurity:    Worry: Not on file    Inability: Not on file  . Transportation needs:    Medical: Not on file    Non-medical: Not on file  Tobacco Use  . Smoking status: Former Smoker    Packs/day: 1.00    Years: 20.00    Pack years: 20.00    Types: Cigarettes    Last attempt to quit: 03/07/2005    Years since quitting: 12.3  . Smokeless tobacco: Never Used  Substance and Sexual Activity  . Alcohol use: No    Alcohol/week: 0.0 oz  . Drug use: No  . Sexual activity: Not on file  Lifestyle  . Physical activity:    Days per week: Not on file    Minutes per session: Not on file  . Stress: Not on file  Relationships  . Social connections:    Talks on phone: Not on file    Gets together: Not on file    Attends religious service: Not on file    Active member of club or organization: Not on file    Attends meetings of  clubs or organizations: Not on file    Relationship status: Not on file  . Intimate partner violence:    Fear of current or ex partner: Not on file    Emotionally abused: Not on file    Physically abused: Not on file    Forced sexual activity: Not on file  Other Topics Concern  . Not on file  Social History Narrative   Married.   1 daughter.   Moved here from [place] due to husbands occupation.   She is a Corporate treasurer.   Enjoys sewing, gardening.    Past Surgical History:  Procedure Laterality Date  . ABDOMINAL HYSTERECTOMY  2002   ovaries remain  . CLAVICLE SURGERY  2007   right clavicle plate pins  . KNEE ARTHROSCOPY  2012    Family History  Problem  Relation Age of Onset  . Depression Mother   . Sudden death Mother 21  . Heart attack Mother   . Arthritis Father   . Heart disease Father   . Hypertension Father   . Diabetes Sister   . Diabetes Brother   . Diabetes Maternal Grandfather   . Breast cancer Maternal Grandmother     No Known Allergies  Current Outpatient Medications on File Prior to Visit  Medication Sig Dispense Refill  . [name] 50 MG tablet TAKE 1 TABLET BY MOUTH AT BEDTIME 90 tablet 1  . apixaban (ELIQUIS) 5 MG TABS tablet Take 1 tablet (5 mg total) by mouth 2 (two) times daily. 180 tablet 3  . b complex vitamins capsule Take 1 capsule by mouth 2 (two) times daily.    Marland Kitchen co-enzyme Q-10 50 MG capsule Take 50 mg by mouth daily.    . diclofenac sodium (VOLTAREN) 1 % GEL Apply 2 g topically 4 (four) times daily. 100 g 0  . ezetimibe (ZETIA) 10 MG tablet TAKE 1 TABLET BY MOUTH DAILY GENERIC EQUIVALENT FOR ZETIA 90 tablet 2  . glipiZIDE (GLUCOTROL XL) 2.5 MG 24 hr tablet TAKE 1 TABLET BY MOUTH DAILY WITH BREAKFAST 90 tablet 0  . glucose blood (ACCU-CHEK AVIVA PLUS) test strip Use to test blood sugar up to 2 times daily 300 each 3  . ibandronate (BONIVA) 150 MG tablet Take one tablet by mouth once monthly.Take in morning with water, on an empty stomach, without medications, food, or lying down for 30 min. 4 tablet 3  . ipratropium (ATROVENT HFA) 17 MCG/ACT inhaler Inhale 2 puffs into the lungs every 4 (four) hours as needed for wheezing. Wheezing and tight chest 1 Inhaler 0  . JANUMET 50-1000 MG tablet TAKE 1 TABLET BY MOUTH TWICE DAILY WITH A MEAL. 180 tablet 1  . levothyroxine (SYNTHROID, LEVOTHROID) 88 MCG tablet TAKE 1 TABLET BY MOUTH EVERY DAY BEFORE BREAKFAST 90 tablet 3  . Magnesium 500 MG CAPS Take 1 capsule by mouth 2 (two) times daily.    . metoprolol tartrate (LOPRESSOR) 25 MG tablet Take 1 tablet (25 mg total) by mouth 2 (two) times daily. (Patient taking differently: Take 25 mg by mouth as needed. )  180 tablet 3  . Multiple Vitamins-Minerals (MULTIVITAMIN ADULT PO) Take 1 capsule by mouth 2 (two) times daily.     . propafenone (RYTHMOL SR) 225 MG 12 hr capsule Take 1 capsule (225 mg total) by mouth 2 (two) times daily. 180 capsule 3  . quinapril (ACCUPRIL) 20 MG tablet TAKE 1 TABLET BY MOUTH AT BEDTIME. GENERIC EQUIVALENT FOR ACCUPRIL. 90 tablet 3  . ranitidine (ZANTAC)  150 MG tablet Take 1 tablet (150 mg total) by mouth 2 (two) times daily. 180 tablet 0  . rosuvastatin (CRESTOR) 5 MG tablet Take 1 tablet (5 mg total) by mouth daily. 90 tablet 2  . venlafaxine XR (EFFEXOR-XR) 37.5 MG 24 hr capsule TAKE ONE CAPSULE BY MOUTH DAILY WITH BREAKFAST 90 capsule 0  . verapamil (CALAN-SR) 120 MG CR tablet Take 1 tablet (120 mg total) by mouth at bedtime. 90 tablet 3   No current facility-administered medications on file prior to visit.     BP 122/72   Pulse 88   Temp 98.1 F (36.7 C) (Oral)   Ht 5\' 6"  (1.676 m)   Wt 178 lb 8 oz (81 kg)   SpO2 97%   BMI 28.81 kg/m    Objective:   Physical Exam  Constitutional: She is oriented to person, place, and time. She appears well-nourished.  HENT:  Right Ear: Tympanic membrane and ear canal normal.  Left Ear: Tympanic membrane and ear canal normal.  Nose: Nose normal.  Mouth/Throat: Oropharynx is clear and moist.  Eyes: Pupils are equal, round, and reactive to light. Conjunctivae and EOM are normal.  Neck: Neck supple. No thyromegaly present.  Cardiovascular: Normal rate and regular rhythm.  No murmur heard. Pulmonary/Chest: Effort normal and breath sounds normal. She has no rales.  Abdominal: Soft. Bowel sounds are normal. There is no tenderness.  Musculoskeletal: Normal range of motion.  Lymphadenopathy:    She has no cervical adenopathy.  Neurological: She is alert and oriented to person, place, and time. She has normal reflexes. No cranial nerve deficit.  Skin: Skin is warm and dry. No rash noted.  Psychiatric: She has a normal mood  and affect.          Assessment & Plan:

## 2017-07-19 NOTE — Assessment & Plan Note (Signed)
Immunizations UTD, pneumonia vaccination due in October 2019. Mammogram due, orders placed. Colonoscopy due, referral placed and requested records from [place]. Discussed the importance of a healthy diet and regular exercise in order for weight loss, and to reduce the risk of any potential medical problems. Exam unremarkable. Labs pending. Follow up in 1 year for CPE.

## 2017-07-19 NOTE — Patient Instructions (Addendum)
Start exercising. You should be getting 150 minutes of moderate intensity exercise weekly.  It's important to improve your diet by reducing consumption of fast food, fried food, processed snack foods, sugary drinks. Increase consumption of fresh vegetables and fruits, whole grains, water.  Ensure you are drinking 64 ounces of water daily.  You will be contacted regarding your referral to GI for the colonoscopy.  Please let us know if you have not been contacted within one week.   Call [facility] to schedule your mammogram.   Take the Shingles vaccination to your pharmacy for administration.  Please schedule a follow up appointment in 6 months for diabetes check.  It was a pleasure to see you today!   Preventive Care 40-64 Years, Female Preventive care refers to lifestyle choices and visits with your health care provider that can promote health and wellness. What does preventive care include?  A yearly physical exam. This is also called an annual well check.  Dental exams once or twice a year.  Routine eye exams. Ask your health care provider how often you should have your eyes checked.  Personal lifestyle choices, including: ? Daily care of your teeth and gums. ? Regular physical activity. ? Eating a healthy diet. ? Avoiding tobacco and drug use. ? Limiting alcohol use. ? Practicing safe sex. ? Taking low-dose aspirin daily starting at age 51. ? Taking vitamin and mineral supplements as recommended by your health care provider. What happens during an annual well check? The services and screenings done by your health care provider during your annual well check will depend on your age, overall health, lifestyle risk factors, and family history of disease. Counseling Your health care provider may ask you questions about your:  Alcohol use.  Tobacco use.  Drug use.  Emotional well-being.  Home and relationship well-being.  Sexual activity.  Eating habits.  Work and work  Statistician.  Method of birth control.  Menstrual cycle.  Pregnancy history.  Screening You may have the following tests or measurements:  Height, weight, and BMI.  Blood pressure.  Lipid and cholesterol levels. These may be checked every 5 years, or more frequently if you are over 1 years old.  Skin check.  Lung cancer screening. You may have this screening every year starting at age 65 if you have a 30-pack-year history of smoking and currently smoke or have quit within the past 15 years.  Fecal occult blood test (FOBT) of the stool. You may have this test every year starting at age 59.  Flexible sigmoidoscopy or colonoscopy. You may have a sigmoidoscopy every 5 years or a colonoscopy every 10 years starting at age 33.  Hepatitis C blood test.  Hepatitis B blood test.  Sexually transmitted disease (STD) testing.  Diabetes screening. This is done by checking your blood sugar (glucose) after you have not eaten for a while (fasting). You may have this done every 1-3 years.  Mammogram. This may be done every 1-2 years. Talk to your health care provider about when you should start having regular mammograms. This may depend on whether you have a family history of breast cancer.  BRCA-related cancer screening. This may be done if you have a family history of breast, ovarian, tubal, or peritoneal cancers.  Pelvic exam and Pap test. This may be done every 3 years starting at age 21. Starting at age 37, this may be done every 5 years if you have a Pap test in combination with an HPV test.  Bone  density scan. This is done to screen for osteoporosis. You may have this scan if you are at high risk for osteoporosis.  Discuss your test results, treatment options, and if necessary, the need for more tests with your health care provider. Vaccines Your health care provider may recommend certain vaccines, such as:  Influenza vaccine. This is recommended every year.  Tetanus,  diphtheria, and acellular pertussis (Tdap, Td) vaccine. You may need a Td booster every 10 years.  Varicella vaccine. You may need this if you have not been vaccinated.  Zoster vaccine. You may need this after age 41.  Measles, mumps, and rubella (MMR) vaccine. You may need at least one dose of MMR if you were born in 1957 or later. You may also need a second dose.  Pneumococcal 13-valent conjugate (PCV13) vaccine. You may need this if you have certain conditions and were not previously vaccinated.  Pneumococcal polysaccharide (PPSV23) vaccine. You may need one or two doses if you smoke cigarettes or if you have certain conditions.  Meningococcal vaccine. You may need this if you have certain conditions.  Hepatitis A vaccine. You may need this if you have certain conditions or if you travel or work in places where you may be exposed to hepatitis A.  Hepatitis B vaccine. You may need this if you have certain conditions or if you travel or work in places where you may be exposed to hepatitis B.  Haemophilus influenzae type b (Hib) vaccine. You may need this if you have certain conditions.  Talk to your health care provider about which screenings and vaccines you need and how often you need them. This information is not intended to replace advice given to you by your health care provider. Make sure you discuss any questions you have with your health care provider. Document Released: 03/20/2015 Document Revised: 11/11/2015 Document Reviewed: 12/23/2014 Elsevier Interactive Patient Education  [employer].

## 2017-07-19 NOTE — Assessment & Plan Note (Signed)
Boniva causing "bone pain". She will continue to take and notify if she cannot tolerate side effects. Repeat bone density scan in 2021.

## 2017-07-19 NOTE — Assessment & Plan Note (Signed)
Patient requesting to wait another month for A1C check. Labs ordered and pended. Discussed to work on diet and start exercising.   Foot exam today. Managed on ACE and statin. Pneumonia vaccination due in October 2019. Eye exam UTD.   Follow up in 6 months.

## 2017-07-19 NOTE — Assessment & Plan Note (Signed)
Repeat TSH pending, continue levothyroxine 88 mcg for now.

## 2017-07-19 NOTE — Assessment & Plan Note (Signed)
Rate and rhythm regular today, continue Eliquis and metoprolol.

## 2017-07-20 ENCOUNTER — Telehealth: Payer: Self-pay

## 2017-07-20 NOTE — Telephone Encounter (Signed)
Left message for patient to call Debra back in regards to a referral-Debra Perez, RMA   

## 2017-07-21 ENCOUNTER — Telehealth: Payer: Self-pay

## 2017-07-21 NOTE — Telephone Encounter (Signed)
Patient wanted to let you know that she saw orthopedic,Dr [facility], yesterday and he ordered CT of abdomen-to see if anything shows in abdomen area that is contributing to her lower back pain, also ordered a lot of labs to rule out cancer and a 24 hour urine test. She just wanted to let you know.

## 2017-07-24 NOTE — Telephone Encounter (Signed)
Noted, appreciate update

## 2017-07-31 ENCOUNTER — Encounter: Payer: Self-pay | Admitting: Primary Care

## 2017-08-07 DIAGNOSIS — R102 Pelvic and perineal pain: Secondary | ICD-10-CM | POA: Insufficient documentation

## 2017-08-08 ENCOUNTER — Other Ambulatory Visit: Payer: Self-pay | Admitting: Primary Care

## 2017-08-10 ENCOUNTER — Other Ambulatory Visit: Payer: Self-pay | Admitting: Orthopedic Surgery

## 2017-08-10 DIAGNOSIS — R102 Pelvic and perineal pain: Secondary | ICD-10-CM

## 2017-08-17 ENCOUNTER — Encounter: Payer: Self-pay | Admitting: Primary Care

## 2017-08-21 ENCOUNTER — Other Ambulatory Visit: Payer: Self-pay | Admitting: Orthopedic Surgery

## 2017-08-21 DIAGNOSIS — R102 Pelvic and perineal pain: Secondary | ICD-10-CM

## 2017-08-21 DIAGNOSIS — N949 Unspecified condition associated with female genital organs and menstrual cycle: Secondary | ICD-10-CM

## 2017-08-24 ENCOUNTER — Encounter: Payer: Self-pay | Admitting: Primary Care

## 2017-08-24 ENCOUNTER — Other Ambulatory Visit (INDEPENDENT_AMBULATORY_CARE_PROVIDER_SITE_OTHER): Payer: Managed Care, Other (non HMO)

## 2017-08-24 DIAGNOSIS — I10 Essential (primary) hypertension: Secondary | ICD-10-CM

## 2017-08-24 DIAGNOSIS — E782 Mixed hyperlipidemia: Secondary | ICD-10-CM | POA: Diagnosis not present

## 2017-08-24 DIAGNOSIS — E039 Hypothyroidism, unspecified: Secondary | ICD-10-CM

## 2017-08-24 DIAGNOSIS — E1169 Type 2 diabetes mellitus with other specified complication: Secondary | ICD-10-CM

## 2017-08-24 DIAGNOSIS — E1159 Type 2 diabetes mellitus with other circulatory complications: Secondary | ICD-10-CM

## 2017-08-24 DIAGNOSIS — Z1159 Encounter for screening for other viral diseases: Secondary | ICD-10-CM

## 2017-08-24 LAB — LIPID PANEL
CHOL/HDL RATIO: 2
Cholesterol: 123 mg/dL (ref 0–200)
HDL: 61.4 mg/dL (ref >39.00)
LDL CALC: 46 mg/dL (ref 0–99)
NONHDL: 61.3
Triglycerides: 78 mg/dL (ref 0.0–149.0)
VLDL: 15.6 mg/dL (ref 0.0–40.0)

## 2017-08-24 LAB — COMPREHENSIVE METABOLIC PANEL
ALK PHOS: 43 U/L (ref 39–117)
ALT: 27 U/L (ref 0–35)
AST: 15 U/L (ref 0–37)
Albumin: 4.3 g/dL (ref 3.5–5.2)
BUN: 15 mg/dL (ref 6–23)
CO2: 33 meq/L — AB (ref 19–32)
Calcium: 9.4 mg/dL (ref 8.4–10.5)
Chloride: 99 mEq/L (ref 96–112)
Creatinine, Ser: 0.8 mg/dL (ref 0.40–1.20)
GFR: 77.89 mL/min (ref >60.00)
GLUCOSE: 140 mg/dL — AB (ref 70–99)
POTASSIUM: 4 meq/L (ref 3.5–5.1)
SODIUM: 139 meq/L (ref 135–145)
TOTAL PROTEIN: 7.2 g/dL (ref 6.0–8.3)
Total Bilirubin: 0.4 mg/dL (ref 0.2–1.2)

## 2017-08-24 LAB — TSH: TSH: 0.86 u[IU]/mL (ref 0.35–4.50)

## 2017-08-24 LAB — HEMOGLOBIN A1C: Hgb A1c MFr Bld: 7.8 % — ABNORMAL HIGH (ref 4.6–6.5)

## 2017-08-24 MED ORDER — GLIPIZIDE ER 5 MG PO TB24: 5.0000 mg | ORAL_TABLET | Freq: Every day | ORAL | 1 refills | Status: DC

## 2017-08-25 LAB — HEPATITIS C ANTIBODY
Hepatitis C Ab: NONREACTIVE
SIGNAL TO CUT-OFF: 0.01 (ref <1.00)

## 2017-08-29 ENCOUNTER — Ambulatory Visit
Admission: RE | Admit: 2017-08-29 | Discharge: 2017-08-29 | Disposition: A | Discharge status: Home / Self Care | Payer: Managed Care, Other (non HMO) | Source: Ambulatory Visit | Attending: Orthopedic Surgery | Admitting: Orthopedic Surgery

## 2017-08-29 ENCOUNTER — Other Ambulatory Visit: Payer: Managed Care, Other (non HMO)

## 2017-08-29 DIAGNOSIS — R102 Pelvic and perineal pain: Secondary | ICD-10-CM

## 2017-08-29 DIAGNOSIS — N949 Unspecified condition associated with female genital organs and menstrual cycle: Secondary | ICD-10-CM

## 2017-08-30 ENCOUNTER — Telehealth: Payer: Self-pay | Admitting: Cardiovascular Disease

## 2017-08-30 ENCOUNTER — Encounter: Payer: Self-pay | Admitting: Gastroenterology

## 2017-08-30 ENCOUNTER — Ambulatory Visit (INDEPENDENT_AMBULATORY_CARE_PROVIDER_SITE_OTHER): Payer: Managed Care, Other (non HMO) | Admitting: Gastroenterology

## 2017-08-30 VITALS — BP 136/78 | HR 84 | Ht 65.0 in | Wt 176.0 lb

## 2017-08-30 DIAGNOSIS — D509 Iron deficiency anemia, unspecified: Secondary | ICD-10-CM

## 2017-08-30 DIAGNOSIS — K219 Gastro-esophageal reflux disease without esophagitis: Secondary | ICD-10-CM

## 2017-08-30 DIAGNOSIS — Z86010 Personal history of colonic polyps: Secondary | ICD-10-CM

## 2017-08-30 DIAGNOSIS — I059 Rheumatic mitral valve disease, unspecified: Secondary | ICD-10-CM | POA: Insufficient documentation

## 2017-08-30 NOTE — Progress Notes (Signed)
Jonathon Bellows MD, MRCP(U.K) [address]  [place]  [place], [place] [address]  Main: [phone]  Fax: [phone]   Gastroenterology Consultation  Referring Provider:     Pleas [name], NP Primary Care Physician:  Pleas [name], NP Primary Gastroenterologist:  Dr. Jonathon Bellows  Reason for Consultation:     GERD,Colonoscopy         HPI:   Shivali Quackenbush is a 62 y.o. y/o female referred for consultation & management  by Dr. Carlis Abbott, Leticia Penna, NP.    She has been referred for GERD and colonoscopy .    CMP Latest Ref Rng & Units 08/24/2017 04/19/2017 11/09/2016  Glucose 70 - 99 mg/dL 140(H) 117(H) 110(H)  BUN 6 - 23 mg/dL 15 12 8   Creatinine 0.40 - 1.20 mg/dL 0.80 0.74 0.77  Sodium 135 - 145 mEq/L 139 139 139  Potassium 3.5 - 5.1 mEq/L 4.0 4.2 4.2  Chloride 96 - 112 mEq/L 99 104 103  CO2 19 - 32 mEq/L 33(H) 29 27  Calcium 8.4 - 10.5 mg/dL 9.4 9.0 9.3  Total Protein 6.0 - 8.3 g/dL 7.2 - 7.0  Total Bilirubin 0.2 - 1.2 mg/dL 0.4 - 0.3  Alkaline Phos 39 - 117 U/L 43 - 47  AST 0 - 37 U/L 15 - 21  ALT 0 - 35 U/L 27 - 22   CBC Latest Ref Rng & Units 11/09/2016 12/03/2015 04/22/2015  WBC 4.0 - 10.5 K/uL 9.2 9.9 6.2  Hemoglobin 12.0 - 15.0 g/dL 10.3(L) 10.0(L) 10.4(L)  Hematocrit 36.0 - 46.0 % 33.3(L) 32.1(L) 32.5(L)  Platelets 150.0 - 400.0 K/uL 409.0(H) 376 360.0    Reflux:  Onset : Began 5 years back ,with omeprazole BID been controled- if she misses a dose gets symptoms. Her primary care doctor said that she has osteopenia and hence was stopped. Changed to Zantac and says she is doing ok. On it for the past 2 months.   Symptoms: heartburn  Recent weight gain: gained 10 lbs recently  Medications: Zantac  Narcotics or anticholinergics use : none  PPI /H2 blockers or Antacid  use and timing :Zantac  Dinner time : usually at 6 pm - goes to bed around 10 pm - in between she is sitting up   Prior EGD: 3 years back - in [place]- was told she had Barrettes , hiatal  hernia. Was suggested to repeat in 3 years  Family history of esophageal cancer:none Ex smoker No issues swallowing .    Rectal bleeding: no  Nose bleeds: no - on Eloquis  Vaginal bleeding : no  Hematemesis or hemoptysis : no  Blood in urine : no   Sister has thalassemia as well brother.  Brother and sister had colon resection for diverticulitis- No family history of colon polyps. She has had polyps in her colon and was asked to return in 2 years. And has since been advised repeat in 5 years and thinks she is due.     Past Medical History:  Diagnosis Date  . Atrial fibrillation ([place])   . 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 Diabetes mellitus without complication ([place])   . GERD (gastroesophageal reflux disease)   . History of colonic polyps    benign  . Hyperlipidemia   . Hypertension   . Lung infection   . Mitral valve prolapse   . Obstructive sleep apnea    on CPAP  . Sleep apnea   . Thyroid disease   .  Ventricular tachycardia (paroxysmal) [facility])     Past Surgical History:  Procedure Laterality Date  . ABDOMINAL HYSTERECTOMY  2002   ovaries remain  . CLAVICLE SURGERY  2007   right clavicle plate pins  . KNEE ARTHROSCOPY  2012    Prior to Admission medications   Medication Sig Start Date End Date Taking? Authorizing Provider  [name] 50 MG tablet TAKE 1 TABLET BY MOUTH AT BEDTIME 08/08/17   Pleas [name], NP  apixaban (ELIQUIS) 5 MG TABS tablet Take 1 tablet (5 mg total) by mouth 2 (two) times daily. 10/28/16   Minna Merritts, MD  apixaban (ELIQUIS) 5 MG TABS tablet Eliquis 5 mg tablet    [provider]  b complex vitamins capsule Take 1 capsule by mouth 2 (two) times daily.    [provider]  brompheniramine-pseudoephedrine-DM 30-2-10 MG/5ML syrup brompheniramine-pseudoephedrine-DM 2 mg-30 mg-10 mg/5 mL oral syrup    [provider]  co-enzyme Q-10 50 MG capsule Take 50 mg by mouth daily.    [provider]  cyclobenzaprine (FLEXERIL) 5 MG tablet Take 1 tablet (5 mg total) by mouth at bedtime as needed for muscle spasms. 07/19/17   Pleas [name], NP  diazepam (VALIUM) 5 MG tablet TK 1 T PO BID 08/16/17   [provider]  diazepam (VALIUM) 5 MG tablet Valium 5 mg tablet  Take 1 tablet twice a day by oral route.    [provider]  diclofenac sodium (VOLTAREN) 1 % GEL Apply 2 g topically 4 (four) times daily. 06/20/17   Pleas [name], NP  ezetimibe (ZETIA) 10 MG tablet TAKE 1 TABLET BY MOUTH DAILY GENERIC EQUIVALENT FOR ZETIA 02/10/17   Minna Merritts, MD  fluticasone (FLONASE) 50 MCG/ACT nasal spray fluticasone propionate 50 mcg/actuation nasal spray,suspension    [provider]  glipiZIDE (GLUCOTROL XL) 5 MG 24 hr tablet Take 1 tablet (5 mg total) by mouth daily with breakfast. 08/24/17   Pleas [name], NP  glucose blood (ACCU-CHEK AVIVA PLUS) test strip Use to test blood sugar up to 2 times daily 02/09/17   Pleas [name], NP  ibandronate (BONIVA) 150 MG tablet Take one tablet by mouth once monthly.Take in morning with water, on an empty stomach, without medications, food, or lying down for 30 min. 06/19/17   Pleas [name], NP  ipratropium (ATROVENT HFA) 17 MCG/ACT inhaler Inhale 2 puffs into the lungs every 4 (four) hours as needed for wheezing. Wheezing and tight chest 06/20/17   Pleas [name], NP  JANUMET 50-1000 MG tablet TAKE 1 TABLET BY MOUTH TWICE DAILY WITH A MEAL. 05/18/17   Pleas [name], NP  levothyroxine (SYNTHROID, LEVOTHROID) 88 MCG tablet TAKE 1 TABLET BY MOUTH EVERY DAY BEFORE BREAKFAST 11/18/16   Pleas [name], NP  Magnesium 500 MG CAPS Take 1 capsule by mouth 2 (two) times daily.    [provider]  MAGNESIUM CITRATE PO magnesium  bid    [provider]  metoprolol tartrate (LOPRESSOR) 25 MG tablet Take 1 tablet (25 mg total) by mouth 2 (two) times daily. Patient taking differently: Take 25  mg by mouth as needed.  10/28/16   Minna Merritts, MD  metroNIDAZOLE (METROGEL) 1 % gel metronidazole 1 % topical gel    [provider]  Multiple Vitamins-Minerals (MULTIVITAMIN ADULT PO) Take 1 capsule by mouth 2 (two) times daily.     [provider]  predniSONE (DELTASONE) 10 MG tablet  08/16/17   [provider]  propafenone (RYTHMOL SR) 225 MG 12 hr capsule Take 1 capsule (225 mg total) by mouth 2 (two) times daily. 03/28/17   Minna Merritts, MD  quinapril (ACCUPRIL) 20 MG tablet TAKE 1 TABLET BY MOUTH AT BEDTIME. GENERIC EQUIVALENT FOR ACCUPRIL. 02/13/17   Minna Merritts, MD  ranitidine (ZANTAC) 150 MG tablet Take 1 tablet (150 mg total) by mouth 2 (two) times daily. 06/02/17   Pleas [name], NP  rosuvastatin (CRESTOR) 5 MG tablet Take 1 tablet (5 mg total) by mouth daily. 06/02/17 08/31/17  Minna Merritts, MD  venlafaxine XR (EFFEXOR-XR) 37.5 MG 24 hr capsule TAKE ONE CAPSULE BY MOUTH DAILY WITH BREAKFAST 06/02/17   Pleas [name], NP  verapamil (CALAN-SR) 120 MG CR tablet Take 1 tablet (120 mg total) by mouth at bedtime. 03/03/17   Minna Merritts, MD    Family History  Problem Relation Age of Onset  . Depression Mother   . Sudden death Mother 29  . Heart attack Mother   . Arthritis Father   . Heart disease Father   . Hypertension Father   . Diabetes Sister   . Diabetes Brother   . Diabetes Maternal Grandfather   . Breast cancer Maternal Grandmother   . Cancer Paternal Grandmother        Colon cancer  . Cancer Paternal Grandfather        colon cancer     Social History   Tobacco Use  . Smoking status: Former Smoker    Packs/day: 1.00    Years: 20.00    Pack years: 20.00    Types: Cigarettes    Last attempt to quit: 03/07/2005    Years since quitting: 12.4  . Smokeless tobacco: Never Used  Substance Use Topics  . Alcohol use: No    Alcohol/week: 0.0 oz  . Drug use: No    Allergies as of 08/30/2017  . (No Known Allergies)     Review of Systems:    All systems reviewed and negative except where noted in HPI.   Physical Exam:  BP 136/78   Pulse 84   Ht 5\' 5"  (1.651 m)   Wt 176 lb (79.8 kg)   BMI 29.29 kg/m  No LMP recorded. Patient has had a hysterectomy. Psych:  Alert and cooperative. Normal mood and affect. General:   Alert,  Well-developed, well-nourished, pleasant and cooperative in NAD Head:  Normocephalic and atraumatic. Eyes:  Sclera clear, no icterus.   Conjunctiva pink. Ears:  Normal auditory acuity. Nose:  No deformity, discharge, or lesions. Mouth:  No deformity or lesions,oropharynx pink & moist. Neck:  Supple; no masses or thyromegaly. Lungs:  Respirations even and unlabored.  Clear throughout to auscultation.   No wheezes, crackles, or rhonchi. No acute distress. Heart:  Regular rate and rhythm; no murmurs, clicks, rubs, or gallops. Abdomen:  Normal bowel sounds.  No bruits.  Soft, non-tender and non-distended without masses, hepatosplenomegaly or hernias noted.  No guarding or rebound tenderness.    Neurologic:  Alert and oriented x3;  grossly normal neurologically. Skin:  Intact without significant lesions or rashes. No jaundice. Lymph Nodes:  No significant cervical adenopathy. Psych:  Alert and cooperative. Normal mood and affect.  Imaging Studies: US Pelvic Complete With Transvaginal  Result Date: 08/29/2017 CLINICAL DATA:  Six months of pelvic pain.  History of hysterectomy. EXAM: TRANSABDOMINAL AND TRANSVAGINAL ULTRASOUND OF PELVIS TECHNIQUE: Both transabdominal and transvaginal ultrasound examinations of the pelvis were  performed. Transabdominal technique was performed for global imaging of the pelvis including uterus, ovaries, adnexal regions, and pelvic cul-de-sac. It was necessary to proceed with endovaginal exam following the transabdominal exam to visualize the ovaries. COMPARISON:  None FINDINGS: Uterus The uterus is surgically absent. Right ovary Measurements: 3.2 x 1.4 x 1.0  cm.  No adnexal masses are observed. Left ovary Measurements: 2.5 x 1.2 x 0.9 cm.  No adnexal masses are observed. Other findings There is no free pelvic fluid. IMPRESSION: Normal appearance of the ovaries. No adnexal masses or free pelvic fluid. The uterus is surgically absent. Given the patient's persistent symptoms, abdominal and pelvic CT scanning may be the most useful next imaging step. Electronically Signed   By: David  Martinique M.D.   On: 08/29/2017 16:27    Assessment and Plan:   Teyonna Plaisted is a 62 y.o. y/o female has been referred for  GERD and a colonoscopy. Review of Labs in 11/2016 show that she has a long standing microcytic anemia(family history of thalassemia).She says she has a history of Barrettes esophagus and hiatal hernia .  Personal history of colon polyps.   Plan  1. Iron studies , b12,folate,TSH,Hb electrophoresis.  2. EGD+ colonoscopy+/- capsule study of the small bowel (if has iron deficiency ) 3. GERD : Counseled on life style changes, suggest to use PPI first thing in the morning on empty stomach and eat 30 minutes after. Advised on the use of a wedge pillow at night , avoid meals for 2 hours prior to bed time. Weight loss .Discussed the risks and benefits of long term PPI use including but not limited to bone loss, chronic kidney disease, infections , low magnesium . Aim to use at the lowest dose for the shortest period of time  4. Eloquis holding instructions  I have discussed alternative options, risks & benefits,  which include, but are not limited to, bleeding, infection, perforation,respiratory complication & drug reaction.  The patient agrees with this plan & written consent will be obtained.   Follow up in 3 months   Dr Jonathon Bellows MD,MRCP(U.K)

## 2017-08-30 NOTE — Telephone Encounter (Signed)
   [facility] Pre-operative Risk Assessment    Request for surgical clearance:  1. What type of surgery is being performed? Upper & Lower Endoscopy  2. When is this surgery scheduled? 10/05/2017  3. What type of clearance is required (medical clearance vs. Pharmacy clearance to hold med vs. Both)? Pharmacy  4. Are there any medications that need to be held prior to surgery and how long? Eliquis   5. Practice name and name of physician performing surgery? [facility] ,Dr. Vicente Males  6. What is your office phone number [phone]   7.   What is your office fax number [phone]  8.   Anesthesia type (None, local, MAC, general) ? Not listed   Lucienne Minks 08/30/2017, 11:06 AM  _________________________________________________________________   (provider comments below)

## 2017-08-30 NOTE — Telephone Encounter (Signed)
Patient's procedure is 8/1.  Current appointment with Dr Rockey Situ is after that date. Can we please call the patient to schedule her to see Dr Rockey Situ in July sometimes at next available?

## 2017-08-30 NOTE — Telephone Encounter (Signed)
Lmov for patient to call back and schedule sooner appointment

## 2017-08-30 NOTE — Patient Instructions (Signed)
Gastroesophageal Reflux Disease, Adult Normally, food travels down the esophagus and stays in the stomach to be digested. However, when a person has gastroesophageal reflux disease (GERD), food and stomach acid move back up into the esophagus. When this happens, the esophagus becomes sore and inflamed. Over time, GERD can create small holes (ulcers) in the lining of the esophagus. What are the causes? This condition is caused by a problem with the muscle between the esophagus and the stomach (lower esophageal sphincter, or LES). Normally, the LES muscle closes after food passes through the esophagus to the stomach. When the LES is weakened or abnormal, it does not close properly, and that allows food and stomach acid to go back up into the esophagus. The LES can be weakened by certain dietary substances, medicines, and medical conditions, including:  Tobacco use.  Pregnancy.  Having a hiatal hernia.  Heavy alcohol use.  Certain foods and beverages, such as coffee, chocolate, onions, and peppermint.  What increases the risk? This condition is more likely to develop in:  People who have an increased body weight.  People who have connective tissue disorders.  People who use NSAID medicines.  What are the signs or symptoms? Symptoms of this condition include:  Heartburn.  Difficult or painful swallowing.  The feeling of having a lump in the throat.  Abitter taste in the mouth.  Bad breath.  Having a large amount of saliva.  Having an upset or bloated stomach.  Belching.  Chest pain.  Shortness of breath or wheezing.  Ongoing (chronic) cough or a night-time cough.  Wearing away of tooth enamel.  Weight loss.  Different conditions can cause chest pain. Make sure to see your health care provider if you experience chest pain. How is this diagnosed? Your health care provider will take a medical history and perform a physical exam. To determine if you have mild or severe  GERD, your health care provider may also monitor how you respond to treatment. You may also have other tests, including:  An endoscopy toexamine your stomach and esophagus with a small camera.  A test thatmeasures the acidity level in your esophagus.  A test thatmeasures how much pressure is on your esophagus.  A barium swallow or modified barium swallow to show the shape, size, and functioning of your esophagus.  How is this treated? The goal of treatment is to help relieve your symptoms and to prevent complications. Treatment for this condition may vary depending on how severe your symptoms are. Your health care provider may recommend:  Changes to your diet.  Medicine.  Surgery.  Follow these instructions at home: Diet  Follow a diet as recommended by your health care provider. This may involve avoiding foods and drinks such as: ? Coffee and tea (with or without caffeine). ? Drinks that containalcohol. ? Energy drinks and sports drinks. ? Carbonated drinks or sodas. ? Chocolate and cocoa. ? Peppermint and mint flavorings. ? Garlic and onions. ? Horseradish. ? Spicy and acidic foods, including peppers, chili powder, curry powder, vinegar, hot sauces, and barbecue sauce. ? Citrus fruit juices and citrus fruits, such as oranges, lemons, and limes. ? Tomato-based foods, such as red sauce, chili, salsa, and pizza with red sauce. ? Fried and fatty foods, such as donuts, french fries, potato chips, and high-fat dressings. ? High-fat meats, such as hot dogs and fatty cuts of red and white meats, such as rib eye steak, sausage, ham, and bacon. ? High-fat dairy items, such as whole milk,   butter, and cream cheese.  Eat small, frequent meals instead of large meals.  Avoid drinking large amounts of liquid with your meals.  Avoid eating meals during the 2-3 hours before bedtime.  Avoid lying down right after you eat.  Do not exercise right after you eat. General  instructions  Pay attention to any changes in your symptoms.  Take over-the-counter and prescription medicines only as told by your health care provider. Do not take aspirin, ibuprofen, or other NSAIDs unless your health care provider told you to do so.  Do not use any tobacco products, including cigarettes, chewing tobacco, and e-cigarettes. If you need help quitting, ask your health care provider.  Wear loose-fitting clothing. Do not wear anything tight around your waist that causes pressure on your abdomen.  Raise (elevate) the head of your bed 6 inches (15cm).  Try to reduce your stress, such as with yoga or meditation. If you need help reducing stress, ask your health care provider.  If you are overweight, reduce your weight to an amount that is healthy for you. Ask your health care provider for guidance about a safe weight loss goal.  Keep all follow-up visits as told by your health care provider. This is important. Contact a health care provider if:  You have new symptoms.  You have unexplained weight loss.  You have difficulty swallowing, or it hurts to swallow.  You have wheezing or a persistent cough.  Your symptoms do not improve with treatment.  You have a hoarse voice. Get help right away if:  You have pain in your arms, neck, jaw, teeth, or back.  You feel sweaty, dizzy, or light-headed.  You have chest pain or shortness of breath.  You vomit and your vomit looks like blood or coffee grounds.  You faint.  Your stool is bloody or black.  You cannot swallow, drink, or eat. This information is not intended to replace advice given to you by your health care provider. Make sure you discuss any questions you have with your health care provider. Document Released: 12/01/2004 Document Revised: 07/22/2015 Document Reviewed: 06/18/2014 Elsevier Interactive Patient Education  2018 [employer]vier Inc[employer]  

## 2017-08-31 ENCOUNTER — Other Ambulatory Visit: Payer: Self-pay | Admitting: Primary Care

## 2017-09-02 ENCOUNTER — Other Ambulatory Visit: Payer: Self-pay | Admitting: Primary Care

## 2017-09-02 DIAGNOSIS — K219 Gastro-esophageal reflux disease without esophagitis: Secondary | ICD-10-CM

## 2017-09-02 DIAGNOSIS — R232 Flushing: Secondary | ICD-10-CM

## 2017-09-04 ENCOUNTER — Encounter: Payer: Self-pay | Admitting: Primary Care

## 2017-09-04 MED ORDER — RANITIDINE HCL 150 MG PO TABS: 150.0000 mg | ORAL_TABLET | Freq: Two times a day (BID) | ORAL | 2 refills | Status: DC

## 2017-09-04 NOTE — Telephone Encounter (Signed)
Pt rescheduled to July 24th to see Dr Rockey Situ

## 2017-09-04 NOTE — Telephone Encounter (Signed)
Noted  

## 2017-09-06 ENCOUNTER — Other Ambulatory Visit: Payer: Self-pay

## 2017-09-06 ENCOUNTER — Telehealth: Payer: Self-pay

## 2017-09-06 DIAGNOSIS — D509 Iron deficiency anemia, unspecified: Secondary | ICD-10-CM

## 2017-09-06 LAB — IRON,TIBC AND FERRITIN PANEL
FERRITIN: 7 ng/mL — AB (ref 15–150)
IRON SATURATION: 9 % — AB (ref 15–55)
IRON: 35 ug/dL (ref 27–159)
Total Iron Binding Capacity: 406 ug/dL (ref 250–450)
UIBC: 371 ug/dL (ref 131–425)

## 2017-09-06 LAB — URINALYSIS, ROUTINE W REFLEX MICROSCOPIC
Bilirubin, UA: NEGATIVE
Glucose, UA: NEGATIVE
Ketones, UA: NEGATIVE
Leukocytes, UA: NEGATIVE
Nitrite, UA: NEGATIVE
PH UA: 7 (ref 5.0–7.5)
PROTEIN UA: NEGATIVE
RBC, UA: NEGATIVE
Specific Gravity, UA: 1.007 (ref 1.005–1.030)
Urobilinogen, Ur: 0.2 mg/dL (ref 0.2–1.0)

## 2017-09-06 LAB — HEMOGLOBINOPATHY EVALUATION
HEMOGLOBIN A2 QUANTITATION: 1.7 % — AB (ref 1.8–3.2)
HGB C: 0 %
HGB S: 0 %
HGB VARIANT: 0 %
Hemoglobin F Quantitation: 0 % (ref 0.0–2.0)
Hgb A: 98.3 % (ref 96.4–98.8)

## 2017-09-06 LAB — B12 AND FOLATE PANEL
Folate: 19.4 ng/mL (ref >3.0)
VITAMIN B 12: 874 pg/mL (ref 232–1245)

## 2017-09-06 LAB — TSH: TSH: 0.437 u[IU]/mL — ABNORMAL LOW (ref 0.450–4.500)

## 2017-09-06 NOTE — Progress Notes (Signed)
amb  

## 2017-09-06 NOTE — Telephone Encounter (Signed)
Pt has been notified of lab results.   1. Referral sent to Dr. Tasia Catchings in Oncology to schedule IV iron infusions.   2. Messaged Alma Friendly, NP and advised her of TSH levels. Advised pt to contact her office to schedule a follow up appt to address.

## 2017-09-06 NOTE — Telephone Encounter (Signed)
-----   Message from Jonathon Bellows, MD sent at 09/06/2017  8:54 AM EDT ----- Sequoyah Ramone  1. Extremely low iron- suggest IV iron - refer to Dr Tasia Catchings in Oncology - appears that she has been iron deficient for a while 2. TSH is very low would need to be addressed by Pleas [name], NP   C/c Dr Tasia Catchings and Carlis Abbott Leticia Penna, NP   Dr Jonathon Bellows MD,MRCP [facility]) Gastroenterology/Hepatology Pager: [phone]

## 2017-09-11 ENCOUNTER — Encounter (INDEPENDENT_AMBULATORY_CARE_PROVIDER_SITE_OTHER): Payer: Self-pay

## 2017-09-12 ENCOUNTER — Other Ambulatory Visit: Payer: Self-pay | Admitting: Cardiovascular Disease

## 2017-09-12 ENCOUNTER — Ambulatory Visit: Payer: Managed Care, Other (non HMO) | Admitting: Podiatry

## 2017-09-12 NOTE — Telephone Encounter (Signed)
Please review for refill. Thanks!  

## 2017-09-12 NOTE — Telephone Encounter (Signed)
Pt last saw Dr Rockey Situ 10/28/16, has yearly f/u scheduled for 09/27/17, last labs 08/24/17 Creat 0.80, age 62, weight 79.8kg, based on specified criteria pt is on appropriate dosage of Eliquis 5mg  BID.  Will refill rx.

## 2017-09-19 ENCOUNTER — Telehealth: Payer: Self-pay

## 2017-09-19 NOTE — Telephone Encounter (Signed)
Patient contacted office to reschedule her colon and EGD from the 18th to 10/17/17 with Dr. Vicente Males.  Debra Perez has been informed of date change.  Thanks [employer]

## 2017-09-20 ENCOUNTER — Encounter: Payer: Managed Care, Other (non HMO) | Admitting: Oncology

## 2017-09-20 ENCOUNTER — Inpatient Hospital Stay: Payer: Managed Care, Other (non HMO) | Admitting: Oncology

## 2017-09-24 NOTE — Progress Notes (Signed)
Patient ID: Debra Perez, female   DOB: 01-01-1956, 62 y.o.   MRN: [id number] Cardiology Office Note  Date:  09/27/2017   ID:  Debra Perez, DOB 1955/09/01, MRN [id number]  PCP:  Pleas [name], NP   Chief Complaint  Patient presents with  . other    12 month follow up. Meds reviewed by the pt. verbally. Pt. c/o palpitations more often.     HPI:  Debra Perez is a pleasant 62 year old woman with history of  Diabetes II Paroxysmal atrial fibrillation, ChadsVASC 4 to 5 nonsustained VT/palpitations,  normal ejection fraction with EF greater than 60%,  mild three-vessel coronary artery disease on CTA scan,  20 years of smoking who stopped 13 years ago,  Upper respiratory infection February 2016  atrial fibrillation after inhaling albuterol while on prednisone,  evaluated in the hospital at that time with arrhythmia lasting for hours before converting back to normal sinus rhythm,  History of Obstructive sleep apnea, on CPAP Who presents for follow-up of her atrial fibrillation  In follow-up today she reports that she is doing well Tried various diabetes meds when she was living in [place],does not know the names Had various side effects and was unable to tolerate them Reports her hemoglobin A1c continues to run high, >8  Needs to do colonoscopy History of anemia and low iron Scheduled to have iron infusion  Needs foot surgery  Continues to take metoprolol only as needed for palpitations Reports that previously on metoprolol on a regular basis was having bradycardia, weight gain Continues on Rythmol Last episode of atrial fibrillation that she is aware of was 2016 after albuterol  Cramps on pravastatin No significant cramping on Crestor Total cholesterol well controlled   no regular exercise program Reports blood pressure well controlled  EKG personally reviewed by myself on todays visit showsNo sinus rhythm rate 88 bpm nonspecific ST abnormality right bundle  branch block  Other past medical history reviewed CT scan done in 2016 again reviewed with him showing mild coronary artery disease calcification   atrial fibrillation 01/16/2015 lasting at least 5 hours Possibly also 2 other very short episodes mention on her last clinic visit  metoprolol held for fatigue  multaq held for hair loss  started Rythmol 225 mg twice a day, SR dose, and on this regiment has felt better   She moved to the area from [place] when her husband changed jobs. She likes the area and has not had any significant problems since her arrival. In [place], she developed a severe upper respiratory infection, failed outpatient ABX. At home she was using Solu-Medrol, albuterol. Shortly after using the albuterol, she developed tachycardia, lightheadedness and went to the emergency room, found to be in atrial fibrillation. She is given IV medication though she does not know the details. Converted back to normal sinus rhythm  Was previously on anticoagulation, pradaxa, later changed to high-dose aspirin. She has been maintained on multaq, previously on metoprolol which she liked better but was changed February 2016.  Echocardiogram February 2016 was essentially normal, ejection fraction 60-65% Cardiac CTA with mild three-vessel CAD, ectatic ascending aorta measuring 3.72 cm  Total cholesterol 154, LDL 83  PFTs with mild obstructive lung disease  previous EKG on showing normal sinus rhythm with rate 75 bpm, no significant ST or T-wave changes   PMH:   has a past medical history of Atrial fibrillation ([place]), Carotid artery occlusion, Diabetes mellitus without complication ([place]), GERD (gastroesophageal reflux disease), History of colonic polyps, Hyperlipidemia, Hypertension, Lung  infection, Mitral valve prolapse, Obstructive sleep apnea, Sleep apnea, Thyroid disease, and Ventricular tachycardia (paroxysmal) ([place]).  PSH:    Past Surgical History:  Procedure Laterality Date  .  ABDOMINAL HYSTERECTOMY  2002   ovaries remain  . CLAVICLE SURGERY  2007   right clavicle plate pins  . KNEE ARTHROSCOPY  2012    Current Outpatient Medications  Medication Sig Dispense Refill  . [name] 50 MG tablet TAKE 1 TABLET BY MOUTH AT BEDTIME 90 tablet 0  . apixaban (ELIQUIS) 5 MG TABS tablet Eliquis 5 mg tablet    . b complex vitamins capsule Take 1 capsule by mouth 2 (two) times daily.    . cyclobenzaprine (FLEXERIL) 5 MG tablet Take 1 tablet (5 mg total) by mouth at bedtime as needed for muscle spasms. 90 tablet 0  . diclofenac sodium (VOLTAREN) 1 % GEL Apply 2 g topically 4 (four) times daily. 100 g 0  . ELIQUIS 5 MG TABS tablet TAKE 1 TABLET BY MOUTH TWICE DAILY 180 tablet 1  . ezetimibe (ZETIA) 10 MG tablet TAKE 1 TABLET BY MOUTH DAILY GENERIC EQUIVALENT FOR ZETIA 90 tablet 2  . fluticasone (FLONASE) 50 MCG/ACT nasal spray fluticasone propionate 50 mcg/actuation nasal spray,suspension    . glipiZIDE (GLUCOTROL XL) 5 MG 24 hr tablet Take 1 tablet (5 mg total) by mouth daily with breakfast. 90 tablet 1  . glucose blood (ACCU-CHEK AVIVA PLUS) test strip Use to test blood sugar up to 2 times daily 300 each 3  . ibandronate (BONIVA) 150 MG tablet Take one tablet by mouth once monthly.Take in morning with water, on an empty stomach, without medications, food, or lying down for 30 min. 4 tablet 3  . ipratropium (ATROVENT HFA) 17 MCG/ACT inhaler Inhale 2 puffs into the lungs every 4 (four) hours as needed for wheezing. Wheezing and tight chest 1 Inhaler 0  . JANUMET 50-1000 MG tablet TAKE 1 TABLET BY MOUTH TWICE DAILY WITH A MEAL. 180 tablet 1  . levothyroxine (SYNTHROID, LEVOTHROID) 88 MCG tablet TAKE 1 TABLET BY MOUTH EVERY DAY BEFORE BREAKFAST 90 tablet 3  . Magnesium 500 MG CAPS Take 1 capsule by mouth 2 (two) times daily.    . metoprolol tartrate (LOPRESSOR) 25 MG tablet Take 1 tablet (25 mg total) by mouth 2 (two) times daily. (Patient taking differently: Take 25 mg  by mouth as needed. ) 180 tablet 3  . metroNIDAZOLE (METROGEL) 1 % gel metronidazole 1 % topical gel    . Multiple Vitamins-Minerals (MULTIVITAMIN ADULT PO) Take 1 capsule by mouth 2 (two) times daily.     . predniSONE (DELTASONE) 10 MG tablet   0  . propafenone (RYTHMOL SR) 225 MG 12 hr capsule Take 1 capsule (225 mg total) by mouth 2 (two) times daily. 180 capsule 3  . quinapril (ACCUPRIL) 20 MG tablet TAKE 1 TABLET BY MOUTH AT BEDTIME. GENERIC EQUIVALENT FOR ACCUPRIL. 90 tablet 3  . ranitidine (ZANTAC) 150 MG tablet Take 1 tablet (150 mg total) by mouth 2 (two) times daily. 180 tablet 2  . rosuvastatin (CRESTOR) 5 MG tablet Take 1 tablet (5 mg total) by mouth daily. 90 tablet 2  . venlafaxine XR (EFFEXOR-XR) 37.5 MG 24 hr capsule TAKE ONE CAPSULE BY MOUTH DAILY WITH BREAKFAST 90 capsule 2  . verapamil (CALAN-SR) 120 MG CR tablet Take 1 tablet (120 mg total) by mouth at bedtime. 90 tablet 3   No current facility-administered medications for this visit.      Allergies:  Patient has no known allergies.   Social History:  The patient  reports that she quit smoking about 12 years ago. Her smoking use included cigarettes. She has a 20.00 pack-year smoking history. She has never used smokeless tobacco. She reports that she does not drink alcohol or use drugs.   Family History:   family history includes Arthritis in her father; Breast cancer in her maternal grandmother; Cancer in her paternal grandfather and paternal grandmother; Depression in her mother; Diabetes in her brother, maternal grandfather, and sister; Heart attack in her mother; Heart disease in her father; Hypertension in her father; Sudden death (age of onset: 11) in her mother.    Review of Systems: Review of Systems  Constitutional: Negative.   Respiratory: Negative.   Cardiovascular: Positive for palpitations.  Gastrointestinal: Negative.   Musculoskeletal: Negative.   Neurological: Negative.   Psychiatric/Behavioral:  Negative.   All other systems reviewed and are negative.    PHYSICAL EXAM: VS:  BP 140/72 (BP Location: Left Arm, Patient Position: Sitting, Cuff Size: Normal)   Pulse 88   Ht 5\' 6"  (1.676 m)   Wt 177 lb 8 oz (80.5 kg)   BMI 28.65 kg/m  , BMI Body mass index is 28.65 kg/m. Constitutional:  oriented to person, place, and time. No distress.  HENT:  Head: Normocephalic and atraumatic.  Eyes:  no discharge. No scleral icterus.  Neck: Normal range of motion. Neck supple. No JVD present.  Cardiovascular: Normal rate, regular rhythm, normal heart sounds and intact distal pulses. Exam reveals no gallop and no friction rub. No edema No murmur heard. Pulmonary/Chest: Effort normal and breath sounds normal. No stridor. No respiratory distress.  no wheezes.  no rales.  no tenderness.  Abdominal: Soft.  no distension.  no tenderness.  Musculoskeletal: Normal range of motion.  no  tenderness or deformity.  Neurological:  normal muscle tone. Coordination normal. No atrophy Skin: Skin is warm and dry. No rash noted. not diaphoretic.  Psychiatric:  normal mood and affect. behavior is normal. Thought content normal.    Recent Labs: 11/09/2016: Hemoglobin 10.3; Platelets 409.0 08/24/2017: ALT 27; BUN 15; Creatinine, Ser 0.80; Potassium 4.0; Sodium 139 09/04/2017: TSH 0.437    Lipid Panel Lab Results  Component Value Date   CHOL 123 08/24/2017   HDL 61.40 08/24/2017   LDLCALC 46 08/24/2017   TRIG 78.0 08/24/2017      Wt Readings from Last 3 Encounters:  09/27/17 177 lb 8 oz (80.5 kg)  08/30/17 176 lb (79.8 kg)  07/19/17 178 lb 8 oz (81 kg)     ASSESSMENT AND PLAN:   Paroxysmal atrial fibrillation (HCC) - Plan: EKG 12-Lead  tolerating Rythmol Took herself off metoprolol in the past secondary to weight gain Takes metoprolol only as needed Continue eliquis 5 mg twice a day  Preoperatively vascular Acceptable risk to hold anticoagulation 2-3 days prior to colonoscopy and foot  surgery No further testing needed  Essential hypertension -  Blood pressure is well controlled on today's visit. No changes made to the medications. We did discuss possibly starting metoprolol succinate 12.5 mg daily  NSVT (nonsustained ventricular tachycardia) (HCC) denies any symptoms concerning for nonsustained VT  Hyperlipidemia Coronary calcifications seen on previous CT scan chest Cholesterol at goal on her current medication regiment  Type 2 diabetes mellitus with other circulatory complication, without long-term current use of insulin ([place]) We have encouraged continued exercise, careful diet management in an effort to lose weight.recommended walking program  Shortness of breath  Recommended walking and weight loss Mild underlying lung disease, asthma  Disposition:   F/U  12 months   Total encounter time more than 25 minutes  Greater than 50% was spent in counseling and coordination of care with the patient     Orders Placed This Encounter  Procedures  . EKG 12-Lead     Signed, Esmond Plants, M.D., Ph.D. 09/27/2017  [place], [place]

## 2017-09-26 ENCOUNTER — Encounter: Payer: Managed Care, Other (non HMO) | Admitting: Oncology

## 2017-09-27 ENCOUNTER — Ambulatory Visit (INDEPENDENT_AMBULATORY_CARE_PROVIDER_SITE_OTHER): Payer: Managed Care, Other (non HMO) | Admitting: Cardiovascular Disease

## 2017-09-27 ENCOUNTER — Encounter: Payer: Self-pay | Admitting: Cardiovascular Disease

## 2017-09-27 VITALS — BP 140/72 | HR 88 | Ht 66.0 in | Wt 177.5 lb

## 2017-09-27 DIAGNOSIS — E1159 Type 2 diabetes mellitus with other circulatory complications: Secondary | ICD-10-CM

## 2017-09-27 DIAGNOSIS — I10 Essential (primary) hypertension: Secondary | ICD-10-CM

## 2017-09-27 DIAGNOSIS — E782 Mixed hyperlipidemia: Secondary | ICD-10-CM

## 2017-09-27 DIAGNOSIS — I480 Paroxysmal atrial fibrillation: Secondary | ICD-10-CM | POA: Diagnosis not present

## 2017-09-27 DIAGNOSIS — R0789 Other chest pain: Secondary | ICD-10-CM

## 2017-09-27 DIAGNOSIS — I4729 Other ventricular tachycardia: Secondary | ICD-10-CM

## 2017-09-27 DIAGNOSIS — I25119 Atherosclerotic heart disease of native coronary artery with unspecified angina pectoris: Secondary | ICD-10-CM

## 2017-09-27 DIAGNOSIS — R0602 Shortness of breath: Secondary | ICD-10-CM

## 2017-09-27 DIAGNOSIS — I472 Ventricular tachycardia: Secondary | ICD-10-CM

## 2017-09-27 NOTE — Patient Instructions (Addendum)
Medication Instructions:   Ok to hold eliquis 2 to 3 days before the colonoscopy  Take metoprolol the morning of the colo and morning of the foot surgery  If more palpitations, call the office We would start metoprolol succinate 12.5 mg once daily  Labwork:  No new labs needed  Testing/Procedures:  No further testing at this time   Follow-Up: It was a pleasure seeing you in the office today. Please call us if you have new issues that need to be addressed before your next appt.  [phone]  Your physician wants you to follow-up in: 12 months.  You will receive a reminder letter in the mail two months in advance. If you don't receive a letter, please call our office to schedule the follow-up appointment.  If you need a refill on your cardiac medications before your next appointment, please call your pharmacy.  For educational health videos Log in to : [email or web address] Or : [email or web address], password : [email or web address]

## 2017-10-02 ENCOUNTER — Encounter: Payer: Self-pay | Admitting: Primary Care

## 2017-10-02 ENCOUNTER — Other Ambulatory Visit: Payer: Self-pay | Admitting: Primary Care

## 2017-10-02 DIAGNOSIS — M5441 Lumbago with sciatica, right side: Secondary | ICD-10-CM

## 2017-10-02 DIAGNOSIS — M8580 Other specified disorders of bone density and structure, unspecified site: Secondary | ICD-10-CM

## 2017-10-03 ENCOUNTER — Ambulatory Visit (INDEPENDENT_AMBULATORY_CARE_PROVIDER_SITE_OTHER): Payer: Managed Care, Other (non HMO) | Admitting: Podiatry

## 2017-10-03 ENCOUNTER — Ambulatory Visit (INDEPENDENT_AMBULATORY_CARE_PROVIDER_SITE_OTHER): Payer: Managed Care, Other (non HMO)

## 2017-10-03 ENCOUNTER — Encounter: Payer: Self-pay | Admitting: Podiatry

## 2017-10-03 VITALS — BP 148/89 | HR 82

## 2017-10-03 DIAGNOSIS — M2041 Other hammer toe(s) (acquired), right foot: Secondary | ICD-10-CM

## 2017-10-03 DIAGNOSIS — M21611 Bunion of right foot: Secondary | ICD-10-CM | POA: Diagnosis not present

## 2017-10-03 DIAGNOSIS — M2042 Other hammer toe(s) (acquired), left foot: Secondary | ICD-10-CM

## 2017-10-03 NOTE — Patient Instructions (Signed)
Pre-Operative Instructions  Congratulations, you have decided to take an important step towards improving your quality of life.  You can be assured that the doctors and staff at [facility] will be with you every step of the way.  Here are some important things you should know:  1. Plan to be at the surgery center/hospital at least 1 (one) hour prior to your scheduled time, unless otherwise directed by the surgical center/hospital staff.  You must have a responsible adult accompany you, remain during the surgery and drive you home.  Make sure you have directions to the surgical center/hospital to ensure you arrive on time. 2. If you are having surgery at [facility] or [facility] hospitals, you will need a copy of your medical history and physical form from your family physician within one month prior to the date of surgery. We will give you a form for your primary physician to complete.  3. We make every effort to accommodate the date you request for surgery.  However, there are times where surgery dates or times have to be moved.  We will contact you as soon as possible if a change in schedule is required.   4. No aspirin/ibuprofen for one week before surgery.  If you are on aspirin, any non-steroidal anti-inflammatory medications (Mobic, Aleve, Ibuprofen) should not be taken seven (7) days prior to your surgery.  You make take Tylenol for pain prior to surgery.  5. Medications - If you are taking daily heart and blood pressure medications, seizure, reflux, allergy, asthma, anxiety, pain or diabetes medications, make sure you notify the surgery center/hospital before the day of surgery so they can tell you which medications you should take or avoid the day of surgery. 6. No food or drink after midnight the night before surgery unless directed otherwise by surgical center/hospital staff. 7. No alcoholic beverages 24-hours prior to surgery.  No smoking 24-hours prior or 24-hours after  surgery. 8. Wear loose pants or shorts. They should be loose enough to fit over bandages, boots, and casts. 9. Don't wear slip-on shoes. Sneakers are preferred. 10. Bring your boot with you to the surgery center/hospital.  Also bring crutches or a walker if your physician has prescribed it for you.  If you do not have this equipment, it will be provided for you after surgery. 11. If you have not been contacted by the surgery center/hospital by the day before your surgery, call to confirm the date and time of your surgery. 12. Leave-time from work may vary depending on the type of surgery you have.  Appropriate arrangements should be made prior to surgery with your employer. 13. Prescriptions will be provided immediately following surgery by your doctor.  Fill these as soon as possible after surgery and take the medication as directed. Pain medications will not be refilled on weekends and must be approved by the doctor. 14. Remove nail polish on the operative foot and avoid getting pedicures prior to surgery. 15. Wash the night before surgery.  The night before surgery wash the foot and leg well with water and the antibacterial soap provided. Be sure to pay special attention to beneath the toenails and in between the toes.  Wash for at least three (3) minutes. Rinse thoroughly with water and dry well with a towel.  Perform this wash unless told not to do so by your physician.  Enclosed: 1 Ice pack (please put in freezer the night before surgery)   1 Hibiclens skin cleaner     Pre-op instructions  If you have any questions regarding the instructions, please do not hesitate to call our office.  [place]: [address], [place], [place] [address] -- [phone]  [place]: [address] [place], [place] [address] -- [phone]  [place]: [address]  [place], [place] [address] -- [phone]  [place]: [address], [address], [place],  [address] -- [phone]  Website:  [email or web address] 

## 2017-10-03 NOTE — Telephone Encounter (Signed)
Will you please send her test strips to her mail order pharmacy? Testing 3 times daily.

## 2017-10-04 ENCOUNTER — Other Ambulatory Visit: Payer: Self-pay

## 2017-10-04 ENCOUNTER — Inpatient Hospital Stay: Payer: Managed Care, Other (non HMO) | Attending: Oncology | Admitting: Oncology

## 2017-10-04 ENCOUNTER — Telehealth: Payer: Self-pay | Admitting: *Deleted

## 2017-10-04 ENCOUNTER — Encounter: Payer: Self-pay | Admitting: Oncology

## 2017-10-04 VITALS — BP 146/81 | HR 77 | Temp 98.1°F | Resp 18 | Ht 66.5 in

## 2017-10-04 DIAGNOSIS — I4891 Unspecified atrial fibrillation: Secondary | ICD-10-CM | POA: Insufficient documentation

## 2017-10-04 DIAGNOSIS — D509 Iron deficiency anemia, unspecified: Secondary | ICD-10-CM | POA: Diagnosis present

## 2017-10-04 DIAGNOSIS — Z8052 Family history of malignant neoplasm of bladder: Secondary | ICD-10-CM | POA: Insufficient documentation

## 2017-10-04 DIAGNOSIS — K2270 Barrett's esophagus without dysplasia: Secondary | ICD-10-CM | POA: Diagnosis not present

## 2017-10-04 DIAGNOSIS — Z803 Family history of malignant neoplasm of breast: Secondary | ICD-10-CM | POA: Diagnosis not present

## 2017-10-04 DIAGNOSIS — Z800 Family history of malignant neoplasm of digestive organs: Secondary | ICD-10-CM | POA: Diagnosis not present

## 2017-10-04 DIAGNOSIS — Z87891 Personal history of nicotine dependence: Secondary | ICD-10-CM | POA: Insufficient documentation

## 2017-10-04 DIAGNOSIS — R5382 Chronic fatigue, unspecified: Secondary | ICD-10-CM | POA: Diagnosis not present

## 2017-10-04 DIAGNOSIS — Z7901 Long term (current) use of anticoagulants: Secondary | ICD-10-CM | POA: Diagnosis not present

## 2017-10-04 LAB — CBC AND DIFFERENTIAL
HCT: 39 (ref 36–46)
Hemoglobin: 12.8 (ref 12.0–16.0)
Neutrophils Absolute: 6
Platelets: 365 (ref 150–399)
WBC: 9.3

## 2017-10-04 MED ORDER — GLUCOSE BLOOD VI STRP: ORAL_STRIP | 3 refills | Status: DC

## 2017-10-04 MED ORDER — ACCU-CHEK FASTCLIX LANCETS MISC: 3 refills | Status: DC

## 2017-10-04 NOTE — Telephone Encounter (Signed)
"  I want to schedule my surgery.  It's not going to be until October but my husband wanted me to go ahead and get a date."  Dr. Amalia Hailey can do any Thursday in October.  Do you have a date in mind?  "I'd like to do it October 17."  I'll get it scheduled.  Someone from the surgical center will call you with the arrival time a day or two prior to your surgery date.

## 2017-10-04 NOTE — Progress Notes (Signed)
Patient here for initial evaluation. Patient receiving steroid injections for back pain.

## 2017-10-04 NOTE — Progress Notes (Addendum)
Hematology/Oncology Consult note [facility] Telephone:([phone] [phone]   Patient Care Team: Pleas [name], NP as PCP - General (Nurse Practitioner) Minna Merritts, MD as Consulting Physician (Cardiology)  REFERRING PROVIDER: Napoleon Form CHIEF COMPLAINTS/REASON FOR VISIT:  Evaluation of iron deficiency  HISTORY OF PRESENTING ILLNESS:  Debra Perez is a  62 y.o.  female with PMH listed below who was referred to me for evaluation of iron deficiency.  Patient has a history of atrial fibrillation,  is on chronic anticoagulation with Eliquis 5 mg twice daily. Patient had iron panel done on 09/04/2017 at PCPs office.  Reviewed patient's labs.  Iron 35, TIBC 406, ferritin 7, iron saturation 9.  Patient reports history of iron deficiency in the past.  She used to take an oral iron supplements but had GI side effects.  Currently not on any oral iron supplementation.  History of iron deficiency: Yes Rectal bleeding: Denies Menstrual bleeding/ Vaginal bleeding : Denies Hematemesis or hemoptysis : denies Blood in urine : denies  Pica: Craving for ice chips Last endoscopy: Recent EGD was 3 years ago in [place].  She has history of Barrett's esophagitis hiatal hernia. Denies weight loss, fever, chills, fatigue, night sweats.   Fatigue: Chronic onset, perisistent, no aggravating or improving factors, no associated symptoms.   Family history positive for sister diagnosed with bladder cancer, maternal grandmother also had a history of bladder cancer.  Paternal grandmother had breast cancer.  Paternal grandmother father has colon cancer.  Review of Systems  Constitutional: Positive for malaise/fatigue. Negative for chills, fever and weight loss.  HENT: Negative for nosebleeds and sore throat.   Eyes: Negative for double vision, photophobia and redness.  Respiratory: Negative for cough, shortness of breath and wheezing.   Cardiovascular: Negative for chest  pain, palpitations and orthopnea.  Gastrointestinal: Positive for heartburn. Negative for abdominal pain, blood in stool, nausea and vomiting.  Genitourinary: Negative for dysuria.  Musculoskeletal: Negative for back pain, myalgias and neck pain.  Skin: Negative for itching and rash.  Neurological: Negative for dizziness, tingling and tremors.  Endo/Heme/Allergies: Negative for environmental allergies. Does not bruise/bleed easily.  Psychiatric/Behavioral: Negative for depression.    MEDICAL HISTORY:  Past Medical History:  Diagnosis Date  . Atrial fibrillation ([place])   . 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 Diabetes mellitus without complication ([place])   . GERD (gastroesophageal reflux disease)   . History of colonic polyps    benign  . Hyperlipidemia   . Hypertension   . Lung infection   . Mitral valve prolapse   . Obstructive sleep apnea    on CPAP  . Sleep apnea   . Thyroid disease   . Ventricular tachycardia (paroxysmal) ([place])     SURGICAL HISTORY: Past Surgical History:  Procedure Laterality Date  . ABDOMINAL HYSTERECTOMY  2002   ovaries remain  . CLAVICLE SURGERY  2007   right clavicle plate pins  . KNEE ARTHROSCOPY  2012    SOCIAL HISTORY: Social History   Socioeconomic History  . Marital status: Married    Spouse name: Not on file  . Number of children: Not on file  . Years of education: Not on file  . Highest education level: Not on file  Occupational History  . Occupation: retired  Scientific laboratory technician  . Financial resource strain: Not on file  . Food insecurity:    Worry: Not on file    Inability: Not on file  . Transportation needs:  Medical: Not on file    Non-medical: Not on file  Tobacco Use  . Smoking status: Former Smoker    Packs/day: 1.00    Years: 20.00    Pack years: 20.00    Types: Cigarettes    Last attempt to quit: 03/07/2004    Years since quitting: 13.5  . Smokeless tobacco: Never Used  Substance and Sexual Activity  .  Alcohol use: No    Alcohol/week: 0.0 oz  . Drug use: No  . Sexual activity: Not on file  Lifestyle  . Physical activity:    Days per week: Not on file    Minutes per session: Not on file  . Stress: Not on file  Relationships  . Social connections:    Talks on phone: Not on file    Gets together: Not on file    Attends religious service: Not on file    Active member of club or organization: Not on file    Attends meetings of clubs or organizations: Not on file    Relationship status: Not on file  . Intimate partner violence:    Fear of current or ex partner: Not on file    Emotionally abused: Not on file    Physically abused: Not on file    Forced sexual activity: Not on file  Other Topics Concern  . Not on file  Social History Narrative   Married.   1 daughter.   Moved here from [place] due to husbands occupation.   She is a Corporate treasurer.   Enjoys sewing, gardening.    FAMILY HISTORY: Family History  Problem Relation Age of Onset  . Depression Mother   . Sudden death Mother 65  . Heart attack Mother   . Arthritis Father   . Heart disease Father   . Hypertension Father   . Emphysema Father   . Bladder Cancer Sister   . Diabetes Sister        Type 2  . Diabetes Brother        type  . Hypertension Brother   . Emphysema Brother   . Heart disease Brother   . Diabetes Maternal Grandfather   . Bladder Cancer Maternal Grandmother   . Breast cancer Paternal Grandmother   . Cancer Paternal Grandfather        colon cancer  . Diabetes Paternal Grandfather     ALLERGIES:  has No Known Allergies.  MEDICATIONS:  Current Outpatient Medications  Medication Sig Dispense Refill  . ACCU-CHEK FASTCLIX LANCETS MISC Use as instructed to test blood sugar 2 times daily 300 each 3  . [name] 50 MG tablet TAKE 1 TABLET BY MOUTH AT BEDTIME 90 tablet 0  . apixaban (ELIQUIS) 5 MG TABS tablet Take one tablet twice a day    . b complex vitamins capsule Take 1 capsule by mouth  2 (two) times daily.    . cyclobenzaprine (FLEXERIL) 5 MG tablet TAKE 1 TABLET BY MOUTH AT BEDTIME AS NEEDED FOR MUSCLE SPASMS 90 tablet 0  . diclofenac sodium (VOLTAREN) 1 % GEL Apply 2 g topically 4 (four) times daily. (Patient taking differently: Apply 2 g topically 4 (four) times daily as needed. ) 100 g 0  . ELIQUIS 5 MG TABS tablet TAKE 1 TABLET BY MOUTH TWICE DAILY 180 tablet 1  . ezetimibe (ZETIA) 10 MG tablet TAKE 1 TABLET BY MOUTH DAILY GENERIC EQUIVALENT FOR ZETIA 90 tablet 2  . fluticasone (FLONASE) 50 MCG/ACT nasal spray fluticasone propionate 50 mcg/actuation  nasal spray,suspension PRN    . glipiZIDE (GLUCOTROL XL) 5 MG 24 hr tablet Take 1 tablet (5 mg total) by mouth daily with breakfast. 90 tablet 1  . glucose blood (ACCU-CHEK AVIVA PLUS) test strip Use to test blood sugar up to 2 times daily 300 each 3  . ibandronate (BONIVA) 150 MG tablet Take one tablet by mouth once monthly.Take in morning with water, on an empty stomach, without medications, food, or lying down for 30 min. 4 tablet 3  . ipratropium (ATROVENT HFA) 17 MCG/ACT inhaler Inhale 2 puffs into the lungs every 4 (four) hours as needed for wheezing. Wheezing and tight chest 1 Inhaler 0  . JANUMET 50-1000 MG tablet TAKE 1 TABLET BY MOUTH TWICE DAILY WITH A MEAL. 180 tablet 1  . levothyroxine (SYNTHROID, LEVOTHROID) 88 MCG tablet TAKE 1 TABLET BY MOUTH EVERY DAY BEFORE BREAKFAST 90 tablet 3  . Magnesium 500 MG CAPS Take 1 capsule by mouth 2 (two) times daily.    . metoprolol tartrate (LOPRESSOR) 25 MG tablet Take 1 tablet (25 mg total) by mouth 2 (two) times daily. (Patient taking differently: Take 25 mg by mouth as needed. ) 180 tablet 3  . metroNIDAZOLE (METROGEL) 1 % gel metronidazole 1 % topical gel    . Multiple Vitamins-Minerals (MULTIVITAMIN ADULT PO) Take 1 capsule by mouth 2 (two) times daily.     . predniSONE (DELTASONE) 10 MG tablet Take 10 mg by mouth as needed.   0  . propafenone (RYTHMOL SR) 225 MG 12 hr  capsule Take 1 capsule (225 mg total) by mouth 2 (two) times daily. 180 capsule 3  . quinapril (ACCUPRIL) 20 MG tablet TAKE 1 TABLET BY MOUTH AT BEDTIME. GENERIC EQUIVALENT FOR ACCUPRIL. 90 tablet 3  . ranitidine (ZANTAC) 150 MG tablet Take 1 tablet (150 mg total) by mouth 2 (two) times daily. 180 tablet 2  . rosuvastatin (CRESTOR) 10 MG tablet Take 10 mg by mouth daily.    Marland Kitchen venlafaxine XR (EFFEXOR-XR) 37.5 MG 24 hr capsule TAKE ONE CAPSULE BY MOUTH DAILY WITH BREAKFAST 90 capsule 2  . verapamil (CALAN-SR) 120 MG CR tablet Take 1 tablet (120 mg total) by mouth at bedtime. (Patient taking differently: Take 120 mg by mouth daily. ) 90 tablet 3  . rosuvastatin (CRESTOR) 5 MG tablet Take 1 tablet (5 mg total) by mouth daily. 90 tablet 2   No current facility-administered medications for this visit.      PHYSICAL EXAMINATION: ECOG PERFORMANCE STATUS: 1 - Symptomatic but completely ambulatory Vitals:   10/04/17 0928  BP: (!) 146/81  Pulse: 77  Resp: 18  Temp: 98.1 F (36.7 C)  SpO2: 94%   There were no vitals filed for this visit.  Physical Exam  Constitutional: She is oriented to person, place, and time. She appears well-developed and well-nourished. No distress.  HENT:  Head: Normocephalic and atraumatic.  Right Ear: External ear normal.  Left Ear: External ear normal.  Mouth/Throat: Oropharynx is clear and moist.  Eyes: Pupils are equal, round, and reactive to light. EOM are normal. No scleral icterus.  Neck: Normal range of motion. Neck supple.  Cardiovascular: Normal rate, regular rhythm and normal heart sounds.  Pulmonary/Chest: Effort normal. No respiratory distress. She has no wheezes.  Abdominal: Soft. Bowel sounds are normal. She exhibits no distension and no mass. There is no tenderness.  Musculoskeletal: Normal range of motion. She exhibits no edema or deformity.  Neurological: She is alert and oriented to person, place, and  time. No cranial nerve deficit. Coordination  normal.  Skin: Skin is warm and dry. No rash noted. No erythema.  Psychiatric: She has a normal mood and affect. Her behavior is normal. Thought content normal.     LABORATORY DATA:  I have reviewed the data as listed Lab Results  Component Value Date   WBC 9.2 11/09/2016   HGB 10.3 (L) 11/09/2016   HCT 33.3 (L) 11/09/2016   MCV 74.8 (L) 11/09/2016   PLT 409.0 (H) 11/09/2016   Recent Labs    11/09/16 1625 04/19/17 1212 08/24/17 0842  NA 139 139 139  K 4.2 4.2 4.0  CL 103 104 99  CO2 27 29 33*  GLUCOSE 110* 117* 140*  BUN 8 12 15   CREATININE 0.77 0.74 0.80  CALCIUM 9.3 9.0 9.4  PROT 7.0  --  7.2  ALBUMIN 4.1  --  4.3  AST 21  --  15  ALT 22  --  27  ALKPHOS 47  --  43  BILITOT 0.3  --  0.4   Iron/TIBC/Ferritin/ %Sat    Component Value Date/Time   IRON 35 09/04/2017 1416   TIBC 406 09/04/2017 1416   FERRITIN 7 (L) 09/04/2017 1416   IRONPCTSAT 9 (LL) 09/04/2017 1416        ASSESSMENT & PLAN:  1. Iron deficiency anemia, unspecified iron deficiency anemia type    Labs reviewed and discussed with patient.  Her iron panel is consistent with iron deficiency.  Noticed that she has not had a CBC recently.  We will proceed with a baseline CBC with differential.   Plan IV iron with Venofer 200mg  weekly x 4 doses. Allergy reactions/infusion reaction including anaphylactic reaction discussed with patient. Other side effects include but not limited to high blood pressure, skin rash, weight gain, leg swelling, etc. Patient voices understanding and willing to proceed.  Repeat CBC iron TIBC ferritin in 2 months prior to reassessment for need of additional IV iron. Recommend patient continue follow-up with gastroenterologist as a repeat EGD and colonoscopy.   Orders Placed This Encounter  Procedures  . CBC with Differential/Platelet    Standing Status:   Future    Standing Expiration Date:   10/05/2018    All questions were answered. The patient knows to call the clinic  with any problems questions or concerns.  Return of visit:  Thank you for this kind referral and the opportunity to participate in the care of this patient. A copy of today's note is routed to referring provider  Total face to face encounter time for this patient visit was 45 min. >50% of the time was  spent in counseling and coordination of care.    Earlie Server, MD, PhD Hematology Oncology [facility] [place]- [phone] 10/04/2017  Addendum, will obtain baseline CBC which was done at [facility] [[id number]] and faxed to [place].  Labs reviewed.  Hemoglobin 12.8, MCV 80, RDW 20.1, platelet count 3 65,000, WBC 9.3, normal differential.

## 2017-10-05 ENCOUNTER — Other Ambulatory Visit: Payer: Self-pay | Admitting: *Deleted

## 2017-10-06 ENCOUNTER — Inpatient Hospital Stay: Payer: Managed Care, Other (non HMO) | Attending: Oncology

## 2017-10-06 VITALS — BP 132/83 | HR 80 | Temp 98.1°F | Resp 18

## 2017-10-06 DIAGNOSIS — D509 Iron deficiency anemia, unspecified: Secondary | ICD-10-CM | POA: Diagnosis not present

## 2017-10-06 MED ORDER — IRON SUCROSE 20 MG/ML IV SOLN
200.0000 mg | Freq: Once | INTRAVENOUS | Status: AC
  Administered 2017-10-06: 200 mg via INTRAVENOUS
  Filled 2017-10-06: qty 10

## 2017-10-06 MED ORDER — HEPARIN SOD (PORK) LOCK FLUSH 100 UNIT/ML IV SOLN: 500.0000 [IU] | Freq: Once | INTRAVENOUS | Status: AC | PRN

## 2017-10-06 MED ORDER — SODIUM CHLORIDE 0.9 % IV SOLN
Freq: Once | INTRAVENOUS | Status: AC
  Administered 2017-10-06: 14:00:00 via INTRAVENOUS
  Filled 2017-10-06: qty 1000

## 2017-10-06 MED ORDER — SODIUM CHLORIDE 0.9% FLUSH
10.0000 mL | INTRAVENOUS | Status: AC | PRN
  Administered 2017-10-06: 10 mL
  Filled 2017-10-06: qty 10

## 2017-10-08 NOTE — Progress Notes (Signed)
   Subjective: 62 year old female presenting as a new patient with a chief complaint of a hammertoe of the 2nd digit of the right foot as well as a bunion of the right foot that became painful about three years ago. Walking and standing for long periods of time increases the pain. She has not had any treatment. Patient is here for further evaluation and treatment.    Past Medical History:  Diagnosis Date  . Atrial fibrillation ([place])   . 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 Diabetes mellitus without complication ([place])   . GERD (gastroesophageal reflux disease)   . History of colonic polyps    benign  . Hyperlipidemia   . Hypertension   . Lung infection   . Mitral valve prolapse   . Obstructive sleep apnea    on CPAP  . Sleep apnea   . Thyroid disease   . Ventricular tachycardia (paroxysmal) ([facility])       Objective: Physical Exam General: The patient is alert and oriented x3 in no acute distress.  Dermatology: Skin is cool, dry and supple bilateral lower extremities. Negative for open lesions or macerations.  Vascular: Palpable pedal pulses bilaterally. No edema or erythema noted. Capillary refill within normal limits.  Neurological: Epicritic and protective threshold grossly intact bilaterally.   Musculoskeletal Exam: Clinical evidence of bunion deformity noted to the respective foot. There is a moderate pain on palpation range of motion of the first MPJ. Lateral deviation of the hallux noted consistent with hallux abductovalgus. Hammertoe contracture also noted on clinical exam to the 2nd digit of the right foot. Symptomatic pain on palpation and range of motion also noted to the metatarsal phalangeal joints of the respective hammertoe digits.    Radiographic Exam: Increased intermetatarsal angle greater than 15 with a hallux abductus angle greater than 30 noted on AP view. Moderate degenerative changes noted within the first MPJ. Contracture deformity also noted to  the interphalangeal joints and MPJs of the digits of the respective hammertoes.    Assessment: 1. HAV w/ bunion deformity right 2. 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 Patient was evaluated. X-Rays reviewed. 2. Today we discussed the conservative versus surgical management of the presenting pathology. The patient opts for surgical management. All possible complications and details of the procedure were explained. All patient questions were answered. No guarantees were expressed or implied. 3. Authorization for surgery was initiated today. Surgery will consist of bunionectomy with metatarsal osteotomy right, PIPJ arthroplasty with MPJ capsulotomy right.  4. CAM boot dispensed.  5. Return to clinic one week post op.    Edrick Kins, DPM [facility]  Dr. Edrick Kins, [address]                                        [place], [place] [address]                Office [phone]  Fax [phone]

## 2017-10-10 ENCOUNTER — Encounter: Payer: Self-pay | Admitting: Cardiovascular Disease

## 2017-10-13 ENCOUNTER — Inpatient Hospital Stay: Payer: Managed Care, Other (non HMO)

## 2017-10-13 VITALS — BP 135/80 | HR 81 | Temp 98.2°F | Resp 18

## 2017-10-13 DIAGNOSIS — D509 Iron deficiency anemia, unspecified: Secondary | ICD-10-CM | POA: Diagnosis not present

## 2017-10-13 MED ORDER — IRON SUCROSE 20 MG/ML IV SOLN
200.0000 mg | Freq: Once | INTRAVENOUS | Status: AC
  Administered 2017-10-13: 200 mg via INTRAVENOUS
  Filled 2017-10-13: qty 10

## 2017-10-13 MED ORDER — SODIUM CHLORIDE 0.9 % IV SOLN
Freq: Once | INTRAVENOUS | Status: AC
  Administered 2017-10-13: 14:00:00 via INTRAVENOUS
  Filled 2017-10-13: qty 1000

## 2017-10-17 ENCOUNTER — Ambulatory Visit: Payer: Managed Care, Other (non HMO) | Admitting: Anesthesiology

## 2017-10-17 ENCOUNTER — Ambulatory Visit: Payer: Managed Care, Other (non HMO) | Admitting: Cardiovascular Disease

## 2017-10-17 ENCOUNTER — Telehealth: Payer: Self-pay | Admitting: Cardiovascular Disease

## 2017-10-17 ENCOUNTER — Other Ambulatory Visit: Payer: Self-pay

## 2017-10-17 ENCOUNTER — Other Ambulatory Visit: Payer: Self-pay | Admitting: Primary Care

## 2017-10-17 ENCOUNTER — Encounter
Admission: RE | Disposition: A | Discharge status: Home / Self Care | Payer: Self-pay | Source: Ambulatory Visit | Attending: Gastroenterology

## 2017-10-17 ENCOUNTER — Ambulatory Visit
Admission: RE | Admit: 2017-10-17 | Discharge: 2017-10-17 | Disposition: A | Discharge status: Home / Self Care | Payer: Managed Care, Other (non HMO) | Source: Ambulatory Visit | Attending: Gastroenterology | Admitting: Gastroenterology

## 2017-10-17 ENCOUNTER — Encounter: Payer: Self-pay | Admitting: Anesthesiology

## 2017-10-17 DIAGNOSIS — G4733 Obstructive sleep apnea (adult) (pediatric): Secondary | ICD-10-CM | POA: Diagnosis not present

## 2017-10-17 DIAGNOSIS — K219 Gastro-esophageal reflux disease without esophagitis: Secondary | ICD-10-CM | POA: Insufficient documentation

## 2017-10-17 DIAGNOSIS — I472 Ventricular tachycardia: Secondary | ICD-10-CM | POA: Insufficient documentation

## 2017-10-17 DIAGNOSIS — D509 Iron deficiency anemia, unspecified: Secondary | ICD-10-CM | POA: Insufficient documentation

## 2017-10-17 DIAGNOSIS — I4891 Unspecified atrial fibrillation: Secondary | ICD-10-CM | POA: Insufficient documentation

## 2017-10-17 DIAGNOSIS — K319 Disease of stomach and duodenum, unspecified: Secondary | ICD-10-CM | POA: Insufficient documentation

## 2017-10-17 DIAGNOSIS — Z8249 Family history of ischemic heart disease and other diseases of the circulatory system: Secondary | ICD-10-CM | POA: Insufficient documentation

## 2017-10-17 DIAGNOSIS — I2510 Atherosclerotic heart disease of native coronary artery without angina pectoris: Secondary | ICD-10-CM | POA: Insufficient documentation

## 2017-10-17 DIAGNOSIS — E119 Type 2 diabetes mellitus without complications: Secondary | ICD-10-CM | POA: Insufficient documentation

## 2017-10-17 DIAGNOSIS — Z7984 Long term (current) use of oral hypoglycemic drugs: Secondary | ICD-10-CM | POA: Diagnosis not present

## 2017-10-17 DIAGNOSIS — Z79899 Other long term (current) drug therapy: Secondary | ICD-10-CM | POA: Diagnosis not present

## 2017-10-17 DIAGNOSIS — Z86010 Personal history of colonic polyps: Secondary | ICD-10-CM | POA: Diagnosis not present

## 2017-10-17 DIAGNOSIS — Z79890 Hormone replacement therapy (postmenopausal): Secondary | ICD-10-CM | POA: Diagnosis not present

## 2017-10-17 DIAGNOSIS — Z7952 Long term (current) use of systemic steroids: Secondary | ICD-10-CM | POA: Diagnosis not present

## 2017-10-17 DIAGNOSIS — D122 Benign neoplasm of ascending colon: Secondary | ICD-10-CM | POA: Insufficient documentation

## 2017-10-17 DIAGNOSIS — E785 Hyperlipidemia, unspecified: Secondary | ICD-10-CM | POA: Insufficient documentation

## 2017-10-17 DIAGNOSIS — Z87891 Personal history of nicotine dependence: Secondary | ICD-10-CM | POA: Insufficient documentation

## 2017-10-17 DIAGNOSIS — Z7901 Long term (current) use of anticoagulants: Secondary | ICD-10-CM | POA: Insufficient documentation

## 2017-10-17 DIAGNOSIS — K5730 Diverticulosis of large intestine without perforation or abscess without bleeding: Secondary | ICD-10-CM | POA: Diagnosis not present

## 2017-10-17 DIAGNOSIS — K640 First degree hemorrhoids: Secondary | ICD-10-CM | POA: Diagnosis not present

## 2017-10-17 DIAGNOSIS — Z7951 Long term (current) use of inhaled steroids: Secondary | ICD-10-CM | POA: Diagnosis not present

## 2017-10-17 DIAGNOSIS — D120 Benign neoplasm of cecum: Secondary | ICD-10-CM | POA: Diagnosis not present

## 2017-10-17 DIAGNOSIS — Z791 Long term (current) use of non-steroidal anti-inflammatories (NSAID): Secondary | ICD-10-CM | POA: Diagnosis not present

## 2017-10-17 DIAGNOSIS — I10 Essential (primary) hypertension: Secondary | ICD-10-CM | POA: Diagnosis not present

## 2017-10-17 DIAGNOSIS — E039 Hypothyroidism, unspecified: Secondary | ICD-10-CM | POA: Insufficient documentation

## 2017-10-17 DIAGNOSIS — K2970 Gastritis, unspecified, without bleeding: Secondary | ICD-10-CM

## 2017-10-17 HISTORY — PX: COLONOSCOPY WITH PROPOFOL: SHX5780

## 2017-10-17 HISTORY — PX: ESOPHAGOGASTRODUODENOSCOPY (EGD) WITH PROPOFOL: SHX5813

## 2017-10-17 HISTORY — DX: Hypothyroidism, unspecified: E03.9

## 2017-10-17 LAB — GLUCOSE, CAPILLARY: GLUCOSE-CAPILLARY: 162 mg/dL — AB (ref 70–99)

## 2017-10-17 SURGERY — ESOPHAGOGASTRODUODENOSCOPY (EGD) WITH PROPOFOL
Anesthesia: General

## 2017-10-17 MED ORDER — MIDAZOLAM HCL 2 MG/2ML IJ SOLN
INTRAMUSCULAR | Status: DC | PRN
  Administered 2017-10-17: 2 mg via INTRAVENOUS

## 2017-10-17 MED ORDER — FENTANYL CITRATE (PF) 100 MCG/2ML IJ SOLN
INTRAMUSCULAR | Status: DC | PRN
  Administered 2017-10-17: 50 ug via INTRAVENOUS

## 2017-10-17 MED ORDER — LIDOCAINE HCL (PF) 2 % IJ SOLN
INTRAMUSCULAR | Status: AC
  Filled 2017-10-17: qty 10

## 2017-10-17 MED ORDER — BUTAMBEN-TETRACAINE-BENZOCAINE 2-2-14 % EX AERO
INHALATION_SPRAY | CUTANEOUS | Status: AC
  Filled 2017-10-17: qty 5

## 2017-10-17 MED ORDER — PROPOFOL 500 MG/50ML IV EMUL
INTRAVENOUS | Status: DC | PRN
  Administered 2017-10-17: 100 ug/kg/min via INTRAVENOUS

## 2017-10-17 MED ORDER — SODIUM CHLORIDE 0.9 % IV SOLN
INTRAVENOUS | Status: DC
  Administered 2017-10-17: 08:00:00 via INTRAVENOUS

## 2017-10-17 MED ORDER — MIDAZOLAM HCL 2 MG/2ML IJ SOLN
INTRAMUSCULAR | Status: AC
  Filled 2017-10-17: qty 2

## 2017-10-17 MED ORDER — FENTANYL CITRATE (PF) 100 MCG/2ML IJ SOLN
INTRAMUSCULAR | Status: AC
  Filled 2017-10-17: qty 2

## 2017-10-17 MED ORDER — PROPOFOL 500 MG/50ML IV EMUL
INTRAVENOUS | Status: AC
  Filled 2017-10-17: qty 50

## 2017-10-17 MED ORDER — GLUCOSE BLOOD VI STRP: ORAL_STRIP | 1 refills | Status: DC

## 2017-10-17 MED ORDER — PROPOFOL 500 MG/50ML IV EMUL
INTRAVENOUS | Status: AC
Start: 2017-10-17 — End: ?
  Filled 2017-10-17: qty 50

## 2017-10-17 NOTE — Op Note (Signed)
[facility] Gastroenterology Patient Name: Debra Perez Procedure Date: 10/17/2017 8:36 AM MRN: [id number] Account #: [id number] Date of Birth: July 04, 1955 Admit Type: Outpatient Age: 62 Room: [facility] ENDO ROOM 1 Gender: Female Note Status: Finalized Procedure:            Colonoscopy Indications:          Iron deficiency anemia Providers:            Jonathon Bellows MD, MD Medicines:            Monitored Anesthesia Care Complications:        No immediate complications. Procedure:            Pre-Anesthesia Assessment:                       - Prior to the procedure, a History and Physical was                        performed, and patient medications, allergies and                        sensitivities were reviewed. The patient's tolerance of                        previous anesthesia was reviewed.                       - The risks and benefits of the procedure and the                        sedation options and risks were discussed with the                        patient. All questions were answered and informed                        consent was obtained.                       - ASA Grade Assessment: II - A patient with mild                        systemic disease.                       After obtaining informed consent, the colonoscope was                        passed under direct vision. Throughout the procedure,                        the patient's blood pressure, pulse, and oxygen                        saturations were monitored continuously. The                        Colonoscope was introduced through the anus and                        advanced to the the cecum, identified by the  appendiceal orifice, IC valve and transillumination.                        The colonoscopy was performed with ease. The patient                        tolerated the procedure well. The quality of the bowel                        preparation was good. Findings:     The perianal and digital rectal examinations were normal.      A 3 mm polyp was found in the cecum. The polyp was sessile. The polyp       was removed with a cold biopsy forceps. Resection and retrieval were       complete.      A 6 mm polyp was found in the proximal ascending colon. The polyp was       sessile. The polyp was removed with a cold snare. Resection and       retrieval were complete.      Multiple small-mouthed diverticula were found in the sigmoid colon.      Non-bleeding internal hemorrhoids were found during retroflexion. The       hemorrhoids were medium-sized and Grade I (internal hemorrhoids that do       not prolapse).      The exam was otherwise without abnormality. Impression:           - One 3 mm polyp in the cecum, removed with a cold                        biopsy forceps. Resected and retrieved.                       - One 6 mm polyp in the proximal ascending colon,                        removed with a cold snare. Resected and retrieved.                       - Diverticulosis in the sigmoid colon.                       - Non-bleeding internal hemorrhoids.                       - The examination was otherwise normal. Recommendation:       - Discharge patient to home (with escort).                       - Resume previous diet.                       - Continue present medications.                       - Await pathology results.                       - Repeat colonoscopy in 5 years for surveillance based  on pathology results.                       - To visualize the small bowel, perform video capsule                        endoscopy in 2 weeks. Procedure Code(s):    --- Professional ---                       [phone], Colonoscopy, flexible; with removal of tumor(s),                        polyp(s), or other lesion(s) by snare technique                       [id number], 34, Colonoscopy, flexible; with biopsy, single                        or  multiple Diagnosis Code(s):    --- Professional ---                       D12.0, Benign neoplasm of cecum                       D12.2, Benign neoplasm of ascending colon                       K64.0, First degree hemorrhoids                       D50.9, Iron deficiency anemia, unspecified                       K57.30, Diverticulosis of large intestine without                        perforation or abscess without bleeding CPT copyright 2017 American Medical Association. All rights reserved. The codes documented in this report are preliminary and upon coder review may  be revised to meet current compliance requirements. Jonathon Bellows, MD Jonathon Bellows MD, MD 10/17/2017 9:12:07 AM This report has been signed electronically. Number of Addenda: 0 Note Initiated On: 10/17/2017 8:36 AM Scope Withdrawal Time: 0 hours 13 minutes 45 seconds  Total Procedure Duration: 0 hours 17 minutes 41 seconds       [facility]

## 2017-10-17 NOTE — Anesthesia Procedure Notes (Signed)
Performed by: Cook-Martin, Colin Norment Pre-anesthesia Checklist: Patient identified, Emergency Drugs available, Suction available, Patient being monitored and Timeout performed Patient Re-evaluated:Patient Re-evaluated prior to induction Oxygen Delivery Method: Nasal cannula Preoxygenation: Pre-oxygenation with 100% oxygen Induction Type: IV induction Airway Equipment and Method: Bite block Placement Confirmation: positive ETCO2 and CO2 detector       

## 2017-10-17 NOTE — Op Note (Signed)
[facility] Gastroenterology Patient Name: Debra Perez Procedure Date: 10/17/2017 7:42 AM MRN: [id number] Account #: [id number] Date of Birth: 03-11-1955 Admit Type: Inpatient Age: 62 Room: [facility] ENDO ROOM 1 Gender: Female Note Status: Finalized Procedure:            Upper GI endoscopy Indications:          Iron deficiency anemia Providers:            Jonathon Bellows MD, MD Referring MD:         Rusty Aus, MD (Referring MD) Medicines:            Monitored Anesthesia Care Complications:        No immediate complications. Procedure:            Pre-Anesthesia Assessment:                       - Prior to the procedure, a History and Physical was                        performed, and patient medications, allergies and                        sensitivities were reviewed. The patient's tolerance of                        previous anesthesia was reviewed.                       - The risks and benefits of the procedure and the                        sedation options and risks were discussed with the                        patient. All questions were answered and informed                        consent was obtained.                       - ASA Grade Assessment: II - A patient with mild                        systemic disease.                       After obtaining informed consent, the endoscope was                        passed under direct vision. Throughout the procedure,                        the patient's blood pressure, pulse, and oxygen                        saturations were monitored continuously. The Endoscope                        was introduced through the mouth, and advanced to the  third part of duodenum. The upper GI endoscopy was                        accomplished with ease. The patient tolerated the                        procedure well. Findings:      The examined duodenum was normal. Biopsies for histology were taken with    a cold forceps for evaluation of celiac disease.      The esophagus was normal.      Localized mild inflammation characterized by congestion (edema) and       erythema was found in the gastric antrum. Biopsies were taken with a       cold forceps for histology.      The cardia and gastric fundus were normal on retroflexion.      The Z-line was normal Impression:           - Normal examined duodenum. Biopsied.                       - Normal esophagus.                       - Gastritis. Biopsied.                       - Z-line. Recommendation:       - Await pathology results.                       - Perform a colonoscopy today. Procedure Code(s):    --- Professional ---                       [phone], Esophagogastroduodenoscopy, flexible, transoral;                        with biopsy, single or multiple Diagnosis Code(s):    --- Professional ---                       K29.70, Gastritis, unspecified, without bleeding                       D50.9, Iron deficiency anemia, unspecified CPT copyright 2017 American Medical Association. All rights reserved. The codes documented in this report are preliminary and upon coder review may  be revised to meet current compliance requirements. Jonathon Bellows, MD Jonathon Bellows MD, MD 10/17/2017 8:50:18 AM This report has been signed electronically. Number of Addenda: 0 Note Initiated On: 10/17/2017 7:42 AM      [facility]

## 2017-10-17 NOTE — Transfer of Care (Signed)
Immediate Anesthesia Transfer of Care Note  Patient: [name]  Procedure(s) Performed: ESOPHAGOGASTRODUODENOSCOPY (EGD) WITH PROPOFOL (N/A ) COLONOSCOPY WITH PROPOFOL (N/A )  Patient Location: PACU  Anesthesia Type:MAC  Level of Consciousness: awake and sedated  Airway & Oxygen Therapy: Patient Spontanous Breathing and Patient connected to nasal cannula oxygen  Post-op Assessment: Report given to RN and Post -op Vital signs reviewed and stable  Post vital signs: Reviewed and stable  Last Vitals:  Vitals Value Taken Time  BP    Temp    Pulse    Resp    SpO2      Last Pain:  Vitals:   10/17/17 0805  PainSc: 5          Complications: No apparent anesthesia complications

## 2017-10-17 NOTE — Telephone Encounter (Signed)
   [facility] Pre-operative Risk Assessment    Request for surgical clearance:  1. What type of surgery is being performed? Bunionectomy with metatarsal osteotomy and hammertoe repair  2. When is this surgery scheduled? Not listed  3. What type of clearance is required (medical clearance vs. Pharmacy clearance to hold med vs. Both)? Not listed  4. Are there any medications that need to be held prior to surgery and how long? Not listed  5. Practice name and name of physician performing surgery?  [facility]  6. What is your office phone number [phone]   7.   What is your office fax number [phone]  8.   Anesthesia type (None, local, MAC, general) ? Not listed   Lucienne Minks 10/17/2017, 12:17 PM  _________________________________________________________________   (provider comments below)

## 2017-10-17 NOTE — Telephone Encounter (Signed)
"  Preoperatively vascular Acceptable risk to hold anticoagulation 2-3 days prior to colonoscopy and foot surgery No further testing needed"    Routed to number provided via EPIC fax along with office visit note with clearance.

## 2017-10-17 NOTE — Anesthesia Preprocedure Evaluation (Signed)
Anesthesia Evaluation  Patient identified by MRN, date of birth, ID band Patient awake    Reviewed: Allergy & Precautions, H&P , NPO status , Patient's Chart, lab work & pertinent test results, reviewed documented beta blocker date and time   Airway Mallampati: II  TM Distance: >3 FB Neck ROM: full    Dental  (+) Caps, Dental Advidsory Given, Teeth Intact   Pulmonary neg shortness of breath, sleep apnea and Continuous Positive Airway Pressure Ventilation , neg COPD, neg recent URI, former smoker,    Pulmonary exam normal        Cardiovascular Exercise Tolerance: Good hypertension, (-) angina+ CAD  (-) Past MI, (-) Cardiac Stents and (-) CABG + dysrhythmias Atrial Fibrillation + Valvular Problems/Murmurs MVP      Neuro/Psych negative neurological ROS  negative psych ROS   GI/Hepatic Neg liver ROS, GERD  ,  Endo/Other  diabetesHypothyroidism   Renal/GU negative Renal ROS  negative genitourinary   Musculoskeletal   Abdominal   Peds  Hematology negative hematology ROS (+)   Anesthesia Other Findings Past Medical History: No date: Atrial fibrillation (HCC) No date: Carotid artery occlusion     Comment:  20 % left side No date: Diabetes mellitus without complication (HCC) No date: GERD (gastroesophageal reflux disease) No date: History of colonic polyps     Comment:  benign No date: Hyperlipidemia No date: Hypertension No date: Hypothyroidism No date: Lung infection No date: Mitral valve prolapse No date: Obstructive sleep apnea     Comment:  on CPAP No date: Sleep apnea No date: Thyroid disease No date: Ventricular tachycardia (paroxysmal) (HCC)   Reproductive/Obstetrics negative OB ROS                             Anesthesia Physical Anesthesia Plan  ASA: III  Anesthesia Plan: General   Post-op Pain Management:    Induction: Intravenous  PONV Risk Score and Plan: 3 and  Propofol infusion and TIVA  Airway Management Planned: Nasal Cannula  Additional Equipment:   Intra-op Plan:   Post-operative Plan:   Informed Consent: I have reviewed the patients History and Physical, chart, labs and discussed the procedure including the risks, benefits and alternatives for the proposed anesthesia with the patient or authorized representative who has indicated his/her understanding and acceptance.   Dental Advisory Given  Plan Discussed with: Anesthesiologist, CRNA and Surgeon  Anesthesia Plan Comments:         Anesthesia Quick Evaluation

## 2017-10-17 NOTE — H&P (Signed)
Jonathon Bellows, MD [address], [place], [place], [place], [address] [address], [place], [place], [place], [address] Phone: [phone]  Fax: [phone]  Primary Care Physician:  Pleas [name], NP   Pre-Procedure History & Physical: HPI:  Debra Perez is a 62 y.o. female is here for an endoscopy and colonoscopy    Past Medical History:  Diagnosis Date  . Atrial fibrillation ([place])   . 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 Diabetes mellitus without complication ([facility])   . GERD (gastroesophageal reflux disease)   . History of colonic polyps    benign  . Hyperlipidemia   . Hypertension   . Hypothyroidism   . Lung infection   . Mitral valve prolapse   . Obstructive sleep apnea    on CPAP  . Sleep apnea   . Thyroid disease   . Ventricular tachycardia (paroxysmal) [facility])     Past Surgical History:  Procedure Laterality Date  . ABDOMINAL HYSTERECTOMY  2002   ovaries remain  . CLAVICLE SURGERY  2007   right clavicle plate pins  . KNEE ARTHROSCOPY  2012    Prior to Admission medications   Medication Sig Start Date End Date Taking? Authorizing Provider  [name] 50 MG tablet TAKE 1 TABLET BY MOUTH AT BEDTIME 08/08/17  Yes Pleas [name], NP  b complex vitamins capsule Take 1 capsule by mouth 2 (two) times daily.   Yes [provider]  cyclobenzaprine (FLEXERIL) 5 MG tablet TAKE 1 TABLET BY MOUTH AT BEDTIME AS NEEDED FOR MUSCLE SPASMS 10/03/17  Yes Pleas [name], NP  diclofenac sodium (VOLTAREN) 1 % GEL Apply 2 g topically 4 (four) times daily. Patient taking differently: Apply 2 g topically 4 (four) times daily as needed.  06/20/17  Yes Pleas [name], NP  ezetimibe (ZETIA) 10 MG tablet TAKE 1 TABLET BY MOUTH DAILY GENERIC EQUIVALENT FOR ZETIA 02/10/17  Yes Gollan, Kathlene November, MD  fluticasone (FLONASE) 50 MCG/ACT nasal spray fluticasone propionate 50 mcg/actuation nasal spray,suspension PRN   Yes [provider]    ibandronate (BONIVA) 150 MG tablet Take one tablet by mouth once monthly.Take in morning with water, on an empty stomach, without medications, food, or lying down for 30 min. 06/19/17  Yes Pleas [name], NP  levothyroxine (SYNTHROID, LEVOTHROID) 88 MCG tablet TAKE 1 TABLET BY MOUTH EVERY DAY BEFORE BREAKFAST 11/18/16  Yes Pleas [name], NP  Magnesium 500 MG CAPS Take 1 capsule by mouth 2 (two) times daily.   Yes [provider]  metroNIDAZOLE (METROGEL) 1 % gel metronidazole 1 % topical gel   Yes [provider]  propafenone (RYTHMOL SR) 225 MG 12 hr capsule Take 1 capsule (225 mg total) by mouth 2 (two) times daily. 03/28/17  Yes Gollan, Kathlene November, MD  quinapril (ACCUPRIL) 20 MG tablet TAKE 1 TABLET BY MOUTH AT BEDTIME. GENERIC EQUIVALENT FOR ACCUPRIL. 02/13/17  Yes Gollan, Kathlene November, MD  ranitidine (ZANTAC) 150 MG tablet Take 1 tablet (150 mg total) by mouth 2 (two) times daily. 09/04/17  Yes Pleas [name], NP  rosuvastatin (CRESTOR) 10 MG tablet Take 10 mg by mouth daily.   Yes [provider]  venlafaxine XR (EFFEXOR-XR) 37.5 MG 24 hr capsule TAKE ONE CAPSULE BY MOUTH DAILY WITH BREAKFAST 09/04/17  Yes Pleas [name], NP  verapamil (CALAN-SR) 120 MG CR tablet Take 1 tablet (120 mg total) by mouth at bedtime. Patient taking differently: Take  120 mg by mouth daily.  03/03/17  Yes Minna Merritts, MD  ACCU-CHEK FASTCLIX LANCETS MISC Use as instructed to test blood sugar 2 times daily 10/04/17   Pleas [name], NP  apixaban (ELIQUIS) 5 MG TABS tablet Take one tablet twice a day    [provider]  glipiZIDE (GLUCOTROL XL) 5 MG 24 hr tablet Take 1 tablet (5 mg total) by mouth daily with breakfast. 08/24/17   Pleas [name], NP  glucose blood (ACCU-CHEK AVIVA PLUS) test strip Use to test blood sugar up to 2 times daily 10/04/17   Pleas [name], NP  ipratropium (ATROVENT HFA) 17 MCG/ACT inhaler Inhale 2 puffs into the lungs every 4 (four)  hours as needed for wheezing. Wheezing and tight chest Patient not taking: Reported on 10/17/2017 06/20/17   Pleas [name], NP  JANUMET 50-1000 MG tablet TAKE 1 TABLET BY MOUTH TWICE DAILY WITH A MEAL. 05/18/17   Pleas [name], NP  metoprolol tartrate (LOPRESSOR) 25 MG tablet Take 1 tablet (25 mg total) by mouth 2 (two) times daily. Patient not taking: Reported on 10/17/2017 10/28/16   Minna Merritts, MD  Multiple Vitamins-Minerals (MULTIVITAMIN ADULT PO) Take 1 capsule by mouth 2 (two) times daily.     [provider]  predniSONE (DELTASONE) 10 MG tablet Take 10 mg by mouth as needed.  08/16/17   [provider]    Allergies as of 08/30/2017  . (No Known Allergies)    Family History  Problem Relation Age of Onset  . Depression Mother   . Sudden death Mother 5  . Heart attack Mother   . Arthritis Father   . Heart disease Father   . Hypertension Father   . Emphysema Father   . Bladder Cancer Sister   . Diabetes Sister        Type 2  . Diabetes Brother        type  . Hypertension Brother   . Emphysema Brother   . Heart disease Brother   . Diabetes Maternal Grandfather   . Bladder Cancer Maternal Grandmother   . Breast cancer Paternal Grandmother   . Cancer Paternal Grandfather        colon cancer  . Diabetes Paternal Grandfather     Social History   Socioeconomic History  . Marital status: Married    Spouse name: Not on file  . Number of children: Not on file  . Years of education: Not on file  . Highest education level: Not on file  Occupational History  . Occupation: retired  Scientific laboratory technician  . Financial resource strain: Not on file  . Food insecurity:    Worry: Not on file    Inability: Not on file  . Transportation needs:    Medical: Not on file    Non-medical: Not on file  Tobacco Use  . Smoking status: Former Smoker    Packs/day: 1.00    Years: 20.00    Pack years: 20.00    Types: Cigarettes    Last attempt to quit: 03/07/2004     Years since quitting: 13.6  . Smokeless tobacco: Never Used  Substance and Sexual Activity  . Alcohol use: No    Alcohol/week: 0.0 standard drinks  . Drug use: No  . Sexual activity: Not on file  Lifestyle  . Physical activity:    Days per week: Not on file    Minutes per session: Not on file  . Stress: Not on file  Relationships  . Social connections:    Talks on phone: Not on file    Gets together: Not on file    Attends religious service: Not on file    Active member of club or organization: Not on file    Attends meetings of clubs or organizations: Not on file    Relationship status: Not on file  . Intimate partner violence:    Fear of current or ex partner: Not on file    Emotionally abused: Not on file    Physically abused: Not on file    Forced sexual activity: Not on file  Other Topics Concern  . Not on file  Social History Narrative   Married.   1 daughter.   Moved here from [place] due to husbands occupation.   She is a Corporate treasurer.   Enjoys sewing, gardening.    Review of Systems: See HPI, otherwise negative ROS  Physical Exam: BP 131/72   Pulse 91   Temp (!) 97 F (36.1 C)   Resp 18   Ht 5\' 6"  (1.676 m)   Wt 79.4 kg   SpO2 98%   BMI 28.25 kg/m  General:   Alert,  pleasant and cooperative in NAD Head:  Normocephalic and atraumatic. Neck:  Supple; no masses or thyromegaly. Lungs:  Clear throughout to auscultation, normal respiratory effort.    Heart:  +S1, +S2, Regular rate and rhythm, No edema. Abdomen:  Soft, nontender and nondistended. Normal bowel sounds, without guarding, and without rebound.   Neurologic:  Alert and  oriented x4;  grossly normal neurologically.  Impression/Plan: Armed forces logistics/support/administrative Perez is here for an endoscopy and colonoscopy  to be performed for  evaluation of iron deficiency anemia    Risks, benefits, limitations, and alternatives regarding endoscopy have been reviewed with the patient.  Questions have been answered.  All parties  agreeable.   Jonathon Bellows, MD  10/17/2017, 8:20 AM

## 2017-10-17 NOTE — Anesthesia Post-op Follow-up Note (Signed)
Anesthesia QCDR form completed.        

## 2017-10-18 LAB — SURGICAL PATHOLOGY

## 2017-10-18 MED ORDER — GLUCOSE BLOOD VI STRP: ORAL_STRIP | 1 refills | Status: DC

## 2017-10-18 NOTE — Anesthesia Postprocedure Evaluation (Signed)
Anesthesia Post Note  Patient: [name]  Procedure(s) Performed: ESOPHAGOGASTRODUODENOSCOPY (EGD) WITH PROPOFOL (N/A ) COLONOSCOPY WITH PROPOFOL (N/A )  Patient location during evaluation: Endoscopy Anesthesia Type: General Level of consciousness: awake and alert Pain management: pain level controlled Vital Signs Assessment: post-procedure vital signs reviewed and stable Respiratory status: spontaneous breathing, nonlabored ventilation, respiratory function stable and patient connected to nasal cannula oxygen Cardiovascular status: blood pressure returned to baseline and stable Postop Assessment: no apparent nausea or vomiting Anesthetic complications: no     Last Vitals:  Vitals:   10/17/17 0940 10/17/17 0950  BP: 118/76 120/71  Pulse: 68 67  Resp: 19 17  Temp:    SpO2: 96% 98%    Last Pain:  Vitals:   10/17/17 0910  TempSrc: Tympanic  PainSc:                  Debra Perez

## 2017-10-19 ENCOUNTER — Encounter: Payer: Self-pay | Admitting: Primary Care

## 2017-10-20 ENCOUNTER — Inpatient Hospital Stay: Payer: Managed Care, Other (non HMO)

## 2017-10-20 VITALS — BP 130/88 | HR 80 | Temp 97.6°F | Resp 18

## 2017-10-20 DIAGNOSIS — D509 Iron deficiency anemia, unspecified: Secondary | ICD-10-CM

## 2017-10-20 MED ORDER — IRON SUCROSE 20 MG/ML IV SOLN
200.0000 mg | Freq: Once | INTRAVENOUS | Status: AC
  Administered 2017-10-20: 200 mg via INTRAVENOUS
  Filled 2017-10-20: qty 10

## 2017-10-20 MED ORDER — SODIUM CHLORIDE 0.9 % IV SOLN
Freq: Once | INTRAVENOUS | Status: AC
  Administered 2017-10-20: 14:00:00 via INTRAVENOUS
  Filled 2017-10-20: qty 1000

## 2017-10-20 NOTE — Telephone Encounter (Signed)
Pt asking that accu check guide strips 90 day supply be sent to the local pharmacy as the mail order won't send out the RX again for 3 months bc the insurance won't pay.  Pt will pay out of pocket. Office sent incorrect RX the first time. Pt states she will be out of strips tomorrow.  [id number] - [address], [place] [phone] (Phone) [phone] (Fax

## 2017-10-23 ENCOUNTER — Encounter: Payer: Self-pay | Admitting: Gastroenterology

## 2017-10-23 ENCOUNTER — Ambulatory Visit (INDEPENDENT_AMBULATORY_CARE_PROVIDER_SITE_OTHER): Payer: Managed Care, Other (non HMO) | Admitting: Internal Medicine

## 2017-10-23 VITALS — BP 130/84 | HR 69 | Temp 97.9°F | Wt 178.0 lb

## 2017-10-23 DIAGNOSIS — B309 Viral conjunctivitis, unspecified: Secondary | ICD-10-CM

## 2017-10-23 MED ORDER — GLUCOSE BLOOD VI STRP: ORAL_STRIP | 1 refills | Status: DC

## 2017-10-23 NOTE — Patient Instructions (Signed)
Viral Conjunctivitis, Adult Viral conjunctivitis is an inflammation of the clear membrane that covers the white part of your eye and the inner surface of your eyelid (conjunctiva). The inflammation is caused by a viral infection. The blood vessels in the conjunctiva become inflamed, causing the eye to become red or pink, and often itchy. Viral conjunctivitis can be easily passed from one person to another (is contagious). This condition is often called pink eye. What are the causes? This condition is caused by a virus. A virus is a type of contagious germ. It can be spread by touching objects that have been contaminated with the virus, such as doorknobs or towels. It can also be passed through droplets, such as from coughing or sneezing. What are the signs or symptoms? Symptoms of this condition include:  Eye redness.  Tearing or watery eyes.  Itchy and irritated eyes.  Burning feeling in the eyes.  Clear drainage from the eye.  Swollen eyelids.  A gritty feeling in the eye.  Light sensitivity.  This condition often occurs with other symptoms, such as a fever, nausea, or a rash. How is this diagnosed? This condition is diagnosed with a medical history and physical exam. If you have discharge from your eye, the discharge may be tested to rule out other causes of conjunctivitis. How is this treated? Viral conjunctivitis does not respond to medicines that kill bacteria (antibiotics). Treatment for viral conjunctivitis is directed at stopping a bacterial infection from developing in addition to the viral infection. Treatment also aims to relieve your symptoms, such as itching. This may be done with antihistamine drops or other eye medicines. Rarely, steroid eye drops or antiviral medicines may be prescribed. Follow these instructions at home: Medicines   Take or apply over-the-counter and prescription medicines only as told by your health care provider.  Be very careful to avoid  touching the edge of the eyelid with the eye drop bottle or ointment tube when applying medicines to the affected eye. Being careful this way will stop you from spreading the infection to the other eye or to other people. Eye care  Avoid touching or rubbing your eyes.  Apply a warm, wet, clean washcloth to your eye for 10-20 minutes, 3-4 times per day or as told by your health care provider.  If you wear contact lenses, do not wear them until the inflammation is gone and your health care provider says it is safe to wear them again. Ask your health care provider how to sterilize or replace your contact lenses before using them again. Wear glasses until you can resume wearing contacts.  Avoid wearing eye makeup until the inflammation is gone. Throw away any old eye cosmetics that may be contaminated.  Gently wipe away any drainage from your eye with a warm, wet washcloth or a cotton ball. General instructions  Change or wash your pillowcase every day or as told by your health care provider.  Do not share towels, pillowcases, washcloths, eye makeup, makeup brushes, contact lenses, or glasses. This may spread the infection.  Wash your hands often with soap and water. Use paper towels to dry your hands. If soap and water are not available, use hand sanitizer.  Try to avoid contact with other people for one week or as told by your health care provider. Contact a health care provider if:  Your symptoms do not improve with treatment or they get worse.  You have increased pain.  Your vision becomes blurry.  You have a   fever.  You have facial pain, redness, or swelling.  You have yellow or green drainage coming from your eye.  You have new symptoms. This information is not intended to replace advice given to you by your health care provider. Make sure you discuss any questions you have with your health care provider. Document Released: 05/14/2002 Document Revised: 09/19/2015 Document  Reviewed: 09/08/2015 Elsevier Interactive Patient Education  2018 [employer]  

## 2017-10-23 NOTE — Progress Notes (Signed)
Subjective:    Patient ID: Debra Perez, female    DOB: June 04, 1955, 62 y.o.   MRN: [id number]  HPI  Pt presents to the clinic today with c/o drainage from the left eye. She noticed this 5 days ago. She reports associated blurred vision and itchiness. She feels like there is a film over her eye. She denies eye pressure or vision loss. She denies runny nose, nasal congestion, ear pain or sore throat. She does have contacts but has not been wearing them since that time. She has tried warm compresses and saline eye drops with good relief.  Review of Systems      Past Medical History:  Diagnosis Date  . Atrial fibrillation ([place])   . 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 Diabetes mellitus without complication ([place])   . Diverticulosis   . GERD (gastroesophageal reflux disease)   . History of colonic polyps    benign  . Hyperlipidemia   . Hypertension   . Hypothyroidism   . Lung infection   . Mitral valve prolapse   . Obstructive sleep apnea    on CPAP  . Sleep apnea   . Thyroid disease   . Ventricular tachycardia (paroxysmal) ([facility])     Current Outpatient Medications  Medication Sig Dispense Refill  . ACCU-CHEK FASTCLIX LANCETS MISC Use as instructed to test blood sugar 2 times daily 300 each 3  . [name] 50 MG tablet TAKE 1 TABLET BY MOUTH AT BEDTIME 90 tablet 0  . apixaban (ELIQUIS) 5 MG TABS tablet Take one tablet twice a day    . b complex vitamins capsule Take 1 capsule by mouth 2 (two) times daily.    . cyclobenzaprine (FLEXERIL) 5 MG tablet TAKE 1 TABLET BY MOUTH AT BEDTIME AS NEEDED FOR MUSCLE SPASMS 90 tablet 0  . diclofenac sodium (VOLTAREN) 1 % GEL Apply 2 g topically 4 (four) times daily. (Patient taking differently: Apply 2 g topically 4 (four) times daily as needed. ) 100 g 0  . ezetimibe (ZETIA) 10 MG tablet TAKE 1 TABLET BY MOUTH DAILY GENERIC EQUIVALENT FOR ZETIA 90 tablet 2  . fluticasone (FLONASE) 50 MCG/ACT nasal spray fluticasone  propionate 50 mcg/actuation nasal spray,suspension PRN    . glipiZIDE (GLUCOTROL XL) 5 MG 24 hr tablet Take 1 tablet (5 mg total) by mouth daily with breakfast. 90 tablet 1  . glucose blood (ACCU-CHEK GUIDE) test strip Use to test blood sugar up to 2 times daily 300 each 1  . ibandronate (BONIVA) 150 MG tablet Take one tablet by mouth once monthly.Take in morning with water, on an empty stomach, without medications, food, or lying down for 30 min. 4 tablet 3  . ipratropium (ATROVENT HFA) 17 MCG/ACT inhaler Inhale 2 puffs into the lungs every 4 (four) hours as needed for wheezing. Wheezing and tight chest 1 Inhaler 0  . JANUMET 50-1000 MG tablet TAKE 1 TABLET BY MOUTH TWICE DAILY WITH A MEAL. 180 tablet 1  . levothyroxine (SYNTHROID, LEVOTHROID) 88 MCG tablet TAKE 1 TABLET BY MOUTH EVERY DAY BEFORE BREAKFAST 90 tablet 1  . Magnesium 500 MG CAPS Take 1 capsule by mouth 2 (two) times daily.    . metoprolol tartrate (LOPRESSOR) 25 MG tablet Take 1 tablet (25 mg total) by mouth 2 (two) times daily. 180 tablet 3  . metroNIDAZOLE (METROGEL) 1 % gel metronidazole 1 % topical gel    . Multiple Vitamins-Minerals (MULTIVITAMIN ADULT PO) Take 1 capsule by  mouth 2 (two) times daily.     . predniSONE (DELTASONE) 10 MG tablet Take 10 mg by mouth as needed.   0  . propafenone (RYTHMOL SR) 225 MG 12 hr capsule Take 1 capsule (225 mg total) by mouth 2 (two) times daily. 180 capsule 3  . quinapril (ACCUPRIL) 20 MG tablet TAKE 1 TABLET BY MOUTH AT BEDTIME. GENERIC EQUIVALENT FOR ACCUPRIL. 90 tablet 3  . ranitidine (ZANTAC) 150 MG tablet Take 1 tablet (150 mg total) by mouth 2 (two) times daily. 180 tablet 2  . rosuvastatin (CRESTOR) 10 MG tablet Take 10 mg by mouth daily.    Marland Kitchen venlafaxine XR (EFFEXOR-XR) 37.5 MG 24 hr capsule TAKE ONE CAPSULE BY MOUTH DAILY WITH BREAKFAST 90 capsule 2  . verapamil (CALAN-SR) 120 MG CR tablet Take 1 tablet (120 mg total) by mouth at bedtime. (Patient taking differently: Take 120 mg by  mouth daily. ) 90 tablet 3   No current facility-administered medications for this visit.    Facility-Administered Medications Ordered in Other Visits  Medication Dose Route Frequency Provider Last Rate Last Dose  . heparin lock flush 100 unit/mL  500 Units Intracatheter Once PRN Earlie Server, MD      . sodium chloride flush (NS) 0.9 % injection 10 mL  10 mL Intracatheter PRN Earlie Server, MD   10 mL at 10/06/17 1515    No Known Allergies  Family History  Problem Relation Age of Onset  . Depression Mother   . Sudden death Mother 86  . Heart attack Mother   . Arthritis Father   . Heart disease Father   . Hypertension Father   . Emphysema Father   . Bladder Cancer Sister   . Diabetes Sister        Type 2  . Diabetes Brother        type  . Hypertension Brother   . Emphysema Brother   . Heart disease Brother   . Diabetes Maternal Grandfather   . Bladder Cancer Maternal Grandmother   . Breast cancer Paternal Grandmother   . Cancer Paternal Grandfather        colon cancer  . Diabetes Paternal Grandfather     Social History   Socioeconomic History  . Marital status: Married    Spouse name: Not on file  . Number of children: Not on file  . Years of education: Not on file  . Highest education level: Not on file  Occupational History  . Occupation: retired  Scientific laboratory technician  . Financial resource strain: Not on file  . Food insecurity:    Worry: Not on file    Inability: Not on file  . Transportation needs:    Medical: Not on file    Non-medical: Not on file  Tobacco Use  . Smoking status: Former Smoker    Packs/day: 1.00    Years: 20.00    Pack years: 20.00    Types: Cigarettes    Last attempt to quit: 03/07/2004    Years since quitting: 13.6  . Smokeless tobacco: Never Used  Substance and Sexual Activity  . Alcohol use: No    Alcohol/week: 0.0 standard drinks  . Drug use: No  . Sexual activity: Not on file  Lifestyle  . Physical activity:    Days per week: Not on file      Minutes per session: Not on file  . Stress: Not on file  Relationships  . Social connections:    Talks on phone: Not  on file    Gets together: Not on file    Attends religious service: Not on file    Active member of club or organization: Not on file    Attends meetings of clubs or organizations: Not on file    Relationship status: Not on file  . Intimate partner violence:    Fear of current or ex partner: Not on file    Emotionally abused: Not on file    Physically abused: Not on file    Forced sexual activity: Not on file  Other Topics Concern  . Not on file  Social History Narrative   Married.   1 daughter.   Moved here from [place] due to husbands occupation.   She is a Corporate treasurer.   Enjoys sewing, gardening.     Constitutional: Denies fever, malaise, fatigue, headache or abrupt weight changes.  HEENT: Pt reports left eye irritation, drainage. Denies eye pain, eye redness, ear pain, ringing in the ears, wax buildup, runny nose, nasal congestion, bloody nose, or sore throat. Respiratory: Denies difficulty breathing, shortness of breath, cough or sputum production.    No other specific complaints in a complete review of systems (except as listed in HPI above).  Objective:   Physical Exam  BP 130/84   Pulse 69   Temp 97.9 F (36.6 C) (Oral)   Wt 178 lb (80.7 kg)   SpO2 97%   BMI 28.73 kg/m  Wt Readings from Last 3 Encounters:  10/23/17 178 lb (80.7 kg)  10/17/17 175 lb (79.4 kg)  09/27/17 177 lb 8 oz (80.5 kg)    General: Appears her stated age, well developed, well nourished in NAD. HEENT: Head: normal shape and size; Eyes: sclera white, no icterus, conjunctiva pink;   BMET    Component Value Date/Time   NA 139 08/24/2017 0842   K 4.0 08/24/2017 0842   CL 99 08/24/2017 0842   CO2 33 (H) 08/24/2017 0842   GLUCOSE 140 (H) 08/24/2017 0842   BUN 15 08/24/2017 0842   CREATININE 0.80 08/24/2017 0842   CALCIUM 9.4 08/24/2017 0842   GFRNONAA >60 12/03/2015 1918    GFRAA >60 12/03/2015 1918    Lipid Panel     Component Value Date/Time   CHOL 123 08/24/2017 0842   TRIG 78.0 08/24/2017 0842   HDL 61.40 08/24/2017 0842   CHOLHDL 2 08/24/2017 0842   VLDL 15.6 08/24/2017 0842   LDLCALC 46 08/24/2017 0842    CBC    Component Value Date/Time   WBC 9.3 10/04/2017 1039   WBC 9.2 11/09/2016 1625   RBC 4.45 11/09/2016 1625   HGB 12.8 10/04/2017 1039   HCT 39 10/04/2017 1039   PLT 365 10/04/2017 1039   MCV 74.8 (L) 11/09/2016 1625   MCH 22.6 (L) 12/03/2015 1918   MCHC 31.0 11/09/2016 1625   RDW 20.8 (H) 11/09/2016 1625    Hgb A1C Lab Results  Component Value Date   HGBA1C 7.8 (H) 08/24/2017            Assessment & Plan:   Viral Conjunctivitis, Left Eye:  Exam benign No indication for abx Continue warm compresses and saline eye drops  Return precautions discussed Webb Silversmith, NP

## 2017-10-27 ENCOUNTER — Inpatient Hospital Stay: Payer: Managed Care, Other (non HMO)

## 2017-10-27 VITALS — BP 126/76 | HR 79 | Temp 99.0°F | Resp 18

## 2017-10-27 DIAGNOSIS — D509 Iron deficiency anemia, unspecified: Secondary | ICD-10-CM

## 2017-10-27 MED ORDER — IRON SUCROSE 20 MG/ML IV SOLN
200.0000 mg | Freq: Once | INTRAVENOUS | Status: AC
  Administered 2017-10-27: 200 mg via INTRAVENOUS
  Filled 2017-10-27: qty 10

## 2017-10-27 MED ORDER — SODIUM CHLORIDE 0.9 % IV SOLN: 200.0000 mg | Freq: Once | INTRAVENOUS | Status: DC

## 2017-10-27 MED ORDER — SODIUM CHLORIDE 0.9 % IV SOLN
Freq: Once | INTRAVENOUS | Status: AC
  Administered 2017-10-27: 14:00:00 via INTRAVENOUS
  Filled 2017-10-27: qty 250

## 2017-10-30 ENCOUNTER — Other Ambulatory Visit: Payer: Self-pay | Admitting: Primary Care

## 2017-10-30 DIAGNOSIS — E119 Type 2 diabetes mellitus without complications: Secondary | ICD-10-CM

## 2017-11-12 ENCOUNTER — Encounter: Payer: Self-pay | Admitting: Gastroenterology

## 2017-11-13 ENCOUNTER — Telehealth: Payer: Self-pay | Admitting: Gastroenterology

## 2017-11-13 NOTE — Telephone Encounter (Signed)
Pt left vm she states she had colonoscopy with Dr. Vicente Males and he wanted her to have a Camera study set up please call pt

## 2017-11-14 ENCOUNTER — Telehealth: Payer: Self-pay | Admitting: Gastroenterology

## 2017-11-14 NOTE — Telephone Encounter (Signed)
Pt is calling about prev. Message to schedule a camera study

## 2017-11-15 ENCOUNTER — Other Ambulatory Visit: Payer: Self-pay

## 2017-11-15 DIAGNOSIS — D509 Iron deficiency anemia, unspecified: Secondary | ICD-10-CM

## 2017-11-16 ENCOUNTER — Other Ambulatory Visit: Payer: Self-pay

## 2017-11-16 NOTE — Telephone Encounter (Signed)
Spoke with pt and informed her that we have scheduled her Capsule study. Pt is aware of appointment date and I have sent her prep instructions via pt MyChart and mail, per pt request.

## 2017-11-27 NOTE — Telephone Encounter (Signed)
Patient would like to go on low dose metoprolol. What dose do you want [place] to send in? Thanks!

## 2017-11-28 ENCOUNTER — Other Ambulatory Visit: Payer: Self-pay | Admitting: Primary Care

## 2017-11-28 ENCOUNTER — Other Ambulatory Visit: Payer: Self-pay | Admitting: Cardiovascular Disease

## 2017-11-30 ENCOUNTER — Encounter
Admission: RE | Disposition: A | Discharge status: Home / Self Care | Payer: Self-pay | Source: Ambulatory Visit | Attending: Gastroenterology

## 2017-11-30 ENCOUNTER — Ambulatory Visit
Admission: RE | Admit: 2017-11-30 | Discharge: 2017-11-30 | Disposition: A | Discharge status: Home / Self Care | Payer: Managed Care, Other (non HMO) | Source: Ambulatory Visit | Attending: Gastroenterology | Admitting: Gastroenterology

## 2017-11-30 ENCOUNTER — Encounter: Payer: Self-pay | Admitting: Emergency Medicine

## 2017-11-30 DIAGNOSIS — K2970 Gastritis, unspecified, without bleeding: Secondary | ICD-10-CM | POA: Diagnosis not present

## 2017-11-30 DIAGNOSIS — K2980 Duodenitis without bleeding: Secondary | ICD-10-CM | POA: Insufficient documentation

## 2017-11-30 DIAGNOSIS — K269 Duodenal ulcer, unspecified as acute or chronic, without hemorrhage or perforation: Secondary | ICD-10-CM | POA: Diagnosis not present

## 2017-11-30 DIAGNOSIS — Q2733 Arteriovenous malformation of digestive system vessel: Secondary | ICD-10-CM | POA: Diagnosis not present

## 2017-11-30 HISTORY — PX: GIVENS CAPSULE STUDY: SHX5432

## 2017-11-30 SURGERY — IMAGING PROCEDURE, GI TRACT, INTRALUMINAL, VIA CAPSULE

## 2017-11-30 SURGERY — IMAGING PROCEDURE, GI TRACT, INTRALUMINAL, VIA CAPSULE
Anesthesia: General

## 2017-12-01 ENCOUNTER — Encounter: Payer: Self-pay | Admitting: Oncology

## 2017-12-04 ENCOUNTER — Inpatient Hospital Stay: Payer: Managed Care, Other (non HMO)

## 2017-12-04 ENCOUNTER — Encounter: Payer: Self-pay | Admitting: Oncology

## 2017-12-04 ENCOUNTER — Other Ambulatory Visit: Payer: Self-pay

## 2017-12-04 ENCOUNTER — Inpatient Hospital Stay: Payer: Managed Care, Other (non HMO) | Attending: Oncology | Admitting: Oncology

## 2017-12-04 VITALS — BP 114/75 | HR 79 | Temp 98.4°F | Resp 18 | Wt 172.9 lb

## 2017-12-04 DIAGNOSIS — I4891 Unspecified atrial fibrillation: Secondary | ICD-10-CM | POA: Insufficient documentation

## 2017-12-04 DIAGNOSIS — D509 Iron deficiency anemia, unspecified: Secondary | ICD-10-CM | POA: Diagnosis not present

## 2017-12-04 DIAGNOSIS — Z7901 Long term (current) use of anticoagulants: Secondary | ICD-10-CM | POA: Diagnosis not present

## 2017-12-04 DIAGNOSIS — R5382 Chronic fatigue, unspecified: Secondary | ICD-10-CM | POA: Diagnosis not present

## 2017-12-04 DIAGNOSIS — Z87891 Personal history of nicotine dependence: Secondary | ICD-10-CM | POA: Diagnosis not present

## 2017-12-04 NOTE — Progress Notes (Signed)
Patient here for follow up

## 2017-12-04 NOTE — Progress Notes (Signed)
Hematology/Oncology Consult note [facility] [phone] [phone]   Patient Care Team: Pleas [name], NP as PCP - General (Nurse Practitioner) Minna Merritts, MD as Consulting Physician (Cardiology)  REFERRING PROVIDER: Napoleon Form CHIEF COMPLAINTS/REASON FOR VISIT:  Evaluation of iron deficiency  HISTORY OF PRESENTING ILLNESS:  Debra Perez is a  62 y.o.  female with PMH listed below who was referred to me for evaluation of iron deficiency.  Patient has a history of atrial fibrillation,  is on chronic anticoagulation with Eliquis 5 mg twice daily. Patient had iron panel done on 09/04/2017 at PCPs office.  Reviewed patient's labs.  Iron 35, TIBC 406, ferritin 7, iron saturation 9.  Patient reports history of iron deficiency in the past.  She used to take an oral iron supplements but had GI side effects.  Currently not on any oral iron supplementation.  History of iron deficiency: Yes Rectal bleeding: Denies Menstrual bleeding/ Vaginal bleeding : Denies Hematemesis or hemoptysis : denies Blood in urine : denies  Pica: Craving for ice chips Last endoscopy: Recent EGD was 3 years ago in [place].  She has history of Barrett's esophagitis hiatal hernia. Denies weight loss, fever, chills, fatigue, night sweats.   Fatigue: Chronic onset, perisistent, no aggravating or improving factors, no associated symptoms.   Family history positive for sister diagnosed with bladder cancer, maternal grandmother also had a history of bladder cancer.  Paternal grandmother had breast cancer.  Paternal grandmother father has colon cancer.  INTERVAL HISTORY Debra Perez is a 62 y.o. female who has above history reviewed by me today presents for follow up visit for management of iron deficiency anemia During the interval, she received IV Venofer 200 mg weekly x 4 Reports that fatigue is much better after she finished IV iron.  Recently feels that fatigue is  getting little worse.  Review of Systems  Constitutional: Positive for malaise/fatigue. Negative for chills, fever and weight loss.  HENT: Negative for nosebleeds and sore throat.   Eyes: Negative for double vision, photophobia and redness.  Respiratory: Negative for cough, shortness of breath and wheezing.   Cardiovascular: Negative for chest pain, palpitations and orthopnea.  Gastrointestinal: Positive for heartburn. Negative for abdominal pain, blood in stool, nausea and vomiting.  Genitourinary: Negative for dysuria.  Musculoskeletal: Negative for back pain, myalgias and neck pain.  Skin: Negative for itching and rash.  Neurological: Negative for dizziness, tingling and tremors.  Endo/Heme/Allergies: Negative for environmental allergies. Does not bruise/bleed easily.  Psychiatric/Behavioral: Negative for depression.    MEDICAL HISTORY:  Past Medical History:  Diagnosis Date  . Atrial fibrillation ([place])   . 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 Diabetes mellitus without complication ([place])   . Diverticulosis   . GERD (gastroesophageal reflux disease)   . History of colonic polyps    benign  . Hyperlipidemia   . Hypertension   . Hypothyroidism   . Lung infection   . Mitral valve prolapse   . Obstructive sleep apnea    on CPAP  . Sleep apnea   . Thyroid disease   . Ventricular tachycardia (paroxysmal) ([place])     SURGICAL HISTORY: Past Surgical History:  Procedure Laterality Date  . ABDOMINAL HYSTERECTOMY  2002   ovaries remain  . CLAVICLE SURGERY  2007   right clavicle plate pins  . COLONOSCOPY WITH PROPOFOL N/A 10/17/2017   Procedure: COLONOSCOPY WITH PROPOFOL;  Surgeon: Jonathon Bellows, MD;  Location: [facility] ENDOSCOPY;  Service: Gastroenterology;  Laterality: N/A;  . ESOPHAGOGASTRODUODENOSCOPY (EGD) WITH PROPOFOL N/A 10/17/2017   Procedure: ESOPHAGOGASTRODUODENOSCOPY (EGD) WITH PROPOFOL;  Surgeon: Jonathon Bellows, MD;  Location: [facility] ENDOSCOPY;  Service:  Gastroenterology;  Laterality: N/A;  . KNEE ARTHROSCOPY  2012    SOCIAL HISTORY: Social History   Socioeconomic History  . Marital status: Married    Spouse name: Not on file  . Number of children: Not on file  . Years of education: Not on file  . Highest education level: Not on file  Occupational History  . Occupation: retired  Scientific laboratory technician  . Financial resource strain: Not on file  . Food insecurity:    Worry: Not on file    Inability: Not on file  . Transportation needs:    Medical: Not on file    Non-medical: Not on file  Tobacco Use  . Smoking status: Former Smoker    Packs/day: 1.00    Years: 20.00    Pack years: 20.00    Types: Cigarettes    Last attempt to quit: 03/07/2004    Years since quitting: 13.7  . Smokeless tobacco: Never Used  Substance and Sexual Activity  . Alcohol use: No    Alcohol/week: 0.0 standard drinks  . Drug use: No  . Sexual activity: Not on file  Lifestyle  . Physical activity:    Days per week: Not on file    Minutes per session: Not on file  . Stress: Not on file  Relationships  . Social connections:    Talks on phone: Not on file    Gets together: Not on file    Attends religious service: Not on file    Active member of club or organization: Not on file    Attends meetings of clubs or organizations: Not on file    Relationship status: Not on file  . Intimate partner violence:    Fear of current or ex partner: Not on file    Emotionally abused: Not on file    Physically abused: Not on file    Forced sexual activity: Not on file  Other Topics Concern  . Not on file  Social History Narrative   Married.   1 daughter.   Moved here from [place] due to husbands occupation.   She is a Corporate treasurer.   Enjoys sewing, gardening.    FAMILY HISTORY: Family History  Problem Relation Age of Onset  . Depression Mother   . Sudden death Mother 33  . Heart attack Mother   . Arthritis Father   . Heart disease Father   . Hypertension Father     . Emphysema Father   . Bladder Cancer Sister   . Diabetes Sister        Type 2  . Diabetes Brother        type  . Hypertension Brother   . Emphysema Brother   . Heart disease Brother   . Diabetes Maternal Grandfather   . Bladder Cancer Maternal Grandmother   . Breast cancer Paternal Grandmother   . Cancer Paternal Grandfather        colon cancer  . Diabetes Paternal Grandfather     ALLERGIES:  has No Known Allergies.  MEDICATIONS:  Current Outpatient Medications  Medication Sig Dispense Refill  . ACCU-CHEK FASTCLIX LANCETS MISC Use as instructed to test blood sugar 2 times daily 300 each 3  . [name] 50 MG tablet TAKE 1 TABLET BY MOUTH AT BEDTIME 90 tablet 1  . apixaban (ELIQUIS) 5 MG  TABS tablet Take one tablet twice a day    . Azelastine-Fluticasone (DYMISTA) 137-50 MCG/ACT SUSP Dymista 137 mcg-50 mcg/spray nasal spray    . b complex vitamins capsule Take 1 capsule by mouth 2 (two) times daily.    . cyclobenzaprine (FLEXERIL) 5 MG tablet TAKE 1 TABLET BY MOUTH AT BEDTIME AS NEEDED FOR MUSCLE SPASMS 90 tablet 0  . diclofenac sodium (VOLTAREN) 1 % GEL Apply 2 g topically 4 (four) times daily. (Patient taking differently: Apply 2 g topically 4 (four) times daily as needed. ) 100 g 0  . ezetimibe (ZETIA) 10 MG tablet TAKE 1 TABLET BY MOUTH DAILY GENERIC EQUIVALENT FOR ZETIA 90 tablet 2  . fluticasone (FLONASE) 50 MCG/ACT nasal spray fluticasone propionate 50 mcg/actuation nasal spray,suspension PRN    . glipiZIDE (GLUCOTROL XL) 5 MG 24 hr tablet Take 1 tablet (5 mg total) by mouth daily with breakfast. 90 tablet 1  . glucose blood (ACCU-CHEK GUIDE) test strip Use to test blood sugar up to 2 times daily 300 each 1  . ibandronate (BONIVA) 150 MG tablet Take one tablet by mouth once monthly.Take in morning with water, on an empty stomach, without medications, food, or lying down for 30 min. 4 tablet 3  . ipratropium (ATROVENT HFA) 17 MCG/ACT inhaler Inhale 2 puffs into  the lungs every 4 (four) hours as needed for wheezing. Wheezing and tight chest 1 Inhaler 0  . JANUMET 50-1000 MG tablet TAKE 1 TABLET BY MOUTH TWICE DAILY WITH A MEAL 180 tablet 1  . levothyroxine (SYNTHROID, LEVOTHROID) 88 MCG tablet TAKE 1 TABLET BY MOUTH EVERY DAY BEFORE BREAKFAST 90 tablet 1  . Magnesium 500 MG CAPS Take 1 capsule by mouth 2 (two) times daily.    . metoprolol tartrate (LOPRESSOR) 25 MG tablet Take 1 tablet (25 mg total) by mouth 2 (two) times daily. 180 tablet 3  . metroNIDAZOLE (METROGEL) 1 % gel metronidazole 1 % topical gel    . Multiple Vitamins-Minerals (MULTIVITAMIN ADULT PO) Take 1 capsule by mouth 2 (two) times daily.     . predniSONE (DELTASONE) 10 MG tablet Take 10 mg by mouth as needed.   0  . propafenone (RYTHMOL SR) 225 MG 12 hr capsule Take 1 capsule (225 mg total) by mouth 2 (two) times daily. 180 capsule 3  . quinapril (ACCUPRIL) 20 MG tablet TAKE 1 TABLET BY MOUTH AT BEDTIME. GENERIC EQUIVALENT FOR ACCUPRIL. 90 tablet 3  . ranitidine (ZANTAC) 150 MG tablet Take 1 tablet (150 mg total) by mouth 2 (two) times daily. 180 tablet 2  . rosuvastatin (CRESTOR) 10 MG tablet Take 10 mg by mouth daily.    . rosuvastatin (CRESTOR) 5 MG tablet TAKE 1 TABLET BY MOUTH DAILY. GENERIC EQUIVALENT FOR CRESTOR. FOLLOW UP APPOINTMENT NEEDED FOR FURTHER REFILLS 90 tablet 2  . venlafaxine XR (EFFEXOR-XR) 37.5 MG 24 hr capsule TAKE ONE CAPSULE BY MOUTH DAILY WITH BREAKFAST 90 capsule 2  . verapamil (CALAN-SR) 120 MG CR tablet Take 1 tablet (120 mg total) by mouth at bedtime. (Patient taking differently: Take 120 mg by mouth daily. ) 90 tablet 3   No current facility-administered medications for this visit.    Facility-Administered Medications Ordered in Other Visits  Medication Dose Route Frequency Provider Last Rate Last Dose  . heparin lock flush 100 unit/mL  500 Units Intracatheter Once PRN Earlie Server, MD      . sodium chloride flush (NS) 0.9 % injection 10 mL  10 mL  Intracatheter PRN Tasia Catchings,  Charde Macfarlane, MD   10 mL at 10/06/17 1515     PHYSICAL EXAMINATION: ECOG PERFORMANCE STATUS: 1 - Symptomatic but completely ambulatory Vitals:   12/04/17 1333  BP: 114/75  Pulse: 79  Resp: 18  Temp: 98.4 F (36.9 C)   Filed Weights   12/04/17 1333  Weight: 172 lb 14.4 oz (78.4 kg)    Physical Exam  Constitutional: She is oriented to person, place, and time. No distress.  HENT:  Head: Normocephalic and atraumatic.  Mouth/Throat: Oropharynx is clear and moist.  Eyes: Pupils are equal, round, and reactive to light. EOM are normal. No scleral icterus.  Neck: Normal range of motion. Neck supple.  Cardiovascular: Normal rate, regular rhythm and normal heart sounds.  Pulmonary/Chest: Effort normal. No respiratory distress. She has no wheezes.  Abdominal: Soft. Bowel sounds are normal. She exhibits no distension and no mass. There is no tenderness.  Musculoskeletal: Normal range of motion. She exhibits no edema or deformity.  Neurological: She is alert and oriented to person, place, and time. No cranial nerve deficit. Coordination normal.  Skin: Skin is warm and dry. No rash noted. No erythema.  Psychiatric: She has a normal mood and affect. Her behavior is normal. Thought content normal.     LABORATORY DATA:  I have reviewed the data as listed Lab Results  Component Value Date   WBC 9.3 10/04/2017   HGB 12.8 10/04/2017   HCT 39 10/04/2017   MCV 74.8 (L) 11/09/2016   PLT 365 10/04/2017   Recent Labs    04/19/17 1212 08/24/17 0842  NA 139 139  K 4.2 4.0  CL 104 99  CO2 29 33*  GLUCOSE 117* 140*  BUN 12 15  CREATININE 0.74 0.80  CALCIUM 9.0 9.4  PROT  --  7.2  ALBUMIN  --  4.3  AST  --  15  ALT  --  27  ALKPHOS  --  43  BILITOT  --  0.4   Iron/TIBC/Ferritin/ %Sat    Component Value Date/Time   IRON 35 09/04/2017 1416   TIBC 406 09/04/2017 1416   FERRITIN 7 (L) 09/04/2017 1416   IRONPCTSAT 9 (LL) 09/04/2017 1416     Addendum, will obtain  baseline CBC which was done at [facility] [[id number]] and faxed to [place].  Labs reviewed.  Hemoglobin 12.8, MCV 80, RDW 20.1, platelet count 3 65,000, WBC 9.3, normal differential.  11/30/2017, [facility] Hemoglobin 14.8, hematocrit 43.5, MCV 89, reticulocyte count 280, WBC 7.1, TIBC 297, iron 68, iron saturation 23, ferritin 84.   ASSESSMENT & PLAN:  1. Iron deficiency anemia, unspecified iron deficiency anemia type   2. Chronic anticoagulation    #Labs reviewed and discussed with patient.  Iron panel and hemoglobin has both improved.  Hold additional IV iron for now. Suggest patient may take oral iron supplements once daily with meal as maintenance therapy. Patient has had upper and lower endoscopy in August 2019.  She also had capsule study last week awaiting for results.  Continue follow-up with GI. Continue on Eliquis for chronic atrial fibrillation. [facility] lab requisition provided to patient All questions were answered. The patient knows to call the clinic with any problems questions or concerns.  Return of visit: 4 months Total face to face encounter time for this patient visit was 15 min. >50% of the time was  spent in counseling and coordination of care.  Earlie Server, MD, PhD Hematology Oncology [facility] Pager- [phone] 12/04/2017

## 2017-12-14 ENCOUNTER — Telehealth: Payer: Self-pay | Admitting: *Deleted

## 2017-12-14 ENCOUNTER — Telehealth: Payer: Self-pay | Admitting: Cardiovascular Disease

## 2017-12-14 MED ORDER — METOPROLOL TARTRATE 25 MG PO TABS: 25.0000 mg | ORAL_TABLET | Freq: Two times a day (BID) | ORAL | 3 refills | Status: DC | PRN

## 2017-12-14 MED ORDER — METOPROLOL SUCCINATE ER 25 MG PO TB24: 12.5000 mg | ORAL_TABLET | Freq: Every day | ORAL | 3 refills | Status: DC

## 2017-12-14 NOTE — Telephone Encounter (Signed)
Pt metoprolol succinate 12.5mg  once a day sent in to [facility] order.

## 2017-12-14 NOTE — Telephone Encounter (Signed)
"  I'm getting my foot surgery done next Thursday, October 17 with Dr. Amalia Hailey.  I'm just wondering if they have a time set yet.  Thank you much."

## 2017-12-14 NOTE — Telephone Encounter (Signed)
Patient calling stating she would like to start on the Metoprolol   If we can please send this to her mail order.  She uses [facility] mail order  Please advise

## 2017-12-15 ENCOUNTER — Telehealth: Payer: Self-pay | Admitting: Cardiovascular Disease

## 2017-12-15 NOTE — Telephone Encounter (Signed)
[facility] calling  Pt c/o medication issue:  1. Name of Medication: Metoprolol   2. How are you currently taking this medication (dosage and times per day)?   3. Are you having a reaction (difficulty breathing--STAT)?   4. What is your medication issue?   They just need to know if patient is suppose to on both Succinate and the Tartrate   Please call back

## 2017-12-15 NOTE — Telephone Encounter (Signed)
She had prescription sent into pharmacy yesterday

## 2017-12-15 NOTE — Telephone Encounter (Signed)
I am returning your call.  "I actually called the surgical center and they told me I would need to be there at 8:30 am on Thursday, October 17."  Very good, thank you.

## 2017-12-15 NOTE — Telephone Encounter (Signed)
Please review for clarification.

## 2017-12-21 ENCOUNTER — Other Ambulatory Visit: Payer: Self-pay | Admitting: Podiatry

## 2017-12-21 ENCOUNTER — Encounter: Payer: Self-pay | Admitting: Podiatry

## 2017-12-21 DIAGNOSIS — M7751 Other enthesopathy of right foot: Secondary | ICD-10-CM | POA: Diagnosis not present

## 2017-12-21 DIAGNOSIS — M21541 Acquired clubfoot, right foot: Secondary | ICD-10-CM

## 2017-12-21 DIAGNOSIS — M2011 Hallux valgus (acquired), right foot: Secondary | ICD-10-CM

## 2017-12-21 DIAGNOSIS — M2041 Other hammer toe(s) (acquired), right foot: Secondary | ICD-10-CM | POA: Diagnosis not present

## 2017-12-21 MED ORDER — OXYCODONE-ACETAMINOPHEN 5-325 MG PO TABS: 1.0000 | ORAL_TABLET | Freq: Four times a day (QID) | ORAL | 0 refills | Status: DC | PRN

## 2017-12-21 NOTE — Progress Notes (Unsigned)
Postop pain management 

## 2017-12-22 ENCOUNTER — Telehealth: Payer: Self-pay | Admitting: Podiatry

## 2017-12-22 ENCOUNTER — Telehealth: Payer: Self-pay | Admitting: *Deleted

## 2017-12-22 NOTE — Telephone Encounter (Signed)
Pt said dressing on her foot is so tight. She is in a lot of pain.

## 2017-12-22 NOTE — Telephone Encounter (Signed)
I spoke with pt and asked if she had been able to get any pain relief. Pt states she had spoken to someone and they told her to loosen the ace wrap. Pt states it still hurts. I told pt to remove the boot, open-ended sock, ace wrap, then elevate the foot for 15 minutes, after 15 minutes lower foot to to hip level and reapply the ace beginning at the toes and rolling looser down the foot and up the leg, replace the sock and boot. I told pt the ace was applied to staunch any post op bleeding and hold out swelling. Pt's husband said there was damp blood to the dressing not dry. I told pt I would contact the [place] office and have them call to see if the dressing needed to be replaced or reinforced. Skyped A. Clifton James, LPN and routed message to Dr. Amalia Hailey and A. Venable.

## 2017-12-22 NOTE — Telephone Encounter (Signed)
I returned patient call, she stated that her dressing felt so tight and was very painful.  I instructed her to loosen the top layer ace bandage but don't remove it, keep her foot elevated and continue with ice and pain medication as directed.  I informed her that usually the 2nd and 3rd days are the worst.  She verbalized understanding

## 2017-12-22 NOTE — Telephone Encounter (Signed)
Pt states the dressing is too tight and she would like a call back today.

## 2017-12-26 ENCOUNTER — Ambulatory Visit (INDEPENDENT_AMBULATORY_CARE_PROVIDER_SITE_OTHER): Payer: Managed Care, Other (non HMO) | Admitting: Podiatry

## 2017-12-26 ENCOUNTER — Ambulatory Visit (INDEPENDENT_AMBULATORY_CARE_PROVIDER_SITE_OTHER): Payer: Managed Care, Other (non HMO)

## 2017-12-26 DIAGNOSIS — M2041 Other hammer toe(s) (acquired), right foot: Secondary | ICD-10-CM | POA: Diagnosis not present

## 2017-12-26 DIAGNOSIS — M21611 Bunion of right foot: Secondary | ICD-10-CM | POA: Diagnosis not present

## 2017-12-26 MED ORDER — DOXYCYCLINE HYCLATE 100 MG PO TABS: 100.0000 mg | ORAL_TABLET | Freq: Two times a day (BID) | ORAL | 0 refills | Status: DC

## 2017-12-26 MED ORDER — OXYCODONE-ACETAMINOPHEN 5-325 MG PO TABS: 1.0000 | ORAL_TABLET | Freq: Four times a day (QID) | ORAL | 0 refills | Status: DC | PRN

## 2017-12-29 ENCOUNTER — Encounter: Payer: Managed Care, Other (non HMO) | Admitting: Podiatry

## 2017-12-31 NOTE — Progress Notes (Signed)
   Subjective:  Patient presents today status post bunionectomy and hammertoe repair of the 2nd digit right foot. DOS: 12/21/17. She reports moderate pain of the foot. She has been using a wheelchair and staying off the foot. She has been taking Percocet for pain relief. There are no modifying factors noted. Patient is here for further evaluation and treatment.    Past Medical History:  Diagnosis Date  . Atrial fibrillation ([place])   . 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 Diabetes mellitus without complication ([place])   . Diverticulosis   . GERD (gastroesophageal reflux disease)   . History of colonic polyps    benign  . Hyperlipidemia   . Hypertension   . Hypothyroidism   . Lung infection   . Mitral valve prolapse   . Obstructive sleep apnea    on CPAP  . Sleep apnea   . Thyroid disease   . Ventricular tachycardia (paroxysmal) ([place])       Objective/Physical Exam Neurovascular status intact.  Skin incisions appear to be well coapted with sutures and staples intact. No sign of infectious process noted. No dehiscence. No active bleeding noted. Moderate edema noted to the surgical extremity.  Radiographic Exam:  Orthopedic hardware and osteotomies sites appear to be stable with routine healing.  Assessment: 1. s/p bunionectomy and hammertoe repair of the 2nd digit right. DOS: 12/21/17   Plan of Care:  1. Patient was evaluated. X-rays reviewed 2. Dressing changed. Keep clean, dry and intact for one week.  3. Patient can be weightbearing in CAM boot.  4. Prescription for Doxycycline provided to patient for prophylaxis.  5. Refill prescription for Percocet 5/325 mg provided to patient.  6. Return to clinic in one week.    Edrick Kins, DPM [facility]  Dr. Edrick Kins, [address]                                        [place], [place] [address]                Office [phone]  Fax [phone]

## 2018-01-02 ENCOUNTER — Telehealth: Payer: Self-pay

## 2018-01-02 NOTE — Telephone Encounter (Signed)
Spoke with pt and informed her of capsule study results and Dr. Georgeann Oppenheim instructions to refer pt to [facility] GI Procedures to address the findings recorded by the capsule. Pt agrees and is aware that [facility] will contact her to schedule.

## 2018-01-05 ENCOUNTER — Ambulatory Visit (INDEPENDENT_AMBULATORY_CARE_PROVIDER_SITE_OTHER): Payer: Managed Care, Other (non HMO) | Admitting: Podiatry

## 2018-01-05 ENCOUNTER — Encounter: Payer: Self-pay | Admitting: Podiatry

## 2018-01-05 DIAGNOSIS — M2011 Hallux valgus (acquired), right foot: Secondary | ICD-10-CM

## 2018-01-05 DIAGNOSIS — Z9889 Other specified postprocedural states: Secondary | ICD-10-CM

## 2018-01-05 DIAGNOSIS — M2041 Other hammer toe(s) (acquired), right foot: Secondary | ICD-10-CM

## 2018-01-06 ENCOUNTER — Other Ambulatory Visit: Payer: Self-pay | Admitting: Cardiovascular Disease

## 2018-01-08 NOTE — Progress Notes (Signed)
   Subjective:  Patient presents today status post bunionectomy and hammertoe repair of the 2nd digit right foot. DOS: 12/21/17. She states she is doing well overall. She denies any significant pain or modifying factors. Patient is here for further evaluation and treatment.    Past Medical History:  Diagnosis Date  . Atrial fibrillation ([place])   . 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 Diabetes mellitus without complication ([place])   . Diverticulosis   . GERD (gastroesophageal reflux disease)   . History of colonic polyps    benign  . Hyperlipidemia   . Hypertension   . Hypothyroidism   . Lung infection   . Mitral valve prolapse   . Obstructive sleep apnea    on CPAP  . Sleep apnea   . Thyroid disease   . Ventricular tachycardia (paroxysmal) ([place])       Objective/Physical Exam Neurovascular status intact.  Skin incisions appear to be well coapted with sutures and staples intact. No sign of infectious process noted. No dehiscence. No active bleeding noted. Moderate edema noted to the surgical extremity.  Assessment: 1. s/p bunionectomy and hammertoe repair of the 2nd digit right. DOS: 12/21/17   Plan of Care:  1. Patient was evaluated.  2. Staples removed. Dry sterile dressing applied.  3. Continue weightbearing in CAM boot.  4. Return to clinic in 2 weeks for pin removal.    Edrick Kins, DPM [facility]  Dr. Edrick Kins, [address]                                        [place], [place] [address]                Office [phone]  Fax [phone]

## 2018-01-16 ENCOUNTER — Ambulatory Visit: Payer: Managed Care, Other (non HMO) | Admitting: Primary Care

## 2018-01-19 ENCOUNTER — Ambulatory Visit (INDEPENDENT_AMBULATORY_CARE_PROVIDER_SITE_OTHER): Payer: Managed Care, Other (non HMO) | Admitting: Podiatry

## 2018-01-19 ENCOUNTER — Encounter: Payer: Self-pay | Admitting: Podiatry

## 2018-01-19 ENCOUNTER — Ambulatory Visit (INDEPENDENT_AMBULATORY_CARE_PROVIDER_SITE_OTHER): Payer: Managed Care, Other (non HMO)

## 2018-01-19 DIAGNOSIS — Z9889 Other specified postprocedural states: Secondary | ICD-10-CM

## 2018-01-19 DIAGNOSIS — M2041 Other hammer toe(s) (acquired), right foot: Secondary | ICD-10-CM | POA: Diagnosis not present

## 2018-01-19 DIAGNOSIS — M2011 Hallux valgus (acquired), right foot: Secondary | ICD-10-CM | POA: Diagnosis not present

## 2018-01-19 DIAGNOSIS — M21611 Bunion of right foot: Secondary | ICD-10-CM

## 2018-01-22 NOTE — Progress Notes (Signed)
   Subjective:  Patient presents today status post bunionectomy and hammertoe repair of the 2nd digit right foot. DOS: 12/21/17. She states she is doing well. She reports some mild tenderness with associated mild swelling. She has been using the CAM boot as directed. She denies any modifying factors. Patient is here for further evaluation and treatment.   Past Medical History:  Diagnosis Date  . Atrial fibrillation ([place])   . 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 Diabetes mellitus without complication ([place])   . Diverticulosis   . GERD (gastroesophageal reflux disease)   . History of colonic polyps    benign  . Hyperlipidemia   . Hypertension   . Hypothyroidism   . Lung infection   . Mitral valve prolapse   . Obstructive sleep apnea    on CPAP  . Sleep apnea   . Thyroid disease   . Ventricular tachycardia (paroxysmal) ([place])       Objective/Physical Exam Neurovascular status intact.  Skin incisions appear to be well coapted. No sign of infectious process noted. No dehiscence. No active bleeding noted. Moderate edema noted to the surgical extremity.  Radiographic Exam:  Orthopedic hardware and osteotomies sites appear to be stable with routine healing.  Assessment: 1. s/p bunionectomy and hammertoe repair of the 2nd digit right. DOS: 12/21/17   Plan of Care:  1. Patient was evaluated. X-Rays reviewed.  2. Percutaneous pins removed.  3. Post op shoe dispensed for use x 1 week. Discontinue using CAM boot.  4. Compression anklet dispensed.  5. Return to clinic in 4 weeks.    Edrick Kins, DPM [facility]  Dr. Edrick Kins, [address]                                        [place], [place] [address]                Office [phone]  Fax [phone]

## 2018-01-23 ENCOUNTER — Encounter: Payer: Self-pay | Admitting: Primary Care

## 2018-01-23 ENCOUNTER — Ambulatory Visit (INDEPENDENT_AMBULATORY_CARE_PROVIDER_SITE_OTHER): Payer: Managed Care, Other (non HMO) | Admitting: Primary Care

## 2018-01-23 VITALS — BP 120/76 | HR 63 | Temp 97.9°F | Ht 66.0 in | Wt 172.2 lb

## 2018-01-23 DIAGNOSIS — M8580 Other specified disorders of bone density and structure, unspecified site: Secondary | ICD-10-CM

## 2018-01-23 DIAGNOSIS — E1159 Type 2 diabetes mellitus with other circulatory complications: Secondary | ICD-10-CM

## 2018-01-23 LAB — POCT GLYCOSYLATED HEMOGLOBIN (HGB A1C): Hemoglobin A1C: 6.1 % — AB (ref 4.0–5.6)

## 2018-01-23 MED ORDER — IBANDRONATE SODIUM 150 MG PO TABS: ORAL_TABLET | ORAL | 3 refills | Status: DC

## 2018-01-23 NOTE — Patient Instructions (Signed)
Please schedule a physical with me in 6 months. You may also schedule a lab only appointment 3-4 days prior. We will discuss your lab results in detail during your physical.  Congratulations on your A1C!!! Great job!  Please schedule a nurse visit for your pneumonia vaccination once you've recovered.  Please call me Friday this week if your symptoms haven't improved.  It was a pleasure to see you today!

## 2018-01-23 NOTE — Assessment & Plan Note (Signed)
A1C of 6.1 today. Suspect this is highly diet related. Congratulated her on this improvement.  Continue current regimen.  Continue to work on diet.   Pneumonia vaccination due this year, she will return once URI symptoms have resolved.   Follow up in 6 months.

## 2018-01-23 NOTE — Progress Notes (Signed)
Subjective:    Patient ID: Debra Perez, female    DOB: 1955-08-25, 62 y.o.   MRN: [id number]  HPI  Debra Perez is a 62 year old female who presents today for follow up of type 2 diabetes.  Current medications include: Glipizide ER 5 mg, Janumet 50-1000 mg BID  She is checking her blood glucose 2 times daily and is getting readings of: AM fasting: 120-140 2 hours after dinner: 160/170  Lowest reading: 45 Highest reading: 180  Last A1C: 7.8 in June 2019 Last Eye Exam: Completed in April 2019 Last Foot Exam: Completed in May 2019 Pneumonia Vaccination: Completed in 2014 ACE/ARB: quinapril  Statin: rosuvastatin  Diet currently consists of:  Breakfast: Fruit and peanut butter Lunch: Veggies, peanut butter, veggie burger Dinner: Meat, vegetables  Snacks: Sugar free jello with sugar free whipped cream Desserts: 1-2 times weekly  Beverages: Water, coffee, un-sweet tea  Exercise: She is not exercising much now since her foot surgery.    She also reports a chief complaint of cough. She also reports chest congestion, post nasal drip, fatigue, chills. She's been taking Delsym and Claritin without much improvement. Her symptoms began one week ago, thinks she caught this from someone at church. She denies fevers.    Review of Systems  Respiratory: Negative for shortness of breath.   Cardiovascular: Negative for chest pain.  Neurological: Negative for dizziness and numbness.       Past Medical History:  Diagnosis Date  . Atrial fibrillation ([place])   . 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 Diabetes mellitus without complication ([place])   . Diverticulosis   . GERD (gastroesophageal reflux disease)   . History of colonic polyps    benign  . Hyperlipidemia   . Hypertension   . Hypothyroidism   . Lung infection   . Mitral valve prolapse   . Obstructive sleep apnea    on CPAP  . Sleep apnea   . Thyroid disease   . Ventricular tachycardia (paroxysmal) ([facility])       Social History   Socioeconomic History  . Marital status: Married    Spouse name: Not on file  . Number of children: Not on file  . Years of education: Not on file  . Highest education level: Not on file  Occupational History  . Occupation: retired  Scientific laboratory technician  . Financial resource strain: Not on file  . Food insecurity:    Worry: Not on file    Inability: Not on file  . Transportation needs:    Medical: Not on file    Non-medical: Not on file  Tobacco Use  . Smoking status: Former Smoker    Packs/day: 1.00    Years: 20.00    Pack years: 20.00    Types: Cigarettes    Last attempt to quit: 03/07/2004    Years since quitting: 13.8  . Smokeless tobacco: Never Used  Substance and Sexual Activity  . Alcohol use: No    Alcohol/week: 0.0 standard drinks  . Drug use: No  . Sexual activity: Not on file  Lifestyle  . Physical activity:    Days per week: Not on file    Minutes per session: Not on file  . Stress: Not on file  Relationships  . Social connections:    Talks on phone: Not on file    Gets together: Not on file    Attends religious service: Not on file    Active member of  club or organization: Not on file    Attends meetings of clubs or organizations: Not on file    Relationship status: Not on file  . Intimate partner violence:    Fear of current or ex partner: Not on file    Emotionally abused: Not on file    Physically abused: Not on file    Forced sexual activity: Not on file  Other Topics Concern  . Not on file  Social History Narrative   Married.   1 daughter.   Moved here from [place] due to husbands occupation.   She is a Corporate treasurer.   Enjoys sewing, gardening.    Past Surgical History:  Procedure Laterality Date  . ABDOMINAL HYSTERECTOMY  2002   ovaries remain  . CLAVICLE SURGERY  2007   right clavicle plate pins  . COLONOSCOPY WITH PROPOFOL N/A 10/17/2017   Procedure: COLONOSCOPY WITH PROPOFOL;  Surgeon: Jonathon Bellows, MD;  Location: [facility]  ENDOSCOPY;  Service: Gastroenterology;  Laterality: N/A;  . ESOPHAGOGASTRODUODENOSCOPY (EGD) WITH PROPOFOL N/A 10/17/2017   Procedure: ESOPHAGOGASTRODUODENOSCOPY (EGD) WITH PROPOFOL;  Surgeon: Jonathon Bellows, MD;  Location[facility] ENDOSCOPY;  Service: Gastroenterology;  Laterality: N/A;  . GIVENS CAPSULE STUDY N/A 11/30/2017   Procedure: GIVENS CAPSULE STUDY;  Surgeon: Jonathon Bellows, MD;  Location: [facility] ENDOSCOPY;  Service: Gastroenterology;  Laterality: N/A;  . KNEE ARTHROSCOPY  2012    Family History  Problem Relation Age of Onset  . Depression Mother   . Sudden death Mother 3  . Heart attack Mother   . Arthritis Father   . Heart disease Father   . Hypertension Father   . Emphysema Father   . Bladder Cancer Sister   . Diabetes Sister        Type 2  . Diabetes Brother        type  . Hypertension Brother   . Emphysema Brother   . Heart disease Brother   . Diabetes Maternal Grandfather   . Bladder Cancer Maternal Grandmother   . Breast cancer Paternal Grandmother   . Cancer Paternal Grandfather        colon cancer  . Diabetes Paternal Grandfather     No Known Allergies  Current Outpatient Medications on File Prior to Visit  Medication Sig Dispense Refill  . ACCU-CHEK FASTCLIX LANCETS MISC Use as instructed to test blood sugar 2 times daily 300 each 3  . [name] 50 MG tablet TAKE 1 TABLET BY MOUTH AT BEDTIME 90 tablet 1  . apixaban (ELIQUIS) 5 MG TABS tablet Take one tablet twice a day    . Azelastine-Fluticasone (DYMISTA) 137-50 MCG/ACT SUSP Dymista 137 mcg-50 mcg/spray nasal spray    . b complex vitamins capsule Take 1 capsule by mouth 2 (two) times daily.    . cyclobenzaprine (FLEXERIL) 5 MG tablet TAKE 1 TABLET BY MOUTH AT BEDTIME AS NEEDED FOR MUSCLE SPASMS 90 tablet 0  . diclofenac sodium (VOLTAREN) 1 % GEL Apply 2 g topically 4 (four) times daily. (Patient taking differently: Apply 2 g topically 4 (four) times daily as needed. ) 100 g 0  . ezetimibe (ZETIA) 10  MG tablet TAKE 1 TABLET BY MOUTH DAILY GENERIC EQUIVALENT FOR ZETIA 90 tablet 2  . fluticasone (FLONASE) 50 MCG/ACT nasal spray fluticasone propionate 50 mcg/actuation nasal spray,suspension PRN    . glipiZIDE (GLUCOTROL XL) 5 MG 24 hr tablet Take 1 tablet (5 mg total) by mouth daily with breakfast. 90 tablet 1  . glucose blood (ACCU-CHEK GUIDE) test strip Use  to test blood sugar up to 2 times daily 300 each 1  . ipratropium (ATROVENT HFA) 17 MCG/ACT inhaler Inhale 2 puffs into the lungs every 4 (four) hours as needed for wheezing. Wheezing and tight chest 1 Inhaler 0  . JANUMET 50-1000 MG tablet TAKE 1 TABLET BY MOUTH TWICE DAILY WITH A MEAL 180 tablet 1  . levothyroxine (SYNTHROID, LEVOTHROID) 88 MCG tablet TAKE 1 TABLET BY MOUTH EVERY DAY BEFORE BREAKFAST 90 tablet 1  . Magnesium 500 MG CAPS Take 1 capsule by mouth 2 (two) times daily.    . metoprolol succinate (TOPROL XL) 25 MG 24 hr tablet Take 0.5 tablets (12.5 mg total) by mouth daily. 45 tablet 3  . metoprolol tartrate (LOPRESSOR) 25 MG tablet Take 1 tablet (25 mg total) by mouth 2 (two) times daily as needed. 180 tablet 3  . metroNIDAZOLE (METROGEL) 1 % gel metronidazole 1 % topical gel    . Multiple Vitamins-Minerals (MULTIVITAMIN ADULT PO) Take 1 capsule by mouth 2 (two) times daily.     [place] Kitchen oxyCODONE-acetaminophen (PERCOCET) 5-325 MG tablet Take 1 tablet by mouth every 6 (six) hours as needed for severe pain. 30 tablet 0  . predniSONE (DELTASONE) 10 MG tablet Take 10 mg by mouth as needed.   0  . propafenone (RYTHMOL SR) 225 MG 12 hr capsule Take 1 capsule (225 mg total) by mouth 2 (two) times daily. 180 capsule 3  . quinapril (ACCUPRIL) 20 MG tablet TAKE 1 TABLET BY MOUTH AT BEDTIME. GENERIC EQUIVALENT FOR ACCUPRIL. 90 tablet 3  . ranitidine (ZANTAC) 150 MG tablet Take 1 tablet (150 mg total) by mouth 2 (two) times daily. 180 tablet 2  . rosuvastatin (CRESTOR) 10 MG tablet Take 10 mg by mouth daily.    . rosuvastatin (CRESTOR) 5 MG  tablet TAKE 1 TABLET BY MOUTH DAILY. GENERIC EQUIVALENT FOR CRESTOR. FOLLOW UP APPOINTMENT NEEDED FOR FURTHER REFILLS 90 tablet 2  . venlafaxine XR (EFFEXOR-XR) 37.5 MG 24 hr capsule TAKE ONE CAPSULE BY MOUTH DAILY WITH BREAKFAST 90 capsule 2  . verapamil (CALAN-SR) 120 MG CR tablet Take 1 tablet (120 mg total) by mouth at bedtime. (Patient taking differently: Take 120 mg by mouth daily. ) 90 tablet 3   Current Facility-Administered Medications on File Prior to Visit  Medication Dose Route Frequency Provider Last Rate Last Dose  . heparin lock flush 100 unit/mL  500 Units Intracatheter Once PRN Earlie Server, MD      . sodium chloride flush (NS) 0.9 % injection 10 mL  10 mL Intracatheter PRN Earlie Server, MD   10 mL at 10/06/17 1515    BP 120/76   Pulse 63   Temp 97.9 F (36.6 C) (Oral)   Ht 5\' 6"  (1.676 m)   Wt 172 lb 4 oz (78.1 kg)   SpO2 97%   BMI 27.80 kg/m    Objective:   Physical Exam  Constitutional: She appears well-nourished. She does not appear ill.  HENT:  Right Ear: Tympanic membrane and ear canal normal.  Left Ear: Tympanic membrane and ear canal normal.  Nose: Mucosal edema present. Right sinus exhibits no maxillary sinus tenderness and no frontal sinus tenderness. Left sinus exhibits no maxillary sinus tenderness and no frontal sinus tenderness.  Mouth/Throat: Oropharynx is clear and moist.  Neck: Neck supple.  Cardiovascular: Normal rate and regular rhythm.  Respiratory: Effort normal and breath sounds normal. She has no wheezes.  Skin: Skin is warm and dry.  Assessment & Plan:  URI:  Cough, congestion x 7 days, overall feeling about the same. Exam today overall unremarkable. Do suspect viral involvement at this point. Will have her monitor symptoms and call Friday this week if no improvement.  Pleas [name], NP

## 2018-01-26 DIAGNOSIS — J069 Acute upper respiratory infection, unspecified: Secondary | ICD-10-CM

## 2018-01-26 MED ORDER — AMOXICILLIN 875 MG PO TABS: 875.0000 mg | ORAL_TABLET | Freq: Two times a day (BID) | ORAL | 0 refills | Status: DC

## 2018-02-16 NOTE — Progress Notes (Signed)
DOS  12/21/2017  Bunionectomy with metatarsal osteotomy right. Hammertoe repair with capsulotomy second toe right.

## 2018-02-18 ENCOUNTER — Other Ambulatory Visit: Payer: Self-pay | Admitting: Primary Care

## 2018-02-18 ENCOUNTER — Other Ambulatory Visit: Payer: Self-pay | Admitting: Cardiovascular Disease

## 2018-02-18 DIAGNOSIS — E1159 Type 2 diabetes mellitus with other circulatory complications: Secondary | ICD-10-CM

## 2018-02-23 ENCOUNTER — Other Ambulatory Visit: Payer: Self-pay | Admitting: Cardiovascular Disease

## 2018-02-23 NOTE — Telephone Encounter (Signed)
Please review for refill. Thanks!  

## 2018-02-23 NOTE — Telephone Encounter (Signed)
Pt last saw Dr Rockey Situ 09/27/17, last labs 08/24/17 Creat 0.80, age 62, weight 78.1kg, based on specified criteria pt is on appropriate dosage of Eliquis 5mg  BID.  Will refill rx.

## 2018-03-12 ENCOUNTER — Other Ambulatory Visit: Payer: Self-pay | Admitting: Cardiovascular Disease

## 2018-03-12 ENCOUNTER — Telehealth: Payer: Self-pay | Admitting: Cardiovascular Disease

## 2018-03-12 NOTE — Telephone Encounter (Signed)
Spoke with patient. States she's had the PVCs and started the metoprolol as recommended by Dr Rockey Situ in October. She thought it would help but she is having them frequently still. She does not feel them as much during the day but tends to feel them when trying to rest at night. She is scheduled to see Ryan on 03/15/2018. She does not feel like it is urgent and will call us if anything worsens.

## 2018-03-12 NOTE — Telephone Encounter (Signed)
Patient c/o Palpitations:  High priority if patient c/o lightheadedness, shortness of breath, or chest pain  1) How long have you had palpitations/irregular HR/ Afib? Are you having the symptoms now? Chronic but has started again in the last month   2) Are you currently experiencing lightheadedness, SOB or CP? No   3) Do you have a history of afib (atrial fibrillation) or irregular heart rhythm? Yes   4) Have you checked your BP or HR? (document readings if available): 70-90 HR  5) Are you experiencing any other symptoms?  No   Scheduled with Rd on 1/9

## 2018-03-13 ENCOUNTER — Encounter: Payer: Self-pay | Admitting: Podiatry

## 2018-03-13 ENCOUNTER — Ambulatory Visit (INDEPENDENT_AMBULATORY_CARE_PROVIDER_SITE_OTHER): Payer: Managed Care, Other (non HMO) | Admitting: Podiatry

## 2018-03-13 ENCOUNTER — Ambulatory Visit (INDEPENDENT_AMBULATORY_CARE_PROVIDER_SITE_OTHER): Payer: Managed Care, Other (non HMO)

## 2018-03-13 DIAGNOSIS — M2041 Other hammer toe(s) (acquired), right foot: Secondary | ICD-10-CM

## 2018-03-13 DIAGNOSIS — Z9889 Other specified postprocedural states: Secondary | ICD-10-CM

## 2018-03-13 DIAGNOSIS — M2011 Hallux valgus (acquired), right foot: Secondary | ICD-10-CM

## 2018-03-14 NOTE — Progress Notes (Signed)
Cardiology Office Note Date:  03/15/2018  Patient ID:  Debra Perez, DOB 12/03/1955, MRN [id number] PCP:  Pleas [name], NP  Cardiologist:  Dr. Rockey Situ, MD    Chief Complaint: Palpitations  History of Present Illness: Debra Perez is a 63 y.o. female with history of CAD medically managed as below, PAF on Eliquis, NSVT (details unclear), ectatic ascending aorta measuring 3.72 cm in 2016, prior tobacco abuse for 20 years stopping greater than 13 years prior, DM2, anemia requiring iron infusions, hypertension, hyperlipidemia, hypothyroidism, sleep apnea on CPAP, and GERD who presents for evaluation of palpitations.  Remote stress testing from 2005 in [place] showed no significant ischemia or prior MI, normal LV size and function with an EF of 80%, adequate exercise tolerance, good blood pressure and heart rate response to exercise.  Normal study.  Cardiac CT from 2016 showed an EF of 73%, ectatic ascending aorta measuring 3.72 cm in caliber, normal caliber of the descending segment, three-vessel CAD with mild multifocal calcific narrowing of the proximal LAD and proximal ramus intermedius.  Mild calcific narrowing in the proximal RCA with minimal narrowing in the middle third.  No critical or flow-limiting disease was identified.  Most recent echo from 2016 was technically limited due to poor acoustic windows with an EF of 60 to 65%, mild concentric LVH, diastolic dysfunction, left atrium upper limits of normal, mild posterior MVP, trace mitral regurgitation, RVSP 27 mmHg, no pericardial effusion.  Patient has previously attributed her A. fib to a URI while on steroids and albuterol.  She has previously not tolerated scheduled beta-blockers secondary to fatigue and reported weight gain.  Multaq led to hair loss.  She was last seen in the office on 09/27/2017 and was maintaining sinus rhythm.  She has been maintained on Rythmol, Eliquis, and PRN metoprolol.  In 11/2017 the patient was noting more  palpitations.  She was advised to take metoprolol scheduled rather than as needed.  Patient called the office on 03/12/2026 noting increase in nocturnal palpitations over the past month.  Labs: 01/2018 - A1c 6.1 CBC from 11/2017 unable to be reviewed 09/2017 - Hgb 12.8, TSH low at 0.437 08/2017 - potassium 4.0, serum creatinine 0.8, LFT normal, LDL 46  Patient comes in today noting an increase and nocturnal palpitations that dates back to late August, 2019.  When she first began to note the increase in palpitations she contacted our office as above and was started on Toprol-XL 12.5 mg daily.  In this setting, she has not noted any significant improvement in her nocturnal palpitations.  During the day, when she is active she is asymptomatic.  She does feel like her fatigue has increased since resuming a beta-blocker.  She has been taking PRN Lopressor 25 mg about every other day since late August without significant improvement.  There is some associated shortness of breath with these palpitations.  No chest pain, dizziness, presyncope, or syncope.  No lower extremity swelling, abdominal distention, orthopnea, PND, or early satiety.  No falls since she was last seen.  No BRBPR or melena.  Blood pressure at home has been ranging from the 536U to 440H systolic.  She denies any recent injury, illness, or increased stress.  Diet has remained the same.  Patient's Apple watch rhythm strip shows NSR, BBB, occasional PACs/PVCs.   Past Medical History:  Diagnosis Date  . 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 Diabetes mellitus without complication ([place])   . Diverticulosis   .  GERD (gastroesophageal reflux disease)   . History of colonic polyps    benign  . Hyperlipidemia   . Hypertension   . Hypothyroidism   . Lung infection   . Mitral valve prolapse   . NSVT (nonsustained ventricular tachycardia) ([place])   . Obstructive sleep apnea    on CPAP  . PAF (paroxysmal atrial fibrillation) ([place])    a.   On Eliquis; b. CHADS2VASc => 3 (HTN, DM, female)  . Sleep apnea     Past Surgical History:  Procedure Laterality Date  . ABDOMINAL HYSTERECTOMY  2002   ovaries remain  . CLAVICLE SURGERY  2007   right clavicle plate pins  . COLONOSCOPY WITH PROPOFOL N/A 10/17/2017   Procedure: COLONOSCOPY WITH PROPOFOL;  Surgeon: Jonathon Bellows, MD;  Location: [facility] ENDOSCOPY;  Service: Gastroenterology;  Laterality: N/A;  . ESOPHAGOGASTRODUODENOSCOPY (EGD) WITH PROPOFOL N/A 10/17/2017   Procedure: ESOPHAGOGASTRODUODENOSCOPY (EGD) WITH PROPOFOL;  Surgeon: Jonathon Bellows, MD;  Location: [facility] ENDOSCOPY;  Service: Gastroenterology;  Laterality: N/A;  . GIVENS CAPSULE STUDY N/A 11/30/2017   Procedure: GIVENS CAPSULE STUDY;  Surgeon: Jonathon Bellows, MD;  Location: [facility] ENDOSCOPY;  Service: Gastroenterology;  Laterality: N/A;  . KNEE ARTHROSCOPY  2012    Current Meds  Medication Sig  . ACCU-CHEK FASTCLIX LANCETS MISC Use as instructed to test blood sugar 2 times daily  . [name] 50 MG tablet TAKE 1 TABLET BY MOUTH AT BEDTIME  . Azelastine-Fluticasone (DYMISTA) 137-50 MCG/ACT SUSP Dymista 137 mcg-50 mcg/spray nasal spray  . b complex vitamins capsule Take 1 capsule by mouth 2 (two) times daily.  . cyclobenzaprine (FLEXERIL) 5 MG tablet TAKE 1 TABLET BY MOUTH AT BEDTIME AS NEEDED FOR MUSCLE SPASMS  . diclofenac sodium (VOLTAREN) 1 % GEL Apply 2 g topically 4 (four) times daily. (Patient taking differently: Apply 2 g topically 4 (four) times daily as needed. )  . ELIQUIS 5 MG TABS tablet TAKE 1 TABLET BY MOUTH TWICE DAILY  . ezetimibe (ZETIA) 10 MG tablet TAKE 1 TABLET BY MOUTH DAILY. GENERIC EQUIVALENT FOR ZETIA.  . fluticasone (FLONASE) 50 MCG/ACT nasal spray fluticasone propionate 50 mcg/actuation nasal spray,suspension PRN  . glipiZIDE (GLUCOTROL XL) 5 MG 24 hr tablet TAKE 1 TABLET BY MOUTH DAILY WITH BREAKFAST  . glucose blood (ACCU-CHEK GUIDE) test strip Use to test blood sugar up to 2 times daily  .  ibandronate (BONIVA) 150 MG tablet Take one tablet by mouth once monthly.Take in morning with water, on an empty stomach, without medications, food, or lying down for 30 min.  Marland Kitchen JANUMET 50-1000 MG tablet TAKE 1 TABLET BY MOUTH TWICE DAILY WITH A MEAL  . levothyroxine (SYNTHROID, LEVOTHROID) 88 MCG tablet TAKE 1 TABLET BY MOUTH EVERY DAY BEFORE BREAKFAST  . Magnesium 500 MG CAPS Take 1 capsule by mouth 2 (two) times daily.  . metoprolol tartrate (LOPRESSOR) 25 MG tablet Take 1 tablet (25 mg total) by mouth 2 (two) times daily as needed.  . metroNIDAZOLE (METROGEL) 1 % gel metronidazole 1 % topical gel  . Multiple Vitamins-Minerals (MULTIVITAMIN ADULT PO) Take 1 capsule by mouth 2 (two) times daily.   . predniSONE (DELTASONE) 10 MG tablet Take 10 mg by mouth as needed.   . propafenone (RYTHMOL SR) 225 MG 12 hr capsule Take 1 capsule (225 mg total) by mouth 2 (two) times daily.  . quinapril (ACCUPRIL) 20 MG tablet TAKE 1 TABLET BY MOUTH AT BEDTIME. GENERIC EQUIVALENT FOR ACCUPRIL.  Marland Kitchen ranitidine (ZANTAC) 150 MG tablet Take 1 tablet (  150 mg total) by mouth 2 (two) times daily.  . rosuvastatin (CRESTOR) 5 MG tablet TAKE 1 TABLET BY MOUTH DAILY. GENERIC EQUIVALENT FOR CRESTOR. FOLLOW UP APPOINTMENT NEEDED FOR FURTHER REFILLS  . venlafaxine XR (EFFEXOR-XR) 37.5 MG 24 hr capsule TAKE ONE CAPSULE BY MOUTH DAILY WITH BREAKFAST  . verapamil (CALAN-SR) 120 MG CR tablet Take 1.5 tablets (180 mg total) by mouth at bedtime.  . [DISCONTINUED] verapamil (CALAN-SR) 120 MG CR tablet TAKE 1 TABLET (120 MG TOTAL) BY MOUTH AT BEDTIME.    Allergies:   Patient has no known allergies.   Social History:  The patient  reports that she quit smoking about 14 years ago. Her smoking use included cigarettes. She has a 20.00 pack-year smoking history. She has never used smokeless tobacco. She reports that she does not drink alcohol or use drugs.   Family History:  The patient's family history includes Arthritis in her father;  Bladder Cancer in her maternal grandmother and sister; Breast cancer in her paternal grandmother; Cancer in her paternal grandfather; Depression in her mother; Diabetes in her brother, maternal grandfather, paternal grandfather, and sister; Emphysema in her brother and father; Heart attack in her mother; Heart disease in her brother and father; Hypertension in her brother and father; Sudden death (age of onset: 45) in her mother.  ROS:   Review of Systems  Constitutional: Positive for malaise/fatigue. Negative for chills, diaphoresis, fever and weight loss.  HENT: Negative for congestion.   Eyes: Negative for discharge and redness.  Respiratory: Positive for shortness of breath. Negative for cough, hemoptysis, sputum production and wheezing.   Cardiovascular: Positive for palpitations. Negative for chest pain, orthopnea, claudication, leg swelling and PND.  Gastrointestinal: Negative for abdominal pain, blood in stool, heartburn, melena, nausea and vomiting.  Genitourinary: Negative for hematuria.  Musculoskeletal: Negative for falls and myalgias.  Skin: Negative for rash.  Neurological: Negative for dizziness, tingling, tremors, sensory change, speech change, focal weakness, loss of consciousness and weakness.  Endo/Heme/Allergies: Does not bruise/bleed easily.  Psychiatric/Behavioral: Negative for substance abuse. The patient is not nervous/anxious.   All other systems reviewed and are negative.    PHYSICAL EXAM:  VS:  BP (!) 152/90 (BP Location: Left Arm, Patient Position: Sitting, Cuff Size: Normal)   Pulse 69   Ht 5\' 4"  (1.626 m)   Wt 175 lb (79.4 kg)   BMI 30.04 kg/m  BMI: Body mass index is 30.04 kg/m.  Physical Exam  Constitutional: She is oriented to person, place, and time. She appears well-developed and well-nourished.  HENT:  Head: Normocephalic and atraumatic.  Eyes: Right eye exhibits no discharge. Left eye exhibits no discharge.  Neck: Normal range of motion. No JVD  present.  Cardiovascular: Normal rate, regular rhythm, S1 normal, S2 normal and normal heart sounds. Exam reveals no distant heart sounds, no friction rub, no midsystolic click and no opening snap.  No murmur heard. Pulses:      Posterior tibial pulses are 2+ on the right side and 2+ on the left side.  Occasional extrasystoles noted.  Pulmonary/Chest: Effort normal and breath sounds normal. No respiratory distress. She has no decreased breath sounds. She has no wheezes. She has no rales. She exhibits no tenderness.  Abdominal: Soft. She exhibits no distension. There is no abdominal tenderness.  Musculoskeletal:        General: No edema.  Neurological: She is alert and oriented to person, place, and time.  Skin: Skin is warm and dry. No cyanosis. Nails show no  clubbing.  Psychiatric: She has a normal mood and affect. Her speech is normal and behavior is normal. Judgment and thought content normal.     EKG:  Was ordered and interpreted by me today. Shows NSR, 69 bpm, rare PAC, RBBB. Rhythm strip shows an isolated PAC and PVC  Recent Labs: 08/24/2017: ALT 27; BUN 15; Creatinine, Ser 0.80; Potassium 4.0; Sodium 139 09/04/2017: TSH 0.437 10/04/2017: Hemoglobin 12.8; Platelets 365  08/24/2017: Cholesterol 123; HDL 61.40; LDL Cholesterol 46; Total CHOL/HDL Ratio 2; Triglycerides 78.0; VLDL 15.6   CrCl cannot be calculated (Patient's most recent lab result is older than the maximum 21 days allowed.).   Wt Readings from Last 3 Encounters:  03/15/18 175 lb (79.4 kg)  01/23/18 172 lb 4 oz (78.1 kg)  12/04/17 172 lb 14.4 oz (78.4 kg)     Other studies reviewed: Additional studies/records reviewed today include: summarized above  ASSESSMENT AND PLAN:  1. Palpitations/PACs/PVCs: Patient's Apple Watch rhythm strip shows occasional PVCs and PVCs.  EKG and rhythm strip in the office today show 2 PACs and 1 PVC.  Schedule ZIO to evaluate for ectopy burden and for significant arrhythmia.  Check echo to  evaluate for any structural changes.  Check TSH, magnesium, CBC, and BMP.  Increase verapamil to 180 mg daily.  Continue Toprol-XL 12.5 mg daily and PRN Lopressor 25 mg.  2. PAF: Maintaining sinus rhythm with Rythmol.  Schedule outpatient cardiac monitoring as above.  Continue Toprol and verapamil as outlined above.  Continue Eliquis given elevated CHADS2VASc as above.  3. CAD involving the native coronary arteries without angina: No symptoms concerning for angina.  Check echocardiogram to evaluate for structural changes as outlined above.  On Eliquis in place of aspirin.  Remains on metoprolol and Crestor.  Continue risk factor modification.  4. Ectatic ascending thoracic aorta: Incidentally noted on CTA of the chest in 2016.  Check echocardiogram as above to trend.  Optimal blood pressure control is recommended.  Verapamil has been titrated as below.  5. Shortness of breath/fatigue: Check echocardiogram and labs as above.  If echo demonstrates a new cardiomyopathy would pursue ischemic evaluation.  6. Hypertension: Suboptimally controlled today however blood pressure readings at home have typically been in the 161W to 960A systolic.  Increase verapamil to 180 mg daily.  She will otherwise remain on Toprol-XL and Accupril.  7. Hyperlipidemia: LDL of 46 from 08/2017.  Remains on Crestor and Zetia.   Disposition: F/u with Dr. Rockey Situ or an APP in 6 weeks.  Current medicines are reviewed at length with the patient today.  The patient did not have any concerns regarding medicines.  Signed, Christell Faith, PA-C 03/15/2018 9:06 AM     [facility] [address] [place], [place] [address] [phone]

## 2018-03-15 ENCOUNTER — Ambulatory Visit (INDEPENDENT_AMBULATORY_CARE_PROVIDER_SITE_OTHER): Payer: Managed Care, Other (non HMO) | Admitting: Physician Assistant

## 2018-03-15 ENCOUNTER — Encounter: Payer: Self-pay | Admitting: Physician Assistant

## 2018-03-15 ENCOUNTER — Ambulatory Visit (INDEPENDENT_AMBULATORY_CARE_PROVIDER_SITE_OTHER): Payer: Managed Care, Other (non HMO)

## 2018-03-15 VITALS — BP 152/90 | HR 69 | Ht 64.0 in | Wt 175.0 lb

## 2018-03-15 DIAGNOSIS — E782 Mixed hyperlipidemia: Secondary | ICD-10-CM

## 2018-03-15 DIAGNOSIS — R002 Palpitations: Secondary | ICD-10-CM

## 2018-03-15 DIAGNOSIS — I480 Paroxysmal atrial fibrillation: Secondary | ICD-10-CM

## 2018-03-15 DIAGNOSIS — I2510 Atherosclerotic heart disease of native coronary artery without angina pectoris: Secondary | ICD-10-CM | POA: Diagnosis not present

## 2018-03-15 DIAGNOSIS — I77819 Aortic ectasia, unspecified site: Secondary | ICD-10-CM

## 2018-03-15 DIAGNOSIS — R0602 Shortness of breath: Secondary | ICD-10-CM | POA: Diagnosis not present

## 2018-03-15 DIAGNOSIS — I10 Essential (primary) hypertension: Secondary | ICD-10-CM

## 2018-03-15 DIAGNOSIS — R5383 Other fatigue: Secondary | ICD-10-CM | POA: Diagnosis not present

## 2018-03-15 MED ORDER — VERAPAMIL HCL ER 120 MG PO TBCR: 180.0000 mg | EXTENDED_RELEASE_TABLET | Freq: Every day | ORAL | 6 refills | Status: DC

## 2018-03-15 NOTE — Progress Notes (Signed)
   Subjective:  Patient presents today status post bunionectomy and hammertoe repair of the 2nd digit right foot. DOS: 12/21/17. She states she is doing well. She reports some intermittent aching but states it is better than it was. She reports some associated swelling. She denies modifying factors. Patient is here for further evaluation and treatment.   Past Medical History:  Diagnosis Date  . 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 Diabetes mellitus without complication ([place])   . Diverticulosis   . GERD (gastroesophageal reflux disease)   . History of colonic polyps    benign  . Hyperlipidemia   . Hypertension   . Hypothyroidism   . Lung infection   . Mitral valve prolapse   . NSVT (nonsustained ventricular tachycardia) ([place])   . Obstructive sleep apnea    on CPAP  . PAF (paroxysmal atrial fibrillation) ([place])    a.  On Eliquis; b. CHADS2VASc => 3 (HTN, DM, female)  . Sleep apnea       Objective/Physical Exam Neurovascular status intact.  Skin incisions appear to be well coapted. No sign of infectious process noted. No dehiscence. No active bleeding noted. Moderate edema noted to the surgical extremity.  Radiographic Exam:  Orthopedic hardware and osteotomies sites appear to be stable with routine healing.  Assessment: 1. s/p bunionectomy and hammertoe repair of the 2nd digit right. DOS: 12/21/17   Plan of Care:  1. Patient was evaluated. X-Rays reviewed.  2. May resume full activity with no restrictions.  3. Recommended good shoe gear.  4. Continue ROM exercises.  5. Continue using compression socks as needed.  6. Return to clinic as needed.    Edrick Kins, DPM [facility]  Dr. Edrick Kins, [address]                                        [place], [place] [address]                Office [phone]  Fax [phone]

## 2018-03-15 NOTE — Patient Instructions (Addendum)
Medication Instructions:  Your physician has recommended you make the following change in your medication:  1- Verapamil INCREASE to : Take 1.5 tablets (180 mg total) by mouth at bedtime  If you need a refill on your cardiac medications before your next appointment, please call your pharmacy.   Lab work: Your physician recommends that you return for lab work today (TSH, Mag, CBC, BMET)  If you have labs (blood work) drawn today and your tests are completely normal, you will receive your results only by: Marland Kitchen MyChart Message (if you have MyChart) OR . A paper copy in the mail If you have any lab test that is abnormal or we need to change your treatment, we will call you to review the results.  Testing/Procedures: 1- Your physician has requested that you have an echocardiogram. Echocardiography is a painless test that uses sound waves to create images of your heart. It provides your doctor with information about the size and shape of your heart and how well your heart's chambers and valves are working. This procedure takes approximately one hour. There are no restrictions for this procedure.  2- Zio Monitor A zio monitor was placed today. It will remain on for 14 days. You will then return monitor and event diary in provided box. It takes 1-2 weeks for report to be downloaded and returned to [place]. We will call you with the results. If monitor falls of or has orange flashing light, please call Zio for further instructions.  ID# [id number]  Follow-Up: At [facility], you and your health needs are our priority.  As part of our continuing mission to provide you with exceptional heart care, we have created designated Provider Care Teams.  These Care Teams include your primary Cardiologist (physician) and Advanced Practice Providers (APPs -  Physician Assistants and Nurse Practitioners) who all work together to provide you with the care you need, when you need it. You will need a follow up appointment in  6 weeks. You may see Dr. Rockey Situ or one of the following Advanced Practice Providers on your designated Care Team:   Murray Hodgkins, NP Christell Faith, PA-C . Marrianne Mood, PA-C

## 2018-03-16 LAB — CBC
HEMATOCRIT: 42.6 % (ref 34.0–46.6)
Hemoglobin: 14.5 g/dL (ref 11.1–15.9)
MCH: 31.7 pg (ref 26.6–33.0)
MCHC: 34 g/dL (ref 31.5–35.7)
MCV: 93 fL (ref 79–97)
Platelets: 289 10*3/uL (ref 150–450)
RBC: 4.58 x10E6/uL (ref 3.77–5.28)
RDW: 13.7 % (ref 11.7–15.4)
WBC: 6.5 10*3/uL (ref 3.4–10.8)

## 2018-03-16 LAB — BASIC METABOLIC PANEL
BUN/Creatinine Ratio: 18 (ref 12–28)
BUN: 12 mg/dL (ref 8–27)
CO2: 26 mmol/L (ref 20–29)
Calcium: 9.5 mg/dL (ref 8.7–10.3)
Chloride: 101 mmol/L (ref 96–106)
Creatinine, Ser: 0.66 mg/dL (ref 0.57–1.00)
GFR, EST AFRICAN AMERICAN: 111 mL/min/{1.73_m2} (ref >59)
GFR, EST NON AFRICAN AMERICAN: 96 mL/min/{1.73_m2} (ref >59)
Glucose: 106 mg/dL — ABNORMAL HIGH (ref 65–99)
Potassium: 4.8 mmol/L (ref 3.5–5.2)
Sodium: 142 mmol/L (ref 134–144)

## 2018-03-16 LAB — TSH: TSH: 1.4 u[IU]/mL (ref 0.450–4.500)

## 2018-03-16 LAB — MAGNESIUM: MAGNESIUM: 2.2 mg/dL (ref 1.6–2.3)

## 2018-03-31 ENCOUNTER — Other Ambulatory Visit: Payer: Self-pay | Admitting: Primary Care

## 2018-03-31 DIAGNOSIS — E039 Hypothyroidism, unspecified: Secondary | ICD-10-CM

## 2018-04-03 NOTE — Telephone Encounter (Signed)
Last prescribed on 10/17/2017. Last office visit on 01/23/2018. Last TSH on 03/15/2018 at cardiology.  Next future appointment on 07/24/2018.

## 2018-04-03 NOTE — Telephone Encounter (Signed)
Noted, refill sent to pharmacy. 

## 2018-04-06 ENCOUNTER — Ambulatory Visit (INDEPENDENT_AMBULATORY_CARE_PROVIDER_SITE_OTHER): Payer: Managed Care, Other (non HMO)

## 2018-04-06 ENCOUNTER — Other Ambulatory Visit: Payer: Self-pay | Admitting: Oncology

## 2018-04-06 DIAGNOSIS — R0602 Shortness of breath: Secondary | ICD-10-CM

## 2018-04-09 ENCOUNTER — Encounter: Payer: Self-pay | Admitting: Oncology

## 2018-04-09 ENCOUNTER — Inpatient Hospital Stay: Payer: Managed Care, Other (non HMO) | Attending: Oncology | Admitting: Oncology

## 2018-04-09 ENCOUNTER — Inpatient Hospital Stay: Payer: Managed Care, Other (non HMO)

## 2018-04-09 ENCOUNTER — Other Ambulatory Visit: Payer: Self-pay

## 2018-04-09 VITALS — BP 121/75 | HR 98 | Temp 97.9°F | Resp 18 | Wt 178.9 lb

## 2018-04-09 DIAGNOSIS — Z87891 Personal history of nicotine dependence: Secondary | ICD-10-CM | POA: Insufficient documentation

## 2018-04-09 DIAGNOSIS — Q2739 Arteriovenous malformation, other site: Secondary | ICD-10-CM | POA: Diagnosis not present

## 2018-04-09 DIAGNOSIS — Z7901 Long term (current) use of anticoagulants: Secondary | ICD-10-CM | POA: Diagnosis not present

## 2018-04-09 DIAGNOSIS — D509 Iron deficiency anemia, unspecified: Secondary | ICD-10-CM | POA: Diagnosis not present

## 2018-04-09 DIAGNOSIS — I4891 Unspecified atrial fibrillation: Secondary | ICD-10-CM | POA: Insufficient documentation

## 2018-04-09 NOTE — Progress Notes (Signed)
Hematology/Oncology Consult note [facility] [phone] [phone]   Patient Care Team: Pleas [name], NP as PCP - General (Nurse Practitioner) Minna Merritts, MD as Consulting Physician (Cardiology)  REFERRING PROVIDER: Napoleon Form CHIEF COMPLAINTS/REASON FOR VISIT:  Evaluation of iron deficiency  HISTORY OF PRESENTING ILLNESS:  Debra Perez is a  63 y.o.  female with PMH listed below who was referred to me for evaluation of iron deficiency.  Patient has a history of atrial fibrillation,  is on chronic anticoagulation with Eliquis 5 mg twice daily. Patient had iron panel done on 09/04/2017 at PCPs office.  Reviewed patient's labs.  Iron 35, TIBC 406, ferritin 7, iron saturation 9.  Patient reports history of iron deficiency in the past.  She used to take an oral iron supplements but had GI side effects.  Currently not on any oral iron supplementation.  History of iron deficiency: Yes Rectal bleeding: Denies Menstrual bleeding/ Vaginal bleeding : Denies Hematemesis or hemoptysis : denies Blood in urine : denies  Pica: Craving for ice chips Last endoscopy: Recent EGD was 3 years ago in [place].  She has history of Barrett's esophagitis hiatal hernia. Denies weight loss, fever, chills, fatigue, night sweats.   Fatigue: Chronic onset, perisistent, no aggravating or improving factors, no associated symptoms.   Family history positive for sister diagnosed with bladder cancer, maternal grandmother also had a history of bladder cancer.  Paternal grandmother had breast cancer.  Paternal grandmother father has colon cancer.  INTERVAL HISTORY Debra Perez is a 63 y.o. female who has above history reviewed by me today presents for follow up visit for management of iron deficiency anemia Reports doing well today.  Fatigue has improved.  No new complaints.  Review of Systems  Constitutional: Negative for chills, fever, malaise/fatigue and  weight loss.  HENT: Negative for nosebleeds and sore throat.   Eyes: Negative for double vision, photophobia and redness.  Respiratory: Negative for cough, shortness of breath and wheezing.   Cardiovascular: Negative for chest pain, palpitations, orthopnea and leg swelling.  Gastrointestinal: Positive for heartburn. Negative for abdominal pain, blood in stool, nausea and vomiting.  Genitourinary: Negative for dysuria.  Musculoskeletal: Negative for back pain, myalgias and neck pain.  Skin: Negative for itching and rash.  Neurological: Negative for dizziness, tingling and tremors.  Endo/Heme/Allergies: Negative for environmental allergies. Does not bruise/bleed easily.  Psychiatric/Behavioral: Negative for depression and hallucinations.    MEDICAL HISTORY:  Past Medical History:  Diagnosis Date  . 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 Diabetes mellitus without complication ([place])   . Diverticulosis   . GERD (gastroesophageal reflux disease)   . History of colonic polyps    benign  . Hyperlipidemia   . Hypertension   . Hypothyroidism   . Lung infection   . Mitral valve prolapse   . NSVT (nonsustained ventricular tachycardia) ([place])   . Obstructive sleep apnea    on CPAP  . PAF (paroxysmal atrial fibrillation) ([place])    a.  On Eliquis; b. CHADS2VASc => 3 (HTN, DM, female)  . Sleep apnea     SURGICAL HISTORY: Past Surgical History:  Procedure Laterality Date  . ABDOMINAL HYSTERECTOMY  2002   ovaries remain  . CLAVICLE SURGERY  2007   right clavicle plate pins  . COLONOSCOPY WITH PROPOFOL N/A 10/17/2017   Procedure: COLONOSCOPY WITH PROPOFOL;  Surgeon: Jonathon Bellows, MD;  Location: [facility] ENDOSCOPY;  Service: Gastroenterology;  Laterality: N/A;  . ESOPHAGOGASTRODUODENOSCOPY (EGD)  WITH PROPOFOL N/A 10/17/2017   Procedure: ESOPHAGOGASTRODUODENOSCOPY (EGD) WITH PROPOFOL;  Surgeon: Jonathon Bellows, MD;  Location: [facility] ENDOSCOPY;  Service: Gastroenterology;  Laterality: N/A;  .  GIVENS CAPSULE STUDY N/A 11/30/2017   Procedure: GIVENS CAPSULE STUDY;  Surgeon: Jonathon Bellows, MD;  Location: [facility] ENDOSCOPY;  Service: Gastroenterology;  Laterality: N/A;  . KNEE ARTHROSCOPY  2012    SOCIAL HISTORY: Social History   Socioeconomic History  . Marital status: Married    Spouse name: Not on file  . Number of children: Not on file  . Years of education: Not on file  . Highest education level: Not on file  Occupational History  . Occupation: retired  Scientific laboratory technician  . Financial resource strain: Not on file  . Food insecurity:    Worry: Not on file    Inability: Not on file  . Transportation needs:    Medical: Not on file    Non-medical: Not on file  Tobacco Use  . Smoking status: Former Smoker    Packs/day: 1.00    Years: 20.00    Pack years: 20.00    Types: Cigarettes    Last attempt to quit: 03/07/2004    Years since quitting: 14.0  . Smokeless tobacco: Never Used  Substance and Sexual Activity  . Alcohol use: No    Alcohol/week: 0.0 standard drinks  . Drug use: No  . Sexual activity: Not on file  Lifestyle  . Physical activity:    Days per week: Not on file    Minutes per session: Not on file  . Stress: Not on file  Relationships  . Social connections:    Talks on phone: Not on file    Gets together: Not on file    Attends religious service: Not on file    Active member of club or organization: Not on file    Attends meetings of clubs or organizations: Not on file    Relationship status: Not on file  . Intimate partner violence:    Fear of current or ex partner: Not on file    Emotionally abused: Not on file    Physically abused: Not on file    Forced sexual activity: Not on file  Other Topics Concern  . Not on file  Social History Narrative   Married.   1 daughter.   Moved here from [place] due to husbands occupation.   She is a Corporate treasurer.   Enjoys sewing, gardening.    FAMILY HISTORY: Family History  Problem Relation Age of Onset  . Depression  Mother   . Sudden death Mother 63  . Heart attack Mother   . Arthritis Father   . Heart disease Father   . Hypertension Father   . Emphysema Father   . Bladder Cancer Sister   . Diabetes Sister        Type 2  . Diabetes Brother        type  . Hypertension Brother   . Emphysema Brother   . Heart disease Brother   . Diabetes Maternal Grandfather   . Bladder Cancer Maternal Grandmother   . Breast cancer Paternal Grandmother   . Cancer Paternal Grandfather        colon cancer  . Diabetes Paternal Grandfather     ALLERGIES:  has No Known Allergies.  MEDICATIONS:  Current Outpatient Medications  Medication Sig Dispense Refill  . ACCU-CHEK FASTCLIX LANCETS MISC Use as instructed to test blood sugar 2 times daily 300 each 3  .  [name] 50 MG tablet TAKE 1 TABLET BY MOUTH AT BEDTIME 90 tablet 1  . Azelastine-Fluticasone (DYMISTA) 137-50 MCG/ACT SUSP Dymista 137 mcg-50 mcg/spray nasal spray    . b complex vitamins capsule Take 1 capsule by mouth 2 (two) times daily.    . cyclobenzaprine (FLEXERIL) 5 MG tablet TAKE 1 TABLET BY MOUTH AT BEDTIME AS NEEDED FOR MUSCLE SPASMS 90 tablet 0  . diclofenac sodium (VOLTAREN) 1 % GEL Apply 2 g topically 4 (four) times daily. (Patient taking differently: Apply 2 g topically 4 (four) times daily as needed. ) 100 g 0  . ELIQUIS 5 MG TABS tablet TAKE 1 TABLET BY MOUTH TWICE DAILY 180 tablet 1  . ezetimibe (ZETIA) 10 MG tablet TAKE 1 TABLET BY MOUTH DAILY. GENERIC EQUIVALENT FOR ZETIA. 90 tablet 2  . fluticasone (FLONASE) 50 MCG/ACT nasal spray fluticasone propionate 50 mcg/actuation nasal spray,suspension PRN    . glipiZIDE (GLUCOTROL XL) 5 MG 24 hr tablet TAKE 1 TABLET BY MOUTH DAILY WITH BREAKFAST 90 tablet 1  . glucose blood (ACCU-CHEK GUIDE) test strip Use to test blood sugar up to 2 times daily 300 each 1  . ibandronate (BONIVA) 150 MG tablet Take one tablet by mouth once monthly.Take in morning with water, on an empty stomach, without  medications, food, or lying down for 30 min. 12 tablet 3  . JANUMET 50-1000 MG tablet TAKE 1 TABLET BY MOUTH TWICE DAILY WITH A MEAL 180 tablet 1  . levothyroxine (SYNTHROID, LEVOTHROID) 88 MCG tablet Take 1 tablet by mouth every morning with water only. No food or medications for 30 minutes. 90 tablet 1  . Magnesium 500 MG CAPS Take 1 capsule by mouth 2 (two) times daily.    . metoprolol tartrate (LOPRESSOR) 25 MG tablet Take 1 tablet (25 mg total) by mouth 2 (two) times daily as needed. (Patient taking differently: Take 12.5 mg by mouth daily. ) 180 tablet 3  . metroNIDAZOLE (METROGEL) 1 % gel metronidazole 1 % topical gel    . Multiple Vitamins-Minerals (MULTIVITAMIN ADULT PO) Take 1 capsule by mouth 2 (two) times daily.     . predniSONE (DELTASONE) 10 MG tablet Take 10 mg by mouth as needed.   0  . propafenone (RYTHMOL SR) 225 MG 12 hr capsule Take 1 capsule (225 mg total) by mouth 2 (two) times daily. 180 capsule 3  . quinapril (ACCUPRIL) 20 MG tablet TAKE 1 TABLET BY MOUTH AT BEDTIME. GENERIC EQUIVALENT FOR ACCUPRIL. 90 tablet 3  . ranitidine (ZANTAC) 150 MG tablet Take 1 tablet (150 mg total) by mouth 2 (two) times daily. 180 tablet 2  . rosuvastatin (CRESTOR) 5 MG tablet TAKE 1 TABLET BY MOUTH DAILY. GENERIC EQUIVALENT FOR CRESTOR. FOLLOW UP APPOINTMENT NEEDED FOR FURTHER REFILLS 90 tablet 2  . venlafaxine XR (EFFEXOR-XR) 37.5 MG 24 hr capsule TAKE ONE CAPSULE BY MOUTH DAILY WITH BREAKFAST 90 capsule 2  . verapamil (CALAN-SR) 120 MG CR tablet Take 1.5 tablets (180 mg total) by mouth at bedtime. 30 tablet 6  . metoprolol succinate (TOPROL XL) 25 MG 24 hr tablet Take 0.5 tablets (12.5 mg total) by mouth daily. 45 tablet 3   No current facility-administered medications for this visit.    Facility-Administered Medications Ordered in Other Visits  Medication Dose Route Frequency Provider Last Rate Last Dose  . heparin lock flush 100 unit/mL  500 Units Intracatheter Once PRN Earlie Server, MD        . sodium chloride flush (NS) 0.9 %  injection 10 mL  10 mL Intracatheter PRN Earlie Server, MD   10 mL at 10/06/17 1515     PHYSICAL EXAMINATION: ECOG PERFORMANCE STATUS: 1 - Symptomatic but completely ambulatory Vitals:   04/09/18 1331  BP: 121/75  Pulse: 98  Resp: 18  Temp: 97.9 F (36.6 C)   Filed Weights   04/09/18 1331  Weight: 178 lb 14.4 oz (81.1 kg)    Physical Exam Constitutional:      General: She is not in acute distress. HENT:     Head: Normocephalic and atraumatic.  Eyes:     General: No scleral icterus.    Pupils: Pupils are equal, round, and reactive to light.  Neck:     Musculoskeletal: Normal range of motion and neck supple.  Cardiovascular:     Rate and Rhythm: Normal rate and regular rhythm.     Heart sounds: Normal heart sounds.  Pulmonary:     Effort: Pulmonary effort is normal. No respiratory distress.     Breath sounds: No wheezing.  Abdominal:     General: Bowel sounds are normal. There is no distension.     Palpations: Abdomen is soft. There is no mass.     Tenderness: There is no abdominal tenderness.  Musculoskeletal: Normal range of motion.        General: No deformity.  Skin:    General: Skin is warm and dry.     Findings: No erythema or rash.  Neurological:     Mental Status: She is alert and oriented to person, place, and time.     Cranial Nerves: No cranial nerve deficit.     Coordination: Coordination normal.  Psychiatric:        Behavior: Behavior normal.        Thought Content: Thought content normal.      LABORATORY DATA:  I have reviewed the data as listed Lab Results  Component Value Date   WBC 6.5 03/15/2018   HGB 14.5 03/15/2018   HCT 42.6 03/15/2018   MCV 93 03/15/2018   PLT 289 03/15/2018   Recent Labs    04/19/17 1212 08/24/17 0842 03/15/18 0911  NA 139 139 142  K 4.2 4.0 4.8  CL 104 99 101  CO2 29 33* 26  GLUCOSE 117* 140* 106*  BUN 12 15 12   CREATININE 0.74 0.80 0.66  CALCIUM 9.0 9.4 9.5  GFRNONAA   --   --  96  GFRAA  --   --  111  PROT  --  7.2  --   ALBUMIN  --  4.3  --   AST  --  15  --   ALT  --  27  --   ALKPHOS  --  43  --   BILITOT  --  0.4  --    Iron/TIBC/Ferritin/ %Sat    Component Value Date/Time   IRON 35 09/04/2017 1416   TIBC 406 09/04/2017 1416   FERRITIN 7 (L) 09/04/2017 1416   IRONPCTSAT 9 (LL) 09/04/2017 1416     Addendum, will obtain baseline CBC which was done at [facility] [[id number]] and faxed to [place].  Labs reviewed.  Hemoglobin 12.8, MCV 80, RDW 20.1, platelet count 3 65,000, WBC 9.3, normal differential.  11/30/2017, [facility] Hemoglobin 14.8, hematocrit 43.5, MCV 89, reticulocyte count 280, WBC 7.1, TIBC 297, iron 68, iron saturation 23, ferritin 84. 04/02/2018  Hemoglobin 14.4, MCV 92, platelet count 310,000, WBC 6.7. Iron 61, iron saturation 23, ferritin 42, TIBC 265.  ASSESSMENT &  PLAN:  1. Iron deficiency anemia, unspecified iron deficiency anemia type   2. Chronic anticoagulation    #Labs done from [facility] on 04/02/2018 reviewed and discussed with patient CBC and iron panel remained stable. Hold additional IV iron at this point.  Patient has had capsule study done which fine multiple small bowel AVMs.  She was referred to [facility] or [facility] for further evaluation. Patient reports that she has not been called and has not established care yet. Patient says she is going to call Dr. Georgeann Oppenheim office and follow-up on her appointment/referral to tertiary center.  #Chronic anticoagulation with Eliquis for atrial fibrillation..  Tolerates well.  Hemoglobin stable.  Continue.   Follow-up in 1 year with repeat CBC iron TIBC ferritin.  Lab prescription provided to patient..  Return of visit: 12 months Total face to face encounter time for this patient visit was 15 min. >50% of the time was  spent in counseling and coordination of care.  Earlie Server, MD, PhD Hematology Oncology [facility] Pager- [phone] 04/09/2018

## 2018-04-09 NOTE — Progress Notes (Signed)
Patient here for follow up. No concerns voiced.  °

## 2018-04-15 ENCOUNTER — Other Ambulatory Visit: Payer: Self-pay | Admitting: Primary Care

## 2018-04-15 DIAGNOSIS — E119 Type 2 diabetes mellitus without complications: Secondary | ICD-10-CM

## 2018-04-16 DIAGNOSIS — K219 Gastro-esophageal reflux disease without esophagitis: Secondary | ICD-10-CM

## 2018-04-17 MED ORDER — FAMOTIDINE 20 MG PO TABS: 20.0000 mg | ORAL_TABLET | Freq: Two times a day (BID) | ORAL | 3 refills | Status: DC

## 2018-04-20 ENCOUNTER — Other Ambulatory Visit: Payer: Self-pay | Admitting: Cardiovascular Disease

## 2018-04-24 NOTE — Progress Notes (Signed)
Cardiology Office Note Date:  04/26/2018  Patient ID:  Debra Perez, DOB Oct 31, 1955, MRN [id number] PCP:  Pleas [name], NP  Cardiologist:  Dr. Rockey Situ, MD    Chief Complaint: Follow up  History of Present Illness: Debra Perez is a 63 y.o. 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  Remote stress testing from 2005 in [place] showed no significant ischemia or prior MI, normal LV size and function with an EF of 80%, adequate exercise tolerance, good blood pressure and heart rate response to exercise.  Normal study.  Cardiac CT from 2016 showed an EF of 73%, ectatic ascending aorta measuring 3.72 cm in caliber, normal caliber of the descending segment, three-vessel CAD with mild multifocal calcific narrowing of the proximal LAD and proximal ramus intermedius.  Mild calcific narrowing in the proximal RCA with minimal narrowing in the middle third.  No critical or flow-limiting disease was identified.  Echo from 2016 was technically limited due to poor acoustic windows with an EF of 60 to 65%, mild concentric LVH, diastolic dysfunction, left atrium upper limits of normal, mild posterior MVP, trace mitral regurgitation, RVSP 27 mmHg, no pericardial effusion.  Patient has previously attributed her A. fib to a URI while on steroids and albuterol.  She has previously not tolerated scheduled beta-blockers secondary to fatigue and reported weight gain.  Multaq led to hair loss.  She was seen in the office on 09/27/2017 and was maintaining sinus rhythm.  She had been maintained on Rythmol, Eliquis, and PRN metoprolol.  In 11/2017 the patient was noting more palpitations.  She  was advised to take metoprolol scheduled rather than as needed.  Patient called the office on 03/12/2018 noting increase in nocturnal palpitations over the past month.  She was seen on 03/15/2018 noting an increase in nocturnal palpitations that dated back to late 10/2017.  She did not note any significant improvement in her symptoms with Toprol-XL 12.5 mg daily.  During the day, when she was active, she was asymptomatic.  She also felt like fatigue had increased since being placed on a beta-blocker.  She reported taking PRN Lopressor about every other day without significant improvement.  There was some associated shortness of breath with her palpitations.  The patient's Apple Watch rhythm strips showed sinus rhythm, bundle branch block, and occasional PACs/PVCs.  EKG and rhythm strip and the office visit on 03/15/2018 showed sinus rhythm with 2 PACs and 1 PVC.  Labs were unrevealing as outlined below.  Her verapamil was increased to 180 mg daily and she was continued on Toprol-XL 12.5 mg daily with PRN Lopressor 25 mg.  Echo on 04/06/2018 showed an EF of 60 to 26%, diastolic dysfunction, normal size left atrium, normal size right atrium, no significant valvular abnormalities, aortic root 2.8 cm, ascending aorta 3.2 cm, aortic arch 2.8 cm.  Outpatient cardiac monitoring showed an overall sinus rhythm with an average heart rate of 71 bpm.  There were 6 SVT/atrial tachycardic runs with the fastest interval lasting 5 beats with a maximum rate of 167 bpm and the longest interval lasting 14 beats with an average rate of 127 bpm.  Isolated PACs, atrial couplets, and atrial triplets were rare.  Isolated PVCs were rare.  Patient triggered events were either not associated with significant arrhythmia or with clusters of PACs.  Labs: 03/2018 -TSH normal, magnesium 2.2, CBC unremarkable, serum creatinine 0.66, potassium 4.8 01/2018 - A1c 6.1 08/2017 - LFT normal, LDL 46  She comes in accompanied by her husband today and is  doing well from a cardiac perspective.  Since increasing her verapamil to 180 mg daily she has noted less frequent palpitations.  Her pressure has remained in the 130s over 80s.  No chest pain, shortness of breath, dizziness, presyncope, or syncope.  No lower extremity swelling, abdominal distention, orthopnea, PND, or early satiety.  No falls since she was last seen.  Overall, she is quite pleased with her improvement.  She does not have any issues or concerns today.   Past Medical History:  Diagnosis Date  . 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 Diabetes mellitus without complication ([place])   . Diverticulosis   . GERD (gastroesophageal reflux disease)   . History of colonic polyps    benign  . Hyperlipidemia   . Hypertension   . Hypothyroidism   . Lung infection   . Mitral valve prolapse   . NSVT (nonsustained ventricular tachycardia) ([place])   . Obstructive sleep apnea    on CPAP  . PAF (paroxysmal atrial fibrillation) ([place])    a.  On Eliquis; b. CHADS2VASc => 3 (HTN, DM, female)  . Sleep apnea     Past Surgical History:  Procedure Laterality Date  . ABDOMINAL HYSTERECTOMY  2002   ovaries remain  . CLAVICLE SURGERY  2007   right clavicle plate pins  . COLONOSCOPY WITH PROPOFOL N/A 10/17/2017   Procedure: COLONOSCOPY WITH PROPOFOL;  Surgeon: Jonathon Bellows, MD;  Location: [facility] ENDOSCOPY;  Service: Gastroenterology;  Laterality: N/A;  . ESOPHAGOGASTRODUODENOSCOPY (EGD) WITH PROPOFOL N/A 10/17/2017   Procedure: ESOPHAGOGASTRODUODENOSCOPY (EGD) WITH PROPOFOL;  Surgeon: Jonathon Bellows, MD;  Location: [facility] ENDOSCOPY;  Service: Gastroenterology;  Laterality: N/A;  . GIVENS CAPSULE STUDY N/A 11/30/2017   Procedure: GIVENS CAPSULE STUDY;  Surgeon: Jonathon Bellows, MD;  Location: [facility] ENDOSCOPY;  Service: Gastroenterology;  Laterality: N/A;  . KNEE ARTHROSCOPY  2012    Current Meds  Medication Sig  . ACCU-CHEK FASTCLIX LANCETS MISC Use as instructed to test blood sugar 2 times daily  .  [name] 50 MG tablet TAKE 1 TABLET BY MOUTH AT BEDTIME  . Azelastine-Fluticasone (DYMISTA) 137-50 MCG/ACT SUSP Dymista 137 mcg-50 mcg/spray nasal spray  . b complex vitamins capsule Take 1 capsule by mouth 2 (two) times daily.  . cyclobenzaprine (FLEXERIL) 5 MG tablet TAKE 1 TABLET BY MOUTH AT BEDTIME AS NEEDED FOR MUSCLE SPASMS  . diclofenac sodium (VOLTAREN) 1 % GEL Apply 2 g topically 4 (four) times daily. (Patient taking differently: Apply 2 g topically 4 (four) times daily as needed. )  . ELIQUIS 5 MG TABS tablet TAKE 1 TABLET BY MOUTH TWICE DAILY  . ezetimibe (ZETIA) 10 MG tablet TAKE 1 TABLET BY MOUTH DAILY. GENERIC EQUIVALENT FOR ZETIA.  . famotidine (PEPCID) 20 MG tablet Take 1 tablet (20 mg total) by mouth 2 (two) times daily. For heartburn.  . fluticasone (FLONASE) 50 MCG/ACT nasal spray fluticasone propionate 50 mcg/actuation nasal spray,suspension PRN  . glipiZIDE (GLUCOTROL XL) 5 MG 24 hr tablet TAKE 1 TABLET BY MOUTH DAILY WITH BREAKFAST  . glucose blood (ACCU-CHEK GUIDE) test strip Use to test blood sugar up to 2 times daily  . ibandronate (BONIVA) 150 MG tablet Take one  tablet by mouth once monthly.Take in morning with water, on an empty stomach, without medications, food, or lying down for 30 min.  Marland Kitchen JANUMET 50-1000 MG tablet TAKE 1 TABLET BY MOUTH TWICE DAILY WITH A MEAL  . levothyroxine (SYNTHROID, LEVOTHROID) 88 MCG tablet Take 1 tablet by mouth every morning with water only. No food or medications for 30 minutes.  . Magnesium 500 MG CAPS Take 1 capsule by mouth 2 (two) times daily.  . metoprolol tartrate (LOPRESSOR) 25 MG tablet Take 1 tablet (25 mg total) by mouth 2 (two) times daily as needed. (Patient taking differently: Take 12.5 mg by mouth daily. )  . metroNIDAZOLE (METROGEL) 1 % gel metronidazole 1 % topical gel  . Multiple Vitamins-Minerals (MULTIVITAMIN ADULT PO) Take 1 capsule by mouth 2 (two) times daily.   . predniSONE (DELTASONE) 10 MG tablet Take  10 mg by mouth as needed.   . propafenone (RYTHMOL SR) 225 MG 12 hr capsule TAKE ONE CAPSULE BY MOUTH TWICE DAILY  . quinapril (ACCUPRIL) 20 MG tablet TAKE 1 TABLET BY MOUTH AT BEDTIME. GENERIC EQUIVALENT FOR ACCUPRIL.  Marland Kitchen rosuvastatin (CRESTOR) 5 MG tablet TAKE 1 TABLET BY MOUTH DAILY. GENERIC EQUIVALENT FOR CRESTOR. FOLLOW UP APPOINTMENT NEEDED FOR FURTHER REFILLS  . venlafaxine XR (EFFEXOR-XR) 37.5 MG 24 hr capsule TAKE ONE CAPSULE BY MOUTH DAILY WITH BREAKFAST  . verapamil (CALAN-SR) 120 MG CR tablet Take 1.5 tablets (180 mg total) by mouth at bedtime.    Allergies:   Patient has no known allergies.   Social History:  The patient  reports that she quit smoking about 14 years ago. Her smoking use included cigarettes. She has a 20.00 pack-year smoking history. She has never used smokeless tobacco. She reports that she does not drink alcohol or use drugs.   Family History:  The patient's family history includes Arthritis in her father; Bladder Cancer in her maternal grandmother and sister; Breast cancer in her paternal grandmother; Cancer in her paternal grandfather; Depression in her mother; Diabetes in her brother, maternal grandfather, paternal grandfather, and sister; Emphysema in her brother and father; Heart attack in her mother; Heart disease in her brother and father; Hypertension in her brother and father; Sudden death (age of onset: 49) in her mother.  ROS:   Review of Systems  Constitutional: Negative for chills, diaphoresis, fever, malaise/fatigue and weight loss.  HENT: Negative for congestion.   Eyes: Negative for discharge and redness.  Respiratory: Negative for cough, hemoptysis, sputum production, shortness of breath and wheezing.   Cardiovascular: Positive for palpitations. Negative for chest pain, orthopnea, claudication, leg swelling and PND.       Much improved palpitations.  Gastrointestinal: Negative for abdominal pain, blood in stool, heartburn, melena, nausea and  vomiting.  Genitourinary: Negative for hematuria.  Musculoskeletal: Negative for falls and myalgias.  Skin: Negative for rash.  Neurological: Negative for dizziness, tingling, tremors, sensory change, speech change, focal weakness, loss of consciousness and weakness.  Endo/Heme/Allergies: Does not bruise/bleed easily.  Psychiatric/Behavioral: Negative for substance abuse. The patient is not nervous/anxious.   All other systems reviewed and are negative.    PHYSICAL EXAM:  VS:  BP 130/72 (BP Location: Left Arm, Patient Position: Sitting, Cuff Size: Normal)   Pulse 67   Ht 5\' 6"  (1.676 m)   Wt 180 lb (81.6 kg)   BMI 29.05 kg/m  BMI: Body mass index is 29.05 kg/m.  Physical Exam  Constitutional: She is oriented to person, place, and time. She appears well-developed and  well-nourished.  HENT:  Head: Normocephalic and atraumatic.  Eyes: Right eye exhibits no discharge. Left eye exhibits no discharge.  Neck: Normal range of motion. No JVD present.  Cardiovascular: Normal rate, regular rhythm, S1 normal, S2 normal and normal heart sounds. Exam reveals no distant heart sounds, no friction rub, no midsystolic click and no opening snap.  No murmur heard. Pulses:      Posterior tibial pulses are 2+ on the right side and 2+ on the left side.  Pulmonary/Chest: Effort normal and breath sounds normal. No respiratory distress. She has no decreased breath sounds. She has no wheezes. She has no rales. She exhibits no tenderness.  Abdominal: Soft. She exhibits no distension. There is no abdominal tenderness.  Musculoskeletal:        General: No edema.  Neurological: She is alert and oriented to person, place, and time.  Skin: Skin is warm and dry. No cyanosis. Nails show no clubbing.  Psychiatric: She has a normal mood and affect. Her speech is normal and behavior is normal. Judgment and thought content normal.     EKG:  Was ordered and interpreted by me today. Shows NSR, 67 bpm, LAE, RBBB  (unchanged from prior)  Recent Labs: 08/24/2017: ALT 27 03/15/2018: BUN 12; Creatinine, Ser 0.66; Hemoglobin 14.5; Magnesium 2.2; Platelets 289; Potassium 4.8; Sodium 142; TSH 1.400  08/24/2017: Cholesterol 123; HDL 61.40; LDL Cholesterol 46; Total CHOL/HDL Ratio 2; Triglycerides 78.0; VLDL 15.6   CrCl cannot be calculated (Patient's most recent lab result is older than the maximum 21 days allowed.).   Wt Readings from Last 3 Encounters:  04/26/18 180 lb (81.6 kg)  04/09/18 178 lb 14.4 oz (81.1 kg)  03/15/18 175 lb (79.4 kg)     Other studies reviewed: Additional studies/records reviewed today include: summarized above  ASSESSMENT AND PLAN:  1. Palpitations/paroxysmal SVT/atrial tachycardia: Symptoms much improved following titration of verapamil to 180 mg daily.  Long discussion with patient regarding diagnosis and management of paroxysmal SVT.  Increase verapamil to 240 mg daily.  Continue Toprol-XL 12.5 mg daily.  Recent labs indicated normal thyroid function and electrolytes at goal.  2. PAF: No evidence of recurrent A. fib on recent outpatient cardiac monitoring.  Continue low-dose Toprol-XL 12.5 mg daily.  We will not escalate this dose given associated fatigue with this medication.  In the long run, we could consider continued titration of verapamil in an effort to get her off beta-blocker secondary to beta-blocker induced fatigue.  Increase verapamil to 240 mg daily.  Given her CHADS2VASc of 3, she will remain on Eliquis.  3. CAD involving the native coronary arteries without angina: No symptoms concerning for angina.  Recent echocardiogram demonstrated preserved LV systolic function as outlined above.  Continue metoprolol and Crestor.  Continue rate factor modification.  On Eliquis in place of aspirin.  4. Hypertension: Blood pressure is reasonably controlled though we could do better.  In this setting, we have increased her verapamil to 240 mg daily.  She will remain on Toprol-XL 12.5  mg daily along with quinapril.  5. Hyperlipidemia: LDL of 46 from 08/2017.  Remains on Crestor and Zetia.  Disposition: F/u with Dr. Rockey Situ or an APP in 3 months.  Current medicines are reviewed at length with the patient today.  The patient did not have any concerns regarding medicines.  Signed, Christell Faith, PA-C 04/26/2018 9:05 AM     [facility] [address] [place] [address] [place],  [address] [phone]

## 2018-04-26 ENCOUNTER — Encounter: Payer: Self-pay | Admitting: Physician Assistant

## 2018-04-26 ENCOUNTER — Ambulatory Visit (INDEPENDENT_AMBULATORY_CARE_PROVIDER_SITE_OTHER): Payer: Managed Care, Other (non HMO) | Admitting: Physician Assistant

## 2018-04-26 VITALS — BP 130/72 | HR 67 | Ht 66.0 in | Wt 180.0 lb

## 2018-04-26 DIAGNOSIS — E782 Mixed hyperlipidemia: Secondary | ICD-10-CM

## 2018-04-26 DIAGNOSIS — I471 Supraventricular tachycardia: Secondary | ICD-10-CM | POA: Diagnosis not present

## 2018-04-26 DIAGNOSIS — I2510 Atherosclerotic heart disease of native coronary artery without angina pectoris: Secondary | ICD-10-CM

## 2018-04-26 DIAGNOSIS — R002 Palpitations: Secondary | ICD-10-CM | POA: Diagnosis not present

## 2018-04-26 DIAGNOSIS — I10 Essential (primary) hypertension: Secondary | ICD-10-CM | POA: Diagnosis not present

## 2018-04-26 DIAGNOSIS — I480 Paroxysmal atrial fibrillation: Secondary | ICD-10-CM | POA: Diagnosis not present

## 2018-04-26 MED ORDER — VERAPAMIL HCL ER 240 MG PO TBCR: 240.0000 mg | EXTENDED_RELEASE_TABLET | Freq: Every day | ORAL | 3 refills | Status: DC

## 2018-04-26 NOTE — Patient Instructions (Signed)
Medication Instructions:  Your physician has recommended you make the following change in your medication:  1- INCREASE Verapamil to 240 mg by mouth once a day.  If you need a refill on your cardiac medications before your next appointment, please call your pharmacy.   Lab work: none If you have labs (blood work) drawn today and your tests are completely normal, you will receive your results only by: Marland Kitchen MyChart Message (if you have MyChart) OR . A paper copy in the mail If you have any lab test that is abnormal or we need to change your treatment, we will call you to review the results.  Testing/Procedures: none  Follow-Up: At [facility], you and your health needs are our priority.  As part of our continuing mission to provide you with exceptional heart care, we have created designated Provider Care Teams.  These Care Teams include your primary Cardiologist (physician) and Advanced Practice Providers (APPs -  Physician Assistants and Nurse Practitioners) who all work together to provide you with the care you need, when you need it. You will need a follow up appointment in 3 months.  Please call our office 2 months in advance to schedule this appointment.  You may see Dr Ida Rogue or one of the following Advanced Practice Providers on your designated Care Team:   Murray Hodgkins, NP Christell Faith, PA-C . Marrianne Mood, PA-C

## 2018-05-15 ENCOUNTER — Other Ambulatory Visit: Payer: Self-pay | Admitting: Primary Care

## 2018-05-15 ENCOUNTER — Other Ambulatory Visit: Payer: Self-pay | Admitting: Cardiovascular Disease

## 2018-05-15 ENCOUNTER — Encounter: Payer: Self-pay | Admitting: Primary Care

## 2018-05-15 ENCOUNTER — Ambulatory Visit (INDEPENDENT_AMBULATORY_CARE_PROVIDER_SITE_OTHER): Payer: Managed Care, Other (non HMO) | Admitting: Primary Care

## 2018-05-15 VITALS — BP 126/72 | HR 67 | Temp 98.0°F | Ht 66.0 in | Wt 181.5 lb

## 2018-05-15 DIAGNOSIS — J019 Acute sinusitis, unspecified: Secondary | ICD-10-CM

## 2018-05-15 DIAGNOSIS — M5441 Lumbago with sciatica, right side: Secondary | ICD-10-CM | POA: Diagnosis not present

## 2018-05-15 DIAGNOSIS — G8929 Other chronic pain: Secondary | ICD-10-CM | POA: Diagnosis not present

## 2018-05-15 DIAGNOSIS — K219 Gastro-esophageal reflux disease without esophagitis: Secondary | ICD-10-CM | POA: Diagnosis not present

## 2018-05-15 MED ORDER — FAMOTIDINE 20 MG PO TABS: 20.0000 mg | ORAL_TABLET | Freq: Two times a day (BID) | ORAL | 3 refills | Status: DC

## 2018-05-15 MED ORDER — PREDNISONE 20 MG PO TABS
ORAL_TABLET | ORAL | 0 refills | Status: DC
Start: 2018-05-15 — End: 2018-07-17

## 2018-05-15 MED ORDER — AMOXICILLIN-POT CLAVULANATE 875-125 MG PO TABS: 1.0000 | ORAL_TABLET | Freq: Two times a day (BID) | ORAL | 0 refills | Status: DC

## 2018-05-15 NOTE — Patient Instructions (Signed)
Start Augmentin antibiotics for the infection Take 1 tablet by mouth twice daily for 10 days.  Start prednisone 20 mg tablets. Take 3 tablets for 2 days, then 2 tablets for 2 days, then 1 tablet for 2 days.  You will be contacted regarding your referral to physical therapy.  Please let us know if you have not been contacted within one week.   It was a pleasure to see you today!   Chronic Back Pain When back pain lasts longer than 3 months, it is called chronic back pain.The cause of your back pain may not be known. Some common causes include:  Wear and tear (degenerative disease) of the bones, ligaments, or disks in your back.  Inflammation and stiffness in your back (arthritis). People who have chronic back pain often go through certain periods in which the pain is more intense (flare-ups). Many people can learn to manage the pain with home care. Follow these instructions at home: Pay attention to any changes in your symptoms. Take these actions to help with your pain: Activity   Avoid bending and other activities that make the problem worse.  Maintain a proper position when standing or sitting: ? When standing, keep your upper back and neck straight, with your shoulders pulled back. Avoid slouching. ? When sitting, keep your back straight and relax your shoulders. Do not round your shoulders or pull them backward.  Do not sit or stand in one place for long periods of time.  Take brief periods of rest throughout the day. This will reduce your pain. Resting in a lying or standing position is usually better than sitting to rest.  When you are resting for longer periods, mix in some mild activity or stretching between periods of rest. This will help to prevent stiffness and pain.  Get regular exercise. Ask your health care provider what activities are safe for you.  Do not lift anything that is heavier than 10 lb (4.5 kg). Always use proper lifting technique, which  includes: ? Bending your knees. ? Keeping the load close to your body. ? Avoiding twisting.  Sleep on a firm mattress in a comfortable position. Try lying on your side with your knees slightly bent. If you lie on your back, put a pillow under your knees. Managing pain  If directed, apply ice to the painful area. Your health care provider may recommend applying ice during the first 24-48 hours after a flare-up begins. ? Put ice in a plastic bag. ? Place a towel between your skin and the bag. ? Leave the ice on for 20 minutes, 2-3 times per day.  If directed, apply heat to the affected area as often as told by your health care provider. Use the heat source that your health care provider recommends, such as a moist heat pack or a heating pad. ? Place a towel between your skin and the heat source. ? Leave the heat on for 20-30 minutes. ? Remove the heat if your skin turns bright red. This is especially important if you are unable to feel pain, heat, or cold. You may have a greater risk of getting burned.  Try soaking in a warm tub.  Take over-the-counter and prescription medicines only as told by your health care provider.  Keep all follow-up visits as told by your health care provider. This is important. Contact a health care provider if:  You have pain that is not relieved with rest or medicine. Get help right away if:  You  have weakness or numbness in one or both of your legs or feet.  You have trouble controlling your bladder or your bowels.  You have nausea or vomiting.  You have pain in your abdomen.  You have shortness of breath or you faint. This information is not intended to replace advice given to you by your health care provider. Make sure you discuss any questions you have with your health care provider. Document Released: 03/31/2004 Document Revised: 09/28/2017 Document Reviewed: 08/31/2016 Elsevier Interactive Patient Education  2019 [employer].

## 2018-05-15 NOTE — Assessment & Plan Note (Signed)
Never received famotidine, re-ordered today.

## 2018-05-15 NOTE — Progress Notes (Signed)
Subjective:    Patient ID: Debra Perez, female    DOB: March 28, 1955, 63 y.o.   MRN: [id number]  HPI  Ms. Debra Perez is a 63 year old female with a history of type 2 diabetes, CAD, paroxysmal atrial fibrillation, hypertension, GERD who presents today with a chief complaint of sinus pressure. She also wants to discuss chronic back pain.  1) Sinus Pressure: She also reports sweats/chills, nasal congestion, headaches, ear fullness, dental pain. Her symptoms began about 3-4 weeks ago, progressed 6 days ago. She's taken Flonase, Tylenol without much improvement. She's expelling thick green mucous from her nasal cavity.   2) Chronic Back Pain: Chronic to bilateral lower back with radiation down to right hip. Symptoms of muscle tightness and stiffness, symptoms are during the morning and day. She's undergone lumbar spine injection which lasted for three weeks total. She's tried diclofenac gel, Tylenol, Flexeril without improvement. She's never tried physical therapy.   Review of Systems  Constitutional: Positive for fatigue. Negative for chills and fever.  HENT: Positive for congestion, sinus pressure and sinus pain. Negative for postnasal drip and sore throat.   Respiratory: Positive for cough. Negative for shortness of breath.   Cardiovascular: Negative for chest pain.  Musculoskeletal: Positive for arthralgias and back pain.  Neurological: Negative for weakness and numbness.       Past Medical History:  Diagnosis Date  . 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 Diabetes mellitus without complication ([place])   . Diverticulosis   . GERD (gastroesophageal reflux disease)   . History of colonic polyps    benign  . Hyperlipidemia   . Hypertension   . Hypothyroidism   . Lung infection   . Mitral valve prolapse   . NSVT (nonsustained ventricular tachycardia) ([place])   . Obstructive sleep apnea    on CPAP  . PAF (paroxysmal atrial fibrillation) ([place])    a.  On Eliquis; b. CHADS2VASc =>  3 (HTN, DM, female)  . Sleep apnea      Social History   Socioeconomic History  . Marital status: Married    Spouse name: Not on file  . Number of children: Not on file  . Years of education: Not on file  . Highest education level: Not on file  Occupational History  . Occupation: retired  Scientific laboratory technician  . Financial resource strain: Not on file  . Food insecurity:    Worry: Not on file    Inability: Not on file  . Transportation needs:    Medical: Not on file    Non-medical: Not on file  Tobacco Use  . Smoking status: Former Smoker    Packs/day: 1.00    Years: 20.00    Pack years: 20.00    Types: Cigarettes    Last attempt to quit: 03/07/2004    Years since quitting: 14.1  . Smokeless tobacco: Never Used  Substance and Sexual Activity  . Alcohol use: No    Alcohol/week: 0.0 standard drinks  . Drug use: No  . Sexual activity: Not on file  Lifestyle  . Physical activity:    Days per week: Not on file    Minutes per session: Not on file  . Stress: Not on file  Relationships  . Social connections:    Talks on phone: Not on file    Gets together: Not on file    Attends religious service: Not on file    Active member of club or organization: Not on file  Attends meetings of clubs or organizations: Not on file    Relationship status: Not on file  . Intimate partner violence:    Fear of current or ex partner: Not on file    Emotionally abused: Not on file    Physically abused: Not on file    Forced sexual activity: Not on file  Other Topics Concern  . Not on file  Social History Narrative   Married.   1 daughter.   Moved here from [place] due to husbands occupation.   She is a Corporate treasurer.   Enjoys sewing, gardening.    Past Surgical History:  Procedure Laterality Date  . ABDOMINAL HYSTERECTOMY  2002   ovaries remain  . CLAVICLE SURGERY  2007   right clavicle plate pins  . COLONOSCOPY WITH PROPOFOL N/A 10/17/2017   Procedure: COLONOSCOPY WITH PROPOFOL;  Surgeon:  Debra Bellows, MD;  Location: [facility] ENDOSCOPY;  Service: Gastroenterology;  Laterality: N/A;  . ESOPHAGOGASTRODUODENOSCOPY (EGD) WITH PROPOFOL N/A 10/17/2017   Procedure: ESOPHAGOGASTRODUODENOSCOPY (EGD) WITH PROPOFOL;  Surgeon: Debra Bellows, MD;  Location[facility] ENDOSCOPY;  Service: Gastroenterology;  Laterality: N/A;  . GIVENS CAPSULE STUDY N/A 11/30/2017   Procedure: GIVENS CAPSULE STUDY;  Surgeon: Debra Bellows, MD;  Location: [facility] ENDOSCOPY;  Service: Gastroenterology;  Laterality: N/A;  . KNEE ARTHROSCOPY  2012    Family History  Problem Relation Age of Onset  . Depression Mother   . Sudden death Mother 52  . Heart attack Mother   . Arthritis Father   . Heart disease Father   . Hypertension Father   . Emphysema Father   . Bladder Cancer Sister   . Diabetes Sister        Type 2  . Diabetes Brother        type  . Hypertension Brother   . Emphysema Brother   . Heart disease Brother   . Diabetes Maternal Grandfather   . Bladder Cancer Maternal Grandmother   . Breast cancer Paternal Grandmother   . Cancer Paternal Grandfather        colon cancer  . Diabetes Paternal Grandfather     No Known Allergies  Current Outpatient Medications on File Prior to Visit  Medication Sig Dispense Refill  . ACCU-CHEK FASTCLIX LANCETS MISC Use as instructed to test blood sugar 2 times daily 300 each 3  . [name] 50 MG tablet TAKE 1 TABLET BY MOUTH AT BEDTIME 90 tablet 1  . Azelastine-Fluticasone (DYMISTA) 137-50 MCG/ACT SUSP Dymista 137 mcg-50 mcg/spray nasal spray    . b complex vitamins capsule Take 1 capsule by mouth 2 (two) times daily.    Marland Kitchen ELIQUIS 5 MG TABS tablet TAKE 1 TABLET BY MOUTH TWICE DAILY 180 tablet 1  . ezetimibe (ZETIA) 10 MG tablet TAKE 1 TABLET BY MOUTH DAILY. GENERIC EQUIVALENT FOR ZETIA. 90 tablet 2  . fluticasone (FLONASE) 50 MCG/ACT nasal spray fluticasone propionate 50 mcg/actuation nasal spray,suspension PRN    . glipiZIDE (GLUCOTROL XL) 5 MG 24 hr tablet TAKE  1 TABLET BY MOUTH DAILY WITH BREAKFAST 90 tablet 1  . glucose blood (ACCU-CHEK GUIDE) test strip Use to test blood sugar up to 2 times daily 300 each 1  . ibandronate (BONIVA) 150 MG tablet Take one tablet by mouth once monthly.Take in morning with water, on an empty stomach, without medications, food, or lying down for 30 min. 12 tablet 3  . JANUMET 50-1000 MG tablet TAKE 1 TABLET BY MOUTH TWICE DAILY WITH A MEAL 180 tablet 1  .  levothyroxine (SYNTHROID, LEVOTHROID) 88 MCG tablet Take 1 tablet by mouth every morning with water only. No food or medications for 30 minutes. 90 tablet 1  . Magnesium 500 MG CAPS Take 1 capsule by mouth 2 (two) times daily.    . metroNIDAZOLE (METROGEL) 1 % gel metronidazole 1 % topical gel    . Multiple Vitamins-Minerals (MULTIVITAMIN ADULT PO) Take 1 capsule by mouth 2 (two) times daily.     . propafenone (RYTHMOL SR) 225 MG 12 hr capsule TAKE ONE CAPSULE BY MOUTH TWICE DAILY 180 capsule 3  . quinapril (ACCUPRIL) 20 MG tablet TAKE 1 TABLET BY MOUTH AT BEDTIME. GENERIC EQUIVALENT FOR ACCUPRIL. 90 tablet 3  . venlafaxine XR (EFFEXOR-XR) 37.5 MG 24 hr capsule TAKE ONE CAPSULE BY MOUTH DAILY WITH BREAKFAST 90 capsule 2  . verapamil (CALAN-SR) 240 MG CR tablet Take 1 tablet (240 mg total) by mouth at bedtime. 90 tablet 3  . metoprolol succinate (TOPROL XL) 25 MG 24 hr tablet Take 0.5 tablets (12.5 mg total) by mouth daily. 45 tablet 3   Current Facility-Administered Medications on File Prior to Visit  Medication Dose Route Frequency Provider Last Rate Last Dose  . heparin lock flush 100 unit/mL  500 Units Intracatheter Once PRN Earlie Server, MD      . sodium chloride flush (NS) 0.9 % injection 10 mL  10 mL Intracatheter PRN Earlie Server, MD   10 mL at 10/06/17 1515    BP 126/72   Pulse 67   Temp 98 F (36.7 C) (Oral)   Ht 5\' 6"  (1.676 m)   Wt 181 lb 8 oz (82.3 kg)   SpO2 97%   BMI 29.29 kg/m    Objective:   Physical Exam  Constitutional: She appears well-nourished.  She does not appear ill.  HENT:  Right Ear: Tympanic membrane and ear canal normal.  Left Ear: Tympanic membrane and ear canal normal.  Nose: No mucosal edema. Right sinus exhibits no maxillary sinus tenderness and no frontal sinus tenderness. Left sinus exhibits no maxillary sinus tenderness and no frontal sinus tenderness.  Mouth/Throat: Oropharynx is clear and moist.  Neck: Neck supple.  Cardiovascular: Normal rate and regular rhythm.  Respiratory: Effort normal and breath sounds normal. She has no wheezes.  Musculoskeletal:     Lumbar back: She exhibits decreased range of motion and pain. She exhibits no tenderness and no bony tenderness.       Back:     Comments: 5/5 strength to bilateral lower extremities  Skin: Skin is warm and dry.           Assessment & Plan:  Acute Sinusitis:  Symptoms for 3-4 weeks, progressed over the last 6 days. Exam today consistent for bacterial sinusitis.  Given duration of symptoms coupled with presentation we will treat. Rx for Augmentin course sent to pharmacy. Continue Flonase and Tylenol PRN.  Pleas [name], NP

## 2018-05-15 NOTE — Assessment & Plan Note (Signed)
Followed with orthopedics, no improvement with injection or other Rx medication. Referral placed for physical therapy.

## 2018-05-28 ENCOUNTER — Other Ambulatory Visit: Payer: Self-pay | Admitting: Primary Care

## 2018-05-28 DIAGNOSIS — R232 Flushing: Secondary | ICD-10-CM

## 2018-06-22 ENCOUNTER — Telehealth: Payer: Self-pay

## 2018-06-22 NOTE — Telephone Encounter (Signed)
Virtual Visit Pre-Appointment Phone Call  Steps For Call:  1. Confirm consent - "In the setting of the current Covid19 crisis, you are scheduled for a video visit with your provider on Jul 31, 2018 at 9:15AM.  Just as we do with many in-office visits, in order for you to participate in this visit, we must obtain consent.  If you'd like, I can send this to your mychart (if signed up) or email for you to review.  Otherwise, I can obtain your verbal consent now.  All virtual visits are billed to your insurance company just like a normal visit would be.  By agreeing to a virtual visit, we'd like you to understand that the technology does not allow for your provider to perform an examination, and thus may limit your provider's ability to fully assess your condition. If your provider identifies any concerns that need to be evaluated in person, we will make arrangements to do so.  Finally, though the technology is pretty good, we cannot assure that it will always work on either your or our end, and in the setting of a video visit, we may have to convert it to a phone-only visit.  In either situation, we cannot ensure that we have a secure connection.  Are you willing to proceed?" STAFF: Did the patient verbally acknowledge consent to telehealth visit? Document YES/NO here: YES  2. Confirm the BEST phone number to call the day of the visit by including in appointment notes  3. Give patient instructions for WebEx/MyChart download to smartphone as below or Doximity/Doxy.me if video visit (depending on what platform provider is using)  4. Advise patient to be prepared with their blood pressure, heart rate, weight, any heart rhythm information, their current medicines, and a piece of paper and pen handy for any instructions they may receive the day of their visit  5. Inform patient they will receive a phone call 15 minutes prior to their appointment time (may be from unknown caller ID) so they should be  prepared to answer  6. Confirm that appointment type is correct in Epic appointment notes (VIDEO vs PHONE)     TELEPHONE CALL NOTE  Debra Perez has been deemed a candidate for a follow-up tele-health visit to limit community exposure during the Covid-19 pandemic. I spoke with the patient via phone to ensure availability of phone/video source, confirm preferred email & phone number, and discuss instructions and expectations.  I reminded Debra Perez to be prepared with any vital sign and/or heart rhythm information that could potentially be obtained via home monitoring, at the time of her visit. I reminded Debra Perez to expect a phone call at the time of her visit if her visit.  Debra Perez 06/22/2018 11:11 AM   INSTRUCTIONS FOR DOWNLOADING THE WEBEX APP TO SMARTPHONE  - If Apple, ask patient to go to [employer] and type in WebEx in the search bar. Debra Perez, the blue/green circle. If Android, go to [employer] and type in [employer] in the search bar. The app is free but as with any other app downloads, their phone may require them to verify saved payment information or Apple/Android password.  - The patient does NOT have to create an account. - On the day of the visit, the assist will walk the patient through joining the meeting with the meeting number/password.  INSTRUCTIONS FOR DOWNLOADING THE MYCHART APP TO SMARTPHONE  - The patient must first make sure to  have activated MyChart and know their login information - If Apple, go to [employer] and type in MyChart in the search bar and download the app. If Android, ask patient to go to [employer] and type in [place] in the search bar and download the app. The app is free but as with any other app downloads, their phone may require them to verify saved payment information or Apple/Android password.  - The patient will need to then log into the app with their MyChart username and password, and  select [facility] as their healthcare provider to link the account. When it is time for your visit, go to the MyChart app, find appointments, and click Begin Video Visit. Be sure to Select Allow for your device to access the Microphone and Camera for your visit. You will then be connected, and your provider will be with you shortly.  **If they have any issues connecting, or need assistance please contact MyChart service desk [phone])**  **If using a computer, in order to ensure the best quality for their visit they will need to use either of the following Internet Browsers: [employer], or Google Chrome**  IF USING DOXIMITY or DOXY.ME - The patient will receive a link just prior to their visit, either by text or email (to be determined day of appointment depending on if it's doxy.me or Doximity).     FULL LENGTH CONSENT FOR TELE-HEALTH VISIT   I hereby voluntarily request, consent and authorize [facility] and its employed or contracted physicians, physician assistants, nurse practitioners or other licensed health care professionals (the Practitioner), to provide me with telemedicine health care services (the "Services") as deemed necessary by the treating Practitioner. I acknowledge and consent to receive the Services by the Practitioner via telemedicine. I understand that the telemedicine visit will involve communicating with the Practitioner through live audiovisual communication technology and the disclosure of certain medical information by electronic transmission. I acknowledge that I have been given the opportunity to request an in-person assessment or other available alternative prior to the telemedicine visit and am voluntarily participating in the telemedicine visit.  I understand that I have the right to withhold or withdraw my consent to the use of telemedicine in the course of my care at any time, without affecting my right to future care or treatment, and that  the Practitioner or I may terminate the telemedicine visit at any time. I understand that I have the right to inspect all information obtained and/or recorded in the course of the telemedicine visit and may receive copies of available information for a reasonable fee.  I understand that some of the potential risks of receiving the Services via telemedicine include:  Marland Kitchen Delay or interruption in medical evaluation due to technological equipment failure or disruption; . Information transmitted may not be sufficient (e.g. poor resolution of images) to allow for appropriate medical decision making by the Practitioner; and/or  . In rare instances, security protocols could fail, causing a breach of personal health information.  Furthermore, I acknowledge that it is my responsibility to provide information about my medical history, conditions and care that is complete and accurate to the best of my ability. I acknowledge that Practitioner's advice, recommendations, and/or decision may be based on factors not within their control, such as incomplete or inaccurate data provided by me or distortions of diagnostic images or specimens that may result from electronic transmissions. I understand that the practice of medicine is not an exact science and  that Practitioner makes no warranties or guarantees regarding treatment outcomes. I acknowledge that I will receive a copy of this consent concurrently upon execution via email to the email address I last provided but may also request a printed copy by calling the office of [facility].    I understand that my insurance will be billed for this visit.   I have read or had this consent read to me. . I understand the contents of this consent, which adequately explains the benefits and risks of the Services being provided via telemedicine.  . I have been provided ample opportunity to ask questions regarding this consent and the Services and have had my questions answered to  my satisfaction. . I give my informed consent for the services to be provided through the use of telemedicine in my medical care  By participating in this telemedicine visit I agree to the above.

## 2018-07-17 ENCOUNTER — Other Ambulatory Visit: Payer: Self-pay

## 2018-07-17 ENCOUNTER — Encounter: Payer: Self-pay | Admitting: Primary Care

## 2018-07-17 ENCOUNTER — Ambulatory Visit (INDEPENDENT_AMBULATORY_CARE_PROVIDER_SITE_OTHER)
Admission: RE | Admit: 2018-07-17 | Discharge: 2018-07-17 | Disposition: A | Discharge status: Home / Self Care | Payer: Managed Care, Other (non HMO) | Source: Ambulatory Visit | Attending: Primary Care | Admitting: Primary Care

## 2018-07-17 ENCOUNTER — Ambulatory Visit (INDEPENDENT_AMBULATORY_CARE_PROVIDER_SITE_OTHER): Payer: Managed Care, Other (non HMO) | Admitting: Primary Care

## 2018-07-17 ENCOUNTER — Telehealth: Payer: Self-pay

## 2018-07-17 VITALS — BP 126/84 | HR 71 | Temp 98.0°F | Ht 66.0 in | Wt 182.0 lb

## 2018-07-17 DIAGNOSIS — R0781 Pleurodynia: Secondary | ICD-10-CM | POA: Diagnosis not present

## 2018-07-17 MED ORDER — METHOCARBAMOL 500 MG PO TABS: 500.0000 mg | ORAL_TABLET | Freq: Three times a day (TID) | ORAL | 0 refills | Status: DC | PRN
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
## 2018-07-17 NOTE — Patient Instructions (Signed)
Complete xray(s) prior to leaving today. I will notify you of your results once received.  You may take the methocarbamol muscle relaxer every 8 hours as needed for muscle spasms.  You can take Tylenol along with the muscle relaxer.  Make sure to work on deep breathing exercises as discussed.   It was a pleasure to see you today!
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
## 2018-07-24 ENCOUNTER — Ambulatory Visit (INDEPENDENT_AMBULATORY_CARE_PROVIDER_SITE_OTHER): Payer: Managed Care, Other (non HMO) | Admitting: Primary Care

## 2018-07-24 ENCOUNTER — Encounter: Payer: Self-pay | Admitting: Primary Care

## 2018-07-24 VITALS — BP 138/78 | HR 70 | Temp 98.0°F | Ht 66.0 in | Wt 178.0 lb

## 2018-07-24 DIAGNOSIS — Z1239 Encounter for other screening for malignant neoplasm of breast: Secondary | ICD-10-CM

## 2018-07-24 DIAGNOSIS — I10 Essential (primary) hypertension: Secondary | ICD-10-CM

## 2018-07-24 DIAGNOSIS — I472 Ventricular tachycardia: Secondary | ICD-10-CM

## 2018-07-24 DIAGNOSIS — I25119 Atherosclerotic heart disease of native coronary artery with unspecified angina pectoris: Secondary | ICD-10-CM

## 2018-07-24 DIAGNOSIS — M5441 Lumbago with sciatica, right side: Secondary | ICD-10-CM

## 2018-07-24 DIAGNOSIS — M8580 Other specified disorders of bone density and structure, unspecified site: Secondary | ICD-10-CM

## 2018-07-24 DIAGNOSIS — Z0000 Encounter for general adult medical examination without abnormal findings: Secondary | ICD-10-CM | POA: Diagnosis not present

## 2018-07-24 DIAGNOSIS — G8929 Other chronic pain: Secondary | ICD-10-CM

## 2018-07-24 DIAGNOSIS — R232 Flushing: Secondary | ICD-10-CM | POA: Diagnosis not present

## 2018-07-24 DIAGNOSIS — E782 Mixed hyperlipidemia: Secondary | ICD-10-CM

## 2018-07-24 DIAGNOSIS — I4729 Other ventricular tachycardia: Secondary | ICD-10-CM

## 2018-07-24 DIAGNOSIS — I480 Paroxysmal atrial fibrillation: Secondary | ICD-10-CM

## 2018-07-24 DIAGNOSIS — E039 Hypothyroidism, unspecified: Secondary | ICD-10-CM

## 2018-07-24 DIAGNOSIS — D509 Iron deficiency anemia, unspecified: Secondary | ICD-10-CM

## 2018-07-24 DIAGNOSIS — K219 Gastro-esophageal reflux disease without esophagitis: Secondary | ICD-10-CM

## 2018-07-24 DIAGNOSIS — E1159 Type 2 diabetes mellitus with other circulatory complications: Secondary | ICD-10-CM

## 2018-07-24 DIAGNOSIS — Z23 Encounter for immunization: Secondary | ICD-10-CM

## 2018-07-24 MED ORDER — ZOSTER VAC RECOMB ADJUVANTED 50 MCG/0.5ML IM SUSR: 0.5000 mL | Freq: Once | INTRAMUSCULAR | 1 refills | Status: AC

## 2018-07-24 NOTE — Assessment & Plan Note (Signed)
Tetanus UTD. Shingrix due, Rx sent to pharmacy. Mammogram due, orders placed. Colonoscopy UTD. Discussed the importance of a healthy diet and regular exercise in order for weight loss, and to reduce the risk of any potential medical problems. Virtual exam unremarkable. Labs pending. Follow up in 1 year for CPE.

## 2018-07-24 NOTE — Assessment & Plan Note (Signed)
CBC from January 2020 stable. Continue to monitor.

## 2018-07-24 NOTE — Assessment & Plan Note (Signed)
Compliant to Crestor and Zetia. Repeat lipids pending.

## 2018-07-24 NOTE — Assessment & Plan Note (Signed)
Compliant to Eliquis BID. Continue verapamil, metoprolol succinate, propafenone.

## 2018-07-24 NOTE — Patient Instructions (Signed)
Call the office to schedule a fasting, lab only appointment as discussed.  Call the [facility] in [place] for your mammogram.  Start exercising. You should be getting 150 minutes of moderate intensity exercise weekly.  It is important that you improve your diet. Please limit carbohydrates in the form of white bread, rice, pasta, sweets, fast food, fried food, sugary drinks, etc. Increase your consumption of fresh fruits and vegetables, whole grains, lean protein.  Ensure you are consuming 64 ounces of water daily.  Schedule a 6 month follow up visit.  It was a pleasure to see you today! [name], NP-C    Preventive Care 40-64 Years, Female Preventive care refers to lifestyle choices and visits with your health care provider that can promote health and wellness. What does preventive care include?   A yearly physical exam. This is also called an annual well check.  Dental exams once or twice a year.  Routine eye exams. Ask your health care provider how often you should have your eyes checked.  Personal lifestyle choices, including: ? Daily care of your teeth and gums. ? Regular physical activity. ? Eating a healthy diet. ? Avoiding tobacco and drug use. ? Limiting alcohol use. ? Practicing safe sex. ? Taking low-dose aspirin daily starting at age 95. ? Taking vitamin and mineral supplements as recommended by your health care provider. What happens during an annual well check? The services and screenings done by your health care provider during your annual well check will depend on your age, overall health, lifestyle risk factors, and family history of disease. Counseling Your health care provider may ask you questions about your:  Alcohol use.  Tobacco use.  Drug use.  Emotional well-being.  Home and relationship well-being.  Sexual activity.  Eating habits.  Work and work Statistician.  Method of birth control.  Menstrual cycle.  Pregnancy  history. Screening You may have the following tests or measurements:  Height, weight, and BMI.  Blood pressure.  Lipid and cholesterol levels. These may be checked every 5 years, or more frequently if you are over 61 years old.  Skin check.  Lung cancer screening. You may have this screening every year starting at age 53 if you have a 30-pack-year history of smoking and currently smoke or have quit within the past 15 years.  Colorectal cancer screening. All adults should have this screening starting at age 73 and continuing until age 41. Your health care provider may recommend screening at age 38. You will have tests every 1-10 years, depending on your results and the type of screening test. People at increased risk should start screening at an earlier age. Screening tests may include: ? Guaiac-based fecal occult blood testing. ? Fecal immunochemical test (FIT). ? Stool DNA test. ? Virtual colonoscopy. ? Sigmoidoscopy. During this test, a flexible tube with a tiny camera (sigmoidoscope) is used to examine your rectum and lower colon. The sigmoidoscope is inserted through your anus into your rectum and lower colon. ? Colonoscopy. During this test, a long, thin, flexible tube with a tiny camera (colonoscope) is used to examine your entire colon and rectum.  Hepatitis C blood test.  Hepatitis B blood test.  Sexually transmitted disease (STD) testing.  Diabetes screening. This is done by checking your blood sugar (glucose) after you have not eaten for a while (fasting). You may have this done every 1-3 years.  Mammogram. This may be done every 1-2 years. Talk to your health care provider about when  you should start having regular mammograms. This may depend on whether you have a family history of breast cancer.  BRCA-related cancer screening. This may be done if you have a family history of breast, ovarian, tubal, or peritoneal cancers.  Pelvic exam and Pap test. This may be done every  3 years starting at age 22. Starting at age 74, this may be done every 5 years if you have a Pap test in combination with an HPV test.  Bone density scan. This is done to screen for osteoporosis. You may have this scan if you are at high risk for osteoporosis. Discuss your test results, treatment options, and if necessary, the need for more tests with your health care provider. Vaccines Your health care provider may recommend certain vaccines, such as:  Influenza vaccine. This is recommended every year.  Tetanus, diphtheria, and acellular pertussis (Tdap, Td) vaccine. You may need a Td booster every 10 years.  Varicella vaccine. You may need this if you have not been vaccinated.  Zoster vaccine. You may need this after age 58.  Measles, mumps, and rubella (MMR) vaccine. You may need at least one dose of MMR if you were born in 1957 or later. You may also need a second dose.  Pneumococcal 13-valent conjugate (PCV13) vaccine. You may need this if you have certain conditions and were not previously vaccinated.  Pneumococcal polysaccharide (PPSV23) vaccine. You may need one or two doses if you smoke cigarettes or if you have certain conditions.  Meningococcal vaccine. You may need this if you have certain conditions.  Hepatitis A vaccine. You may need this if you have certain conditions or if you travel or work in places where you may be exposed to hepatitis A.  Hepatitis B vaccine. You may need this if you have certain conditions or if you travel or work in places where you may be exposed to hepatitis B.  Haemophilus influenzae type b (Hib) vaccine. You may need this if you have certain conditions. Talk to your health care provider about which screenings and vaccines you need and how often you need them. This information is not intended to replace advice given to you by your health care provider. Make sure you discuss any questions you have with your health care provider. Document  Released: 03/20/2015 Document Revised: 04/13/2017 Document Reviewed: 12/23/2014 Elsevier Interactive Patient Education  2019 [employer].

## 2018-07-24 NOTE — Assessment & Plan Note (Signed)
Doing well on venlafaxine XR, continue same.

## 2018-07-24 NOTE — Assessment & Plan Note (Signed)
TSH from January 2020 stable. Patient taking levothyroxine appropriately. Reminded her that calcium and vitamin D needed to be separated by 4 hours from levothyroxine.

## 2018-07-24 NOTE — Assessment & Plan Note (Signed)
Following with ortho, doing well on amitriptyline. Continue same.

## 2018-07-24 NOTE — Assessment & Plan Note (Signed)
Doing well on Pepcid, continue same.

## 2018-07-24 NOTE — Assessment & Plan Note (Signed)
Could not tolerate Boniva, compliant to calcium and vitamin D. Repeat bone density scan in 2021.

## 2018-07-24 NOTE — Assessment & Plan Note (Signed)
Repeat A1C pending. Managed on statin and ACE. Pneumonia vaccination due next office visit. Eye exam due, she plans on rescheduling due to Covid-19.  Continue glipizide and Janumet for now. Follow up in 6 months.

## 2018-07-24 NOTE — Progress Notes (Signed)
Subjective:    Patient ID: Debra Perez, female    DOB: 12-26-1955, 63 y.o.   MRN: [id number]  HPI  Virtual Visit via Video Note  I connected with Tezra Poster on 07/24/18 at  8:00 AM EDT by a video enabled telemedicine application and verified that I am speaking with the correct person using two identifiers.  Location: Patient: Home Provider: Office   I discussed the limitations of evaluation and management by telemedicine and the availability of in person appointments. The patient expressed understanding and agreed to proceed.  History of Present Illness:  Debra Perez is a 63 year old female who presents today for complete physical.  Immunizations: -Tetanus: Completed in 2016 -Influenza: Completed last season  -Pneumonia: Completed in 2014 -Shingles: Never completed  Eye exam: No recent exam Dental exam: Completes semi-annually  Colonoscopy: Completed in 2019 Pap Smear: Hysterectomy Mammogram: Due Dexa: Completed in 2019, osteopenia Hep C Screen: Negative in 2019    Observations/Objective:  Alert and oriented. Appears well, not sickly. No distress. Speaking in complete sentences.   Assessment and Plan:  See problem based charting  Follow Up Instructions:  Call the office to schedule a fasting, lab only appointment as discussed.  Call the [facility] in [place] for your mammogram.  Start exercising. You should be getting 150 minutes of moderate intensity exercise weekly.  It is important that you improve your diet. Please limit carbohydrates in the form of white bread, rice, pasta, sweets, fast food, fried food, sugary drinks, etc. Increase your consumption of fresh fruits and vegetables, whole grains, lean protein.  Ensure you are consuming 64 ounces of water daily.  Schedule a 6 month follow up visit.  It was a pleasure to see you today! [name], NP-C    I discussed the assessment and treatment plan with the patient. The  patient was provided an opportunity to ask questions and all were answered. The patient agreed with the plan and demonstrated an understanding of the instructions.   The patient was advised to call back or seek an in-person evaluation if the symptoms worsen or if the condition fails to improve as anticipated.     Pleas [name], NP    Review of Systems  Constitutional: Negative for unexpected weight change.  HENT: Negative for rhinorrhea.   Respiratory: Negative for cough and shortness of breath.   Cardiovascular: Negative for chest pain.  Gastrointestinal: Negative for constipation and diarrhea.  Genitourinary: Negative for difficulty urinating.  Musculoskeletal:       Chronic back pain  Skin: Negative for rash.  Allergic/Immunologic: Positive for environmental allergies.  Neurological: Negative for dizziness, numbness and headaches.  Psychiatric/Behavioral: Negative for suicidal ideas. The patient is not nervous/anxious.        Past Medical History:  Diagnosis Date  . 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 Diabetes mellitus without complication ([place])   . Diverticulosis   . GERD (gastroesophageal reflux disease)   . History of colonic polyps    benign  . Hyperlipidemia   . Hypertension   . Hypothyroidism   . Lung infection   . Mitral valve prolapse   . NSVT (nonsustained ventricular tachycardia) ([place])   . Obstructive sleep apnea    on CPAP  . PAF (paroxysmal atrial fibrillation) ([place])    a.  On Eliquis; b. CHADS2VASc => 3 (HTN, DM, female)  . Sleep apnea      Social History   Socioeconomic History  .  Marital status: Married    Spouse name: Not on file  . Number of children: Not on file  . Years of education: Not on file  . Highest education level: Not on file  Occupational History  . Occupation: retired  Scientific laboratory technician  . Financial resource strain: Not on file  . Food insecurity:    Worry: Not on file    Inability: Not on file  . Transportation  needs:    Medical: Not on file    Non-medical: Not on file  Tobacco Use  . Smoking status: Former Smoker    Packs/day: 1.00    Years: 20.00    Pack years: 20.00    Types: Cigarettes    Last attempt to quit: 03/07/2004    Years since quitting: 14.3  . Smokeless tobacco: Never Used  Substance and Sexual Activity  . Alcohol use: No    Alcohol/week: 0.0 standard drinks  . Drug use: No  . Sexual activity: Not on file  Lifestyle  . Physical activity:    Days per week: Not on file    Minutes per session: Not on file  . Stress: Not on file  Relationships  . Social connections:    Talks on phone: Not on file    Gets together: Not on file    Attends religious service: Not on file    Active member of club or organization: Not on file    Attends meetings of clubs or organizations: Not on file    Relationship status: Not on file  . Intimate partner violence:    Fear of current or ex partner: Not on file    Emotionally abused: Not on file    Physically abused: Not on file    Forced sexual activity: Not on file  Other Topics Concern  . Not on file  Social History Narrative   Married.   1 daughter.   Moved here from [place] due to husbands occupation.   She is a Corporate treasurer.   Enjoys sewing, gardening.    Past Surgical History:  Procedure Laterality Date  . ABDOMINAL HYSTERECTOMY  2002   ovaries remain  . CLAVICLE SURGERY  2007   right clavicle plate pins  . COLONOSCOPY WITH PROPOFOL N/A 10/17/2017   Procedure: COLONOSCOPY WITH PROPOFOL;  Surgeon: Jonathon Bellows, MD;  Location: [facility] ENDOSCOPY;  Service: Gastroenterology;  Laterality: N/A;  . ESOPHAGOGASTRODUODENOSCOPY (EGD) WITH PROPOFOL N/A 10/17/2017   Procedure: ESOPHAGOGASTRODUODENOSCOPY (EGD) WITH PROPOFOL;  Surgeon: Jonathon Bellows, MD;  Location: [facility] ENDOSCOPY;  Service: Gastroenterology;  Laterality: N/A;  . GIVENS CAPSULE STUDY N/A 11/30/2017   Procedure: GIVENS CAPSULE STUDY;  Surgeon: Jonathon Bellows, MD;  Location: [facility] ENDOSCOPY;   Service: Gastroenterology;  Laterality: N/A;  . KNEE ARTHROSCOPY  2012    Family History  Problem Relation Age of Onset  . Depression Mother   . Sudden death Mother 73  . Heart attack Mother   . Arthritis Father   . Heart disease Father   . Hypertension Father   . Emphysema Father   . Bladder Cancer Sister   . Diabetes Sister        Type 2  . Diabetes Brother        type  . Hypertension Brother   . Emphysema Brother   . Heart disease Brother   . Diabetes Maternal Grandfather   . Bladder Cancer Maternal Grandmother   . Breast cancer Paternal Grandmother   . Cancer Paternal Grandfather  colon cancer  . Diabetes Paternal Grandfather     No Known Allergies  Current Outpatient Medications on File Prior to Visit  Medication Sig Dispense Refill  . ACCU-CHEK FASTCLIX LANCETS MISC Use as instructed to test blood sugar 2 times daily 300 each 3  . [name] 50 MG tablet TAKE 1 TABLET BY MOUTH AT BEDTIME 90 tablet 1  . Azelastine-Fluticasone (DYMISTA) 137-50 MCG/ACT SUSP Dymista 137 mcg-50 mcg/spray nasal spray    . b complex vitamins capsule Take 1 capsule by mouth 2 (two) times daily.    Marland Kitchen ELIQUIS 5 MG TABS tablet TAKE 1 TABLET BY MOUTH TWICE DAILY 180 tablet 1  . ezetimibe (ZETIA) 10 MG tablet TAKE 1 TABLET BY MOUTH DAILY. GENERIC EQUIVALENT FOR ZETIA. 90 tablet 2  . famotidine (PEPCID) 20 MG tablet Take 1 tablet (20 mg total) by mouth 2 (two) times daily. For heartburn. 180 tablet 3  . fluticasone (FLONASE) 50 MCG/ACT nasal spray fluticasone propionate 50 mcg/actuation nasal spray,suspension PRN    . glipiZIDE (GLUCOTROL XL) 5 MG 24 hr tablet TAKE 1 TABLET BY MOUTH DAILY WITH BREAKFAST 90 tablet 1  . glucose blood (ACCU-CHEK GUIDE) test strip Use to test blood sugar up to 2 times daily 300 each 1  . Iron-Vitamin C (VITRON-C) 65-125 MG TABS Take by mouth.    Marland Kitchen JANUMET 50-1000 MG tablet TAKE 1 TABLET BY MOUTH TWICE DAILY WITH A MEAL 180 tablet 1  . levothyroxine  (SYNTHROID, LEVOTHROID) 88 MCG tablet Take 1 tablet by mouth every morning with water only. No food or medications for 30 minutes. 90 tablet 1  . Magnesium 500 MG CAPS Take 1 capsule by mouth 2 (two) times daily.    . methocarbamol (ROBAXIN) 500 MG tablet Take 1 tablet (500 mg total) by mouth every 8 (eight) hours as needed for muscle spasms. 15 tablet 0  . metroNIDAZOLE (METROGEL) 1 % gel metronidazole 1 % topical gel    . Multiple Vitamins-Minerals (MULTIVITAMIN ADULT PO) Take 1 capsule by mouth 2 (two) times daily.     . propafenone (RYTHMOL SR) 225 MG 12 hr capsule TAKE ONE CAPSULE BY MOUTH TWICE DAILY 180 capsule 3  . quinapril (ACCUPRIL) 20 MG tablet TAKE 1 TABLET BY MOUTH AT BEDTIME. GENERIC EQUIVALENT FOR ACCUPRIL. 90 tablet 3  . rosuvastatin (CRESTOR) 5 MG tablet TAKE 1 TABLET BY MOUTH DAILY GENERIC EQUIVALENT FOR CRESTOR 90 tablet 2  . venlafaxine XR (EFFEXOR-XR) 37.5 MG 24 hr capsule TAKE ONE CAPSULE BY MOUTH DAILY WITH BREAKFAST 90 capsule 2  . verapamil (CALAN-SR) 240 MG CR tablet Take 1 tablet (240 mg total) by mouth at bedtime. 90 tablet 3  . metoprolol succinate (TOPROL XL) 25 MG 24 hr tablet Take 0.5 tablets (12.5 mg total) by mouth daily. 45 tablet 3   Current Facility-Administered Medications on File Prior to Visit  Medication Dose Route Frequency Provider Last Rate Last Dose  . heparin lock flush 100 unit/mL  500 Units Intracatheter Once PRN Earlie Server, MD      . sodium chloride flush (NS) 0.9 % injection 10 mL  10 mL Intracatheter PRN Earlie Server, MD   10 mL at 10/06/17 1515    BP 138/78   Pulse 70   Temp 98 F (36.7 C) (Oral)   Ht 5\' 6"  (1.676 m)   Wt 178 lb (80.7 kg)   BMI 28.73 kg/m    Objective:   Physical Exam  Constitutional: She is oriented to person, place, and time.  She appears well-nourished.  Neck: Neck supple.  Respiratory: Effort normal.  Neurological: She is alert and oriented to person, place, and time.  Psychiatric: She has a normal mood and affect.            Assessment & Plan:

## 2018-07-24 NOTE — Assessment & Plan Note (Signed)
Asymptomatic.  Following with cardiology. Continue statin. Repeat lipid panel pending.

## 2018-07-24 NOTE — Assessment & Plan Note (Signed)
Asymptomatic. Recently placed on metoprolol succinate for palpitations and doing well. Continue current regimen. Following with cardiology.

## 2018-07-24 NOTE — Assessment & Plan Note (Signed)
Stable. Continue quinapril and metoprolol succinate. BMP from January 2020 reviewed.

## 2018-07-25 NOTE — Progress Notes (Deleted)
{Choose 1 Note Type (Telehealth Visit or Telephone Visit):[phone]}   Date:  07/25/2018   ID:  Debra Perez, DOB May 26, 1955, MRN [id number]  {Patient [phone]::"Home"} {Provider Location:[phone]::"Home"}  PCP:  Pleas [name], NP  Cardiologist:  Ida Rogue, MD  Electrophysiologist:  None   Evaluation Performed:  Follow-Up Visit  Chief Complaint:  Follow up palpitations   History of Present Illness:    Debra Perez is a 63 y.o. 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  Remote stress testing from 2005 in [place] showed no significant ischemia or prior MI, normal LV size and function with an EF of 80%, adequate exercise tolerance, good blood pressure and heart rate response to exercise. Normal study. Cardiac CT from 2016 showed an EF of 73%, ectatic ascending aorta measuring 3.72 cm in caliber, normal caliber of the descending segment, three-vessel CAD with mild multifocal calcific narrowing of the proximal LAD and proximal ramus intermedius. Mild calcific narrowing in the proximal RCA with minimal narrowing in the middle third. No critical or flow-limiting disease was identified. Echo from 2016 was technically limited due to poor acoustic windows with an EF of 60 to 65%, mild concentric LVH, diastolic dysfunction, left atrium upper limits of normal, mild posterior MVP, trace mitral regurgitation, RVSP 27 mmHg, no pericardial effusion. Patient has previously attributed her A. fib to a URI while on steroids and albuterol. She has previously not tolerated scheduled beta-blockers secondary to fatigue and reported weight gain. Multaqled to hair  loss. She was seen in the office on 09/27/2017 and was maintaining sinus rhythm. She had been maintained on Rythmol, Eliquis, and PRN metoprolol. In 11/2017 the patient was noting more palpitations. She was advised to take metoprolol scheduled rather than as needed. Patient called the office on 03/12/2018 noting increase in nocturnal palpitations over the past month.  Patient's Apple Watch showed sinus rhythm with PACs/PVCs. EKG and rhythm strip in the office showed NSR with PACs/PVC. Labs were unrevealing. Her verapamil was increased to 180 mg and she was continued on Toprol 12.5 mg. Echo in 03/2018 showed an EF of 60 to 53%, diastolic dysfunction, normal size left atrium, normal size right atrium, no significant valvular abnormalities, aortic root 2.8 cm, ascending aorta 3.2 cm, aortic arch 2.8 cm.  Outpatient cardiac monitoring showed an overall sinus rhythm with an average heart rate of 71 bpm.  There were 6 SVT/atrial tachycardic runs with the fastest interval lasting 5 beats with a maximum rate of 167 bpm and the longest interval lasting 14 beats with an average rate of 127 bpm.  Isolated PACs, atrial couplets, and atrial triplets were rare.  Isolated PVCs were rare.  Patient triggered events were either not associated with significant arrhythmia or with clusters of PACs.  She was most recently seen in the office in 04/2018 noting improvement in her palpitations following the titration of verapamil as above. Her verapamil was further increased to 240 mg daily given the above ectopy noted on cardiac monitoring and in the setting of suboptimally controlled BP.   ***  Labs: 03/2018 -TSH normal, magnesium 2.2, CBC unremarkable, serum creatinine 0.66, potassium 4.8 01/2018-A1c 6.1 08/2017- LFT normal, LDL 46    The patient {does/does not:200015} have symptoms concerning for COVID-19 infection (fever, chills,  cough, or new shortness of breath).    Past Medical History:  Diagnosis Date  . Carotid  artery occlusion    20 % left side  . Diabetes mellitus without complication ([place])   . Diverticulosis   . GERD (gastroesophageal reflux disease)   . History of colonic polyps    benign  . Hyperlipidemia   . Hypertension   . Hypothyroidism   . Lung infection   . Mitral valve prolapse   . NSVT (nonsustained ventricular tachycardia) ([place])   . Obstructive sleep apnea    on CPAP  . PAF (paroxysmal atrial fibrillation) ([place])    a.  On Eliquis; b. CHADS2VASc => 3 (HTN, DM, female)  . Sleep apnea    Past Surgical History:  Procedure Laterality Date  . ABDOMINAL HYSTERECTOMY  2002   ovaries remain  . CLAVICLE SURGERY  2007   right clavicle plate pins  . COLONOSCOPY WITH PROPOFOL N/A 10/17/2017   Procedure: COLONOSCOPY WITH PROPOFOL;  Surgeon: Jonathon Bellows, MD;  Location: [facility] ENDOSCOPY;  Service: Gastroenterology;  Laterality: N/A;  . ESOPHAGOGASTRODUODENOSCOPY (EGD) WITH PROPOFOL N/A 10/17/2017   Procedure: ESOPHAGOGASTRODUODENOSCOPY (EGD) WITH PROPOFOL;  Surgeon: Jonathon Bellows, MD;  Location: [facility] ENDOSCOPY;  Service: Gastroenterology;  Laterality: N/A;  . GIVENS CAPSULE STUDY N/A 11/30/2017   Procedure: GIVENS CAPSULE STUDY;  Surgeon: Jonathon Bellows, MD;  Location: [facility] ENDOSCOPY;  Service: Gastroenterology;  Laterality: N/A;  . KNEE ARTHROSCOPY  2012     No outpatient medications have been marked as taking for the 07/31/18 encounter (Appointment) with Rise Mu, PA-C.     Allergies:   Patient has no known allergies.   Social History   Tobacco Use  . Smoking status: Former Smoker    Packs/day: 1.00    Years: 20.00    Pack years: 20.00    Types: Cigarettes    Last attempt to quit: 03/07/2004    Years since quitting: 14.3  . Smokeless tobacco: Never Used  Substance Use Topics  . Alcohol use: No    Alcohol/week: 0.0 standard drinks  . Drug use: No     Family Hx: The patient's family history includes Arthritis in her father; Bladder Cancer in her maternal grandmother and sister;  Breast cancer in her paternal grandmother; Cancer in her paternal grandfather; Depression in her mother; Diabetes in her brother, maternal grandfather, paternal grandfather, and sister; Emphysema in her brother and father; Heart attack in her mother; Heart disease in her brother and father; Hypertension in her brother and father; Sudden death (age of onset: 21) in her mother.  ROS:   Please see the history of present illness.     All other systems reviewed and are negative.   Prior CV studies:   The following studies were reviewed today:  Event monitor 03/2018: Normal sinus rhythm Average Heart rate: 71 bpm.   6 Supraventricular Tachycardia/atrial tachyardia runs occurred, the run with the fastest interval lasting 5 beats with a max rate of 167 bpm,  the longest lasting 14 beats with an avg rate of 127 bpm.   Isolated SVEs were rare (<1.0%), SVE Couplets were rare (<1.0%), and SVE Triplets were rare (<1.0%).  Isolated VEs were rare (<1.0%), and no VE Couplets or VE Triplets were present.  Patient triggered events were either not associated with significant arrhythmia, or with clusters of APCs. __________  2D Echo 03/2018: 1. The left ventricle has normal systolic function of 23-53%. The cavity size is normal. There is no left ventricular wall thickness.  Echo evidence of impaired relaxation diastolic filling patterns.  2. Normal left atrial size.  3. Normal right atrial size.  4. Normal tricuspid valve.  5. There is normal right ventricular systolic function. b Right ventricular systolic pressure could not be assessed.  Labs/Other Tests and Data Reviewed:    EKG:  An ECG dated 04/26/2018 was personally reviewed today and demonstrated:  NSR, 67 bpm, LAE, RBBB (unchanged from prior)  Recent Labs: 08/24/2017: ALT 27 03/15/2018: BUN 12; Creatinine, Ser 0.66; Hemoglobin 14.5; Magnesium 2.2; Platelets 289; Potassium 4.8; Sodium 142; TSH 1.400   Recent Lipid Panel Lab Results  Component  Value Date/Time   CHOL 123 08/24/2017 08:42 AM   TRIG 78.0 08/24/2017 08:42 AM   HDL 61.40 08/24/2017 08:42 AM   CHOLHDL 2 08/24/2017 08:42 AM   LDLCALC 46 08/24/2017 08:42 AM    Wt Readings from Last 3 Encounters:  07/24/18 178 lb (80.7 kg)  07/17/18 182 lb (82.6 kg)  05/15/18 181 lb 8 oz (82.3 kg)     Objective:    Vital Signs:  There were no vitals taken for this visit.   {[facility] Virtual Exam (Optional):[phone]::"VITAL SIGNS:  reviewed"}  ASSESSMENT & PLAN:    1. ***  COVID-19 Education: The signs and symptoms of COVID-19 were discussed with the patient and how to seek care for testing (follow up with PCP or arrange E-visit).  The importance of social distancing was discussed today.  Time:   Today, I have spent *** minutes with the patient with telehealth technology discussing the above problems.     Medication Adjustments/Labs and Tests Ordered: Current medicines are reviewed at length with the patient today.  Concerns regarding medicines are outlined above.   Tests Ordered: No orders of the defined types were placed in this encounter.   Medication Changes: No orders of the defined types were placed in this encounter.   Disposition:  Follow up {follow up:15908}  Signed, Christell Faith, PA-C  07/25/2018 1:20 PM    [facility]

## 2018-07-31 ENCOUNTER — Telehealth: Payer: Managed Care, Other (non HMO) | Admitting: Physician Assistant
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
## 2018-08-02 ENCOUNTER — Telehealth (INDEPENDENT_AMBULATORY_CARE_PROVIDER_SITE_OTHER): Payer: Managed Care, Other (non HMO) | Admitting: Physician Assistant

## 2018-08-02 ENCOUNTER — Other Ambulatory Visit: Payer: Self-pay

## 2018-08-02 VITALS — BP 141/86 | HR 65 | Ht 66.5 in | Wt 180.0 lb

## 2018-08-02 DIAGNOSIS — I491 Atrial premature depolarization: Secondary | ICD-10-CM

## 2018-08-02 DIAGNOSIS — E785 Hyperlipidemia, unspecified: Secondary | ICD-10-CM

## 2018-08-02 DIAGNOSIS — I493 Ventricular premature depolarization: Secondary | ICD-10-CM | POA: Diagnosis not present

## 2018-08-02 DIAGNOSIS — I77819 Aortic ectasia, unspecified site: Secondary | ICD-10-CM

## 2018-08-02 DIAGNOSIS — I10 Essential (primary) hypertension: Secondary | ICD-10-CM

## 2018-08-02 DIAGNOSIS — I2510 Atherosclerotic heart disease of native coronary artery without angina pectoris: Secondary | ICD-10-CM

## 2018-08-02 DIAGNOSIS — I471 Supraventricular tachycardia, unspecified: Secondary | ICD-10-CM

## 2018-08-02 DIAGNOSIS — I480 Paroxysmal atrial fibrillation: Secondary | ICD-10-CM

## 2018-08-02 DIAGNOSIS — E782 Mixed hyperlipidemia: Secondary | ICD-10-CM

## 2018-08-02 DIAGNOSIS — R002 Palpitations: Secondary | ICD-10-CM

## 2018-08-02 MED ORDER — VERAPAMIL HCL ER 240 MG PO TBCR: 360.0000 mg | EXTENDED_RELEASE_TABLET | Freq: Every day | ORAL | 3 refills | Status: DC

## 2018-08-02 NOTE — Patient Instructions (Signed)
It was a pleasure to speak with you on the phone today! Thank you for allowing [place] to continue taking care of your [facility] needs during this time.   Feel free to call as needed for questions and concerns related to your cardiac needs.   Medication Instructions:  Your physician has recommended you make the following change in your medication:  1- STOP Metoprolol 2- INCREASE Verapamil Take 1.5 tablets (360 mg total) by mouth at bedtime.  If you need a refill on your cardiac medications before your next appointment, please call your pharmacy.   Lab work: None ordered  If you have labs (blood work) drawn today and your tests are completely normal, you will receive your results only by: Marland Kitchen MyChart Message (if you have MyChart) OR . A paper copy in the mail If you have any lab test that is abnormal or we need to change your treatment, we will call you to review the results.  Testing/Procedures: None ordered  Follow-Up: At [facility], you and your health needs are our priority.  As part of our continuing mission to provide you with exceptional heart care, we have created designated Provider Care Teams.  These Care Teams include your primary Cardiologist (physician) and Advanced Practice Providers (APPs -  Physician Assistants and Nurse Practitioners) who all work together to provide you with the care you need, when you need it. You will need a follow up appointment in 6 months.  Please call our office 2 months in advance to schedule this appointment.  You may see Ida Rogue, MD or Christell Faith, PA-C.

## 2018-08-06 ENCOUNTER — Encounter: Payer: Self-pay | Admitting: Primary Care

## 2018-08-06 ENCOUNTER — Ambulatory Visit (INDEPENDENT_AMBULATORY_CARE_PROVIDER_SITE_OTHER): Payer: Managed Care, Other (non HMO) | Admitting: Primary Care

## 2018-08-06 ENCOUNTER — Other Ambulatory Visit: Payer: Self-pay

## 2018-08-06 ENCOUNTER — Other Ambulatory Visit: Payer: Self-pay | Admitting: Primary Care

## 2018-08-06 ENCOUNTER — Other Ambulatory Visit: Payer: Self-pay | Admitting: Cardiovascular Disease

## 2018-08-06 VITALS — BP 140/94 | HR 96 | Temp 99.1°F | Ht 66.0 in | Wt 180.5 lb

## 2018-08-06 DIAGNOSIS — R1032 Left lower quadrant pain: Secondary | ICD-10-CM | POA: Insufficient documentation

## 2018-08-06 DIAGNOSIS — E1159 Type 2 diabetes mellitus with other circulatory complications: Secondary | ICD-10-CM

## 2018-08-06 MED ORDER — CIPROFLOXACIN HCL 500 MG PO TABS: 500.0000 mg | ORAL_TABLET | Freq: Two times a day (BID) | ORAL | 0 refills | Status: DC

## 2018-08-06 MED ORDER — METRONIDAZOLE 500 MG PO TABS: 500.0000 mg | ORAL_TABLET | Freq: Three times a day (TID) | ORAL | 0 refills | Status: DC

## 2018-08-06 NOTE — Assessment & Plan Note (Signed)
Intermittent for the last five days, worse last night. Family history of diverticulitis. Evidence of small mouth diverticula on colonoscopy from 2019.  Given presentation, family history, and evidence of diverticula in area of pain will treat for presumed diverticulitis.   Rx for Cipro and Flagyl courses sent to pharmacy. Check labs including CBC and CMP.  Discussed clear liquid diet. Return/ED precautions provided.

## 2018-08-06 NOTE — Telephone Encounter (Signed)
Please review for refill. Thanks!  

## 2018-08-06 NOTE — Progress Notes (Signed)
Subjective:    Patient ID: Debra Perez, female    DOB: 1955/10/30, 63 y.o.   MRN: [id number]  HPI  Debra Perez is a 63 year old female with a history of hypertension, paroxysmal atrial fibrillation, CAD, type 2 diabetes, hypothyroidism, GERD, hyperlipidemia, atypical chest pain who presents today with a chief complaint of abdominal pain.  Her pain is located to the left lower abdomen that she first noticed five days ago. Her symptoms began with gas like pain, then progressed to pain, then chills, one episode of loose stool. She denies nausea, fevers, bloody stools. She thinks her symptoms began after eating spicy food, cucumbers, and tomatoes.    Her colonoscopy report from August 2019 showed multiple small mouthed diverticula to the sigmoid colon. She has a strong family history of diverticulitis in both of her siblings.   Review of Systems  Constitutional: Negative for fever.  Gastrointestinal: Positive for abdominal pain and diarrhea. Negative for blood in stool and nausea.  Genitourinary: Negative for dysuria, frequency and hematuria.       Past Medical History:  Diagnosis Date  . 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 Diabetes mellitus without complication ([place])   . Diverticulosis   . GERD (gastroesophageal reflux disease)   . History of colonic polyps    benign  . Hyperlipidemia   . Hypertension   . Hypothyroidism   . Lung infection   . Mitral valve prolapse   . NSVT (nonsustained ventricular tachycardia) ([place])   . Obstructive sleep apnea    on CPAP  . PAF (paroxysmal atrial fibrillation) ([place])    a.  On Eliquis; b. CHADS2VASc => 3 (HTN, DM, female)  . Sleep apnea      Social History   Socioeconomic History  . Marital status: Married    Spouse name: Not on file  . Number of children: Not on file  . Years of education: Not on file  . Highest education level: Not on file  Occupational History  . Occupation: retired  Scientific laboratory technician  . Financial  resource strain: Not on file  . Food insecurity:    Worry: Not on file    Inability: Not on file  . Transportation needs:    Medical: Not on file    Non-medical: Not on file  Tobacco Use  . Smoking status: Former Smoker    Packs/day: 1.00    Years: 20.00    Pack years: 20.00    Types: Cigarettes    Last attempt to quit: 03/07/2004    Years since quitting: 14.4  . Smokeless tobacco: Never Used  Substance and Sexual Activity  . Alcohol use: No    Alcohol/week: 0.0 standard drinks  . Drug use: No  . Sexual activity: Not on file  Lifestyle  . Physical activity:    Days per week: Not on file    Minutes per session: Not on file  . Stress: Not on file  Relationships  . Social connections:    Talks on phone: Not on file    Gets together: Not on file    Attends religious service: Not on file    Active member of club or organization: Not on file    Attends meetings of clubs or organizations: Not on file    Relationship status: Not on file  . Intimate partner violence:    Fear of current or ex partner: Not on file    Emotionally abused: Not on file  Physically abused: Not on file    Forced sexual activity: Not on file  Other Topics Concern  . Not on file  Social History Narrative   Married.   1 daughter.   Moved here from [place] due to husbands occupation.   She is a Corporate treasurer.   Enjoys sewing, gardening.    Past Surgical History:  Procedure Laterality Date  . ABDOMINAL HYSTERECTOMY  2002   ovaries remain  . CLAVICLE SURGERY  2007   right clavicle plate pins  . COLONOSCOPY WITH PROPOFOL N/A 10/17/2017   Procedure: COLONOSCOPY WITH PROPOFOL;  Surgeon: Jonathon Bellows, MD;  Location: [facility] ENDOSCOPY;  Service: Gastroenterology;  Laterality: N/A;  . ESOPHAGOGASTRODUODENOSCOPY (EGD) WITH PROPOFOL N/A 10/17/2017   Procedure: ESOPHAGOGASTRODUODENOSCOPY (EGD) WITH PROPOFOL;  Surgeon: Jonathon Bellows, MD;  Location: [facility] ENDOSCOPY;  Service: Gastroenterology;  Laterality: N/A;  . GIVENS  CAPSULE STUDY N/A 11/30/2017   Procedure: GIVENS CAPSULE STUDY;  Surgeon: Jonathon Bellows, MD;  Location: [facility] ENDOSCOPY;  Service: Gastroenterology;  Laterality: N/A;  . KNEE ARTHROSCOPY  2012    Family History  Problem Relation Age of Onset  . Depression Mother   . Sudden death Mother 34  . Heart attack Mother   . Arthritis Father   . Heart disease Father   . Hypertension Father   . Emphysema Father   . Bladder Cancer Sister   . Diabetes Sister        Type 2  . Diabetes Brother        type  . Hypertension Brother   . Emphysema Brother   . Heart disease Brother   . Diabetes Maternal Grandfather   . Bladder Cancer Maternal Grandmother   . Breast cancer Paternal Grandmother   . Cancer Paternal Grandfather        colon cancer  . Diabetes Paternal Grandfather     No Known Allergies  Current Outpatient Medications on File Prior to Visit  Medication Sig Dispense Refill  . ACCU-CHEK FASTCLIX LANCETS MISC Use as instructed to test blood sugar 2 times daily 300 each 3  . [name] 50 MG tablet TAKE 1 TABLET BY MOUTH AT BEDTIME 90 tablet 1  . Azelastine-Fluticasone (DYMISTA) 137-50 MCG/ACT SUSP Dymista 137 mcg-50 mcg/spray nasal spray    . b complex vitamins capsule Take 1 capsule by mouth 2 (two) times daily.    Marland Kitchen ezetimibe (ZETIA) 10 MG tablet TAKE 1 TABLET BY MOUTH DAILY. GENERIC EQUIVALENT FOR ZETIA. 90 tablet 2  . famotidine (PEPCID) 20 MG tablet Take 1 tablet (20 mg total) by mouth 2 (two) times daily. For heartburn. 180 tablet 3  . fluticasone (FLONASE) 50 MCG/ACT nasal spray fluticasone propionate 50 mcg/actuation nasal spray,suspension PRN    . glipiZIDE (GLUCOTROL XL) 5 MG 24 hr tablet TAKE 1 TABLET BY MOUTH DAILY WITH BREAKFAST 90 tablet 1  . glucose blood (ACCU-CHEK GUIDE) test strip Use to test blood sugar up to 2 times daily 300 each 1  . Iron-Vitamin C (VITRON-C) 65-125 MG TABS Take by mouth.    Marland Kitchen JANUMET 50-1000 MG tablet TAKE 1 TABLET BY MOUTH TWICE DAILY  WITH A MEAL 180 tablet 1  . levothyroxine (SYNTHROID, LEVOTHROID) 88 MCG tablet Take 1 tablet by mouth every morning with water only. No food or medications for 30 minutes. 90 tablet 1  . Magnesium 500 MG CAPS Take 1 capsule by mouth 2 (two) times daily.    . methocarbamol (ROBAXIN) 500 MG tablet Take 1 tablet (500 mg total) by mouth  every 8 (eight) hours as needed for muscle spasms. 15 tablet 0  . metroNIDAZOLE (METROGEL) 1 % gel metronidazole 1 % topical gel    . Multiple Vitamins-Minerals (MULTIVITAMIN ADULT PO) Take 1 capsule by mouth 2 (two) times daily.     . propafenone (RYTHMOL SR) 225 MG 12 hr capsule TAKE ONE CAPSULE BY MOUTH TWICE DAILY 180 capsule 3  . quinapril (ACCUPRIL) 20 MG tablet TAKE 1 TABLET BY MOUTH AT BEDTIME. GENERIC EQUIVALENT FOR ACCUPRIL. 90 tablet 3  . rosuvastatin (CRESTOR) 5 MG tablet TAKE 1 TABLET BY MOUTH DAILY GENERIC EQUIVALENT FOR CRESTOR 90 tablet 2  . venlafaxine XR (EFFEXOR-XR) 37.5 MG 24 hr capsule TAKE ONE CAPSULE BY MOUTH DAILY WITH BREAKFAST 90 capsule 2  . verapamil (CALAN-SR) 240 MG CR tablet Take 1.5 tablets (360 mg total) by mouth at bedtime. 135 tablet 3   Current Facility-Administered Medications on File Prior to Visit  Medication Dose Route Frequency Provider Last Rate Last Dose  . heparin lock flush 100 unit/mL  500 Units Intracatheter Once PRN Earlie Server, MD      . sodium chloride flush (NS) 0.9 % injection 10 mL  10 mL Intracatheter PRN Earlie Server, MD   10 mL at 10/06/17 1515    BP (!) 140/94   Pulse 96   Temp 99.1 F (37.3 C) (Tympanic)   Ht 5\' 6"  (1.676 m)   Wt 180 lb 8 oz (81.9 kg)   SpO2 98%   BMI 29.13 kg/m    Objective:   Physical Exam  Constitutional: She appears well-nourished.  Neck: Neck supple.  Cardiovascular: Normal rate and regular rhythm.  Respiratory: Effort normal and breath sounds normal.  GI: Normal appearance. Bowel sounds are increased. There is abdominal tenderness in the suprapubic area and left lower quadrant.     Skin: Skin is warm and dry.  Psychiatric: She has a normal mood and affect.           Assessment & Plan:

## 2018-08-06 NOTE — Telephone Encounter (Signed)
Pt's wt 81.6 kg, age 63, SCr 0.66, CrCl 116.77.

## 2018-08-06 NOTE — Patient Instructions (Signed)
Stop by the lab prior to leaving today. I will notify you of your results once received.   Start ciprofloxacin 500 mg tablets. Take 1 tablet by mouth twice daily for 10 days.   Start metronidazole 500 mg tablets. Take 1 tablet by mouth three times daily for 10 days.  Start a clear liquid diet for the next several days.  It was a pleasure to see you today!   Clear Liquid Diet, Adult A clear liquid diet is a diet that includes only liquids and semi-liquids that you can see through. You do not eat any food on this diet. Most people need to follow this diet for only a short period of time. You may need to follow a clear liquid diet if:  You develop a medical condition right before or after you have surgery.  You were not able to eat food for a long period of time.  You had a condition that gave you nausea, vomiting, or diarrhea.  You are going to have a procedure or exam to look at parts of your digestive system.  You are going to have bowel surgery. The usual goals of this diet are:  To rest the stomach and digestive system as much as possible.  To help you clear the digestive system before a procedure or exam.  To keep you hydrated.  To make sure you get some calories for energy.  To help you return to your normal eating routine. What are tips for following this plan?  A clear liquid is a liquid or semi-liquid, such as gelatin, that you can see through when you hold it up to a light.  A clear liquid diet does not provide all the nutrients that you need. It is important to choose a variety of the liquids or semi-liquids that are allowed on this diet. That way, you will get as many nutrients as possible.  If you are not sure whether you can have certain items, ask your health care provider. If you have difficulty swallowing thin liquids, you will need to thicken them to prevent breathing in (aspiration). What foods should I eat?   Water and flavored water.  Fruit juices  that do not have pulp, such as cranberry juice and apple juice.  Tea and coffee without milk or cream.  Clear bouillon or broth.  Broth-based soups that have been strained.  Flavored gelatin.  Honey.  Sugar water.  Ice or frozen ice pops that do not contain milk, yogurt, fruit pieces, or fruit pulp.  Clear sodas.  Clear sports drinks. The items listed above may not be a complete list of recommended foods and beverages. Contact a dietitian for more information. What food should I avoid?  Juices that have pulp.  Milk.  Cream or cream-based soups.  Yogurt.  Regular foods that are not clear liquids or semi-liquids. The items listed above may not be a complete list of foods and beverages to avoid. Contact a dietitian for more information. Questions to ask your health care provider:  How long do I need to follow this diet?  Are there any medicines that I should change while on this diet? Summary  A clear liquid diet is a diet that includes liquids and semi-liquids that you can see through.  The goal of this diet is to help you recover by resting your digestive system, keeping you hydrated, and providing nutrients.  Avoid liquids with milk, cream, or pulp while on this diet. This information is not intended to  replace advice given to you by your health care provider. Make sure you discuss any questions you have with your health care provider. Document Released: 02/21/2005 Document Revised: 08/14/2017 Document Reviewed: 08/14/2017 Elsevier Interactive Patient Education  2019 [employer].

## 2018-08-07 LAB — CBC WITH DIFFERENTIAL/PLATELET
Basophils Absolute: 0.1 10*3/uL (ref 0.0–0.1)
Basophils Relative: 1.2 % (ref 0.0–3.0)
Eosinophils Absolute: 0.1 10*3/uL (ref 0.0–0.7)
Eosinophils Relative: 1.6 % (ref 0.0–5.0)
HCT: 41.7 % (ref 36.0–46.0)
Hemoglobin: 13.9 g/dL (ref 12.0–15.0)
Lymphocytes Relative: 22.1 % (ref 12.0–46.0)
Lymphs Abs: 2 10*3/uL (ref 0.7–4.0)
MCHC: 33.3 g/dL (ref 30.0–36.0)
MCV: 93.7 fl (ref 78.0–100.0)
Monocytes Absolute: 0.8 10*3/uL (ref 0.1–1.0)
Monocytes Relative: 9.4 % (ref 3.0–12.0)
Neutro Abs: 5.9 10*3/uL (ref 1.4–7.7)
Neutrophils Relative %: 65.7 % (ref 43.0–77.0)
Platelets: 276 10*3/uL (ref 150.0–400.0)
RBC: 4.45 Mil/uL (ref 3.87–5.11)
RDW: 13.6 % (ref 11.5–15.5)
WBC: 9 10*3/uL (ref 4.0–10.5)

## 2018-08-07 LAB — COMPREHENSIVE METABOLIC PANEL
ALT: 17 U/L (ref 0–35)
AST: 14 U/L (ref 0–37)
Albumin: 3.9 g/dL (ref 3.5–5.2)
Alkaline Phosphatase: 59 U/L (ref 39–117)
BUN: 12 mg/dL (ref 6–23)
CO2: 30 mEq/L (ref 19–32)
Calcium: 8.8 mg/dL (ref 8.4–10.5)
Chloride: 102 mEq/L (ref 96–112)
Creatinine, Ser: 0.81 mg/dL (ref 0.40–1.20)
GFR: 72.01 mL/min (ref >60.00)
Glucose, Bld: 169 mg/dL — ABNORMAL HIGH (ref 70–99)
Potassium: 4.3 mEq/L (ref 3.5–5.1)
Sodium: 140 mEq/L (ref 135–145)
Total Bilirubin: 0.5 mg/dL (ref 0.2–1.2)
Total Protein: 6.8 g/dL (ref 6.0–8.3)

## 2018-08-13 ENCOUNTER — Encounter: Payer: Self-pay | Admitting: Physician Assistant

## 2018-08-13 ENCOUNTER — Other Ambulatory Visit: Payer: Self-pay

## 2018-08-13 ENCOUNTER — Telehealth: Payer: Self-pay | Admitting: Physician Assistant

## 2018-08-13 ENCOUNTER — Ambulatory Visit (INDEPENDENT_AMBULATORY_CARE_PROVIDER_SITE_OTHER): Payer: Managed Care, Other (non HMO) | Admitting: Physician Assistant

## 2018-08-13 VITALS — BP 146/82 | HR 85 | Ht 66.0 in | Wt 181.5 lb

## 2018-08-13 DIAGNOSIS — M546 Pain in thoracic spine: Secondary | ICD-10-CM | POA: Diagnosis not present

## 2018-08-13 DIAGNOSIS — I480 Paroxysmal atrial fibrillation: Secondary | ICD-10-CM | POA: Diagnosis not present

## 2018-08-13 DIAGNOSIS — I471 Supraventricular tachycardia: Secondary | ICD-10-CM | POA: Diagnosis not present

## 2018-08-13 DIAGNOSIS — R002 Palpitations: Secondary | ICD-10-CM

## 2018-08-13 DIAGNOSIS — I2510 Atherosclerotic heart disease of native coronary artery without angina pectoris: Secondary | ICD-10-CM

## 2018-08-13 DIAGNOSIS — I10 Essential (primary) hypertension: Secondary | ICD-10-CM

## 2018-08-13 DIAGNOSIS — E782 Mixed hyperlipidemia: Secondary | ICD-10-CM

## 2018-08-13 DIAGNOSIS — I77819 Aortic ectasia, unspecified site: Secondary | ICD-10-CM

## 2018-08-13 NOTE — Patient Instructions (Addendum)
Medication Instructions:  Your physician recommends that you continue on your current medications as directed. Please refer to the Current Medication list given to you today.  If you need a refill on your cardiac medications before your next appointment, please call your pharmacy.   Lab work: None ordered  If you have labs (blood work) drawn today and your tests are completely normal, you will receive your results only by: Marland Kitchen MyChart Message (if you have MyChart) OR . A paper copy in the mail If you have any lab test that is abnormal or we need to change your treatment, we will call you to review the results.  Testing/Procedures: 1- CTA Call to Schedule at your earliest convenience [phone] Date _______ Time_______ Non-Cardiac CT Angiography (CTA), is a special type of CT scan that uses a computer to produce multi-dimensional views of major blood vessels throughout the body. In CT angiography, a contrast material is injected through an IV to help visualize the blood vessels   Follow-Up: At [facility], you and your health needs are our priority.  As part of our continuing mission to provide you with exceptional heart care, we have created designated Provider Care Teams.  These Care Teams include your primary Cardiologist (physician) and Advanced Practice Providers (APPs -  Physician Assistants and Nurse Practitioners) who all work together to provide you with the care you need, when you need it. You will need a follow upvirtual appointment in 2 months. You may see Ida Rogue, MD or  Christell Faith, PA-C.

## 2018-08-13 NOTE — Telephone Encounter (Signed)
Verbal from [employer], [place]. Called pt to see if she can come in for in office appt today.  No answer, LMTCB.

## 2018-08-13 NOTE — Telephone Encounter (Signed)
Incoming call from patient. She was scheduled for 11:30AM today with Christell Faith, PA.       COVID-19 Pre-Screening Questions:  . In the past 7 to 10 days have you had a cough,  shortness of breath, headache, congestion, fever (100 or greater) body aches, chills, sore throat, or sudden loss of taste or sense of smell? NO . Have you been around anyone with known Covid 19. NO . Have you been around anyone who is awaiting Covid 19 test results in the past 7 to 10 days? NO . Have you been around anyone who has been exposed to Covid 19, or has mentioned symptoms of Covid 19 within the past 7 to 10 days? NO  If you have any concerns/questions about symptoms patients report during screening (either on the phone or at threshold). Contact the provider seeing the patient or DOD for further guidance.  If neither are available contact a member of the leadership team.   Pt aware of current clinic guidelines and agrees to follow.

## 2018-08-13 NOTE — Progress Notes (Signed)
Cardiology Clinic Note   Patient Name: Debra Perez Date of Encounter: 08/13/2018  Primary Care Provider:  Pleas [name], NP Primary Cardiologist:  Ida Rogue, MD  Chief Complaint    Back/ Neck pain  Past Medical History    Past Medical History:  Diagnosis Date   Carotid artery occlusion    20 % left side   Diabetes mellitus without complication ([place])    Diverticulosis    GERD (gastroesophageal reflux disease)    History of colonic polyps    benign   Hyperlipidemia    Hypertension    Hypothyroidism    Lung infection    Mitral valve prolapse    NSVT (nonsustained ventricular tachycardia) ([facility])    Obstructive sleep apnea    on CPAP   PAF (paroxysmal atrial fibrillation) ([place])    a.  On Eliquis; b. CHADS2VASc => 3 (HTN, DM, female)   Sleep apnea    Past Surgical History:  Procedure Laterality Date   ABDOMINAL HYSTERECTOMY  2002   ovaries remain   CLAVICLE SURGERY  2007   right clavicle plate pins   COLONOSCOPY WITH PROPOFOL N/A 10/17/2017   Procedure: COLONOSCOPY WITH PROPOFOL;  Surgeon: Jonathon Bellows, MD;  Location: [facility] ENDOSCOPY;  Service: Gastroenterology;  Laterality: N/A;   ESOPHAGOGASTRODUODENOSCOPY (EGD) WITH PROPOFOL N/A 10/17/2017   Procedure: ESOPHAGOGASTRODUODENOSCOPY (EGD) WITH PROPOFOL;  Surgeon: Jonathon Bellows, MD;  Location: [facility] ENDOSCOPY;  Service: Gastroenterology;  Laterality: N/A;   GIVENS CAPSULE STUDY N/A 11/30/2017   Procedure: GIVENS CAPSULE STUDY;  Surgeon: Jonathon Bellows, MD;  Location: [facility] ENDOSCOPY;  Service: Gastroenterology;  Laterality: N/A;   KNEE ARTHROSCOPY  2012    Allergies  No Known Allergies  History of Present Illness    Debra Perez is a 63 y.o. female who presents today for an evaluation of her back pain. She stated that her back pain radiated to both of her arms and into her neck. The pain lasted about 10 minutes then subsided. She was on the way to the ER with her husband and went home when the  pain went away.   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    Echocardiogram:03/09/2018  1. The left ventricle has normal systolic function of 09-73%. The cavity size is normal. There is no left ventricular wall thickness. Echo evidence of impaired relaxation diastolic filling patterns.  2. Normal left atrial size.  3. Normal right atrial size.  4. Normal tricuspid valve.  5. There is normal right ventricular systolic function. b Right ventricular systolic pressure could not be assessed.  Cardiac CT from 2016 showed an EF of 73%, ectatic ascending aorta measuring 3.72 cm in caliber, normal caliber of the descending segment, three-vessel CAD with mild multifocal calcific narrowing of the proximal LAD and proximal ramus intermedius. Mild calcific narrowing in the proximal RCA with minimal narrowing in the middle third. No critical or flow-limiting disease was identified.Echo from 2016 was technically limited due to poor acoustic windows with an EF of 60 to 65%, mild concentric LVH, diastolic dysfunction, left atrium upper limits of normal, mild posterior MVP, trace mitral regurgitation, RVSP 27 mmHg, no pericardial effusion.   Today she reports that the back pain has occurred during three separate occasions over the last 6 months.The other two episodes of back pain were brief and  lasted less than a minute. Miss Mahon also endorses having an acute episode of diverticulitis which she is  taking Cipro and Flagyl for. However, she has stopped taking the Flagyl d/t GI intolerance. She denies CP, SOB, lower extremity edema, dizziness, HA, orthopnea, and PND.   Home Medications    Prior to Admission medications   Medication Sig Start Date End Date Taking? Authorizing Provider  ACCU-CHEK FASTCLIX LANCETS MISC Use as instructed to test blood sugar 2 times daily 10/04/17    Pleas [name], NP  [name] 50 MG tablet TAKE 1 TABLET BY MOUTH AT BEDTIME 05/16/18   Pleas [name], NP  Azelastine-Fluticasone (DYMISTA) 137-50 MCG/ACT SUSP Dymista 137 mcg-50 mcg/spray nasal spray    [provider]  b complex vitamins capsule Take 1 capsule by mouth 2 (two) times daily.    [provider]  ciprofloxacin (CIPRO) 500 MG tablet Take 1 tablet (500 mg total) by mouth 2 (two) times daily. 08/06/18   Pleas [name], NP  ELIQUIS 5 MG TABS tablet TAKE 1 TABLET BY MOUTH TWICE DAILY 08/06/18   Minna Merritts, MD  ezetimibe (ZETIA) 10 MG tablet TAKE 1 TABLET BY MOUTH DAILY. GENERIC EQUIVALENT FOR ZETIA. 02/19/18   Minna Merritts, MD  famotidine (PEPCID) 20 MG tablet Take 1 tablet (20 mg total) by mouth 2 (two) times daily. For heartburn. 05/15/18   Pleas [name], NP  fluticasone (FLONASE) 50 MCG/ACT nasal spray fluticasone propionate 50 mcg/actuation nasal spray,suspension PRN    [provider]  glipiZIDE (GLUCOTROL XL) 5 MG 24 hr tablet TAKE 1 TABLET BY MOUTH DAILY WITH BREAKFAST 08/07/18   Pleas [name], NP  glucose blood (ACCU-CHEK GUIDE) test strip Use to test blood sugar up to 2 times daily 10/23/17   Pleas [name], NP  Iron-Vitamin C (VITRON-C) 65-125 MG TABS Take by mouth.    [provider]  JANUMET 50-1000 MG tablet TAKE 1 TABLET BY MOUTH TWICE DAILY WITH A MEAL 04/16/18   Pleas [name], NP  levothyroxine (SYNTHROID, LEVOTHROID) 88 MCG tablet Take 1 tablet by mouth every morning with water only. No food or medications for 30 minutes. 04/03/18   Pleas [name], NP  Magnesium 500 MG CAPS Take 1 capsule by mouth 2 (two) times daily.    [provider]  methocarbamol (ROBAXIN) 500 MG tablet Take 1 tablet (500 mg total) by mouth every 8 (eight) hours as needed for muscle spasms. 07/17/18   Pleas [name], NP  metroNIDAZOLE (FLAGYL) 500 MG tablet Take 1 tablet (500 mg total) by mouth 3 (three)  times daily. 08/06/18   Pleas [name], NP  metroNIDAZOLE (METROGEL) 1 % gel metronidazole 1 % topical gel    [provider]  Multiple Vitamins-Minerals (MULTIVITAMIN ADULT PO) Take 1 capsule by mouth 2 (two) times daily.     [provider]  propafenone (RYTHMOL SR) 225 MG 12 hr capsule TAKE ONE CAPSULE BY MOUTH TWICE DAILY 04/20/18   Minna Merritts, MD  quinapril (ACCUPRIL) 20 MG tablet TAKE 1 TABLET BY MOUTH AT BEDTIME. GENERIC EQUIVALENT FOR ACCUPRIL. 01/08/18 10/09/18  Minna Merritts, MD  rosuvastatin (CRESTOR) 5 MG tablet TAKE 1 TABLET BY MOUTH DAILY GENERIC EQUIVALENT FOR CRESTOR 05/15/18   Rise Mu, PA-C  venlafaxine XR (EFFEXOR-XR) 37.5 MG 24 hr capsule TAKE ONE CAPSULE BY MOUTH DAILY WITH BREAKFAST 05/28/18   Pleas [name], NP  verapamil (CALAN-SR) 240 MG CR tablet Take 1.5 tablets (360 mg total) by mouth at bedtime. 08/02/18   Rise Mu, PA-C    Family History  Family History  Problem Relation Age of Onset   Depression Mother    Sudden death Mother 13   Heart attack Mother    Arthritis Father    Heart disease Father    Hypertension Father    Emphysema Father    Bladder Cancer Sister    Diabetes Sister        Type 2   Diabetes Brother        type   Hypertension Brother    Emphysema Brother    Heart disease Brother    Diabetes Maternal Grandfather    Bladder Cancer Maternal Grandmother    Breast cancer Paternal Grandmother    Cancer Paternal Grandfather        colon cancer   Diabetes Paternal Grandfather    She indicated that her mother is deceased. She indicated that her father is deceased. She indicated that her sister is alive. She indicated that her brother is alive. She indicated that her maternal grandmother is deceased. She indicated that her maternal grandfather is deceased. She indicated that her paternal grandmother is deceased. She indicated that her paternal grandfather is deceased.  Social History      Social History   Socioeconomic History   Marital status: Married    Spouse name: Not on file   Number of children: Not on file   Years of education: Not on file   Highest education level: Not on file  Occupational History   Occupation: retired  Scientist, product/process development strain: Not on file   Food insecurity:    Worry: Not on file    Inability: Not on file   Transportation needs:    Medical: Not on file    Non-medical: Not on file  Tobacco Use   Smoking status: Former Smoker    Packs/day: 1.00    Years: 20.00    Pack years: 20.00    Types: Cigarettes    Last attempt to quit: 03/07/2004    Years since quitting: 14.4   Smokeless tobacco: Never Used  Substance and Sexual Activity   Alcohol use: No    Alcohol/week: 0.0 standard drinks   Drug use: No   Sexual activity: Not on file  Lifestyle   Physical activity:    Days per week: Not on file    Minutes per session: Not on file   Stress: Not on file  Relationships   Social connections:    Talks on phone: Not on file    Gets together: Not on file    Attends religious service: Not on file    Active member of club or organization: Not on file    Attends meetings of clubs or organizations: Not on file    Relationship status: Not on file   Intimate partner violence:    Fear of current or ex partner: Not on file    Emotionally abused: Not on file    Physically abused: Not on file    Forced sexual activity: Not on file  Other Topics Concern   Not on file  Social History Narrative   Married.   1 daughter.   Moved here from [place] due to husbands occupation.   She is a Corporate treasurer.   Enjoys sewing, gardening.     Review of Systems    General:  No chills, fever, night sweats or weight changes.  Cardiovascular:  No chest pain, dyspnea on exertion, edema, orthopnea, palpitations, paroxysmal nocturnal dyspnea. Dermatological: No rash, lesions/masses Respiratory: No cough, dyspnea  Urologic: No  hematuria, dysuria Abdominal:   No nausea, vomiting, diarrhea, bright red blood per rectum, melena, or hematemesis Neurologic:  No visual changes, wkns, changes in mental status. All other systems reviewed and are otherwise negative except as noted above.  Physical Exam    VS:  There were no vitals taken for this visit. , BMI There is no height or weight on file to calculate BMI. GEN: Well nourished, well developed, in no acute distress. HEENT: normal. Neck: Supple, no JVD, carotid bruits, or masses. Cardiac: RRR, no murmurs, rubs, or gallops. No clubbing, cyanosis, edema.  Radials/DP/PT 2+ and equal bilaterally.  Respiratory:  Respirations regular and unlabored, clear to auscultation bilaterally. GI: Soft, nontender, nondistended, BS + x 4. MS: no deformity or atrophy. Skin: warm and dry, no rash. Neuro:  Strength and sensation are intact. Psych: Normal affect.  Accessory Clinical Findings    ECG personally reviewed by me today- NSR - No acute changes  Assessment & Plan   1.  Ectatic ascending aorta  Obtain CTA chest, abdomen, pelvis  2. HTN Maintain SBP between 105-120 Continue quinapril 20 mg HS Continue verapamil 240 mg HS Continue to monitor home BP  3.PAF Continue propafenone 225 mg BID  4.CAD Contnue rosuvastatin 5 mg daily Continue ezetimibe 5 mg BID  Follow up in 3 months with Christell Faith PA-C  Deberah Pelton, NP 08/13/2018, 10:46 AM

## 2018-08-13 NOTE — Progress Notes (Signed)
Cardiology Office Note Date:  08/13/2018  Patient ID:  Debra Perez, DOB [birth year], MRN [id number] PCP:  Pleas [name], NP  Cardiologist:  Dr. Rockey Situ, MD    Chief Complaint: Back pain  History of Present Illness: Debra Perez is a 63 y.o. female with history of CAD medically managed as below, PAF on Eliquis,pSVT, NSVT (details unclear), ectatic ascending aorta measuring 3.72 cm in 2016,prior tobacco abuse for 20 years stopping greater than 13 years prior, DM2, anemia requiring iron infusions, hypertension, hyperlipidemia, hypothyroidism, sleep apnea on CPAP, and GERD who presents for evaluation of mid back pain.  Remote stress testing from 2005 in [place] showed no significant ischemia or prior MI, normal LV size and function with an EF of 80%, adequate exercise tolerance, good blood pressure and heart rate response to exercise. Normal study. Cardiac CT from 2016 showed an EF of 73%, ectatic ascending aorta measuring 3.72 cm in caliber, normal caliber of the descending segment, three-vessel CAD with mild multifocal calcific narrowing of the proximal LAD and proximal ramus intermedius. Mild calcific narrowing in the proximal RCA with minimal narrowing in the middle third. No critical or flow-limiting disease was identified.Echo from 2016 was technically limited due to poor acoustic windows with an EF of 60 to 65%, mild concentric LVH, diastolic dysfunction, left atrium upper limits of normal, mild posterior MVP, trace mitral regurgitation, RVSP 27 mmHg, no pericardial effusion. Patient has previously attributed her A. fib to a URI while on steroids and albuterol. She has previously not tolerated scheduled beta-blockers secondary to fatigue and reported weight gain. Multaqled to hair loss. She was seen in the office on 09/27/2017 and was maintaining sinus rhythm. She hadbeen maintained on Rythmol, Eliquis, and PRN metoprolol. In 11/2017 the patient was noting more palpitations.  She was advised to take metoprolol scheduled rather than as needed. Patient called the office on 1/6/2020noting increase in nocturnal palpitations over the past month.Patient's Apple Watch showed sinus rhythm with PACs/PVCs. EKG and rhythm strip in the office showed NSR with PACs/PVC. Labs were unrevealing. Her verapamil was increased to 180 mg and she was continued on Toprol 12.5 mg. Echo in 03/2018 showedan EF of 60 to 44%, diastolic dysfunction, normal size left atrium, normal size right atrium, no significant valvular abnormalities, aortic root 2.8 cm, ascending aorta 3.2 cm, aortic arch 2.8 cm. Outpatient cardiac monitoring showed an overall sinus rhythm with an average heart rate of 71 bpm. There were 6 SVT/atrial tachycardic runs with the fastest interval lasting 5 beats with a maximum rate of 167 bpm and the longest interval lasting 14 beats with an average rate of 127 bpm. Isolated PACs, atrial couplets, and atrial triplets were rare. Isolated PVCs were rare. Patient triggered events were either not associated with significant arrhythmia or with clusters of PACs.She was seen in the office in 04/2018 noting improvement in her palpitations following the titration of verapamil as above. Her verapamil was further increased to 240 mg daily given the above ectopy noted on cardiac monitoring and in the setting of suboptimally controlled BP. She was seen in telemedicine on 5/28 for follow up and was doing very well. We stopped her metoprolol secondary to fatigue and titrated her verapamil.   She sent a My Chart message on 08/12/2018 noting an episode of sudden mid back pain that radiated to her bilateral arms without SOB or palpitations. She stated she had noted a similar episode a couple of weeks prior, though not as severe. She was ne route to the  ED on 6/7 for the above pain when it improved leading her to go back home.   She comes in today noting 3 episodes of mid back pain that radiate towards her  neck and across her bilateral shoulders. Her initial 2 episodes were short-lived, lasting 1-2 seconds with spontaneous resolution. Her most recent episode occurred on 6/7 and lasted 10 minutes with spontaneous resolution. Pain is not exertional. There is no chest pain associated with the above. She did note some mild bilateral hand numbness with her episode on 6/7. No associated SOB, palpitations, presyncope, or syncope. She feels well this morning and is preparing to clean the house. Lastly, since stopping metoprolol, her fatigue has resolved. Currently, asymptomatic.   Labs:  03/2018-TSH normal, magnesium 2.2, CBC unremarkable, serum creatinine 0.66, potassium 4.8 01/2018-A1c 6.1 08/2017- LFT normal, LDL 46   Past Medical History:  Diagnosis Date   Carotid artery occlusion    20 % left side   Diabetes mellitus without complication (HCC)    Diverticulosis    GERD (gastroesophageal reflux disease)    History of colonic polyps    benign   Hyperlipidemia    Hypertension    Hypothyroidism    Lung infection    Mitral valve prolapse    NSVT (nonsustained ventricular tachycardia) (HCC)    Obstructive sleep apnea    on CPAP   PAF (paroxysmal atrial fibrillation) ([place])    a.  On Eliquis; b. CHADS2VASc => 3 (HTN, DM, female)   Sleep apnea     Past Surgical History:  Procedure Laterality Date   ABDOMINAL HYSTERECTOMY  2002   ovaries remain   CLAVICLE SURGERY  2007   right clavicle plate pins   COLONOSCOPY WITH PROPOFOL N/A 10/17/2017   Procedure: COLONOSCOPY WITH PROPOFOL;  Surgeon: Jonathon Bellows, MD;  Location: [facility] ENDOSCOPY;  Service: Gastroenterology;  Laterality: N/A;   ESOPHAGOGASTRODUODENOSCOPY (EGD) WITH PROPOFOL N/A 10/17/2017   Procedure: ESOPHAGOGASTRODUODENOSCOPY (EGD) WITH PROPOFOL;  Surgeon: Jonathon Bellows, MD;  Location: [facility] ENDOSCOPY;  Service: Gastroenterology;  Laterality: N/A;   GIVENS CAPSULE STUDY N/A 11/30/2017   Procedure: GIVENS CAPSULE STUDY;   Surgeon: Jonathon Bellows, MD;  Location: [facility] ENDOSCOPY;  Service: Gastroenterology;  Laterality: N/A;   KNEE ARTHROSCOPY  2012    Current Meds  Medication Sig   [name] 50 MG tablet TAKE 1 TABLET BY MOUTH AT BEDTIME   b complex vitamins capsule Take 1 capsule by mouth 2 (two) times daily.   ciprofloxacin (CIPRO) 500 MG tablet Take 1 tablet (500 mg total) by mouth 2 (two) times daily.   ELIQUIS 5 MG TABS tablet TAKE 1 TABLET BY MOUTH TWICE DAILY   ezetimibe (ZETIA) 10 MG tablet TAKE 1 TABLET BY MOUTH DAILY. GENERIC EQUIVALENT FOR ZETIA.   famotidine (PEPCID) 20 MG tablet Take 1 tablet (20 mg total) by mouth 2 (two) times daily. For heartburn.   fluticasone (FLONASE) 50 MCG/ACT nasal spray fluticasone propionate 50 mcg/actuation nasal spray,suspension PRN   glipiZIDE (GLUCOTROL XL) 5 MG 24 hr tablet TAKE 1 TABLET BY MOUTH DAILY WITH BREAKFAST   glucose blood (ACCU-CHEK GUIDE) test strip Use to test blood sugar up to 2 times daily   Iron-Vitamin C (VITRON-C) 65-125 MG TABS Take by mouth daily.    JANUMET 50-1000 MG tablet TAKE 1 TABLET BY MOUTH TWICE DAILY WITH A MEAL   levothyroxine (SYNTHROID, LEVOTHROID) 88 MCG tablet Take 1 tablet by mouth every morning with water only. No food or medications for 30 minutes.   Magnesium  500 MG CAPS Take 1 capsule by mouth 2 (two) times daily.   methocarbamol (ROBAXIN) 500 MG tablet Take 1 tablet (500 mg total) by mouth every 8 (eight) hours as needed for muscle spasms.   metroNIDAZOLE (METROGEL) 1 % gel Apply 1 application topically daily.    Multiple Vitamins-Minerals (MULTIVITAMIN ADULT PO) Take 1 capsule by mouth daily.    propafenone (RYTHMOL SR) 225 MG 12 hr capsule TAKE ONE CAPSULE BY MOUTH TWICE DAILY   quinapril (ACCUPRIL) 20 MG tablet TAKE 1 TABLET BY MOUTH AT BEDTIME. GENERIC EQUIVALENT FOR ACCUPRIL.   rosuvastatin (CRESTOR) 5 MG tablet TAKE 1 TABLET BY MOUTH DAILY GENERIC EQUIVALENT FOR CRESTOR   venlafaxine XR  (EFFEXOR-XR) 37.5 MG 24 hr capsule TAKE ONE CAPSULE BY MOUTH DAILY WITH BREAKFAST   verapamil (CALAN-SR) 240 MG CR tablet Take 1.5 tablets (360 mg total) by mouth at bedtime.    Allergies:   Patient has no known allergies.   Social History:  The patient  reports that she quit smoking about 14 years ago. Her smoking use included cigarettes. She has a 20.00 pack-year smoking history. She has never used smokeless tobacco. She reports that she does not drink alcohol or use drugs.   Family History:  The patient's family history includes Arthritis in her father; Bladder Cancer in her maternal grandmother and sister; Breast cancer in her paternal grandmother; Cancer in her paternal grandfather; Depression in her mother; Diabetes in her brother, maternal grandfather, paternal grandfather, and sister; Emphysema in her brother and father; Heart attack in her mother; Heart disease in her brother and father; Hypertension in her brother and father; Sudden death (age of onset: 24) in her mother.  ROS:   Review of Systems  Constitutional: Negative for chills, diaphoresis, fever, malaise/fatigue and weight loss.  HENT: Negative for congestion.   Eyes: Negative for discharge and redness.  Respiratory: Negative for cough, hemoptysis, sputum production, shortness of breath and wheezing.   Cardiovascular: Negative for chest pain, palpitations, orthopnea, claudication, leg swelling and PND.  Gastrointestinal: Negative for abdominal pain, blood in stool, heartburn, melena, nausea and vomiting.  Genitourinary: Negative for hematuria.  Musculoskeletal: Positive for back pain. Negative for falls and myalgias.  Skin: Negative for rash.  Neurological: Negative for dizziness, tingling, tremors, sensory change, speech change, focal weakness, loss of consciousness and weakness.  Endo/Heme/Allergies: Does not bruise/bleed easily.  Psychiatric/Behavioral: Negative for substance abuse. The patient is not nervous/anxious.     All other systems reviewed and are negative.    PHYSICAL EXAM:  VS:  BP (!) 146/82 (BP Location: Left Arm, Patient Position: Sitting, Cuff Size: Normal)    Pulse 85    Ht 5\' 6"  (1.676 m)    Wt 181 lb 8 oz (82.3 kg)    SpO2 98%    BMI 29.29 kg/m  BMI: Body mass index is 29.29 kg/m.  Physical Exam  Constitutional: She is oriented to person, place, and time. She appears well-developed and well-nourished.  HENT:  Head: Normocephalic and atraumatic.  Eyes: Right eye exhibits no discharge. Left eye exhibits no discharge.  Neck: Normal range of motion. No JVD present.  Cardiovascular: Normal rate, regular rhythm, S1 normal, S2 normal and normal heart sounds. Exam reveals no distant heart sounds, no friction rub, no midsystolic click and no opening snap.  No murmur heard. Pulmonary/Chest: Effort normal and breath sounds normal. No respiratory distress. She has no decreased breath sounds. She has no wheezes. She has no rales. She exhibits no tenderness.  Abdominal: Soft. She exhibits no distension. There is no abdominal tenderness.  Musculoskeletal:        General: No edema.       Arms:  Neurological: She is alert and oriented to person, place, and time.  Skin: Skin is warm and dry. No cyanosis. Nails show no clubbing.  Psychiatric: She has a normal mood and affect. Her speech is normal and behavior is normal. Judgment and thought content normal.     EKG:  Was ordered and interpreted by me today. Shows NSR, 85 bpm, RBBB (unchanged from prior)  Recent Labs: 03/15/2018: Magnesium 2.2; TSH 1.400 08/06/2018: ALT 17; BUN 12; Creatinine, Ser 0.81; Hemoglobin 13.9; Platelets 276.0; Potassium 4.3; Sodium 140  08/24/2017: Cholesterol 123; HDL 61.40; LDL Cholesterol 46; Total CHOL/HDL Ratio 2; Triglycerides 78.0; VLDL 15.6   Estimated Creatinine Clearance: 79.9 mL/min (by C-G formula based on SCr of 0.81 mg/dL).   Wt Readings from Last 3 Encounters:  08/13/18 181 lb 8 oz (82.3 kg)  08/06/18 180 lb 8  oz (81.9 kg)  08/02/18 180 lb (81.6 kg)     Other studies reviewed: Additional studies/records reviewed today include: summarized above  ASSESSMENT AND PLAN:  1. Back pain: Appears to be consistent with spasm given duration. Obtain CTA chest/abdomen/pelvis given her history of ectatic ascending aorta.   2. Ectatic ascending aorta: Stable measurements by recent echo from 03/2018. CTA as above. Optimal BP/HR/lipid control.  3. Palpitations/PACs/PVCs/pSVT: Much improved following the initiation and titration of verapamil to 360 mg.  Fatigue resolved off metoprolol.  Recent labs including thyroid function and electrolytes were unrevealing.  4. PAF: Appears to be maintaining sinus rhythm with Rhythmol and verapamil.  Continue Eliquis given elevated CHADS2VASc as above.  5. CAD involving the native coronary arteries without angina: No chest pain with the above.  Assess coronary calcium on CTA.  Recent echo demonstrating preserved LVSF as above. On Eliquis in place of ASA. Continue Crestor and Zeita. Continue risk factor modification.  6. HTN: BP has been running in the 678L systolic at home.  Continue current dose of quinapril and verapamil.  7. HLD: LDL of 46 from 08/2017. Remains on Crestor and Zetia.  Disposition: F/u with Dr. Rockey Situ or an APP in 3 months.  Current medicines are reviewed at length with the patient today.  The patient did not have any concerns regarding medicines.  Signed, Christell Faith, PA-C 08/13/2018 11:30 AM     [place] [address] [place] [employer], [place] [address] [phone]

## 2018-08-13 NOTE — Telephone Encounter (Signed)
Can we get this patient scheduled today for in person office visit with me for chest/back pain?

## 2018-08-14 NOTE — Addendum Note (Signed)
Addended by: Janan Ridge on: 08/14/2018 08:14 AM   Modules accepted: Orders

## 2018-08-17 ENCOUNTER — Ambulatory Visit
Admission: RE | Admit: 2018-08-17 | Discharge: 2018-08-17 | Disposition: A | Discharge status: Home / Self Care | Payer: Managed Care, Other (non HMO) | Source: Ambulatory Visit | Attending: Physician Assistant | Admitting: Physician Assistant

## 2018-08-17 ENCOUNTER — Other Ambulatory Visit: Payer: Self-pay

## 2018-08-17 DIAGNOSIS — I77819 Aortic ectasia, unspecified site: Secondary | ICD-10-CM | POA: Insufficient documentation

## 2018-08-17 DIAGNOSIS — I2510 Atherosclerotic heart disease of native coronary artery without angina pectoris: Secondary | ICD-10-CM | POA: Insufficient documentation

## 2018-08-17 DIAGNOSIS — I10 Essential (primary) hypertension: Secondary | ICD-10-CM | POA: Insufficient documentation

## 2018-08-17 MED ORDER — IOHEXOL 350 MG/ML SOLN
75.0000 mL | Freq: Once | INTRAVENOUS | Status: AC | PRN
  Administered 2018-08-17: 75 mL via INTRAVENOUS

## 2018-08-20 ENCOUNTER — Telehealth: Payer: Self-pay

## 2018-08-20 NOTE — Telephone Encounter (Signed)
Call to patient to discuss results from CT.  She verbalized understanding and all questions were answered.   Advised pt to call for any further questions or concerns.

## 2018-08-20 NOTE — Telephone Encounter (Signed)
-----   Message from Rise Mu, PA-C sent at 08/17/2018  2:00 PM EDT ----- Aorta diameter is unchanged from prior study. No evidence of aneurysm, dissection, or other aortic pathology. Prior noted coronary artery calcification was again noted. Recommend aggressive risk factor modification with most recent LDL well controlled at 46 from 08/2017.

## 2018-08-27 NOTE — Telephone Encounter (Signed)
Pt seen 07/17/18.

## 2018-09-10 ENCOUNTER — Other Ambulatory Visit: Payer: Self-pay | Admitting: Physician Assistant

## 2018-09-10 MED ORDER — VERAPAMIL HCL ER 240 MG PO TBCR: 480.0000 mg | EXTENDED_RELEASE_TABLET | Freq: Every day | ORAL | 11 refills | Status: DC

## 2018-09-10 NOTE — Progress Notes (Signed)
Dose change to verapamil

## 2018-09-14 ENCOUNTER — Other Ambulatory Visit: Payer: Self-pay | Admitting: Primary Care

## 2018-09-14 DIAGNOSIS — E039 Hypothyroidism, unspecified: Secondary | ICD-10-CM

## 2018-09-17 ENCOUNTER — Ambulatory Visit
Admission: RE | Admit: 2018-09-17 | Discharge: 2018-09-17 | Disposition: A | Discharge status: Home / Self Care | Payer: Managed Care, Other (non HMO) | Source: Ambulatory Visit | Attending: Primary Care | Admitting: Primary Care

## 2018-09-17 ENCOUNTER — Other Ambulatory Visit: Payer: Self-pay

## 2018-09-17 DIAGNOSIS — Z1231 Encounter for screening mammogram for malignant neoplasm of breast: Secondary | ICD-10-CM | POA: Diagnosis present

## 2018-09-17 DIAGNOSIS — Z1239 Encounter for other screening for malignant neoplasm of breast: Secondary | ICD-10-CM

## 2018-09-24 ENCOUNTER — Other Ambulatory Visit: Payer: Self-pay

## 2018-09-24 ENCOUNTER — Other Ambulatory Visit (INDEPENDENT_AMBULATORY_CARE_PROVIDER_SITE_OTHER): Payer: Managed Care, Other (non HMO)

## 2018-09-24 DIAGNOSIS — E1159 Type 2 diabetes mellitus with other circulatory complications: Secondary | ICD-10-CM

## 2018-09-24 DIAGNOSIS — E782 Mixed hyperlipidemia: Secondary | ICD-10-CM

## 2018-09-24 DIAGNOSIS — M8580 Other specified disorders of bone density and structure, unspecified site: Secondary | ICD-10-CM

## 2018-09-24 LAB — VITAMIN D 25 HYDROXY (VIT D DEFICIENCY, FRACTURES): VITD: 51.35 ng/mL (ref 30.00–100.00)

## 2018-09-24 LAB — LIPID PANEL
Cholesterol: 128 mg/dL (ref 0–200)
HDL: 48.6 mg/dL (ref >39.00)
LDL Cholesterol: 52 mg/dL (ref 0–99)
NonHDL: 79.5
Total CHOL/HDL Ratio: 3
Triglycerides: 136 mg/dL (ref 0.0–149.0)
VLDL: 27.2 mg/dL (ref 0.0–40.0)

## 2018-09-24 LAB — HEMOGLOBIN A1C: Hgb A1c MFr Bld: 7 % — ABNORMAL HIGH (ref 4.6–6.5)

## 2018-10-05 ENCOUNTER — Other Ambulatory Visit: Payer: Self-pay | Admitting: Primary Care

## 2018-10-05 DIAGNOSIS — E119 Type 2 diabetes mellitus without complications: Secondary | ICD-10-CM

## 2018-10-18 NOTE — Progress Notes (Signed)
Cardiology Office Note    Date:  10/23/2018   ID:  [name], DOB 27-Nov-1955, MRN [id number]  PCP:  Pleas [name], NP  Cardiologist:  Ida Rogue, MD  Electrophysiologist:  None   Chief Complaint: Follow up  History of Present Illness:   Debra Perez is a 63 y.o. female with history of CAD medically managed as below, PAF on Eliquis,pSVT,NSVT (details unclear), ectatic ascending aorta measuring 3.8 x 3.6 cm in 08/2018 (esentially unchanged from prior study in 2016),prior tobacco abuse for 20 years stopping greater than 13 years ago, DM2, anemia requiring iron infusions, hypertension, hyperlipidemia, hypothyroidism, sleep apnea on CPAP, and GERD who presents for follow up of palpitations and hypertension.  Remote stress testing from 2005 in [place] showed no significant ischemia or prior MI, normal LV size and function with an EF of 80%, adequate exercise tolerance, good blood pressure and heart rate response to exercise. Normal study. Cardiac CT from 2016 showed an EF of 73%, ectatic ascending aorta measuring 3.72 cm in caliber, normal caliber of the descending segment, three-vessel CAD with mild multifocal calcific narrowing of the proximal LAD and proximal ramus intermedius. Mild calcific narrowing in the proximal RCA with minimal narrowing in the middle third. No critical or flow-limiting disease was identified.Echo from 2016 was technically limited due to poor acoustic windows with an EF of 60 to 65%, mild concentric LVH, diastolic dysfunction, left atrium upper limits of normal, mild posterior MVP, trace mitral regurgitation, RVSP 27 mmHg, no pericardial effusion. Patient has previously attributed her A. fib to a URI while on steroids and albuterol. She has previously not tolerated scheduled beta-blockers secondary to fatigue and reported weight gain. Multaqled to hair loss. She was seen in the office on 09/27/2017 and was maintaining sinus rhythm. In 11/2017, the  patient was noting more palpitations. She was advised to take metoprolol scheduled rather than as needed. Patient called the office on 1/6/2020noting increase in nocturnal palpitations over the past month.Patient's Apple Watch showed sinus rhythm with PACs/PVCs. EKG and rhythm strip in the office showed NSR with PACs/PVC. Labs were unrevealing. Her verapamil was increased to 180 mg and she was continued on Toprol 12.5 mg. Echo in 03/2018 showedan EF of 60 to 35%, diastolic dysfunction, normal size left atrium, normal size right atrium, no significant valvular abnormalities, aortic root 2.8 cm, ascending aorta 3.2 cm, aortic arch 2.8 cm. Outpatient cardiac monitoring showed an overall sinus rhythm with an average heart rate of 71 bpm. There were 6 SVT/atrial tachycardic runs with the fastest interval lasting 5 beats with a maximum rate of 167 bpm and the longest interval lasting 14 beats with an average rate of 127 bpm. Isolated PACs, atrial couplets, and atrial triplets were rare. Isolated PVCs were rare. Patient triggered events were either not associated with significant arrhythmia or with clusters of PACs.She was seen in the office in 04/2018 noting improvement in her palpitations following the titration of verapamil as above. Her verapamil was further increased to 240 mg daily given the above ectopy noted on cardiac monitoring and in the setting of suboptimally controlled BP. She was seen in telemedicine on 5/28 for follow up and was doing very well. We stopped her metoprolol secondary to fatigue and titrated her verapamil. She was seen in 08/2018 with several episodes of mid back pain that radiated towards her neck and across her bilateral shoulders and was not exertional. There was no chest pain. In this setting, she underwent CTA aorta on 08/17/2018 that showed  an essentially unchanged thoracic aorta measuring 3.8 x 3.6 cm with minimal aortic atherosclerosis and coronary artery calcification. In  09/2018, she noted some breakthrough palpitations with BP in the 182X systolic. In this setting, her verapamil was increased to 480 mg daily.   She comes in doing reasonably well today.  Since increasing her verapamil to 480 mg daily she has noticed increased fatigue.  She has tired of taking this medication and total in the morning and in the evening without improvement and fatigue.  She is now taking the verapamil 240 mg twice daily and still notes fatigue.  She has noted complete resolution of palpitations with the increased dose of verapamil.  Blood pressures are mostly running in the 130s over 70s at home.  She denies any chest pain, shortness of breath, dizziness, presyncope, or syncope.  Her thoracic back pain has resolved.  No lower extremity swelling, abdominal distention, orthopnea, PND, early satiety.  No falls, BRBPR, or melena since she was last seen.   Labs:  09/2018 - TC 128, TG 136, HDL 48, LDL 52, A1c 7.0 08/2018 - K+ 4.3, SCr 0.81, AST/ALT normal, HGB 13.9, PLT 276   Past Medical History:  Diagnosis Date   Carotid artery occlusion    20 % left side   Diabetes mellitus without complication (HCC)    Diverticulosis    GERD (gastroesophageal reflux disease)    History of colonic polyps    benign   Hyperlipidemia    Hypertension    Hypothyroidism    Lung infection    Mitral valve prolapse    NSVT (nonsustained ventricular tachycardia) (HCC)    Obstructive sleep apnea    on CPAP   PAF (paroxysmal atrial fibrillation) ([place])    a.  On Eliquis; b. CHADS2VASc => 3 (HTN, DM, female)   Sleep apnea     Past Surgical History:  Procedure Laterality Date   ABDOMINAL HYSTERECTOMY  2002   ovaries remain   CLAVICLE SURGERY  2007   right clavicle plate pins   COLONOSCOPY WITH PROPOFOL N/A 10/17/2017   Procedure: COLONOSCOPY WITH PROPOFOL;  Surgeon: Jonathon Bellows, MD;  Location: [facility] ENDOSCOPY;  Service: Gastroenterology;  Laterality: N/A;    ESOPHAGOGASTRODUODENOSCOPY (EGD) WITH PROPOFOL N/A 10/17/2017   Procedure: ESOPHAGOGASTRODUODENOSCOPY (EGD) WITH PROPOFOL;  Surgeon: Jonathon Bellows, MD;  Location: [facility] ENDOSCOPY;  Service: Gastroenterology;  Laterality: N/A;   GIVENS CAPSULE STUDY N/A 11/30/2017   Procedure: GIVENS CAPSULE STUDY;  Surgeon: Jonathon Bellows, MD;  Location: [facility] ENDOSCOPY;  Service: Gastroenterology;  Laterality: N/A;   KNEE ARTHROSCOPY  2012    Current Medications: Current Meds  Medication Sig   [name] 50 MG tablet TAKE 1 TABLET BY MOUTH AT BEDTIME   b complex vitamins capsule Take 1 capsule by mouth 2 (two) times daily.   ELIQUIS 5 MG TABS tablet TAKE 1 TABLET BY MOUTH TWICE DAILY   ezetimibe (ZETIA) 10 MG tablet TAKE 1 TABLET BY MOUTH DAILY. GENERIC EQUIVALENT FOR ZETIA.   famotidine (PEPCID) 20 MG tablet Take 1 tablet (20 mg total) by mouth 2 (two) times daily. For heartburn.   fluticasone (FLONASE) 50 MCG/ACT nasal spray fluticasone propionate 50 mcg/actuation nasal spray,suspension PRN   glipiZIDE (GLUCOTROL XL) 5 MG 24 hr tablet TAKE 1 TABLET BY MOUTH DAILY WITH BREAKFAST   glucose blood (ACCU-CHEK GUIDE) test strip Use to test blood sugar up to 2 times daily   Iron-Vitamin C (VITRON-C) 65-125 MG TABS Take by mouth daily.    JANUMET 50-1000 MG  tablet TAKE ONE TABLET BY MOUTH TWICE DAILY WITH A MEAL   levothyroxine (SYNTHROID) 88 MCG tablet TAKE 1 TABLET BY MOUTH EVERY MORNING WITH WATER ONLY. NO FOOD OR MEDICATIONS FOR 30 MINUTES   Magnesium 500 MG CAPS Take 1 capsule by mouth 2 (two) times daily.   Multiple Vitamins-Minerals (MULTIVITAMIN ADULT PO) Take 1 capsule by mouth daily.    propafenone (RYTHMOL SR) 225 MG 12 hr capsule TAKE ONE CAPSULE BY MOUTH TWICE DAILY   rosuvastatin (CRESTOR) 5 MG tablet TAKE 1 TABLET BY MOUTH DAILY GENERIC EQUIVALENT FOR CRESTOR   venlafaxine XR (EFFEXOR-XR) 37.5 MG 24 hr capsule TAKE ONE CAPSULE BY MOUTH DAILY WITH BREAKFAST   verapamil  (CALAN-SR) 240 MG CR tablet Take 2 tablets (480 mg total) by mouth daily. (Patient taking differently: Take 240 mg by mouth 2 (two) times daily. )    Allergies:   Patient has no known allergies.   Social History   Socioeconomic History   Marital status: Married    Spouse name: Not on file   Number of children: Not on file   Years of education: Not on file   Highest education level: Not on file  Occupational History   Occupation: retired  Scientist, product/process development strain: Not on file   Food insecurity    Worry: Not on file    Inability: Not on Lexicographer needs    Medical: Not on file    Non-medical: Not on file  Tobacco Use   Smoking status: Former Smoker    Packs/day: 1.00    Years: 20.00    Pack years: 20.00    Types: Cigarettes    Quit date: 03/07/2004    Years since quitting: 14.6   Smokeless tobacco: Never Used  Substance and Sexual Activity   Alcohol use: No    Alcohol/week: 0.0 standard drinks   Drug use: No   Sexual activity: Not on file  Lifestyle   Physical activity    Days per week: Not on file    Minutes per session: Not on file   Stress: Not on file  Relationships   Social connections    Talks on phone: Not on file    Gets together: Not on file    Attends religious service: Not on file    Active member of club or organization: Not on file    Attends meetings of clubs or organizations: Not on file    Relationship status: Not on file  Other Topics Concern   Not on file  Social History Narrative   Married.   1 daughter.   Moved here from [place] due to husbands occupation.   She is a Corporate treasurer.   Enjoys sewing, gardening.     Family History:  The patient's family history includes Arthritis in her father; Bladder Cancer in her maternal grandmother and sister; Breast cancer in her paternal grandmother; Cancer in her paternal grandfather; Depression in her mother; Diabetes in her brother, maternal grandfather, paternal  grandfather, and sister; Emphysema in her brother and father; Heart attack in her mother; Heart disease in her brother and father; Hypertension in her brother and father; Sudden death (age of onset: 67) in her mother.  ROS:   Review of Systems  Constitutional: Positive for malaise/fatigue. Negative for chills, diaphoresis, fever and weight loss.  HENT: Negative for congestion.   Eyes: Negative for discharge and redness.  Respiratory: Negative for cough, hemoptysis, sputum production, shortness of breath and wheezing.  Cardiovascular: Negative for chest pain, palpitations, orthopnea, claudication, leg swelling and PND.  Gastrointestinal: Negative for abdominal pain, blood in stool, heartburn, melena, nausea and vomiting.  Genitourinary: Negative for hematuria.  Musculoskeletal: Negative for falls and myalgias.  Skin: Negative for rash.  Neurological: Negative for dizziness, tingling, tremors, sensory change, speech change, focal weakness, loss of consciousness and weakness.  Endo/Heme/Allergies: Does not bruise/bleed easily.  Psychiatric/Behavioral: Negative for substance abuse. The patient is not nervous/anxious.   All other systems reviewed and are negative.    EKGs/Labs/Other Studies Reviewed:    Studies reviewed were summarized above. The additional studies were reviewed today:  2D Echo 03/2018: 1. The left ventricle has normal systolic function of 93-57%. The cavity size is normal. There is no left ventricular wall thickness. Echo evidence of impaired relaxation diastolic filling patterns.  2. Normal left atrial size.  3. Normal right atrial size.  4. Normal tricuspid valve.  5. There is normal right ventricular systolic function. b Right ventricular systolic pressure could not be assessed. __________  Elwyn Reach 03/2018: Normal sinus rhythm Average Heart rate: 71 bpm.   6 Supraventricular Tachycardia/atrial tachyardia runs occurred, the run with the fastest interval lasting 5 beats  with a max rate of 167 bpm,  the longest lasting 14 beats with an avg rate of 127 bpm.   Isolated SVEs were rare (<1.0%), SVE Couplets were rare (<1.0%), and SVE Triplets were rare (<1.0%).  Isolated VEs were rare (<1.0%), and no VE Couplets or VE Triplets were present.  Patient triggered events were either not associated with significant arrhythmia, or with clusters of APCs. __________  CTA aorta 08/2018: 1. The tubular ascending thoracic aorta measures up to 3.8 x 3.6 cm in caliber, unchanged from prior. No evidence of aneurysm, dissection, or other acute aortic pathology. Minimal atherosclerosis.  2.  Coronary artery disease.   EKG:  EKG is ordered today.  The EKG ordered today demonstrates NSR, 73 bpm, RBBB, nonspecific inferior ST-T changes (grossly unchanged)  Recent Labs: 03/15/2018: Magnesium 2.2; TSH 1.400 08/06/2018: ALT 17; BUN 12; Creatinine, Ser 0.81; Hemoglobin 13.9; Platelets 276.0; Potassium 4.3; Sodium 140  Recent Lipid Panel    Component Value Date/Time   CHOL 128 09/24/2018 0833   TRIG 136.0 09/24/2018 0833   HDL 48.60 09/24/2018 0833   CHOLHDL 3 09/24/2018 0833   VLDL 27.2 09/24/2018 0833   LDLCALC 52 09/24/2018 0833    PHYSICAL EXAM:    VS:  BP 120/70 (BP Location: Left Arm, Patient Position: Sitting, Cuff Size: Normal)    Pulse 73    Ht 5\' 6"  (1.676 m)    Wt 184 lb (83.5 kg)    BMI 29.70 kg/m   BMI: Body mass index is 29.7 kg/m.  Physical Exam  Constitutional: She is oriented to person, place, and time. She appears well-developed and well-nourished.  HENT:  Head: Normocephalic and atraumatic.  Eyes: Right eye exhibits no discharge. Left eye exhibits no discharge.  Neck: Normal range of motion. No JVD present.  Cardiovascular: Normal rate, regular rhythm, S1 normal, S2 normal and normal heart sounds. Exam reveals no distant heart sounds, no friction rub, no midsystolic click and no opening snap.  No murmur heard. Pulses:      Posterior tibial pulses  are 2+ on the right side and 2+ on the left side.  Pulmonary/Chest: Effort normal and breath sounds normal. No respiratory distress. She has no decreased breath sounds. She has no wheezes. She has no rales. She exhibits no tenderness.  Abdominal: Soft. She exhibits no distension. There is no abdominal tenderness.  Musculoskeletal:        General: No edema.  Neurological: She is alert and oriented to person, place, and time.  Skin: Skin is warm and dry. No cyanosis. Nails show no clubbing.  Psychiatric: She has a normal mood and affect. Her speech is normal and behavior is normal. Judgment and thought content normal.    Wt Readings from Last 3 Encounters:  10/23/18 184 lb (83.5 kg)  08/13/18 181 lb 8 oz (82.3 kg)  08/06/18 180 lb 8 oz (81.9 kg)     ASSESSMENT & PLAN:   1. Coronary artery calcification/aortic atherosclerosis: She is doing well without any symptoms concerning for angina.  Continue aggressive risk factor modification and primary prevention with Eliquis in place of aspirin, Crestor, and Zetia.  Add Bystolic as outlined below.  2. PAF/paroxysmal SVT/palpitations: No symptoms concerning for recurrence.  Given patient's fatigue following escalation of verapamil we will decrease verapamil to 240 mg daily and add Bystolic 5 mg daily to be taking in the evening in an effort to continue to suppress palpitations though control blood pressure and improve fatigue.  Continue Eliquis.  No symptoms concerning for bleeding.  Recent CBC demonstrated stable hemoglobin.  3. Ectatic ascending aorta: Stable by most recent CTA aorta as outlined above.  Continue optimal blood pressure and lipid control.  4. HTN: Blood pressures well controlled today.  Decrease verapamil to 240 mg daily as above with addition of Bystolic 5 mg every afternoon.  Continue Accupril 20 mg daily.  She will call back in 2 weeks to let us know if she is tolerating this regimen and if so, we will plan to further escalate  Bystolic and taper verapamil.  5. HLD: LDL of 52 from 09/2018.  Remains on Crestor and Zetia.  6. Fatigue: Medication adjustments as outlined above.  Recent TSH and CBC unrevealing.   Disposition: F/u with Dr. Rockey Situ or an APP in 2 months.   Medication Adjustments/Labs and Tests Ordered: Current medicines are reviewed at length with the patient today.  Concerns regarding medicines are outlined above. Medication changes, Labs and Tests ordered today are summarized above and listed in the Patient Instructions accessible in Encounters.   Signed, Christell Faith, PA-C 10/23/2018 7:57 AM     [facility] [address] [place] [address] [place], [place] [address] [phone]

## 2018-10-23 ENCOUNTER — Encounter: Payer: Self-pay | Admitting: Physician Assistant

## 2018-10-23 ENCOUNTER — Other Ambulatory Visit: Payer: Self-pay

## 2018-10-23 ENCOUNTER — Ambulatory Visit (INDEPENDENT_AMBULATORY_CARE_PROVIDER_SITE_OTHER): Payer: Managed Care, Other (non HMO) | Admitting: Physician Assistant

## 2018-10-23 VITALS — BP 120/70 | HR 73 | Ht 66.0 in | Wt 184.0 lb

## 2018-10-23 DIAGNOSIS — I700 Atherosclerosis of aorta: Secondary | ICD-10-CM | POA: Diagnosis not present

## 2018-10-23 DIAGNOSIS — I480 Paroxysmal atrial fibrillation: Secondary | ICD-10-CM | POA: Diagnosis not present

## 2018-10-23 DIAGNOSIS — I471 Supraventricular tachycardia: Secondary | ICD-10-CM

## 2018-10-23 DIAGNOSIS — R002 Palpitations: Secondary | ICD-10-CM

## 2018-10-23 DIAGNOSIS — I10 Essential (primary) hypertension: Secondary | ICD-10-CM

## 2018-10-23 DIAGNOSIS — I77819 Aortic ectasia, unspecified site: Secondary | ICD-10-CM

## 2018-10-23 DIAGNOSIS — I2510 Atherosclerotic heart disease of native coronary artery without angina pectoris: Secondary | ICD-10-CM

## 2018-10-23 DIAGNOSIS — R5383 Other fatigue: Secondary | ICD-10-CM

## 2018-10-23 DIAGNOSIS — E782 Mixed hyperlipidemia: Secondary | ICD-10-CM

## 2018-10-23 MED ORDER — NEBIVOLOL HCL 5 MG PO TABS: 5.0000 mg | ORAL_TABLET | Freq: Every day | ORAL | 11 refills | Status: DC

## 2018-10-23 MED ORDER — VERAPAMIL HCL ER 240 MG PO TBCR: 240.0000 mg | EXTENDED_RELEASE_TABLET | Freq: Every day | ORAL | 11 refills | Status: DC

## 2018-10-23 NOTE — Patient Instructions (Signed)
Medication Instructions:  Your physician has recommended you make the following change in your medication:  1- DECREASE Verapamil Take 1 tablet (240 mg total) by mouth daily. 2- START Bystolic Take 1 tablet (5 mg total) by mouth daily (evening).  If you need a refill on your cardiac medications before your next appointment, please call your pharmacy.   Lab work: None ordered  If you have labs (blood work) drawn today and your tests are completely normal, you will receive your results only by: Marland Kitchen MyChart Message (if you have MyChart) OR . A paper copy in the mail If you have any lab test that is abnormal or we need to change your treatment, we will call you to review the results.  Testing/Procedures: None ordered   Follow-Up: At [facility], you and your health needs are our priority.  As part of our continuing mission to provide you with exceptional heart care, we have created designated Provider Care Teams.  These Care Teams include your primary Cardiologist (physician) and Advanced Practice Providers (APPs -  Physician Assistants and Nurse Practitioners) who all work together to provide you with the care you need, when you need it. You will need a follow up appointment in 2 months. You may see Ida Rogue, MD or Christell Faith, PA-C.

## 2018-10-28 ENCOUNTER — Other Ambulatory Visit: Payer: Self-pay | Admitting: Primary Care

## 2018-10-28 ENCOUNTER — Other Ambulatory Visit: Payer: Self-pay | Admitting: Cardiovascular Disease

## 2018-11-09 ENCOUNTER — Other Ambulatory Visit: Payer: Self-pay | Admitting: *Deleted

## 2018-11-09 MED ORDER — NEBIVOLOL HCL 10 MG PO TABS: 10.0000 mg | ORAL_TABLET | Freq: Every day | ORAL | 3 refills | Status: DC

## 2018-12-04 ENCOUNTER — Ambulatory Visit (INDEPENDENT_AMBULATORY_CARE_PROVIDER_SITE_OTHER): Payer: Managed Care, Other (non HMO)

## 2018-12-04 DIAGNOSIS — Z23 Encounter for immunization: Secondary | ICD-10-CM | POA: Diagnosis not present

## 2018-12-06 IMAGING — US US PELVIS COMPLETE TRANSABD/TRANSVAG
1 series · 14 of 25 positions shown · non-contrast
Comparison: None

CLINICAL DATA: Six months of pelvic pain.  History of hysterectomy.



[Series 1: us pelvis complete transabd/transvag · 0.31mm/px · 14 of 42 slices shown]
[im 1/42]
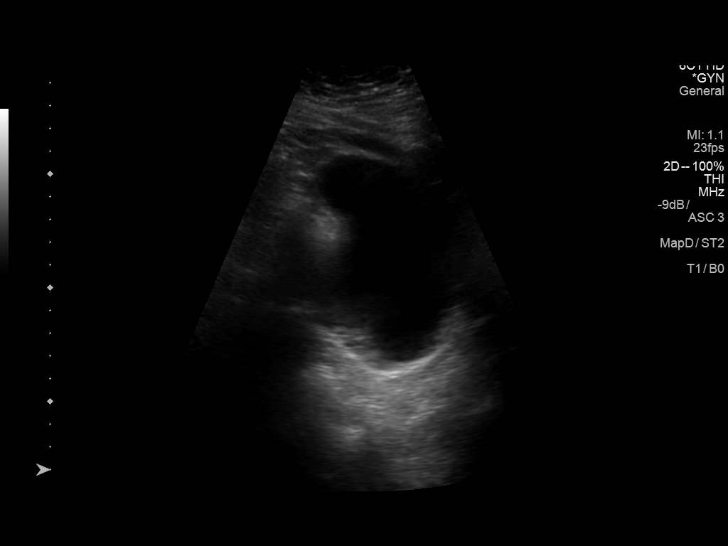
[im 4/42]
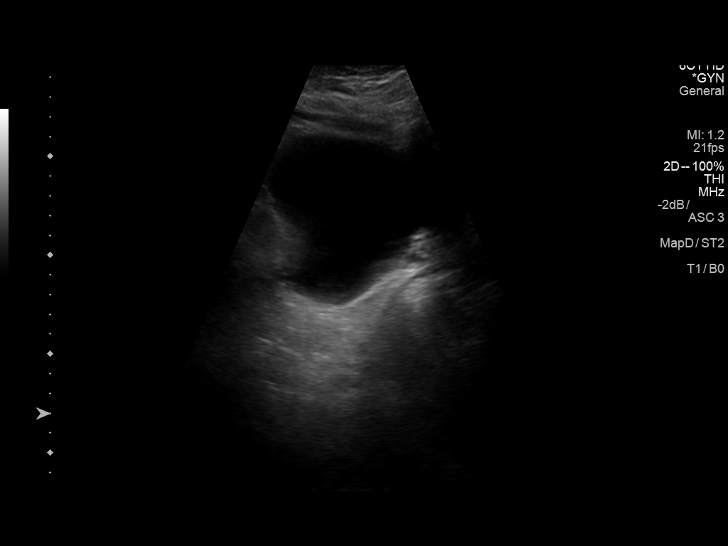
[im 7/42]
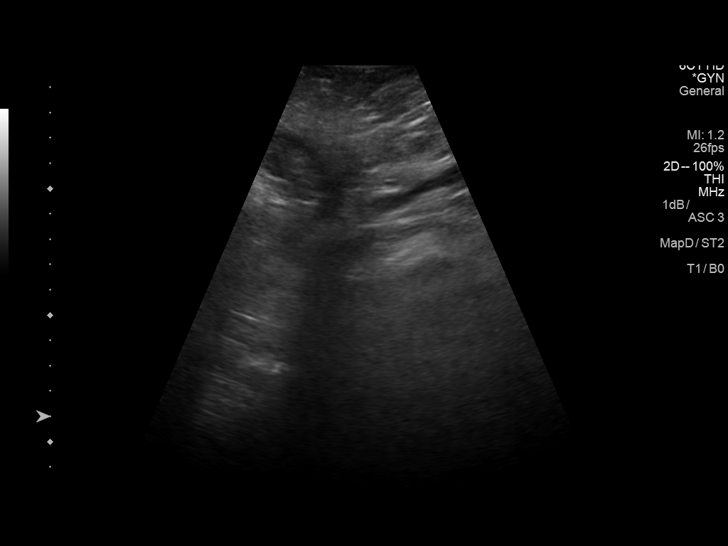
[im 11/42]
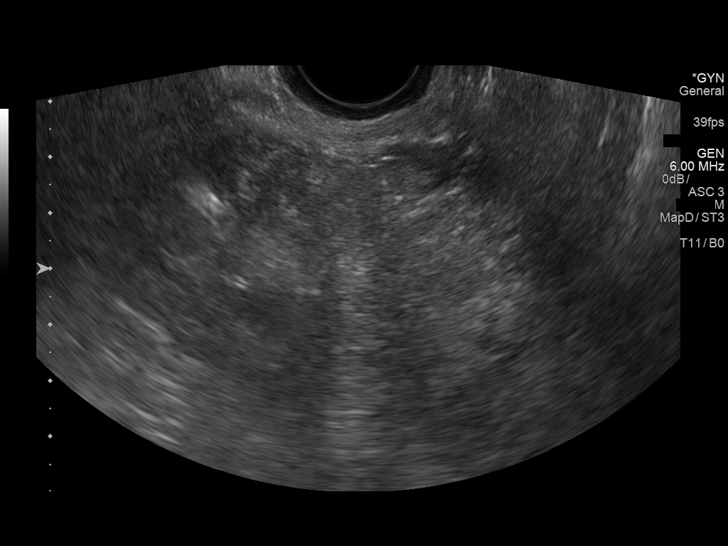
[im 14/42]
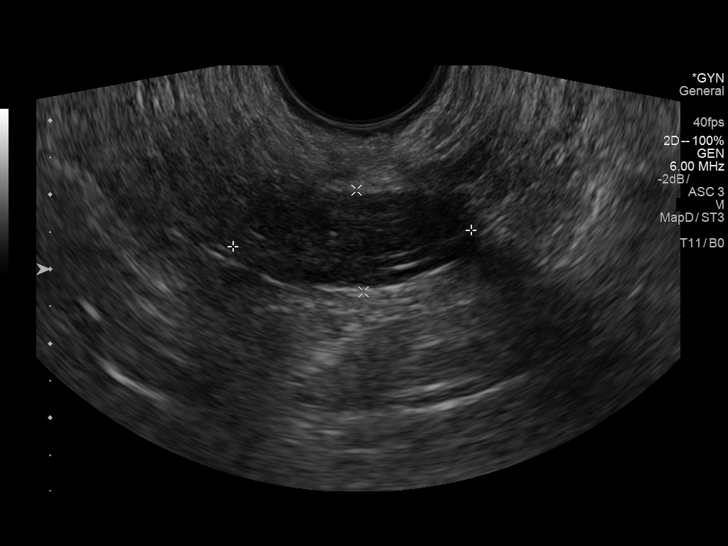
[im 16/42]
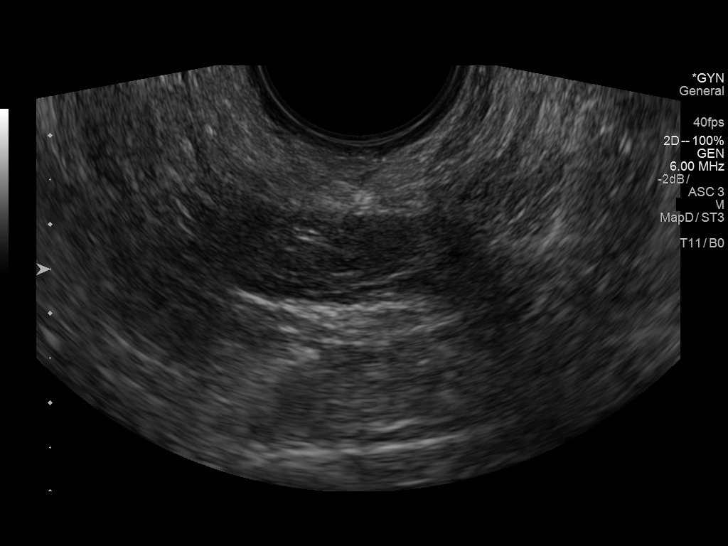
[im 19/42]
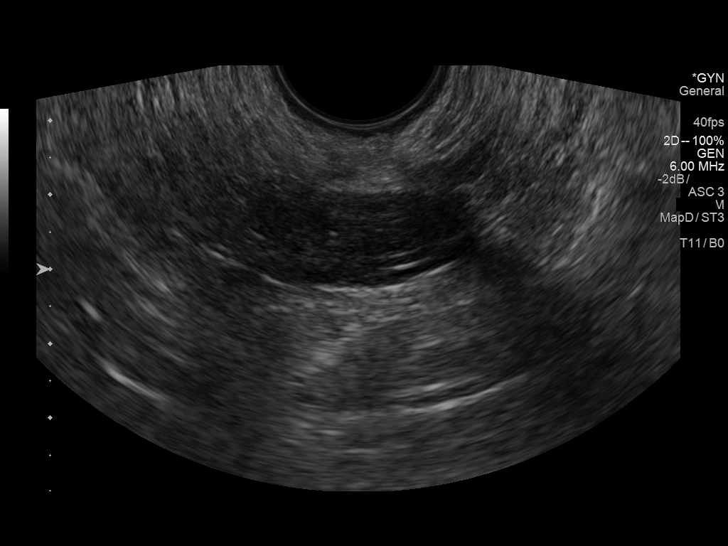
[im 23/42]
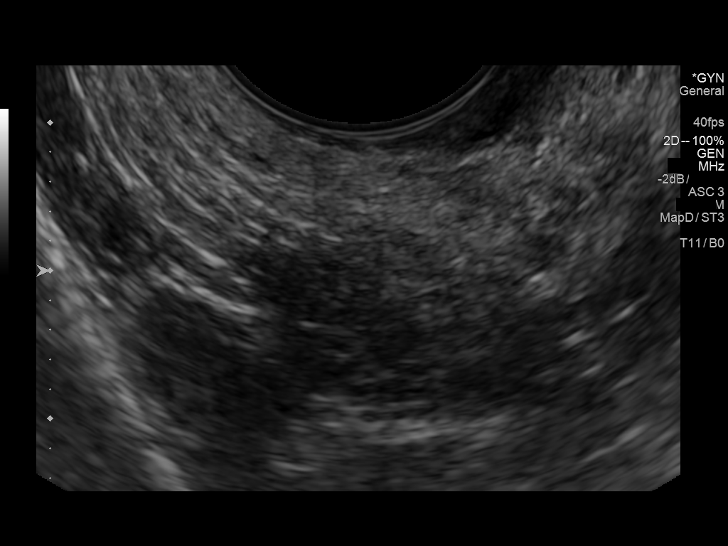
[im 26/42]
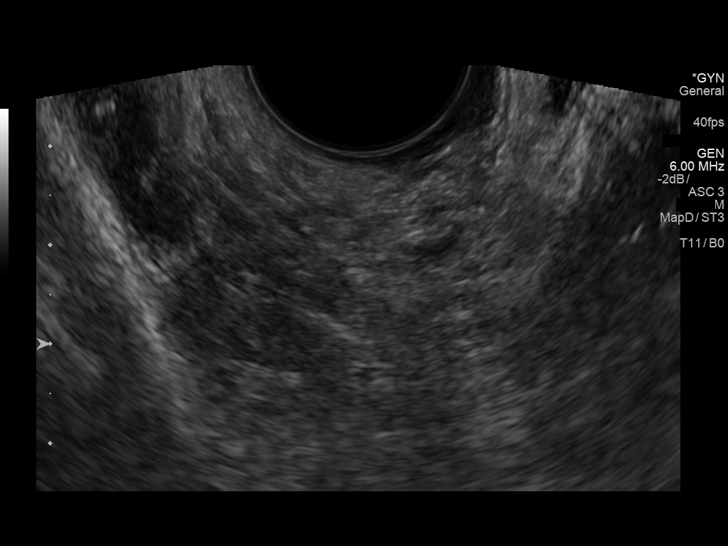
[im 28/42]
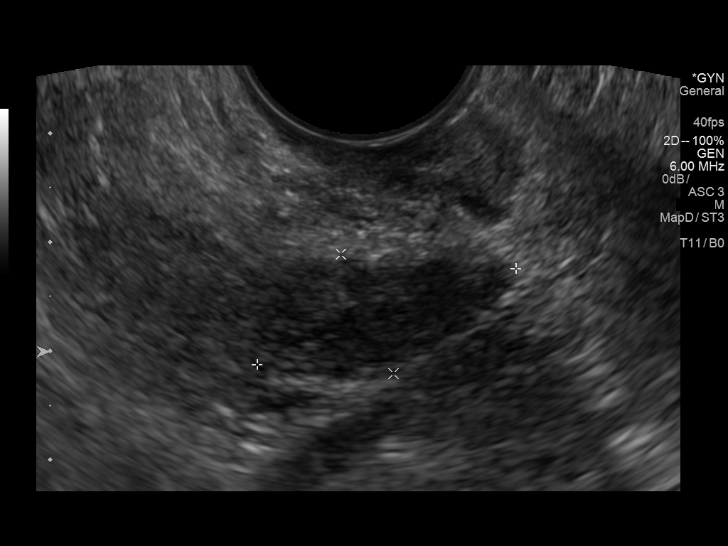
[im 31/42]
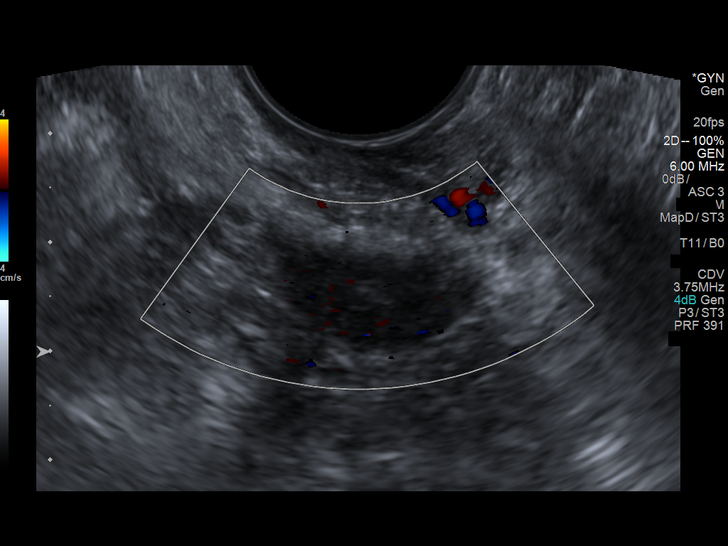
[im 35/42]
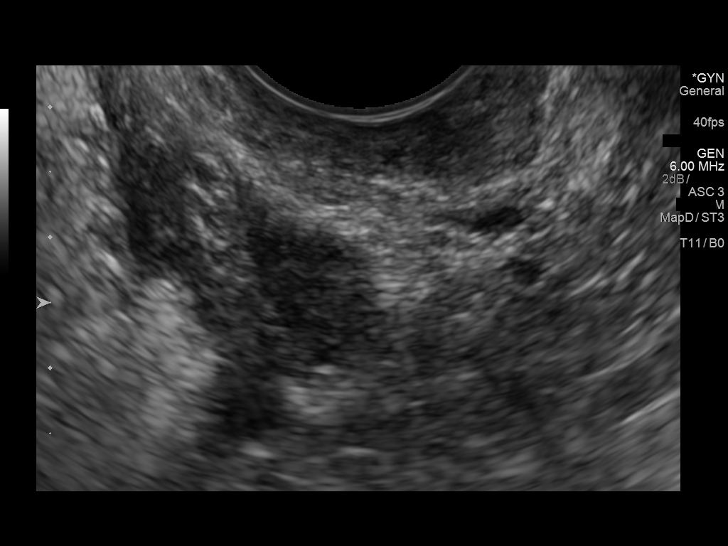
[im 38/42]
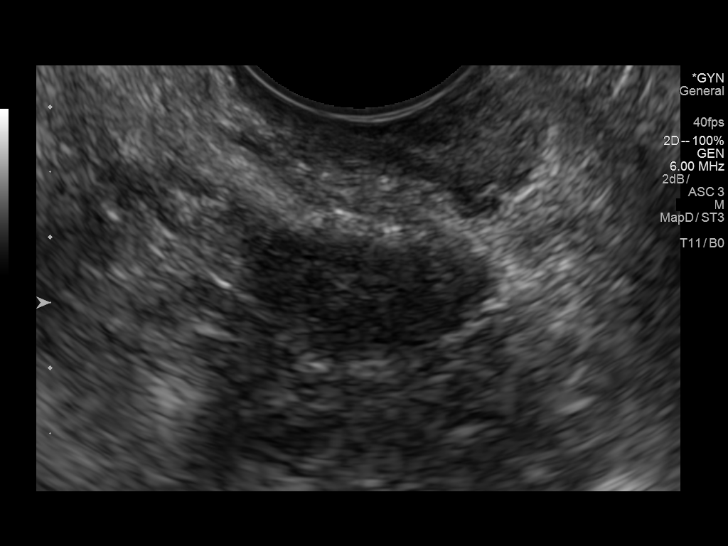
[im 42/42]
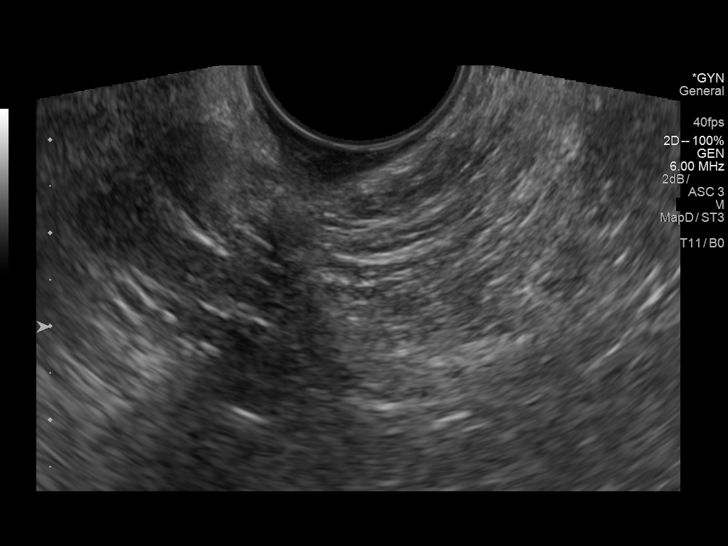

[14 of 25 positions shown; findings below may reference images not displayed]

FINDINGS: Uterus

The uterus is surgically absent.

Right ovary

Measurements: 3.2 x 1.4 x 1.0 cm.  No adnexal masses are observed.

Left ovary

Measurements: 2.5 x 1.2 x 0.9 cm.  No adnexal masses are observed.

Other findings

There is no free pelvic fluid.
IMPRESSION: Normal appearance of the ovaries. No adnexal masses or free pelvic
fluid. The uterus is surgically absent. Given the patient's
persistent symptoms, abdominal and pelvic CT scanning may be the
most useful next imaging step.

## 2018-12-23 NOTE — Progress Notes (Signed)
Patient ID: Debra Perez, female   DOB: [birth year], 63 y.o.   MRN: [id number] Cardiology Office Note  Date:  12/25/2018   ID:  Debra Perez, DOB [birth year], MRN [id number]  PCP:  Pleas [name], NP   Chief Complaint  Patient presents with  . other    2 month follow up. Meds reviewed by the pt. verbally. "doing well."     HPI:  Debra Perez is a pleasant 63 year old woman with history of  Diabetes II Paroxysmal atrial fibrillation, ChadsVASC 4 to 5 nonsustained VT/palpitations,  normal ejection fraction with EF greater than 60%,  mild three-vessel coronary artery disease on CTA scan,  20 years of smoking who stopped 13 years ago,  Upper respiratory infection February 2016  atrial fibrillation after inhaling albuterol while on prednisone,  evaluated in the hospital at that time with arrhythmia lasting for hours before converting back to normal sinus rhythm,  History of Obstructive sleep apnea, on CPAP Who presents for follow-up of her atrial fibrillation  Recent history of palpitations On prior clinic visits was started on verapamil with increasing dose up to 240 twice daily Had monitor and echo Echo normal Reported having fatigue and verapamil was decreased and she was started on bystolic Had worsening symptoms of fatigue on the bystolic She is taking metoprolol tartrate 12.5 daily  She denies having any significant side effects on the higher dose metoprolol Blood pressure continues to run high She reports weight has trended upwards over the past year Less active, no exercise, Hemoglobin A1c from 6 up to 7  Wonders if she needs to take Rythmol, Eliquis As far she knows Last episode of atrial fibrillation that she is aware of was 2016 after albuterol  Tolerating Crestor, cholesterol at goal  Lab work reviewed with her Hemoglobin A1c 7, one year earlier 6.1 Total cholesterol 128 LDL 52 Normal renal function  EKG personally reviewed by myself on todays  visit showsNo sinus rhythm rate 70 bpm nonspecific ST abnormality right bundle branch block  Other past medical history reviewed CT scan done in 2016 again reviewed with him showing mild coronary artery disease calcification   atrial fibrillation 01/16/2015 lasting at least 5 hours Possibly also 2 other very short episodes mention on her last clinic visit  metoprolol held for fatigue  multaq held for hair loss  started Rythmol 225 mg twice a day, SR dose, and on this regiment has felt better   Cardiac CTA with mild three-vessel CAD, ectatic ascending aorta measuring 3.72 cm  PFTs with mild obstructive lung disease    PMH:   has a past medical history of Carotid artery occlusion, Diabetes mellitus without complication ([place]), Diverticulosis, GERD (gastroesophageal reflux disease), History of colonic polyps, Hyperlipidemia, Hypertension, Hypothyroidism, Lung infection, Mitral valve prolapse, NSVT (nonsustained ventricular tachycardia) ([place]), Obstructive sleep apnea, PAF (paroxysmal atrial fibrillation) ([place]), and Sleep apnea.  PSH:    Past Surgical History:  Procedure Laterality Date  . ABDOMINAL HYSTERECTOMY  2002   ovaries remain  . CLAVICLE SURGERY  2007   right clavicle plate pins  . COLONOSCOPY WITH PROPOFOL N/A 10/17/2017   Procedure: COLONOSCOPY WITH PROPOFOL;  Surgeon: Jonathon Bellows, MD;  Location: [facility] ENDOSCOPY;  Service: Gastroenterology;  Laterality: N/A;  . ESOPHAGOGASTRODUODENOSCOPY (EGD) WITH PROPOFOL N/A 10/17/2017   Procedure: ESOPHAGOGASTRODUODENOSCOPY (EGD) WITH PROPOFOL;  Surgeon: Jonathon Bellows, MD;  Location: [facility] ENDOSCOPY;  Service: Gastroenterology;  Laterality: N/A;  . GIVENS CAPSULE STUDY N/A 11/30/2017   Procedure: GIVENS CAPSULE STUDY;  Surgeon: Jonathon Bellows, MD;  Location: [facility] ENDOSCOPY;  Service: Gastroenterology;  Laterality: N/A;  . KNEE ARTHROSCOPY  2012    Current Outpatient Medications  Medication Sig Dispense Refill  . [name] 50 MG  tablet TAKE 1 TABLET BY MOUTH AT BEDTIME 90 tablet 2  . b complex vitamins capsule Take 1 capsule by mouth 2 (two) times daily.    Marland Kitchen ELIQUIS 5 MG TABS tablet TAKE 1 TABLET BY MOUTH TWICE DAILY 180 tablet 1  . ezetimibe (ZETIA) 10 MG tablet TAKE 1 TABLET BY MOUTH DAILY. GENERIC EQUIVALENT FOR ZETIA 90 tablet 3  . famotidine (PEPCID) 20 MG tablet Take 1 tablet (20 mg total) by mouth 2 (two) times daily. For heartburn. 180 tablet 3  . fluticasone (FLONASE) 50 MCG/ACT nasal spray fluticasone propionate 50 mcg/actuation nasal spray,suspension PRN    . glipiZIDE (GLUCOTROL XL) 5 MG 24 hr tablet TAKE 1 TABLET BY MOUTH DAILY WITH BREAKFAST 90 tablet 1  . glucose blood (ACCU-CHEK GUIDE) test strip Use to test blood sugar up to 2 times daily 300 each 1  . Iron-Vitamin C (VITRON-C) 65-125 MG TABS Take by mouth daily.     Marland Kitchen JANUMET 50-1000 MG tablet TAKE ONE TABLET BY MOUTH TWICE DAILY WITH A MEAL 180 tablet 1  . levothyroxine (SYNTHROID) 88 MCG tablet TAKE 1 TABLET BY MOUTH EVERY MORNING WITH WATER ONLY. NO FOOD OR MEDICATIONS FOR 30 MINUTES 90 tablet 1  . Magnesium 500 MG CAPS Take 1 capsule by mouth 2 (two) times daily.    . metoprolol tartrate (LOPRESSOR) 25 MG tablet Take 12.5 mg by mouth every morning.    . Multiple Vitamins-Minerals (MULTIVITAMIN ADULT PO) Take 1 capsule by mouth daily.     . propafenone (RYTHMOL SR) 225 MG 12 hr capsule TAKE ONE CAPSULE BY MOUTH TWICE DAILY 180 capsule 3  . quinapril (ACCUPRIL) 20 MG tablet TAKE 1 TABLET BY MOUTH AT BEDTIME. GENERIC EQUIVALENT FOR ACCUPRIL. 90 tablet 3  . rosuvastatin (CRESTOR) 5 MG tablet TAKE 1 TABLET BY MOUTH DAILY GENERIC EQUIVALENT FOR CRESTOR 90 tablet 2  . venlafaxine XR (EFFEXOR-XR) 37.5 MG 24 hr capsule TAKE ONE CAPSULE BY MOUTH DAILY WITH BREAKFAST 90 capsule 2  . verapamil (CALAN-SR) 240 MG CR tablet Take 240 mg by mouth 2 (two) times daily.     No current facility-administered medications for this visit.    Facility-Administered  Medications Ordered in Other Visits  Medication Dose Route Frequency Provider Last Rate Last Dose  . heparin lock flush 100 unit/mL  500 Units Intracatheter Once PRN Earlie Server, MD      . sodium chloride flush (NS) 0.9 % injection 10 mL  10 mL Intracatheter PRN Earlie Server, MD   10 mL at 10/06/17 1515     Allergies:   Patient has no known allergies.   Social History:  The patient  reports that she quit smoking about 14 years ago. Her smoking use included cigarettes. She has a 20.00 pack-year smoking history. She has never used smokeless tobacco. She reports that she does not drink alcohol or use drugs.   Family History:   family history includes Arthritis in her father; Bladder Cancer in her maternal grandmother and sister; Breast cancer in her paternal grandmother; Cancer in her paternal grandfather; Depression in her mother; Diabetes in her brother, maternal grandfather, paternal grandfather, and sister; Emphysema in her brother and father; Heart attack in her mother; Heart disease in her brother and father; Hypertension in her brother and father; Sudden death (age of  onset: 39) in her mother.    Review of Systems: Review of Systems  Constitutional: Negative.   Respiratory: Negative.   Cardiovascular: Positive for palpitations.  Gastrointestinal: Negative.   Musculoskeletal: Negative.   Neurological: Negative.   Psychiatric/Behavioral: Negative.   All other systems reviewed and are negative.    PHYSICAL EXAM: VS:  BP 140/80 (BP Location: Left Arm, Patient Position: Sitting, Cuff Size: Normal)   Pulse 70   Ht 5\' 4"  (1.626 m)   Wt 184 lb 8 oz (83.7 kg)   BMI 31.67 kg/m  , BMI Body mass index is 31.67 kg/m. Constitutional:  oriented to person, place, and time. No distress.  HENT:  Head: Normocephalic and atraumatic.  Eyes:  no discharge. No scleral icterus.  Neck: Normal range of motion. Neck supple. No JVD present.  Cardiovascular: Normal rate, regular rhythm, normal heart sounds  and intact distal pulses. Exam reveals no gallop and no friction rub. No edema No murmur heard. Pulmonary/Chest: Effort normal and breath sounds normal. No stridor. No respiratory distress.  no wheezes.  no rales.  no tenderness.  Abdominal: Soft.  no distension.  no tenderness.  Musculoskeletal: Normal range of motion.  no  tenderness or deformity.  Neurological:  normal muscle tone. Coordination normal. No atrophy Skin: Skin is warm and dry. No rash noted. not diaphoretic.  Psychiatric:  normal mood and affect. behavior is normal. Thought content normal.    Recent Labs: 03/15/2018: Magnesium 2.2; TSH 1.400 08/06/2018: ALT 17; BUN 12; Creatinine, Ser 0.81; Hemoglobin 13.9; Platelets 276.0; Potassium 4.3; Sodium 140    Lipid Panel Lab Results  Component Value Date   CHOL 128 09/24/2018   HDL 48.60 09/24/2018   LDLCALC 52 09/24/2018   TRIG 136.0 09/24/2018      Wt Readings from Last 3 Encounters:  12/25/18 184 lb 8 oz (83.7 kg)  10/23/18 184 lb (83.5 kg)  08/13/18 181 lb 8 oz (82.3 kg)     ASSESSMENT AND PLAN:   Paroxysmal atrial fibrillation (HCC) - Plan: EKG 12-Lead  tolerating Rythmol and Eliquis We did started discussion of whether she would do reasonably well on verapamil and metoprolol alone We will continue to discuss with her whether we can come off Rythmol Eliquis  Essential hypertension -  Recommend she increase metoprolol tartrate 25 in the morning We did discuss this is typically a twice a day medication but we will start once a day for now She is taking verapamil twice a day, recommended she decrease verapamil down to 240 daily For elevated blood pressure suggested she try HCTZ 12.5 up to 25 mg daily.  She does have high fluid and salt intake.  Alternatively spironolactone could be added We did discuss possibly changing her to metoprolol succinate down the road Also discussed possibly changing to Accupril HCTZ combo pill if tolerated  NSVT (nonsustained  ventricular tachycardia) (HCC) denies any symptoms concerning for nonsustained VT Continue beta-blocker.  She is on verapamil  Hyperlipidemia Coronary calcifications seen on previous CT scan chest Stressed importance of aggressive diabetes control  Type 2 diabetes mellitus with other circulatory complication, without long-term current use of insulin (HCC) Weight trending upwards, recommend changes in diet as she did before to get A1c down to 6, numbers now 7 Previously was 8   Disposition:   F/U  6 months   Total encounter time more than 25 minutes  Greater than 50% was spent in counseling and coordination of care with the patient     Orders  Placed This Encounter  Procedures  . EKG 12-Lead     Signed, Esmond Plants, M.D., Ph.D. 12/25/2018  [place], [place]

## 2018-12-25 ENCOUNTER — Other Ambulatory Visit: Payer: Self-pay

## 2018-12-25 ENCOUNTER — Encounter: Payer: Self-pay | Admitting: Cardiovascular Disease

## 2018-12-25 ENCOUNTER — Ambulatory Visit (INDEPENDENT_AMBULATORY_CARE_PROVIDER_SITE_OTHER): Payer: Managed Care, Other (non HMO) | Admitting: Cardiovascular Disease

## 2018-12-25 VITALS — BP 140/80 | HR 70 | Ht 64.0 in | Wt 184.5 lb

## 2018-12-25 DIAGNOSIS — I10 Essential (primary) hypertension: Secondary | ICD-10-CM

## 2018-12-25 DIAGNOSIS — I480 Paroxysmal atrial fibrillation: Secondary | ICD-10-CM

## 2018-12-25 DIAGNOSIS — I471 Supraventricular tachycardia: Secondary | ICD-10-CM

## 2018-12-25 DIAGNOSIS — I700 Atherosclerosis of aorta: Secondary | ICD-10-CM | POA: Diagnosis not present

## 2018-12-25 DIAGNOSIS — I2510 Atherosclerotic heart disease of native coronary artery without angina pectoris: Secondary | ICD-10-CM | POA: Diagnosis not present

## 2018-12-25 DIAGNOSIS — E782 Mixed hyperlipidemia: Secondary | ICD-10-CM

## 2018-12-25 MED ORDER — VERAPAMIL HCL ER 240 MG PO TBCR: 240.0000 mg | EXTENDED_RELEASE_TABLET | Freq: Every day | ORAL | 3 refills | Status: DC

## 2018-12-25 MED ORDER — HYDROCHLOROTHIAZIDE 25 MG PO TABS: 25.0000 mg | ORAL_TABLET | Freq: Every day | ORAL | 3 refills | Status: DC

## 2018-12-25 MED ORDER — METOPROLOL TARTRATE 25 MG PO TABS: 25.0000 mg | ORAL_TABLET | Freq: Every morning | ORAL | 3 refills | Status: DC

## 2018-12-25 NOTE — Patient Instructions (Addendum)
Medication Instructions:  Please increase the metoprolol up to 25 mg daily Please start HCTZ 25 mg daily Decrease the verapamil down to 240 mg once day   If you need a refill on your cardiac medications before your next appointment, please call your pharmacy.    Lab work: No new labs needed   If you have labs (blood work) drawn today and your tests are completely normal, you will receive your results only by: Marland Kitchen MyChart Message (if you have MyChart) OR . A paper copy in the mail If you have any lab test that is abnormal or we need to change your treatment, we will call you to review the results.   Testing/Procedures: No new testing needed   Follow-Up: At [facility], you and your health needs are our priority.  As part of our continuing mission to provide you with exceptional heart care, we have created designated Provider Care Teams.  These Care Teams include your primary Cardiologist (physician) and Advanced Practice Providers (APPs -  Physician Assistants and Nurse Practitioners) who all work together to provide you with the care you need, when you need it.  . You will need a follow up appointment in 6 months .   Please call our office 2 months in advance to schedule this appointment.    . Providers on your designated Care Team:   . Murray Hodgkins, NP . Christell Faith, PA-C . Marrianne Mood, PA-C  Any Other Special Instructions Will Be Listed Below (If Applicable).  For educational health videos Log in to : [email or web address] Or : [email or web address], password : [email or web address]

## 2018-12-27 ENCOUNTER — Other Ambulatory Visit: Payer: Self-pay | Admitting: *Deleted

## 2018-12-27 MED ORDER — HYDROCHLOROTHIAZIDE 25 MG PO TABS: 25.0000 mg | ORAL_TABLET | Freq: Every day | ORAL | 3 refills | Status: DC

## 2019-01-08 ENCOUNTER — Other Ambulatory Visit: Payer: Self-pay

## 2019-01-08 ENCOUNTER — Ambulatory Visit (INDEPENDENT_AMBULATORY_CARE_PROVIDER_SITE_OTHER): Payer: Managed Care, Other (non HMO) | Admitting: Family Medicine

## 2019-01-08 ENCOUNTER — Encounter: Payer: Self-pay | Admitting: Family Medicine

## 2019-01-08 VITALS — BP 148/90 | HR 80 | Temp 98.6°F | Resp 18 | Wt 184.5 lb

## 2019-01-08 DIAGNOSIS — S39012A Strain of muscle, fascia and tendon of lower back, initial encounter: Secondary | ICD-10-CM | POA: Insufficient documentation

## 2019-01-08 NOTE — Assessment & Plan Note (Signed)
Discussed that this is likely MSK with pain localized in the buttock. Pt requested prednisone as this worked in the past and has failed muscle relaxants and cannot take NSAIDs due to Eliquis. Advised 1 week more of home exercises, tylenol, heat. Discussed that while prednisone would likely improve her symptoms it does not seem related to the spine and would come with increased risks and would like to avoid. Will consider trial of symptoms not improving in the next week.

## 2019-01-08 NOTE — Progress Notes (Signed)
Subjective:     Debra Perez is a 63 y.o. female presenting for Back Pain (flared up on 01/02/2019. Sciatica? no injury or trauma to the area)     HPI   #Back pain - flared up on 01/02/2019 - low back  - down the right side into the buttock - radiates to the hip and butt - no weakness, tingling, or numbness - no loss of control of bowel or bladder - Worse: sitting for too long - Improved: exercise - (in the past PT was helpful), still does her PT exercises - Medication: tylenol - Treatment: heating pad, topical medication  In the past with severe flares prednisone helped   Last time on prednisone was 5 months ago   Review of Systems  Constitutional: Negative for chills and fever.  Allergic/Immunologic: Environmental allergies:       Social History   Tobacco Use  Smoking Status Former Smoker  . Packs/day: 1.00  . Years: 20.00  . Pack years: 20.00  . Types: Cigarettes  . Quit date: 03/07/2004  . Years since quitting: 14.8  Smokeless Tobacco Never Used        Objective:    BP Readings from Last 3 Encounters:  01/08/19 (!) 148/90  12/25/18 140/80  10/23/18 120/70   Wt Readings from Last 3 Encounters:  01/08/19 184 lb 8 oz (83.7 kg)  12/25/18 184 lb 8 oz (83.7 kg)  10/23/18 184 lb (83.5 kg)    BP (!) 148/90   Pulse 80   Temp 98.6 F (37 C)   Resp 18   Wt 184 lb 8 oz (83.7 kg)   SpO2 96%   BMI 31.67 kg/m    Physical Exam Constitutional:      General: She is not in acute distress.    Appearance: She is well-developed. She is not diaphoretic.  HENT:     Right Ear: External ear normal.     Left Ear: External ear normal.     Nose: Nose normal.  Eyes:     Conjunctiva/sclera: Conjunctivae normal.  Neck:     Musculoskeletal: Neck supple.  Cardiovascular:     Rate and Rhythm: Normal rate.  Pulmonary:     Effort: Pulmonary effort is normal.  Musculoskeletal:     Comments: Back:  Inspection: no swelling or deformity Palpation: no spinous  or paraspinous TTP, some right sided buttock TTP ROM: pain in the buttock with forward flexion, but otherwise normal ROM w/o pain Strength: normal LE strength Straight leg: tightness in the right buttock, but no shooting pain or radiating symptoms Normal reflexes  Skin:    General: Skin is warm and dry.     Capillary Refill: Capillary refill takes less than 2 seconds.  Neurological:     Mental Status: She is alert. Mental status is at baseline.  Psychiatric:        Mood and Affect: Mood normal.        Behavior: Behavior normal.           Assessment & Plan:   Problem List Items Addressed This Visit      Musculoskeletal and Integument   Strain of lumbar region - Primary    Discussed that this is likely MSK with pain localized in the buttock. Pt requested prednisone as this worked in the past and has failed muscle relaxants and cannot take NSAIDs due to Eliquis. Advised 1 week more of home exercises, tylenol, heat. Discussed that while prednisone would likely improve her symptoms it  does not seem related to the spine and would come with increased risks and would like to avoid. Will consider trial of symptoms not improving in the next week.           Return if symptoms worsen or fail to improve.  Lesleigh Noe, MD

## 2019-01-08 NOTE — Patient Instructions (Signed)
#  Low back pain - continue exercises - continue tylenol and heating pad  If not better or still in significant pain in 1 week call or MyChart me and I will send in the prednisone medicine  Prednisone will increase blood sugar, blood pressure, and increases bleeding risk. The best solution is to avoid this medication as a treatment.

## 2019-01-18 ENCOUNTER — Telehealth: Payer: Self-pay

## 2019-01-18 MED ORDER — APIXABAN 5 MG PO TABS: 5.0000 mg | ORAL_TABLET | Freq: Two times a day (BID) | ORAL | 1 refills | Status: DC

## 2019-01-18 MED ORDER — ROSUVASTATIN CALCIUM 5 MG PO TABS: ORAL_TABLET | ORAL | 1 refills | Status: DC

## 2019-01-18 NOTE — Telephone Encounter (Signed)
Requested Prescriptions   Signed Prescriptions Disp Refills  . rosuvastatin (CRESTOR) 5 MG tablet 90 tablet 1    Sig: TAKE 1 TABLET BY MOUTH DAILY GENERIC EQUIVALENT FOR CRESTOR    Authorizing Provider: Minna Merritts    Ordering User: NEWCOMER MCCLAIN, Prestyn Stanco L

## 2019-01-18 NOTE — Telephone Encounter (Signed)
Eliquis 5mg  refill request received, pt is 63 yrs old, weight-83.7kg, Crea-0.81 on 08/06/2018, Diagnosis-Afib, and last seen by Dr. Rockey Situ on 12/25/2018. Dose is appropriate based on dosing criteria. Will send in refill to requested pharmacy.

## 2019-01-18 NOTE — Telephone Encounter (Signed)
RX resuest came in for patients Eliquis 5MG .  Take one tablet by mouth twice a day.   Please review for refill, Thanks !

## 2019-01-18 NOTE — Addendum Note (Signed)
Addended by: Derrel Nip B on: 01/18/2019 12:18 PM   Modules accepted: Orders

## 2019-01-28 ENCOUNTER — Other Ambulatory Visit: Payer: Self-pay | Admitting: Primary Care

## 2019-01-29 MED ORDER — ACCU-CHEK GUIDE VI STRP: ORAL_STRIP | 2 refills | Status: DC

## 2019-01-29 MED ORDER — ACCU-CHEK FASTCLIX LANCETS MISC: 2 refills | Status: DC

## 2019-01-29 NOTE — Addendum Note (Signed)
Addended by: Jacqualin Combes on: 01/29/2019 04:16 PM   Modules accepted: Orders

## 2019-02-26 ENCOUNTER — Other Ambulatory Visit: Payer: Self-pay | Admitting: Primary Care

## 2019-02-26 DIAGNOSIS — E039 Hypothyroidism, unspecified: Secondary | ICD-10-CM

## 2019-02-26 MED ORDER — LEVOTHYROXINE SODIUM 88 MCG PO TABS: ORAL_TABLET | ORAL | 1 refills | Status: DC

## 2019-03-03 ENCOUNTER — Other Ambulatory Visit: Payer: Self-pay | Admitting: Primary Care

## 2019-03-03 DIAGNOSIS — E1159 Type 2 diabetes mellitus with other circulatory complications: Secondary | ICD-10-CM

## 2019-03-03 DIAGNOSIS — R232 Flushing: Secondary | ICD-10-CM

## 2019-03-22 ENCOUNTER — Other Ambulatory Visit: Payer: Self-pay | Admitting: Lab

## 2019-03-25 ENCOUNTER — Other Ambulatory Visit: Payer: Self-pay

## 2019-03-25 DIAGNOSIS — R232 Flushing: Secondary | ICD-10-CM

## 2019-03-25 MED ORDER — VENLAFAXINE HCL ER 37.5 MG PO CP24: 37.5000 mg | ORAL_CAPSULE | Freq: Every day | ORAL | 0 refills | Status: DC

## 2019-03-27 ENCOUNTER — Other Ambulatory Visit: Payer: Self-pay | Admitting: Primary Care

## 2019-03-27 DIAGNOSIS — E119 Type 2 diabetes mellitus without complications: Secondary | ICD-10-CM

## 2019-03-27 MED ORDER — JANUMET 50-1000 MG PO TABS: 1.0000 | ORAL_TABLET | Freq: Two times a day (BID) | ORAL | 1 refills | Status: DC

## 2019-03-29 ENCOUNTER — Telehealth: Payer: Self-pay | Admitting: *Deleted

## 2019-03-29 NOTE — Telephone Encounter (Signed)
Per pt request to cancel 04/10/19 MD appt and stated that she will call back to R/S if needed after seen by her PCP

## 2019-04-01 ENCOUNTER — Telehealth: Payer: Self-pay | Admitting: Primary Care

## 2019-04-01 DIAGNOSIS — R232 Flushing: Secondary | ICD-10-CM

## 2019-04-01 MED ORDER — VENLAFAXINE HCL ER 37.5 MG PO CP24: 37.5000 mg | ORAL_CAPSULE | Freq: Every day | ORAL | 1 refills | Status: DC

## 2019-04-01 NOTE — Telephone Encounter (Signed)
Refilled as requested  

## 2019-04-01 NOTE — Telephone Encounter (Signed)
Pt is requesting a 90 day supply of venlafaxine to be sent to [facility].

## 2019-04-10 ENCOUNTER — Ambulatory Visit (INDEPENDENT_AMBULATORY_CARE_PROVIDER_SITE_OTHER): Payer: Managed Care, Other (non HMO) | Admitting: Primary Care

## 2019-04-10 ENCOUNTER — Encounter: Payer: Self-pay | Admitting: Primary Care

## 2019-04-10 ENCOUNTER — Other Ambulatory Visit: Payer: Self-pay

## 2019-04-10 ENCOUNTER — Ambulatory Visit: Payer: Managed Care, Other (non HMO) | Admitting: Oncology

## 2019-04-10 VITALS — BP 126/86 | HR 71 | Temp 96.6°F | Ht 64.0 in | Wt 183.6 lb

## 2019-04-10 DIAGNOSIS — E1159 Type 2 diabetes mellitus with other circulatory complications: Secondary | ICD-10-CM

## 2019-04-10 DIAGNOSIS — J309 Allergic rhinitis, unspecified: Secondary | ICD-10-CM | POA: Diagnosis not present

## 2019-04-10 DIAGNOSIS — Z23 Encounter for immunization: Secondary | ICD-10-CM | POA: Diagnosis not present

## 2019-04-10 LAB — POCT GLYCOSYLATED HEMOGLOBIN (HGB A1C): Hemoglobin A1C: 7.2 % — AB (ref 4.0–5.6)

## 2019-04-10 MED ORDER — FLUTICASONE PROPIONATE 50 MCG/ACT NA SUSP: 1.0000 | Freq: Two times a day (BID) | NASAL | 0 refills | Status: DC

## 2019-04-10 NOTE — Patient Instructions (Signed)
Start exercising. You should be getting 150 minutes of moderate intensity exercise weekly.  It is important that you improve your diet. Please limit carbohydrates in the form of white bread, rice, pasta, sweets, fast food, fried food, sugary drinks, etc. Increase your consumption of fresh fruits and vegetables, whole grains, lean protein.  Ensure you are consuming 64 ounces of water daily.  Please schedule a physical with me for 6 months. You may also schedule a lab only appointment 3-4 days prior. We will discuss your lab results in detail during your physical.  It was a pleasure to see you today!   Diabetes Mellitus and Nutrition, Adult When you have diabetes (diabetes mellitus), it is very important to have healthy eating habits because your blood sugar (glucose) levels are greatly affected by what you eat and drink. Eating healthy foods in the appropriate amounts, at about the same times every day, can help you:  Control your blood glucose.  Lower your risk of heart disease.  Improve your blood pressure.  Reach or maintain a healthy weight. Every person with diabetes is different, and each person has different needs for a meal plan. Your health care provider may recommend that you work with a diet and nutrition specialist (dietitian) to make a meal plan that is best for you. Your meal plan may vary depending on factors such as:  The calories you need.  The medicines you take.  Your weight.  Your blood glucose, blood pressure, and cholesterol levels.  Your activity level.  Other health conditions you have, such as heart or kidney disease. How do carbohydrates affect me? Carbohydrates, also called carbs, affect your blood glucose level more than any other type of food. Eating carbs naturally raises the amount of glucose in your blood. Carb counting is a method for keeping track of how many carbs you eat. Counting carbs is important to keep your blood glucose at a healthy level,  especially if you use insulin or take certain oral diabetes medicines. It is important to know how many carbs you can safely have in each meal. This is different for every person. Your dietitian can help you calculate how many carbs you should have at each meal and for each snack. Foods that contain carbs include:  Bread, cereal, rice, pasta, and crackers.  Potatoes and corn.  Peas, beans, and lentils.  Milk and yogurt.  Fruit and juice.  Desserts, such as cakes, cookies, ice cream, and candy. How does alcohol affect me? Alcohol can cause a sudden decrease in blood glucose (hypoglycemia), especially if you use insulin or take certain oral diabetes medicines. Hypoglycemia can be a life-threatening condition. Symptoms of hypoglycemia (sleepiness, dizziness, and confusion) are similar to symptoms of having too much alcohol. If your health care provider says that alcohol is safe for you, follow these guidelines:  Limit alcohol intake to no more than 1 drink per day for nonpregnant women and 2 drinks per day for men. One drink equals 12 oz of beer, 5 oz of wine, or 1 oz of hard liquor.  Do not drink on an empty stomach.  Keep yourself hydrated with water, diet soda, or unsweetened iced tea.  Keep in mind that regular soda, juice, and other mixers may contain a lot of sugar and must be counted as carbs. What are tips for following this plan?  Reading food labels  Start by checking the serving size on the "Nutrition Facts" label of packaged foods and drinks. The amount of calories, carbs,  fats, and other nutrients listed on the label is based on one serving of the item. Many items contain more than one serving per package.  Check the total grams (g) of carbs in one serving. You can calculate the number of servings of carbs in one serving by dividing the total carbs by 15. For example, if a food has 30 g of total carbs, it would be equal to 2 servings of carbs.  Check the number of grams  (g) of saturated and trans fats in one serving. Choose foods that have low or no amount of these fats.  Check the number of milligrams (mg) of salt (sodium) in one serving. Most people should limit total sodium intake to less than 2,300 mg per day.  Always check the nutrition information of foods labeled as "low-fat" or "nonfat". These foods may be higher in added sugar or refined carbs and should be avoided.  Talk to your dietitian to identify your daily goals for nutrients listed on the label. Shopping  Avoid buying canned, premade, or processed foods. These foods tend to be high in fat, sodium, and added sugar.  Shop around the outside edge of the grocery store. This includes fresh fruits and vegetables, bulk grains, fresh meats, and fresh dairy. Cooking  Use low-heat cooking methods, such as baking, instead of high-heat cooking methods like deep frying.  Cook using healthy oils, such as olive, canola, or sunflower oil.  Avoid cooking with butter, cream, or high-fat meats. Meal planning  Eat meals and snacks regularly, preferably at the same times every day. Avoid going long periods of time without eating.  Eat foods high in fiber, such as fresh fruits, vegetables, beans, and whole grains. Talk to your dietitian about how many servings of carbs you can eat at each meal.  Eat 4-6 ounces (oz) of lean protein each day, such as lean meat, chicken, fish, eggs, or tofu. One oz of lean protein is equal to: ? 1 oz of meat, chicken, or fish. ? 1 egg. ?  cup of tofu.  Eat some foods each day that contain healthy fats, such as avocado, nuts, seeds, and fish. Lifestyle  Check your blood glucose regularly.  Exercise regularly as told by your health care provider. This may include: ? 150 minutes of moderate-intensity or vigorous-intensity exercise each week. This could be brisk walking, biking, or water aerobics. ? Stretching and doing strength exercises, such as yoga or weightlifting, at  least 2 times a week.  Take medicines as told by your health care provider.  Do not use any products that contain nicotine or tobacco, such as cigarettes and e-cigarettes. If you need help quitting, ask your health care provider.  Work with a Social worker or diabetes educator to identify strategies to manage stress and any emotional and social challenges. Questions to ask a health care provider  Do I need to meet with a diabetes educator?  Do I need to meet with a dietitian?  What number can I call if I have questions?  When are the best times to check my blood glucose? Where to find more information:  American Diabetes Association: diabetes.org  Academy of Nutrition and Dietetics: www.eatright.[employer] of Diabetes and Digestive and Kidney Diseases (NIH): [employer] Summary  A healthy meal plan will help you control your blood glucose and maintain a healthy lifestyle.  Working with a diet and nutrition specialist (dietitian) can help you make a meal plan that is best for you.  Keep in  mind that carbohydrates (carbs) and alcohol have immediate effects on your blood glucose levels. It is important to count carbs and to use alcohol carefully. This information is not intended to replace advice given to you by your health care provider. Make sure you discuss any questions you have with your health care provider. Document Revised: 02/03/2017 Document Reviewed: 03/28/2016 Elsevier Patient Education  2020 [employer].

## 2019-04-10 NOTE — Assessment & Plan Note (Signed)
A1C of 7.2 today which is about the same as last visit. Overall under decent control, would like to see her <7. She agrees.  She will work on diet and exercise.  Foot exam today. Pneumonia vaccination due today. Eye exam UTD. Managed on statin and ACE.  Follow up in 6 months.

## 2019-04-10 NOTE — Addendum Note (Signed)
Addended by: Jacqualin Combes on: 04/10/2019 03:48 PM   Modules accepted: Orders

## 2019-04-10 NOTE — Progress Notes (Signed)
Subjective:    Patient ID: Debra Perez, female    DOB: 31-May-1955, 64 y.o.   MRN: [id number]  HPI  This visit occurred during the SARS-CoV-2 public health emergency.  Safety protocols were in place, including screening questions prior to the visit, additional usage of staff PPE, and extensive cleaning of exam room while observing appropriate contact time as indicated for disinfecting solutions.   Debra Perez is a 64 year old female with a history of hypertension, paroxysmal atrial fibrillation, CAD, GERD, diabetes, hypothyroidism, hyperlipidemia who presents today for follow up of diabetes. She would also like a refill of her Flonase.  Current medications include: Glipizide XL 5 mg, sitagliptin-metformin 50-1000 mg BID.   She is checking her blood glucose 2-3 times weekly and is getting readings of:  AM fasting 130's  Last A1C: 7.0 in July 2020, 7.2 today Last Eye Exam: Completed in 2020 Last Foot Exam: Due today Pneumonia Vaccination: Completed last in 2014, due today ACE/ARB: quinapril  Statin: Crestor   BP Readings from Last 3 Encounters:  04/10/19 126/86  01/08/19 (!) 148/90  12/25/18 140/80     Review of Systems  Eyes: Negative for visual disturbance.  Respiratory: Negative for shortness of breath.   Cardiovascular: Negative for chest pain.  Neurological: Negative for dizziness and headaches.       Past Medical History:  Diagnosis Date  . 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 Diabetes mellitus without complication ([place])   . Diverticulosis   . GERD (gastroesophageal reflux disease)   . History of colonic polyps    benign  . Hyperlipidemia   . Hypertension   . Hypothyroidism   . Lung infection   . Mitral valve prolapse   . NSVT (nonsustained ventricular tachycardia) ([place])   . Obstructive sleep apnea    on CPAP  . PAF (paroxysmal atrial fibrillation) ([place])    a.  On Eliquis; b. CHADS2VASc => 3 (HTN, DM, female)  . Sleep apnea      Social  History   Socioeconomic History  . Marital status: Married    Spouse name: Not on file  . Number of children: Not on file  . Years of education: Not on file  . Highest education level: Not on file  Occupational History  . Occupation: retired  Tobacco Use  . Smoking status: Former Smoker    Packs/day: 1.00    Years: 20.00    Pack years: 20.00    Types: Cigarettes    Quit date: 03/07/2004    Years since quitting: 15.1  . Smokeless tobacco: Never Used  Substance and Sexual Activity  . Alcohol use: No    Alcohol/week: 0.0 standard drinks  . Drug use: No  . Sexual activity: Not on file  Other Topics Concern  . Not on file  Social History Narrative   Married.   1 daughter.   Moved here from [place] due to husbands occupation.   She is a Corporate treasurer.   Enjoys sewing, gardening.   Social Determinants of Health   Financial Resource Strain:   . Difficulty of Paying Living Expenses: Not on file  Food Insecurity:   . Worried About Charity fundraiser in the Last Year: Not on file  . Ran Out of Food in the Last Year: Not on file  Transportation Needs:   . Lack of Transportation (Medical): Not on file  . Lack of Transportation (Non-Medical): Not on file  Physical Activity:   .  Days of Exercise per Week: Not on file  . Minutes of Exercise per Session: Not on file  Stress:   . Feeling of Stress : Not on file  Social Connections:   . Frequency of Communication with Friends and Family: Not on file  . Frequency of Social Gatherings with Friends and Family: Not on file  . Attends Religious Services: Not on file  . Active Member of Clubs or Organizations: Not on file  . Attends Archivist Meetings: Not on file  . Marital Status: Not on file  Intimate Partner Violence:   . Fear of Current or Ex-Partner: Not on file  . Emotionally Abused: Not on file  . Physically Abused: Not on file  . Sexually Abused: Not on file    Past Surgical History:  Procedure Laterality Date  .  ABDOMINAL HYSTERECTOMY  2002   ovaries remain  . CLAVICLE SURGERY  2007   right clavicle plate pins  . COLONOSCOPY WITH PROPOFOL N/A 10/17/2017   Procedure: COLONOSCOPY WITH PROPOFOL;  Surgeon: Jonathon Bellows, MD;  Location: [facility] ENDOSCOPY;  Service: Gastroenterology;  Laterality: N/A;  . ESOPHAGOGASTRODUODENOSCOPY (EGD) WITH PROPOFOL N/A 10/17/2017   Procedure: ESOPHAGOGASTRODUODENOSCOPY (EGD) WITH PROPOFOL;  Surgeon: Jonathon Bellows, MD;  Location: [facility] ENDOSCOPY;  Service: Gastroenterology;  Laterality: N/A;  . GIVENS CAPSULE STUDY N/A 11/30/2017   Procedure: GIVENS CAPSULE STUDY;  Surgeon: Jonathon Bellows, MD;  Location: [facility] ENDOSCOPY;  Service: Gastroenterology;  Laterality: N/A;  . KNEE ARTHROSCOPY  2012    Family History  Problem Relation Age of Onset  . Depression Mother   . Sudden death Mother 17  . Heart attack Mother   . Arthritis Father   . Heart disease Father   . Hypertension Father   . Emphysema Father   . Bladder Cancer Sister   . Diabetes Sister        Type 2  . Diabetes Brother        type  . Hypertension Brother   . Emphysema Brother   . Heart disease Brother   . Diabetes Maternal Grandfather   . Bladder Cancer Maternal Grandmother   . Breast cancer Paternal Grandmother   . Cancer Paternal Grandfather        colon cancer  . Diabetes Paternal Grandfather     No Known Allergies  Current Outpatient Medications on File Prior to Visit  Medication Sig Dispense Refill  . Accu-Chek FastClix Lancets MISC Use as instructed to test blood sugar 2 times daily 300 each 2  . [name] 50 MG tablet TAKE 1 TABLET BY MOUTH AT BEDTIME 90 tablet 2  . apixaban (ELIQUIS) 5 MG TABS tablet Take 1 tablet (5 mg total) by mouth 2 (two) times daily. 180 tablet 1  . b complex vitamins capsule Take 1 capsule by mouth 2 (two) times daily.    Marland Kitchen ezetimibe (ZETIA) 10 MG tablet TAKE 1 TABLET BY MOUTH DAILY. GENERIC EQUIVALENT FOR ZETIA 90 tablet 3  . famotidine (PEPCID) 20 MG tablet  Take 1 tablet (20 mg total) by mouth 2 (two) times daily. For heartburn. 180 tablet 3  . glipiZIDE (GLUCOTROL XL) 5 MG 24 hr tablet Take 1 tablet (5 mg total) by mouth daily with breakfast. MUST SCHEDULE OFFICE VISIT BEFORE NEXT REFILL 90 tablet 0  . glucose blood (ACCU-CHEK GUIDE) test strip Use to test blood sugar up to 2 times daily 300 each 2  . hydrochlorothiazide (HYDRODIURIL) 25 MG tablet Take 1 tablet (25 mg total) by mouth  daily. 90 tablet 3  . Iron-Vitamin C (VITRON-C) 65-125 MG TABS Take by mouth daily.     Marland Kitchen levothyroxine (SYNTHROID) 88 MCG tablet TAKE 1 TABLET BY MOUTH EVERY MORNING WITH WATER ONLY. NO FOOD OR MEDICATIONS FOR 30 MINUTES 90 tablet 1  . Magnesium 500 MG CAPS Take 1 capsule by mouth 2 (two) times daily.    . metoprolol tartrate (LOPRESSOR) 25 MG tablet Take 1 tablet (25 mg total) by mouth every morning. 90 tablet 3  . Multiple Vitamins-Minerals (MULTIVITAMIN ADULT PO) Take 1 capsule by mouth daily.     . propafenone (RYTHMOL SR) 225 MG 12 hr capsule TAKE ONE CAPSULE BY MOUTH TWICE DAILY 180 capsule 3  . quinapril (ACCUPRIL) 20 MG tablet TAKE 1 TABLET BY MOUTH AT BEDTIME. GENERIC EQUIVALENT FOR ACCUPRIL. 90 tablet 3  . rosuvastatin (CRESTOR) 5 MG tablet TAKE 1 TABLET BY MOUTH DAILY GENERIC EQUIVALENT FOR CRESTOR 90 tablet 1  . sitaGLIPtin-metformin (JANUMET) 50-1000 MG tablet Take 1 tablet by mouth 2 (two) times daily with a meal. 180 tablet 1  . venlafaxine XR (EFFEXOR-XR) 37.5 MG 24 hr capsule Take 1 capsule (37.5 mg total) by mouth daily with breakfast. 90 capsule 1  . verapamil (CALAN-SR) 240 MG CR tablet Take 1 tablet (240 mg total) by mouth daily. 90 tablet 3   Current Facility-Administered Medications on File Prior to Visit  Medication Dose Route Frequency Provider Last Rate Last Admin  . heparin lock flush 100 unit/mL  500 Units Intracatheter Once PRN Earlie Server, MD      . sodium chloride flush (NS) 0.9 % injection 10 mL  10 mL Intracatheter PRN Earlie Server, MD   10 mL  at 10/06/17 1515    BP 126/86   Pulse 71   Temp (!) 96.6 F (35.9 C) (Temporal)   Ht 5\' 4"  (1.626 m)   Wt 183 lb 9 oz (83.3 kg)   SpO2 97%   BMI 31.51 kg/m    Objective:   Physical Exam  Constitutional: She appears well-nourished.  Cardiovascular: Normal rate and regular rhythm.  Respiratory: Effort normal and breath sounds normal.  Musculoskeletal:     Cervical back: Neck supple.  Skin: Skin is warm and dry.  Psychiatric: She has a normal mood and affect.           Assessment & Plan:

## 2019-05-01 ENCOUNTER — Ambulatory Visit (INDEPENDENT_AMBULATORY_CARE_PROVIDER_SITE_OTHER): Payer: Managed Care, Other (non HMO) | Admitting: Primary Care

## 2019-05-01 ENCOUNTER — Encounter: Payer: Self-pay | Admitting: Primary Care

## 2019-05-01 ENCOUNTER — Other Ambulatory Visit: Payer: Self-pay

## 2019-05-01 VITALS — BP 128/70 | HR 89 | Temp 96.0°F | Ht 64.0 in | Wt 185.0 lb

## 2019-05-01 DIAGNOSIS — I739 Peripheral vascular disease, unspecified: Secondary | ICD-10-CM | POA: Insufficient documentation

## 2019-05-01 NOTE — Assessment & Plan Note (Signed)
Also with hyperpigmented color changes to lower extremities. 2+ pedal pulses today. Given her medical history she very well could have lower extremity vascular disease. She is managed on statin and Zetia with LDL under good control.  Referral placed for vascular services for evaluation.

## 2019-05-01 NOTE — Progress Notes (Signed)
Subjective:    Patient ID: Debra Perez, female    DOB: Apr 10, 1955, 64 y.o.   MRN: [id number]  HPI  This visit occurred during the SARS-CoV-2 public health emergency.  Safety protocols were in place, including screening questions prior to the visit, additional usage of staff PPE, and extensive cleaning of exam room while observing appropriate contact time as indicated for disinfecting solutions.   Debra Perez is a 64 year old female with a history of hypertension, paroxysmal atrial fibrillation, CAD, hypothyroidism, type 2 diabetes, osteopenia, lumbar strain, chronic back pain who presents today with a chief complaint of skin discoloration and calf aches.  She visited her dermatologist a few weeks ago for an annual skin check and was told that she may have vascular disease. She's noticed skin discoloration to the anterior lower portion of her lower extremities over the last one year with gradual hyperpigmentation to the area.   She's also noticed "stinging" to the lower extremities and feet and will then see a "vein pop up". She has noticed cramping to her bilateral calves when walking on the treadmill that is improved with rest. She doesn't notice calf pain when walking in her home or at the grocery store.   BP Readings from Last 3 Encounters:  05/01/19 128/70  04/10/19 126/86  01/08/19 (!) 148/90     Review of Systems  Cardiovascular:       Intermittent claudication, varicose veins  Musculoskeletal: Positive for myalgias.  Skin: Positive for color change. Negative for wound.       Past Medical History:  Diagnosis Date  . 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 Diabetes mellitus without complication ([place])   . Diverticulosis   . GERD (gastroesophageal reflux disease)   . History of colonic polyps    benign  . Hyperlipidemia   . Hypertension   . Hypothyroidism   . Lung infection   . Mitral valve prolapse   . NSVT (nonsustained ventricular tachycardia) ([place])   .  Obstructive sleep apnea    on CPAP  . PAF (paroxysmal atrial fibrillation) ([place])    a.  On Eliquis; b. CHADS2VASc => 3 (HTN, DM, female)  . Sleep apnea      Social History   Socioeconomic History  . Marital status: Married    Spouse name: Not on file  . Number of children: Not on file  . Years of education: Not on file  . Highest education level: Not on file  Occupational History  . Occupation: retired  Tobacco Use  . Smoking status: Former Smoker    Packs/day: 1.00    Years: 20.00    Pack years: 20.00    Types: Cigarettes    Quit date: 03/07/2004    Years since quitting: 15.1  . Smokeless tobacco: Never Used  Substance and Sexual Activity  . Alcohol use: No    Alcohol/week: 0.0 standard drinks  . Drug use: No  . Sexual activity: Not on file  Other Topics Concern  . Not on file  Social History Narrative   Married.   1 daughter.   Moved here from [place] due to husbands occupation.   She is a Corporate treasurer.   Enjoys sewing, gardening.   Social Determinants of Health   Financial Resource Strain:   . Difficulty of Paying Living Expenses: Not on file  Food Insecurity:   . Worried About Charity fundraiser in the Last Year: Not on file  . Ran Out of  Food in the Last Year: Not on file  Transportation Needs:   . Lack of Transportation (Medical): Not on file  . Lack of Transportation (Non-Medical): Not on file  Physical Activity:   . Days of Exercise per Week: Not on file  . Minutes of Exercise per Session: Not on file  Stress:   . Feeling of Stress : Not on file  Social Connections:   . Frequency of Communication with Friends and Family: Not on file  . Frequency of Social Gatherings with Friends and Family: Not on file  . Attends Religious Services: Not on file  . Active Member of Clubs or Organizations: Not on file  . Attends Archivist Meetings: Not on file  . Marital Status: Not on file  Intimate Partner Violence:   . Fear of Current or Ex-Partner: Not on file   . Emotionally Abused: Not on file  . Physically Abused: Not on file  . Sexually Abused: Not on file    Past Surgical History:  Procedure Laterality Date  . ABDOMINAL HYSTERECTOMY  2002   ovaries remain  . CLAVICLE SURGERY  2007   right clavicle plate pins  . COLONOSCOPY WITH PROPOFOL N/A 10/17/2017   Procedure: COLONOSCOPY WITH PROPOFOL;  Surgeon: Jonathon Bellows, MD;  Location[facility] ENDOSCOPY;  Service: Gastroenterology;  Laterality: N/A;  . ESOPHAGOGASTRODUODENOSCOPY (EGD) WITH PROPOFOL N/A 10/17/2017   Procedure: ESOPHAGOGASTRODUODENOSCOPY (EGD) WITH PROPOFOL;  Surgeon: Jonathon Bellows, MD;  Location: [facility] ENDOSCOPY;  Service: Gastroenterology;  Laterality: N/A;  . GIVENS CAPSULE STUDY N/A 11/30/2017   Procedure: GIVENS CAPSULE STUDY;  Surgeon: Jonathon Bellows, MD;  Location: [facility] ENDOSCOPY;  Service: Gastroenterology;  Laterality: N/A;  . KNEE ARTHROSCOPY  2012    Family History  Problem Relation Age of Onset  . Depression Mother   . Sudden death Mother 1  . Heart attack Mother   . Arthritis Father   . Heart disease Father   . Hypertension Father   . Emphysema Father   . Bladder Cancer Sister   . Diabetes Sister        Type 2  . Diabetes Brother        type  . Hypertension Brother   . Emphysema Brother   . Heart disease Brother   . Diabetes Maternal Grandfather   . Bladder Cancer Maternal Grandmother   . Breast cancer Paternal Grandmother   . Cancer Paternal Grandfather        colon cancer  . Diabetes Paternal Grandfather     No Known Allergies  Current Outpatient Medications on File Prior to Visit  Medication Sig Dispense Refill  . Accu-Chek FastClix Lancets MISC Use as instructed to test blood sugar 2 times daily 300 each 2  . [name] 50 MG tablet TAKE 1 TABLET BY MOUTH AT BEDTIME 90 tablet 2  . apixaban (ELIQUIS) 5 MG TABS tablet Take 1 tablet (5 mg total) by mouth 2 (two) times daily. 180 tablet 1  . b complex vitamins capsule Take 1 capsule by mouth 2  (two) times daily.    Marland Kitchen ezetimibe (ZETIA) 10 MG tablet TAKE 1 TABLET BY MOUTH DAILY. GENERIC EQUIVALENT FOR ZETIA 90 tablet 3  . famotidine (PEPCID) 20 MG tablet Take 1 tablet (20 mg total) by mouth 2 (two) times daily. For heartburn. 180 tablet 3  . fluticasone (FLONASE) 50 MCG/ACT nasal spray Place 1 spray into both nostrils 2 (two) times daily. 16 g 0  . glipiZIDE (GLUCOTROL XL) 5 MG 24 hr tablet  Take 1 tablet (5 mg total) by mouth daily with breakfast. MUST SCHEDULE OFFICE VISIT BEFORE NEXT REFILL 90 tablet 0  . glucose blood (ACCU-CHEK GUIDE) test strip Use to test blood sugar up to 2 times daily 300 each 2  . Iron-Vitamin C (VITRON-C) 65-125 MG TABS Take by mouth daily.     Marland Kitchen levothyroxine (SYNTHROID) 88 MCG tablet TAKE 1 TABLET BY MOUTH EVERY MORNING WITH WATER ONLY. NO FOOD OR MEDICATIONS FOR 30 MINUTES 90 tablet 1  . Magnesium 500 MG CAPS Take 1 capsule by mouth 2 (two) times daily.    . metoprolol tartrate (LOPRESSOR) 25 MG tablet Take 1 tablet (25 mg total) by mouth every morning. 90 tablet 3  . Multiple Vitamins-Minerals (MULTIVITAMIN ADULT PO) Take 1 capsule by mouth daily.     . propafenone (RYTHMOL SR) 225 MG 12 hr capsule TAKE ONE CAPSULE BY MOUTH TWICE DAILY 180 capsule 3  . rosuvastatin (CRESTOR) 5 MG tablet TAKE 1 TABLET BY MOUTH DAILY GENERIC EQUIVALENT FOR CRESTOR 90 tablet 1  . sitaGLIPtin-metformin (JANUMET) 50-1000 MG tablet Take 1 tablet by mouth 2 (two) times daily with a meal. 180 tablet 1  . venlafaxine XR (EFFEXOR-XR) 37.5 MG 24 hr capsule Take 1 capsule (37.5 mg total) by mouth daily with breakfast. 90 capsule 1  . verapamil (CALAN-SR) 240 MG CR tablet Take 1 tablet (240 mg total) by mouth daily. 90 tablet 3  . hydrochlorothiazide (HYDRODIURIL) 25 MG tablet Take 1 tablet (25 mg total) by mouth daily. 90 tablet 3  . quinapril (ACCUPRIL) 20 MG tablet TAKE 1 TABLET BY MOUTH AT BEDTIME. GENERIC EQUIVALENT FOR ACCUPRIL. 90 tablet 3   Current Facility-Administered  Medications on File Prior to Visit  Medication Dose Route Frequency Provider Last Rate Last Admin  . heparin lock flush 100 unit/mL  500 Units Intracatheter Once PRN Earlie Server, MD      . sodium chloride flush (NS) 0.9 % injection 10 mL  10 mL Intracatheter PRN Earlie Server, MD   10 mL at 10/06/17 1515    BP 128/70   Pulse 89   Temp (!) 96 F (35.6 C) (Temporal)   Ht 5\' 4"  (1.626 m)   Wt 185 lb (83.9 kg)   SpO2 97%   BMI 31.76 kg/m    Objective:   Physical Exam  Constitutional: She appears well-nourished.  Cardiovascular: Normal rate.  Pulses:      Dorsalis pedis pulses are 2+ on the right side and 2+ on the left side.       Posterior tibial pulses are 2+ on the right side and 2+ on the left side.  No calf swelling or tenderness   Respiratory: Effort normal.  Skin: Skin is warm and dry. No erythema.  Hyperpigmented skin color changes to bilateral anterior lower extremities. Right dorsal foot darker than left.            Assessment & Plan:

## 2019-05-01 NOTE — Patient Instructions (Signed)
You will be contacted regarding your referral to vascular surgery.  Please let us know if you have not been contacted within two weeks.   It was a pleasure to see you today!

## 2019-05-13 ENCOUNTER — Ambulatory Visit (INDEPENDENT_AMBULATORY_CARE_PROVIDER_SITE_OTHER): Payer: Managed Care, Other (non HMO) | Admitting: Vascular Surgery

## 2019-05-13 ENCOUNTER — Encounter (INDEPENDENT_AMBULATORY_CARE_PROVIDER_SITE_OTHER): Payer: Self-pay | Admitting: Vascular Surgery

## 2019-05-13 ENCOUNTER — Other Ambulatory Visit: Payer: Self-pay

## 2019-05-13 VITALS — BP 133/78 | HR 66 | Resp 16 | Ht 66.0 in | Wt 184.8 lb

## 2019-05-13 DIAGNOSIS — I83813 Varicose veins of bilateral lower extremities with pain: Secondary | ICD-10-CM

## 2019-05-13 DIAGNOSIS — E782 Mixed hyperlipidemia: Secondary | ICD-10-CM

## 2019-05-13 DIAGNOSIS — I8393 Asymptomatic varicose veins of bilateral lower extremities: Secondary | ICD-10-CM | POA: Insufficient documentation

## 2019-05-13 DIAGNOSIS — I872 Venous insufficiency (chronic) (peripheral): Secondary | ICD-10-CM | POA: Diagnosis not present

## 2019-05-13 DIAGNOSIS — I10 Essential (primary) hypertension: Secondary | ICD-10-CM | POA: Diagnosis not present

## 2019-05-13 DIAGNOSIS — I25119 Atherosclerotic heart disease of native coronary artery with unspecified angina pectoris: Secondary | ICD-10-CM | POA: Diagnosis not present

## 2019-05-13 NOTE — Progress Notes (Signed)
MRN : [id number]  Debra Perez is a 64 y.o. (June 25, 1955) female who presents with chief complaint of  Chief Complaint  Patient presents with  . New Patient (Initial Visit)    ref Carlis Abbott caludication  .  History of Present Illness:   The patient is seen for evaluation of varicose veins. The patient relates episodes of burning and stinging which occur intermittently, things seem worse with standing.  She was seen approximately 2 years ago at one of the vein centers and was asked to wear compression for a minimum of 3 months.  She is continue to wear her compression since that time.  She is also concern regarding the brownish freckling that she has noticed in her shin and calf area.  She also describes a claudication type discomfort in the calves with walking but estimates this occurs when she has been walking up to 1 mile.  There does not appear to be any findings consistent with peripheral arterial disease with claudication-like symptoms.  There is no history of DVT, PE or superficial thrombophlebitis. There is no history of ulceration or hemorrhage. The patient denies a significant family history of varicose veins.  The patient has worn graduated compression in the past. At the present time the patient has not been using over-the-counter analgesics. There is no history of prior surgical intervention or sclerotherapy.    Current Meds  Medication Sig  . Accu-Chek FastClix Lancets MISC Use as instructed to test blood sugar 2 times daily  . [name] 50 MG tablet TAKE 1 TABLET BY MOUTH AT BEDTIME  . apixaban (ELIQUIS) 5 MG TABS tablet Take 1 tablet (5 mg total) by mouth 2 (two) times daily.  Marland Kitchen b complex vitamins capsule Take 1 capsule by mouth 2 (two) times daily.  Marland Kitchen ezetimibe (ZETIA) 10 MG tablet TAKE 1 TABLET BY MOUTH DAILY. GENERIC EQUIVALENT FOR ZETIA  . famotidine (PEPCID) 20 MG tablet Take 1 tablet (20 mg total) by mouth 2 (two) times daily. For heartburn.  .  fluticasone (FLONASE) 50 MCG/ACT nasal spray Place 1 spray into both nostrils 2 (two) times daily.  Marland Kitchen glipiZIDE (GLUCOTROL XL) 5 MG 24 hr tablet Take 1 tablet (5 mg total) by mouth daily with breakfast. MUST SCHEDULE OFFICE VISIT BEFORE NEXT REFILL  . glucose blood (ACCU-CHEK GUIDE) test strip Use to test blood sugar up to 2 times daily  . Iron-Vitamin C (VITRON-C) 65-125 MG TABS Take by mouth daily.   Marland Kitchen levothyroxine (SYNTHROID) 88 MCG tablet TAKE 1 TABLET BY MOUTH EVERY MORNING WITH WATER ONLY. NO FOOD OR MEDICATIONS FOR 30 MINUTES  . Magnesium 500 MG CAPS Take 1 capsule by mouth 2 (two) times daily.  . metoprolol tartrate (LOPRESSOR) 25 MG tablet Take 1 tablet (25 mg total) by mouth every morning.  . Multiple Vitamins-Minerals (MULTIVITAMIN ADULT PO) Take 1 capsule by mouth daily.   . propafenone (RYTHMOL SR) 225 MG 12 hr capsule TAKE ONE CAPSULE BY MOUTH TWICE DAILY  . rosuvastatin (CRESTOR) 5 MG tablet TAKE 1 TABLET BY MOUTH DAILY GENERIC EQUIVALENT FOR CRESTOR  . sitaGLIPtin-metformin (JANUMET) 50-1000 MG tablet Take 1 tablet by mouth 2 (two) times daily with a meal.  . venlafaxine XR (EFFEXOR-XR) 37.5 MG 24 hr capsule Take 1 capsule (37.5 mg total) by mouth daily with breakfast.  . verapamil (CALAN-SR) 240 MG CR tablet Take 1 tablet (240 mg total) by mouth daily.    Past Medical History:  Diagnosis Date  . Carotid artery occlusion  20 % left side  . Diabetes mellitus without complication ([place])   . Diverticulosis   . GERD (gastroesophageal reflux disease)   . History of colonic polyps    benign  . Hyperlipidemia   . Hypertension   . Hypothyroidism   . Lung infection   . Mitral valve prolapse   . NSVT (nonsustained ventricular tachycardia) ([place])   . Obstructive sleep apnea    on CPAP  . PAF (paroxysmal atrial fibrillation) ([place])    a.  On Eliquis; b. CHADS2VASc => 3 (HTN, DM, female)  . Sleep apnea     Past Surgical History:  Procedure Laterality Date  . ABDOMINAL  HYSTERECTOMY  2002   ovaries remain  . CLAVICLE SURGERY  2007   right clavicle plate pins  . COLONOSCOPY WITH PROPOFOL N/A 10/17/2017   Procedure: COLONOSCOPY WITH PROPOFOL;  Surgeon: Jonathon Bellows, MD;  Location: [facility] ENDOSCOPY;  Service: Gastroenterology;  Laterality: N/A;  . ESOPHAGOGASTRODUODENOSCOPY (EGD) WITH PROPOFOL N/A 10/17/2017   Procedure: ESOPHAGOGASTRODUODENOSCOPY (EGD) WITH PROPOFOL;  Surgeon: Jonathon Bellows, MD;  Location: [facility] ENDOSCOPY;  Service: Gastroenterology;  Laterality: N/A;  . GIVENS CAPSULE STUDY N/A 11/30/2017   Procedure: GIVENS CAPSULE STUDY;  Surgeon: Jonathon Bellows, MD;  Location[facility] ENDOSCOPY;  Service: Gastroenterology;  Laterality: N/A;  . KNEE ARTHROSCOPY  2012    Social History Social History   Tobacco Use  . Smoking status: Former Smoker    Packs/day: 1.00    Years: 20.00    Pack years: 20.00    Types: Cigarettes    Quit date: 03/07/2004    Years since quitting: 15.1  . Smokeless tobacco: Never Used  Substance Use Topics  . Alcohol use: No    Alcohol/week: 0.0 standard drinks  . Drug use: No    Family History Family History  Problem Relation Age of Onset  . Depression Mother   . Sudden death Mother 65  . Heart attack Mother   . Arthritis Father   . Heart disease Father   . Hypertension Father   . Emphysema Father   . Bladder Cancer Sister   . Diabetes Sister        Type 2  . Diabetes Brother        type  . Hypertension Brother   . Emphysema Brother   . Heart disease Brother   . Diabetes Maternal Grandfather   . Bladder Cancer Maternal Grandmother   . Breast cancer Paternal Grandmother   . Cancer Paternal Grandfather        colon cancer  . Diabetes Paternal Grandfather   No family history of bleeding/clotting disorders, porphyria or autoimmune disease   No Known Allergies   REVIEW OF SYSTEMS (Negative unless checked)  Constitutional: [] Weight loss  [] Fever  [] Chills Cardiac: [] Chest pain   [] Chest pressure   [] Palpitations    [] Shortness of breath when laying flat   [] Shortness of breath with exertion. Vascular:  [] Pain in legs with walking   [] Pain in legs at rest  [] History of DVT   [] Phlebitis   [x] Swelling in legs   [x] Varicose veins   [] Non-healing ulcers Pulmonary:   [] Uses home oxygen   [] Productive cough   [] Hemoptysis   [] Wheeze  [] COPD   [] Asthma Neurologic:  [] Dizziness   [] Seizures   [] History of stroke   [] History of TIA  [] Aphasia   [] Vissual changes   [] Weakness or numbness in arm   [] Weakness or numbness in leg Musculoskeletal:   [] Joint swelling   [] Joint pain   []   Low back pain Hematologic:  [] Easy bruising  [] Easy bleeding   [] Hypercoagulable state   [] Anemic Gastrointestinal:  [] Diarrhea   [] Vomiting  [] Gastroesophageal reflux/heartburn   [] Difficulty swallowing. Genitourinary:  [] Chronic kidney disease   [] Difficult urination  [] Frequent urination   [] Blood in urine Skin:  [] Rashes   [] Ulcers  Psychological:  [] History of anxiety   []  History of major depression.  Physical Examination  Vitals:   05/13/19 0936  BP: 133/78  Pulse: 66  Resp: 16  Weight: 184 lb 12.8 oz (83.8 kg)  Height: 5\' 6"  (1.676 m)   Body mass index is 29.83 kg/m. Gen: WD/WN, NAD Head: [place]/AT, No temporalis wasting.  Ear/Nose/Throat: Hearing grossly intact, nares w/o erythema or drainage, poor dentition Eyes: PER, EOMI, sclera nonicteric.  Neck: Supple, no masses.  No bruit or JVD.  Pulmonary:  Good air movement, clear to auscultation bilaterally, no use of accessory muscles.  Cardiac: RRR, normal S1, S2, no Murmurs. Vascular: Mild to moderate venous changes of both lower extremities in the ankle and shin area.  There are scattered reticular varicosities noted as well. Vessel Right Left  PT Palpable Palpable  DP Palpable Palpable  Gastrointestinal: soft, non-distended. No guarding/no peritoneal signs.  Musculoskeletal: M/S 5/5 throughout.  No deformity or atrophy.  Neurologic: CN 2-12 intact. Pain and light touch  intact in extremities.  Symmetrical.  Speech is fluent. Motor exam as listed above. Psychiatric: Judgment intact, Mood & affect appropriate for pt's clinical situation. Dermatologic: Mild venousrashes no ulcers noted.  No changes consistent with cellulitis. skin changes of chronic lymphedema.  CBC Lab Results  Component Value Date   WBC 9.0 08/06/2018   HGB 13.9 08/06/2018   HCT 41.7 08/06/2018   MCV 93.7 08/06/2018   PLT 276.0 08/06/2018    BMET    Component Value Date/Time   NA 140 08/06/2018 1449   NA 142 03/15/2018 0911   K 4.3 08/06/2018 1449   CL 102 08/06/2018 1449   CO2 30 08/06/2018 1449   GLUCOSE 169 (H) 08/06/2018 1449   BUN 12 08/06/2018 1449   BUN 12 03/15/2018 0911   CREATININE 0.81 08/06/2018 1449   CALCIUM 8.8 08/06/2018 1449   GFRNONAA 96 03/15/2018 0911   GFRAA 111 03/15/2018 0911   CrCl cannot be calculated (Patient's most recent lab result is older than the maximum 21 days allowed.).  COAG No results found for: INR, PROTIME  Radiology No results found.  Assessment/Plan 1. Chronic venous insufficiency No surgery or intervention at this point in time.    I have had a long discussion with the patient regarding venous insufficiency and why it  causes symptoms. I have discussed with the patient the chronic skin changes that accompany venous insufficiency and the long term sequela such as infection and ulceration.  Patient will continue wearing graduated compression stockings class 1 (20-30 mmHg) or compression wraps on a daily basis a prescription was given. The patient will put the stockings on first thing in the morning and removing them in the evening. The patient is instructed specifically not to sleep in the stockings.    In addition, behavioral modification including several periods of elevation of the lower extremities during the day will be continued. I have demonstrated that proper elevation is a position with the ankles at heart level.  The  patient is instructed to continue routine exercise, especially walking on a daily basis   2. Varicose veins of both lower extremities with pain Recommend:  The patient is complaining  of varicose veins.    I have had a long discussion with the patient regarding  varicose veins and why they cause symptoms.  Patient will begin wearing graduated compression stockings on a daily basis, beginning first thing in the morning and removing them in the evening. The patient is instructed specifically not to sleep in the stockings.    The patient  will also begin using over-the-counter analgesics such as Motrin 600 mg po TID to help control the symptoms as needed.    In addition, behavioral modification including elevation during the day will be initiated, utilizing a recliner was recommended.  The patient is also instructed to continue exercising such as walking 4-5 times per week.  At this time the patient wishes to continue conservative therapy and is not interested in more invasive treatments such as laser ablation and sclerotherapy.  The Patient will follow up PRN if the symptoms worsen.  3. Coronary artery disease involving native coronary artery of native heart with angina pectoris ([place]) Continue cardiac and antihypertensive medications as already ordered and reviewed, no changes at this time.  Continue statin as ordered and reviewed, no changes at this time  Nitrates PRN for chest pain   4. Essential hypertension Continue antihypertensive medications as already ordered, these medications have been reviewed and there are no changes at this time.   5. Mixed hyperlipidemia Continue statin as ordered and reviewed, no changes at this time     Hortencia Pilar, MD  05/13/2019 10:03 AM

## 2019-05-25 ENCOUNTER — Other Ambulatory Visit: Payer: Self-pay | Admitting: Primary Care

## 2019-05-25 DIAGNOSIS — E1159 Type 2 diabetes mellitus with other circulatory complications: Secondary | ICD-10-CM

## 2019-06-13 ENCOUNTER — Other Ambulatory Visit: Payer: Self-pay | Admitting: Pharmacist

## 2019-06-13 MED ORDER — APIXABAN 5 MG PO TABS: 5.0000 mg | ORAL_TABLET | Freq: Two times a day (BID) | ORAL | 1 refills | Status: DC

## 2019-06-13 NOTE — Progress Notes (Signed)
Age 64, weight 84kg, SCr 0.81 on 08/06/18, afib, last OV Oct 2020

## 2019-06-22 NOTE — Progress Notes (Signed)
Patient ID: Debra Perez, female   DOB: 1955-04-19, 64 y.o.   MRN: [id number] Cardiology Office Note  Date:  06/24/2019   ID:  Debra Perez, DOB 02-18-1956, MRN [id number]  PCP:  Pleas [name], NP   Chief Complaint  Patient presents with  . office visit    Pt having no complaints. Meds verbally reviewed w/ pt.    HPI:  [name] is a pleasant 64 year old woman with history of  Diabetes II Paroxysmal atrial fibrillation, ChadsVASC 4 to 5 nonsustained VT/palpitations,  normal ejection fraction with EF greater than 60%,  mild three-vessel coronary artery disease on CTA scan,  20 years of smoking who stopped 13 years ago,  Upper respiratory infection February 2016  atrial fibrillation after inhaling albuterol while on prednisone,  evaluated in the hospital at that time with arrhythmia lasting for hours before converting back to normal sinus rhythm,  History of Obstructive sleep apnea, on CPAP Who presents for follow-up of her atrial fibrillation  Doing well No regular exercise program but does lots of work in the garden Nonsmoker HAB1C 7.2, up from 7.0 Weight is higher  Lab work reviewed Total chol 128, LDL 52  Taking more of the HCTZ, feels better Previously doing rarely, now almost every other day sometimes every day No BMP since June last year  Previously tried Journalist, newspaper, had fatigue Tolerating metoprolol tartrate 20 5 in the morning Tolerating mild fatigue  Tolerating Crestor, cholesterol a goal  Lab work reviewed with her Hemoglobin A1c 7, one year earlier 6.1 Total cholesterol 128 LDL 52 Normal renal function  EKG personally reviewed by myself on todays visit showsNo sinus rhythm rate 61 bpm nonspecific ST abnormality right bundle branch block  Other past medical history reviewed CT scan done in 2016 again reviewed with him showing mild coronary artery disease calcification   atrial fibrillation 01/16/2015 lasting at least 5 hours Possibly also 2  other very short episodes mention on her last clinic visit  metoprolol held for fatigue  multaq held for hair loss  started Rythmol 225 mg twice a day, SR dose, and on this regiment has felt better   Cardiac CTA with mild three-vessel CAD, ectatic ascending aorta measuring 3.72 cm  PFTs with mild obstructive lung disease   PMH:   has a past medical history of Carotid artery occlusion, Diabetes mellitus without complication ([place]), Diverticulosis, GERD (gastroesophageal reflux disease), History of colonic polyps, Hyperlipidemia, Hypertension, Hypothyroidism, Lung infection, Mitral valve prolapse, NSVT (nonsustained ventricular tachycardia) ([place]), Obstructive sleep apnea, PAF (paroxysmal atrial fibrillation) ([place]), and Sleep apnea.  PSH:    Past Surgical History:  Procedure Laterality Date  . ABDOMINAL HYSTERECTOMY  2002   ovaries remain  . CLAVICLE SURGERY  2007   right clavicle plate pins  . COLONOSCOPY WITH PROPOFOL N/A 10/17/2017   Procedure: COLONOSCOPY WITH PROPOFOL;  Surgeon: Jonathon Bellows, MD;  Location[facility] ENDOSCOPY;  Service: Gastroenterology;  Laterality: N/A;  . ESOPHAGOGASTRODUODENOSCOPY (EGD) WITH PROPOFOL N/A 10/17/2017   Procedure: ESOPHAGOGASTRODUODENOSCOPY (EGD) WITH PROPOFOL;  Surgeon: Jonathon Bellows, MD;  Location: [facility] ENDOSCOPY;  Service: Gastroenterology;  Laterality: N/A;  . GIVENS CAPSULE STUDY N/A 11/30/2017   Procedure: GIVENS CAPSULE STUDY;  Surgeon: Jonathon Bellows, MD;  Location[facility] ENDOSCOPY;  Service: Gastroenterology;  Laterality: N/A;  . KNEE ARTHROSCOPY  2012    Current Outpatient Medications  Medication Sig Dispense Refill  . Accu-Chek FastClix Lancets MISC Use as instructed to test blood sugar 2 times daily 300 each 2  . [name] 50  MG tablet TAKE 1 TABLET BY MOUTH AT BEDTIME 90 tablet 2  . apixaban (ELIQUIS) 5 MG TABS tablet Take 1 tablet (5 mg total) by mouth 2 (two) times daily. 180 tablet 1  . b complex vitamins capsule Take 1 capsule by  mouth 2 (two) times daily.    Marland Kitchen ezetimibe (ZETIA) 10 MG tablet TAKE 1 TABLET BY MOUTH DAILY. GENERIC EQUIVALENT FOR ZETIA 90 tablet 3  . famotidine (PEPCID) 20 MG tablet Take 1 tablet (20 mg total) by mouth 2 (two) times daily. For heartburn. 180 tablet 3  . fluticasone (FLONASE) 50 MCG/ACT nasal spray Place 1 spray into both nostrils 2 (two) times daily. 16 g 0  . glipiZIDE (GLUCOTROL XL) 5 MG 24 hr tablet Take 1qd 90 tablet 2  . glucose blood (ACCU-CHEK GUIDE) test strip Use to test blood sugar up to 2 times daily 300 each 2  . hydrochlorothiazide (HYDRODIURIL) 25 MG tablet Take 1 tablet (25 mg total) by mouth daily. 90 tablet 3  . Iron-Vitamin C (VITRON-C) 65-125 MG TABS Take by mouth daily.     Marland Kitchen levothyroxine (SYNTHROID) 88 MCG tablet TAKE 1 TABLET BY MOUTH EVERY MORNING WITH WATER ONLY. NO FOOD OR MEDICATIONS FOR 30 MINUTES 90 tablet 1  . Magnesium 500 MG CAPS Take 1 capsule by mouth 2 (two) times daily.    . metoprolol tartrate (LOPRESSOR) 25 MG tablet Take 1 tablet (25 mg total) by mouth every morning. 90 tablet 3  . Multiple Vitamins-Minerals (MULTIVITAMIN ADULT PO) Take 1 capsule by mouth daily.     . propafenone (RYTHMOL SR) 225 MG 12 hr capsule TAKE ONE CAPSULE BY MOUTH TWICE DAILY 180 capsule 3  . quinapril (ACCUPRIL) 20 MG tablet TAKE 1 TABLET BY MOUTH AT BEDTIME. GENERIC EQUIVALENT FOR ACCUPRIL. 90 tablet 3  . rosuvastatin (CRESTOR) 5 MG tablet TAKE 1 TABLET BY MOUTH DAILY GENERIC EQUIVALENT FOR CRESTOR 90 tablet 1  . sitaGLIPtin-metformin (JANUMET) 50-1000 MG tablet Take 1 tablet by mouth 2 (two) times daily with a meal. 180 tablet 1  . venlafaxine XR (EFFEXOR-XR) 37.5 MG 24 hr capsule Take 1 capsule (37.5 mg total) by mouth daily with breakfast. 90 capsule 1  . verapamil (CALAN-SR) 240 MG CR tablet Take 1 tablet (240 mg total) by mouth daily. 90 tablet 3   No current facility-administered medications for this visit.   Facility-Administered Medications Ordered in Other Visits   Medication Dose Route Frequency Provider Last Rate Last Admin  . heparin lock flush 100 unit/mL  500 Units Intracatheter Once PRN Earlie Server, MD      . sodium chloride flush (NS) 0.9 % injection 10 mL  10 mL Intracatheter PRN Earlie Server, MD   10 mL at 10/06/17 1515     Allergies:   Patient has no known allergies.   Social History:  The patient  reports that she quit smoking about 15 years ago. Her smoking use included cigarettes. She has a 20.00 pack-year smoking history. She has never used smokeless tobacco. She reports that she does not drink alcohol or use drugs.   Family History:   family history includes Arthritis in her father; Bladder Cancer in her maternal grandmother and sister; Breast cancer in her paternal grandmother; Cancer in her paternal grandfather; Depression in her mother; Diabetes in her brother, maternal grandfather, paternal grandfather, and sister; Emphysema in her brother and father; Heart attack in her mother; Heart disease in her brother and father; Hypertension in her brother and father; Sudden death (  age of onset: 25) in her mother.    Review of Systems: Review of Systems  Constitutional: Negative.   Respiratory: Negative.   Cardiovascular: Positive for palpitations.  Gastrointestinal: Negative.   Musculoskeletal: Negative.   Neurological: Negative.   Psychiatric/Behavioral: Negative.   All other systems reviewed and are negative.    PHYSICAL EXAM: VS:  BP 120/80 (BP Location: Left Arm, Patient Position: Sitting, Cuff Size: Normal)   Pulse 61   Ht 5\' 6"  (1.676 m)   Wt 182 lb 2 oz (82.6 kg)   SpO2 95%   BMI 29.40 kg/m  , BMI Body mass index is 29.4 kg/m. Constitutional:  oriented to person, place, and time. No distress.  HENT:  Head: Grossly normal Eyes:  no discharge. No scleral icterus.  Neck: No JVD, no carotid bruits  Cardiovascular: Regular rate and rhythm, no murmurs appreciated Pulmonary/Chest: Clear to auscultation bilaterally, no wheezes or  rails Abdominal: Soft.  no distension.  no tenderness.  Musculoskeletal: Normal range of motion Neurological:  normal muscle tone. Coordination normal. No atrophy Skin: Skin warm and dry Psychiatric: normal affect, pleasant  Recent Labs: 08/06/2018: ALT 17; BUN 12; Creatinine, Ser 0.81; Hemoglobin 13.9; Platelets 276.0; Potassium 4.3; Sodium 140    Lipid Panel Lab Results  Component Value Date   CHOL 128 09/24/2018   HDL 48.60 09/24/2018   LDLCALC 52 09/24/2018   TRIG 136.0 09/24/2018      Wt Readings from Last 3 Encounters:  06/24/19 182 lb 2 oz (82.6 kg)  05/13/19 184 lb 12.8 oz (83.8 kg)  05/01/19 185 lb (83.9 kg)     ASSESSMENT AND PLAN:   Paroxysmal atrial fibrillation (HCC) - Plan: EKG 12-Lead  tolerating Rythmol and Eliquis, metoprol, verapamil  Essential hypertension -  Blood pressure is well controlled on today's visit. No changes made to the medications. BMP today  NSVT (nonsustained ventricular tachycardia) (HCC) denies any symptoms concerning for nonsustained VT Continue beta-blocker, verapamil  Hyperlipidemia Coronary calcifications seen on previous CT scan chest Cholesterol is at goal on the current lipid regimen. No changes to the medications were made.  Type 2 diabetes mellitus with other circulatory complication, without long-term current use of insulin ([place]) We have encouraged continued exercise, careful diet management in an effort to lose weight.   Disposition:   F/U  12 months   Total encounter time more than 25 minutes  Greater than 50% was spent in counseling and coordination of care with the patient     Orders Placed This Encounter  Procedures  . EKG 12-Lead     Signed, Esmond Plants, M.D., Ph.D. 06/24/2019  [place], [place]

## 2019-06-24 ENCOUNTER — Ambulatory Visit (INDEPENDENT_AMBULATORY_CARE_PROVIDER_SITE_OTHER): Payer: Managed Care, Other (non HMO) | Admitting: Cardiovascular Disease

## 2019-06-24 ENCOUNTER — Other Ambulatory Visit: Payer: Self-pay

## 2019-06-24 ENCOUNTER — Encounter: Payer: Self-pay | Admitting: Cardiovascular Disease

## 2019-06-24 VITALS — BP 120/80 | HR 61 | Ht 66.0 in | Wt 182.1 lb

## 2019-06-24 DIAGNOSIS — I480 Paroxysmal atrial fibrillation: Secondary | ICD-10-CM

## 2019-06-24 DIAGNOSIS — E782 Mixed hyperlipidemia: Secondary | ICD-10-CM

## 2019-06-24 DIAGNOSIS — I471 Supraventricular tachycardia: Secondary | ICD-10-CM | POA: Diagnosis not present

## 2019-06-24 DIAGNOSIS — I700 Atherosclerosis of aorta: Secondary | ICD-10-CM | POA: Diagnosis not present

## 2019-06-24 DIAGNOSIS — I10 Essential (primary) hypertension: Secondary | ICD-10-CM

## 2019-06-24 DIAGNOSIS — I2510 Atherosclerotic heart disease of native coronary artery without angina pectoris: Secondary | ICD-10-CM | POA: Diagnosis not present

## 2019-06-24 DIAGNOSIS — I77819 Aortic ectasia, unspecified site: Secondary | ICD-10-CM

## 2019-06-24 MED ORDER — METOPROLOL TARTRATE 25 MG PO TABS: 25.0000 mg | ORAL_TABLET | Freq: Two times a day (BID) | ORAL | 3 refills | Status: DC

## 2019-06-24 NOTE — Patient Instructions (Addendum)
Medication Instructions:  No changes  If you need a refill on your cardiac medications before your next appointment, please call your pharmacy.    Lab work: BMP to be done today   If you have labs (blood work) drawn today and your tests are completely normal, you will receive your results only by: Marland Kitchen MyChart Message (if you have MyChart) OR . A paper copy in the mail If you have any lab test that is abnormal or we need to change your treatment, we will call you to review the results.   Testing/Procedures: No new testing needed   Follow-Up: At [facility], you and your health needs are our priority.  As part of our continuing mission to provide you with exceptional heart care, we have created designated Provider Care Teams.  These Care Teams include your primary Cardiologist (physician) and Advanced Practice Providers (APPs -  Physician Assistants and Nurse Practitioners) who all work together to provide you with the care you need, when you need it.  . You will need a follow up appointment in 12 months   . Providers on your designated Care Team:   . Murray Hodgkins, NP . Christell Faith, PA-C . Marrianne Mood, PA-C  Any Other Special Instructions Will Be Listed Below (If Applicable).  For educational health videos Log in to : [email or web address] Or : [email or web address], password : [email or web address]

## 2019-06-25 LAB — BASIC METABOLIC PANEL
BUN/Creatinine Ratio: 14 (ref 12–28)
BUN: 11 mg/dL (ref 8–27)
CO2: 23 mmol/L (ref 20–29)
Calcium: 9.8 mg/dL (ref 8.7–10.3)
Chloride: 100 mmol/L (ref 96–106)
Creatinine, Ser: 0.77 mg/dL (ref 0.57–1.00)
GFR calc Af Amer: 96 mL/min/{1.73_m2} (ref >59)
GFR calc non Af Amer: 84 mL/min/{1.73_m2} (ref >59)
Glucose: 155 mg/dL — ABNORMAL HIGH (ref 65–99)
Potassium: 4.5 mmol/L (ref 3.5–5.2)
Sodium: 138 mmol/L (ref 134–144)

## 2019-07-09 ENCOUNTER — Other Ambulatory Visit: Payer: Self-pay

## 2019-07-09 ENCOUNTER — Other Ambulatory Visit: Payer: Self-pay | Admitting: Primary Care

## 2019-07-09 ENCOUNTER — Encounter: Payer: Self-pay | Admitting: Primary Care

## 2019-07-09 ENCOUNTER — Ambulatory Visit (INDEPENDENT_AMBULATORY_CARE_PROVIDER_SITE_OTHER): Payer: Managed Care, Other (non HMO) | Admitting: Primary Care

## 2019-07-09 DIAGNOSIS — E1159 Type 2 diabetes mellitus with other circulatory complications: Secondary | ICD-10-CM | POA: Diagnosis not present

## 2019-07-09 MED ORDER — METFORMIN HCL ER 500 MG PO TB24: 1000.0000 mg | ORAL_TABLET | Freq: Every day | ORAL | 0 refills | Status: DC

## 2019-07-09 MED ORDER — ROSUVASTATIN CALCIUM 5 MG PO TABS: ORAL_TABLET | ORAL | 3 refills | Status: DC

## 2019-07-09 NOTE — Patient Instructions (Signed)
Stop sitagliptin-metformin for diabetes.  Start metformin XR 500 mg for diabetes. Take 2 tablets daily in the morning with breakfast.  Please update me regarding your symptoms and blood sugars in a few weeks.  It was a pleasure to see you today!

## 2019-07-09 NOTE — Assessment & Plan Note (Signed)
Unable to tolerate side effects of plain metformin. Will discontinue sitagliptin-metformin. Start metformin XR 1000 mg daily. She will update in a few weeks.  Consider the need to add back in sitaglipin separately.

## 2019-07-09 NOTE — Progress Notes (Signed)
Subjective:    Patient ID: Debra Perez, female    DOB: 05-05-1955, 64 y.o.   MRN: [id number]  HPI  This visit occurred during the SARS-CoV-2 public health emergency.  Safety protocols were in place, including screening questions prior to the visit, additional usage of staff PPE, and extensive cleaning of exam room while observing appropriate contact time as indicated for disinfecting solutions.   Debra Perez is a 64 year old female with a history of hypertension, paroxysmal atrial fibrillation, CAD, mitral valve disorder, type 2 diabetes, hypothyroidism, hyperlipidemia, chest pain, anemia who presents today to discuss diabetes medications.  She had to stop taking sitagliptan-metformin 50-1000 mg BID due to symptoms of diarrhea with urgency and some incontinence. She's been taking sitagliptin-metformin for years and had intermittent diarrhea that was controllable, but over the last 2-3 months she's been unable to tolerate the GI side effects.   She stopped her sitagliptin-metformin 2 weeks ago and has had a resolve in her symptoms. She's checking her glucose readings in the morning fasting which is running 180's-190's. During the say her glucose is running 90 to 120's.   She has never tried extended release metformin.  BP Readings from Last 3 Encounters:  07/09/19 122/76  06/24/19 120/80  05/13/19 133/78     Review of Systems  Cardiovascular: Negative for chest pain and palpitations.  Gastrointestinal: Positive for diarrhea. Negative for blood in stool.  Neurological: Negative for dizziness.       Past Medical History:  Diagnosis Date  . 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 Diabetes mellitus without complication ([place])   . Diverticulosis   . GERD (gastroesophageal reflux disease)   . History of colonic polyps    benign  . Hyperlipidemia   . Hypertension   . Hypothyroidism   . Lung infection   . Mitral valve prolapse   . NSVT (nonsustained ventricular  tachycardia) ([place])   . Obstructive sleep apnea    on CPAP  . PAF (paroxysmal atrial fibrillation) ([place])    a.  On Eliquis; b. CHADS2VASc => 3 (HTN, DM, female)  . Sleep apnea      Social History   Socioeconomic History  . Marital status: Married    Spouse name: Not on file  . Number of children: Not on file  . Years of education: Not on file  . Highest education level: Not on file  Occupational History  . Occupation: retired  Tobacco Use  . Smoking status: Former Smoker    Packs/day: 1.00    Years: 20.00    Pack years: 20.00    Types: Cigarettes    Quit date: 03/07/2004    Years since quitting: 15.3  . Smokeless tobacco: Never Used  Substance and Sexual Activity  . Alcohol use: No    Alcohol/week: 0.0 standard drinks  . Drug use: No  . Sexual activity: Not on file  Other Topics Concern  . Not on file  Social History Narrative   Married.   1 daughter.   Moved here from [place] due to husbands occupation.   She is a Corporate treasurer.   Enjoys sewing, gardening.   Social Determinants of Health   Financial Resource Strain:   . Difficulty of Paying Living Expenses:   Food Insecurity:   . Worried About Charity fundraiser in the Last Year:   . Arboriculturist in the Last Year:   Transportation Needs:   . Film/video editor (Medical):   Marland Kitchen  Lack of Transportation (Non-Medical):   Physical Activity:   . Days of Exercise per Week:   . Minutes of Exercise per Session:   Stress:   . Feeling of Stress :   Social Connections:   . Frequency of Communication with Friends and Family:   . Frequency of Social Gatherings with Friends and Family:   . Attends Religious Services:   . Active Member of Clubs or Organizations:   . Attends Archivist Meetings:   Marland Kitchen Marital Status:   Intimate Partner Violence:   . Fear of Current or Ex-Partner:   . Emotionally Abused:   Marland Kitchen Physically Abused:   . Sexually Abused:     Past Surgical History:  Procedure Laterality Date  .  ABDOMINAL HYSTERECTOMY  2002   ovaries remain  . CLAVICLE SURGERY  2007   right clavicle plate pins  . COLONOSCOPY WITH PROPOFOL N/A 10/17/2017   Procedure: COLONOSCOPY WITH PROPOFOL;  Surgeon: Jonathon Bellows, MD;  Location: [facility] ENDOSCOPY;  Service: Gastroenterology;  Laterality: N/A;  . ESOPHAGOGASTRODUODENOSCOPY (EGD) WITH PROPOFOL N/A 10/17/2017   Procedure: ESOPHAGOGASTRODUODENOSCOPY (EGD) WITH PROPOFOL;  Surgeon: Jonathon Bellows, MD;  Location: [facility] ENDOSCOPY;  Service: Gastroenterology;  Laterality: N/A;  . GIVENS CAPSULE STUDY N/A 11/30/2017   Procedure: GIVENS CAPSULE STUDY;  Surgeon: Jonathon Bellows, MD;  Location: [facility] ENDOSCOPY;  Service: Gastroenterology;  Laterality: N/A;  . KNEE ARTHROSCOPY  2012    Family History  Problem Relation Age of Onset  . Depression Mother   . Sudden death Mother 55  . Heart attack Mother   . Arthritis Father   . Heart disease Father   . Hypertension Father   . Emphysema Father   . Bladder Cancer Sister   . Diabetes Sister        Type 2  . Diabetes Brother        type  . Hypertension Brother   . Emphysema Brother   . Heart disease Brother   . Diabetes Maternal Grandfather   . Bladder Cancer Maternal Grandmother   . Breast cancer Paternal Grandmother   . Cancer Paternal Grandfather        colon cancer  . Diabetes Paternal Grandfather     No Known Allergies  Current Outpatient Medications on File Prior to Visit  Medication Sig Dispense Refill  . Accu-Chek FastClix Lancets MISC Use as instructed to test blood sugar 2 times daily 300 each 2  . [name] 50 MG tablet TAKE 1 TABLET BY MOUTH AT BEDTIME 90 tablet 2  . apixaban (ELIQUIS) 5 MG TABS tablet Take 1 tablet (5 mg total) by mouth 2 (two) times daily. 180 tablet 1  . b complex vitamins capsule Take 1 capsule by mouth 2 (two) times daily.    Marland Kitchen ezetimibe (ZETIA) 10 MG tablet TAKE 1 TABLET BY MOUTH DAILY. GENERIC EQUIVALENT FOR ZETIA 90 tablet 3  . famotidine (PEPCID) 20 MG tablet  Take 1 tablet (20 mg total) by mouth 2 (two) times daily. For heartburn. 180 tablet 3  . fluticasone (FLONASE) 50 MCG/ACT nasal spray Place 1 spray into both nostrils 2 (two) times daily. 16 g 0  . glipiZIDE (GLUCOTROL XL) 5 MG 24 hr tablet Take 1qd 90 tablet 2  . glucose blood (ACCU-CHEK GUIDE) test strip Use to test blood sugar up to 2 times daily 300 each 2  . Iron-Vitamin C (VITRON-C) 65-125 MG TABS Take by mouth daily.     Marland Kitchen levothyroxine (SYNTHROID) 88 MCG tablet TAKE 1 TABLET  BY MOUTH EVERY MORNING WITH WATER ONLY. NO FOOD OR MEDICATIONS FOR 30 MINUTES 90 tablet 1  . Magnesium 500 MG CAPS Take 1 capsule by mouth 2 (two) times daily.    . metoprolol tartrate (LOPRESSOR) 25 MG tablet Take 1 tablet (25 mg total) by mouth 2 (two) times daily. 180 tablet 3  . Multiple Vitamins-Minerals (MULTIVITAMIN ADULT PO) Take 1 capsule by mouth daily.     . propafenone (RYTHMOL SR) 225 MG 12 hr capsule TAKE ONE CAPSULE BY MOUTH TWICE DAILY 180 capsule 3  . rosuvastatin (CRESTOR) 5 MG tablet TAKE 1 TABLET BY MOUTH DAILY GENERIC EQUIVALENT FOR CRESTOR 90 tablet 1  . venlafaxine XR (EFFEXOR-XR) 37.5 MG 24 hr capsule Take 1 capsule (37.5 mg total) by mouth daily with breakfast. 90 capsule 1  . verapamil (CALAN-SR) 240 MG CR tablet Take 1 tablet (240 mg total) by mouth daily. 90 tablet 3  . hydrochlorothiazide (HYDRODIURIL) 25 MG tablet Take 1 tablet (25 mg total) by mouth daily. 90 tablet 3  . quinapril (ACCUPRIL) 20 MG tablet TAKE 1 TABLET BY MOUTH AT BEDTIME. GENERIC EQUIVALENT FOR ACCUPRIL. 90 tablet 3   Current Facility-Administered Medications on File Prior to Visit  Medication Dose Route Frequency Provider Last Rate Last Admin  . heparin lock flush 100 unit/mL  500 Units Intracatheter Once PRN Earlie Server, MD      . sodium chloride flush (NS) 0.9 % injection 10 mL  10 mL Intracatheter PRN Earlie Server, MD   10 mL at 10/06/17 1515    BP 122/76   Pulse (!) 58   Temp (!) 96 F (35.6 C) (Temporal)   Ht 5\' 6"   (1.676 m)   Wt 179 lb 8 oz (81.4 kg)   SpO2 95%   BMI 28.97 kg/m    Objective:   Physical Exam  Constitutional: She appears well-nourished.  Cardiovascular: Normal rate and regular rhythm.  Respiratory: Effort normal and breath sounds normal.  Musculoskeletal:     Cervical back: Neck supple.  Skin: Skin is warm and dry.           Assessment & Plan:

## 2019-07-15 ENCOUNTER — Other Ambulatory Visit: Payer: Self-pay | Admitting: Cardiovascular Disease

## 2019-07-15 MED ORDER — PROPAFENONE HCL ER 225 MG PO CP12: 225.0000 mg | ORAL_CAPSULE | Freq: Two times a day (BID) | ORAL | 3 refills | Status: DC

## 2019-07-15 NOTE — Telephone Encounter (Signed)
*  STAT* If patient is at the pharmacy, call can be transferred to refill team.   1. Which medications need to be refilled? (please list name of each medication and dose if known) propafenone 225 mg bid  2. Which pharmacy/location (including street and city if local pharmacy) is medication to be sent to? [facility] (mail in)  3. Do they need a 30 day or 90 day supply? [place]

## 2019-07-15 NOTE — Telephone Encounter (Signed)
Requested Prescriptions   Signed Prescriptions Disp Refills  . propafenone (RYTHMOL SR) 225 MG 12 hr capsule 180 capsule 3    Sig: Take 1 capsule (225 mg total) by mouth 2 (two) times daily.    Authorizing Provider: Minna Merritts    Ordering User: Britt Bottom

## 2019-07-21 DIAGNOSIS — E1159 Type 2 diabetes mellitus with other circulatory complications: Secondary | ICD-10-CM

## 2019-07-22 MED ORDER — METFORMIN HCL ER 500 MG PO TB24: 1000.0000 mg | ORAL_TABLET | Freq: Every day | ORAL | 0 refills | Status: DC

## 2019-07-22 MED ORDER — METFORMIN HCL ER 500 MG PO TB24: 1000.0000 mg | ORAL_TABLET | Freq: Every day | ORAL | 3 refills | Status: DC

## 2019-07-31 ENCOUNTER — Ambulatory Visit (INDEPENDENT_AMBULATORY_CARE_PROVIDER_SITE_OTHER): Payer: Managed Care, Other (non HMO) | Admitting: Dermatology

## 2019-07-31 ENCOUNTER — Other Ambulatory Visit: Payer: Self-pay

## 2019-07-31 DIAGNOSIS — I8393 Asymptomatic varicose veins of bilateral lower extremities: Secondary | ICD-10-CM | POA: Diagnosis not present

## 2019-07-31 NOTE — Patient Instructions (Signed)
BEFORE YOUR APPOINTMENT FOR SCLEROTHERAPY  1. When you telephone for your appointment for the sclerotherapy procedure, please let the receptionist know that you are scheduling for the fifteen (15) minute sclerotherapy procedure not just a regular visit.  2. On the day of the procedure, please cleanse and dry the areas, but do not use any moisturizers or other products on the area(s) to be treated.  3. Bring a pair of comfortable shorts to wear during the procedure.  4. Be sure to bring your recommended graduated compression stockings with you to the office. You will be wearing them home when your visit is over. These compression hose can be purchased at most medical supply stores.  After Your Sclerotherapy Procedure  1. Please wear the graduated compression stockings for 24 hours immediately following the completion of the sclerotherapy procedure.  2. We recommend that you avoid vigorous activity as much as possible for the first twenty-four (24) hours. You can do your "normal" routine, but avoid an above normal amount of time on your feet. Elevating the legs when sitting and avoidance of vigorous leg movements or exercise in the first few days after treatment may improve your results.  3. You may remove the compression dressings (cotton balls) and tape the next morning.  4. Please continue wearing the compression stockings during waking hours for the two (2) weeks following sclerotherapy.  5. If you have any blisters, sores or ulcers or other problems following your procedure please call or return to the office immediately.     THE PROCEDURE FEE IS $350.00 PER FIFTEEN (15) MINUTE SESSION. WE REQUIRE THAT THIS PROCEDURE BE PAID FOR IN FULL ON OR BEFORE THE DATE THAT IT IS PERFORMED. WE WILL GIVE YOU A RECEIPT THAT YOU CAN FILE WITH YOUR INSURANCE COMPANY. WE GENERALLY DO NOT FILE THIS PROCEDURE WITH ANY INSURANCE COMPANY EXCEPT UNDER CERTAIN CIRCUMSTANCES WHERE PRIOR AUTHORIZATION HAS BEEN  CONFIRMED. THIS PROCEDURE IS GENERALLY CONSIDERED TO BE A COSMETIC PROCEDURE BY INSURANCE COMPANIES. 

## 2019-07-31 NOTE — Progress Notes (Signed)
   New Patient Visit  Subjective  Debra Perez is a 64 y.o. female who presents for the following: spider veins (bil lower legs, pt would like to discuss treatment).  The following portions of the chart were reviewed this encounter and updated as appropriate:  Tobacco  Allergies  Meds  Problems  Med Hx  Surg Hx  Fam Hx      Review of Systems:  No other skin or systemic complaints except as noted in HPI or Assessment and Plan.  Objective  Well appearing patient in no apparent distress; mood and affect are within normal limits.  A focused examination was performed including bil lower legs. Relevant physical exam findings are noted in the Assessment and Plan.  Objective  bil lower legs: Telangiectasias lower legs   Assessment & Plan  Asymptomatic varicose veins of both lower extremities bil lower legs  Discussed Sclerotherapy with [facility] $350 per treatment session, may require multiple treatments for best results.  Return for to be scheduled for Sclerotherapy.   I, Othelia Pulling, RMA, am acting as scribe for Sarina Ser, MD .  Documentation: I have reviewed the above documentation for accuracy and completeness, and I agree with the above.  Sarina Ser, MD

## 2019-08-01 ENCOUNTER — Other Ambulatory Visit: Payer: Self-pay | Admitting: Primary Care

## 2019-08-01 DIAGNOSIS — K219 Gastro-esophageal reflux disease without esophagitis: Secondary | ICD-10-CM

## 2019-08-02 ENCOUNTER — Encounter: Payer: Self-pay | Admitting: Dermatology

## 2019-08-09 ENCOUNTER — Other Ambulatory Visit: Payer: Self-pay | Admitting: Primary Care

## 2019-08-09 DIAGNOSIS — E039 Hypothyroidism, unspecified: Secondary | ICD-10-CM

## 2019-08-14 ENCOUNTER — Other Ambulatory Visit: Payer: Self-pay | Admitting: *Deleted

## 2019-08-14 DIAGNOSIS — R232 Flushing: Secondary | ICD-10-CM

## 2019-08-14 DIAGNOSIS — I10 Essential (primary) hypertension: Secondary | ICD-10-CM

## 2019-08-14 DIAGNOSIS — E782 Mixed hyperlipidemia: Secondary | ICD-10-CM

## 2019-08-14 MED ORDER — EZETIMIBE 10 MG PO TABS: ORAL_TABLET | ORAL | 3 refills | Status: DC

## 2019-08-14 MED ORDER — QUINAPRIL HCL 20 MG PO TABS: ORAL_TABLET | ORAL | 3 refills | Status: DC

## 2019-08-14 MED ORDER — VENLAFAXINE HCL ER 37.5 MG PO CP24: 37.5000 mg | ORAL_CAPSULE | Freq: Every day | ORAL | 3 refills | Status: DC

## 2019-08-14 NOTE — Telephone Encounter (Signed)
Patient left a voicemail stating that she has had all of her old prescriptions transferred to [facility]. Patient stated that they need new scripts for Zetia, Quinapril and Venlafaxine.

## 2019-08-14 NOTE — Telephone Encounter (Signed)
Noted, refills sent to pharmacy. 

## 2019-08-14 NOTE — Telephone Encounter (Signed)
Spoken to patient and she stated that would Anda Kraft take over the Zetia and Quinapril for patient.  Please advise.

## 2019-09-20 ENCOUNTER — Other Ambulatory Visit: Payer: Self-pay | Admitting: Primary Care

## 2019-09-20 DIAGNOSIS — E782 Mixed hyperlipidemia: Secondary | ICD-10-CM

## 2019-09-20 DIAGNOSIS — D509 Iron deficiency anemia, unspecified: Secondary | ICD-10-CM

## 2019-09-20 DIAGNOSIS — Z114 Encounter for screening for human immunodeficiency virus [HIV]: Secondary | ICD-10-CM

## 2019-09-20 DIAGNOSIS — E039 Hypothyroidism, unspecified: Secondary | ICD-10-CM

## 2019-09-20 DIAGNOSIS — I10 Essential (primary) hypertension: Secondary | ICD-10-CM

## 2019-09-20 DIAGNOSIS — E1159 Type 2 diabetes mellitus with other circulatory complications: Secondary | ICD-10-CM

## 2019-09-24 ENCOUNTER — Telehealth: Payer: Self-pay

## 2019-09-24 ENCOUNTER — Other Ambulatory Visit: Payer: Self-pay | Admitting: Physician Assistant

## 2019-09-24 ENCOUNTER — Other Ambulatory Visit: Payer: Self-pay | Admitting: Primary Care

## 2019-09-24 DIAGNOSIS — R232 Flushing: Secondary | ICD-10-CM

## 2019-09-24 DIAGNOSIS — E782 Mixed hyperlipidemia: Secondary | ICD-10-CM

## 2019-09-24 NOTE — Telephone Encounter (Signed)
Prior Authorization completed in [email or web address] for propafenone HCl ER (Rhymol) 225MG  capsules KEY: [id number]  RESPONSE: Your information has been submitted to [employer]. [employer] is reviewing the PA request and you will receive an electronic response. You may check for the updated outcome later by reopening this request. The standard fax determination will also be sent to you directly.  If you have any questions about your PA submission, contact [employer] at [phone].

## 2019-09-25 MED ORDER — VENLAFAXINE HCL ER 37.5 MG PO CP24: ORAL_CAPSULE | ORAL | 1 refills | Status: DC

## 2019-09-25 NOTE — Addendum Note (Signed)
Addended by: Jacqualin Combes on: 09/25/2019 07:58 AM   Modules accepted: Orders

## 2019-09-25 NOTE — Telephone Encounter (Signed)
Incoming fax from [employer] states that this product does not require an authorization at this time.  Will place letter in file in med closet.

## 2019-09-27 ENCOUNTER — Other Ambulatory Visit: Payer: Self-pay | Admitting: Primary Care

## 2019-09-27 ENCOUNTER — Other Ambulatory Visit: Payer: Self-pay

## 2019-09-27 DIAGNOSIS — I10 Essential (primary) hypertension: Secondary | ICD-10-CM

## 2019-09-27 DIAGNOSIS — R232 Flushing: Secondary | ICD-10-CM

## 2019-09-27 MED ORDER — QUINAPRIL HCL 20 MG PO TABS: ORAL_TABLET | ORAL | 3 refills | Status: DC

## 2019-09-27 MED ORDER — ROSUVASTATIN CALCIUM 5 MG PO TABS: ORAL_TABLET | ORAL | 3 refills | Status: DC

## 2019-09-27 MED ORDER — METOPROLOL TARTRATE 25 MG PO TABS: 25.0000 mg | ORAL_TABLET | Freq: Two times a day (BID) | ORAL | 3 refills | Status: DC

## 2019-09-27 MED ORDER — PROPAFENONE HCL ER 225 MG PO CP12: 225.0000 mg | ORAL_CAPSULE | Freq: Two times a day (BID) | ORAL | 3 refills | Status: DC

## 2019-09-27 NOTE — Telephone Encounter (Signed)
*  STAT* If patient is at the pharmacy, call can be transferred to refill team.   1. Which medications need to be refilled? (please list name of each medication and dose if known) Crestor, Accupril, Propafenone, Metoprolol  2. Which pharmacy/location (including street and city if local pharmacy) is medication to be sent to? [facility]  3. Do they need a 30 day or 90 day supply? [place]

## 2019-09-30 ENCOUNTER — Other Ambulatory Visit: Payer: Self-pay | Admitting: Physician Assistant

## 2019-09-30 ENCOUNTER — Other Ambulatory Visit: Payer: Self-pay | Admitting: Primary Care

## 2019-09-30 DIAGNOSIS — E1159 Type 2 diabetes mellitus with other circulatory complications: Secondary | ICD-10-CM

## 2019-09-30 DIAGNOSIS — E782 Mixed hyperlipidemia: Secondary | ICD-10-CM

## 2019-09-30 DIAGNOSIS — E039 Hypothyroidism, unspecified: Secondary | ICD-10-CM

## 2019-09-30 DIAGNOSIS — K219 Gastro-esophageal reflux disease without esophagitis: Secondary | ICD-10-CM

## 2019-10-01 ENCOUNTER — Other Ambulatory Visit: Payer: Self-pay | Admitting: Physician Assistant

## 2019-10-03 MED ORDER — AMITRIPTYLINE HCL 50 MG PO TABS: 50.0000 mg | ORAL_TABLET | Freq: Every day | ORAL | 1 refills | Status: DC

## 2019-10-03 MED ORDER — LEVOTHYROXINE SODIUM 88 MCG PO TABS: ORAL_TABLET | ORAL | 1 refills | Status: DC

## 2019-10-03 MED ORDER — ACCU-CHEK FASTCLIX LANCETS MISC: 2 refills | Status: DC

## 2019-10-03 MED ORDER — GLIPIZIDE ER 5 MG PO TB24: ORAL_TABLET | ORAL | 1 refills | Status: DC

## 2019-10-03 MED ORDER — FAMOTIDINE 20 MG PO TABS: ORAL_TABLET | ORAL | 1 refills | Status: DC

## 2019-10-03 NOTE — Telephone Encounter (Signed)
Patient is switching back to mail order.

## 2019-10-07 ENCOUNTER — Other Ambulatory Visit (INDEPENDENT_AMBULATORY_CARE_PROVIDER_SITE_OTHER): Payer: Managed Care, Other (non HMO)

## 2019-10-07 ENCOUNTER — Other Ambulatory Visit: Payer: Self-pay

## 2019-10-07 DIAGNOSIS — E782 Mixed hyperlipidemia: Secondary | ICD-10-CM

## 2019-10-07 DIAGNOSIS — D509 Iron deficiency anemia, unspecified: Secondary | ICD-10-CM

## 2019-10-07 DIAGNOSIS — Z114 Encounter for screening for human immunodeficiency virus [HIV]: Secondary | ICD-10-CM

## 2019-10-07 DIAGNOSIS — I10 Essential (primary) hypertension: Secondary | ICD-10-CM | POA: Diagnosis not present

## 2019-10-07 DIAGNOSIS — E1159 Type 2 diabetes mellitus with other circulatory complications: Secondary | ICD-10-CM

## 2019-10-07 DIAGNOSIS — E039 Hypothyroidism, unspecified: Secondary | ICD-10-CM

## 2019-10-07 LAB — COMPREHENSIVE METABOLIC PANEL
ALT: 17 U/L (ref 0–35)
AST: 15 U/L (ref 0–37)
Albumin: 4.2 g/dL (ref 3.5–5.2)
Alkaline Phosphatase: 38 U/L — ABNORMAL LOW (ref 39–117)
BUN: 17 mg/dL (ref 6–23)
CO2: 30 mEq/L (ref 19–32)
Calcium: 9.6 mg/dL (ref 8.4–10.5)
Chloride: 98 mEq/L (ref 96–112)
Creatinine, Ser: 0.87 mg/dL (ref 0.40–1.20)
GFR: 66.05 mL/min (ref >60.00)
Glucose, Bld: 173 mg/dL — ABNORMAL HIGH (ref 70–99)
Potassium: 4.6 mEq/L (ref 3.5–5.1)
Sodium: 135 mEq/L (ref 135–145)
Total Bilirubin: 0.4 mg/dL (ref 0.2–1.2)
Total Protein: 6.8 g/dL (ref 6.0–8.3)

## 2019-10-07 LAB — CBC
HCT: 41.7 % (ref 36.0–46.0)
Hemoglobin: 13.9 g/dL (ref 12.0–15.0)
MCHC: 33.4 g/dL (ref 30.0–36.0)
MCV: 92.8 fl (ref 78.0–100.0)
Platelets: 326 10*3/uL (ref 150.0–400.0)
RBC: 4.49 Mil/uL (ref 3.87–5.11)
RDW: 14.1 % (ref 11.5–15.5)
WBC: 7.7 10*3/uL (ref 4.0–10.5)

## 2019-10-07 LAB — LIPID PANEL
Cholesterol: 120 mg/dL (ref 0–200)
HDL: 46.3 mg/dL (ref >39.00)
LDL Cholesterol: 49 mg/dL (ref 0–99)
NonHDL: 73.24
Total CHOL/HDL Ratio: 3
Triglycerides: 119 mg/dL (ref 0.0–149.0)
VLDL: 23.8 mg/dL (ref 0.0–40.0)

## 2019-10-07 LAB — TSH: TSH: 1.62 u[IU]/mL (ref 0.35–4.50)

## 2019-10-07 LAB — HEMOGLOBIN A1C: Hgb A1c MFr Bld: 7 % — ABNORMAL HIGH (ref 4.6–6.5)

## 2019-10-08 LAB — HIV ANTIBODY (ROUTINE TESTING W REFLEX): HIV 1&2 Ab, 4th Generation: NONREACTIVE

## 2019-10-10 ENCOUNTER — Other Ambulatory Visit: Payer: Self-pay

## 2019-10-10 ENCOUNTER — Encounter: Payer: Self-pay | Admitting: Primary Care

## 2019-10-10 ENCOUNTER — Ambulatory Visit (INDEPENDENT_AMBULATORY_CARE_PROVIDER_SITE_OTHER): Payer: Managed Care, Other (non HMO) | Admitting: Primary Care

## 2019-10-10 VITALS — BP 122/74 | HR 58 | Temp 96.2°F | Ht 66.0 in | Wt 174.2 lb

## 2019-10-10 DIAGNOSIS — Z0000 Encounter for general adult medical examination without abnormal findings: Secondary | ICD-10-CM

## 2019-10-10 DIAGNOSIS — Z1231 Encounter for screening mammogram for malignant neoplasm of breast: Secondary | ICD-10-CM | POA: Diagnosis not present

## 2019-10-10 DIAGNOSIS — M5441 Lumbago with sciatica, right side: Secondary | ICD-10-CM

## 2019-10-10 DIAGNOSIS — R232 Flushing: Secondary | ICD-10-CM

## 2019-10-10 DIAGNOSIS — I25119 Atherosclerotic heart disease of native coronary artery with unspecified angina pectoris: Secondary | ICD-10-CM

## 2019-10-10 DIAGNOSIS — E039 Hypothyroidism, unspecified: Secondary | ICD-10-CM

## 2019-10-10 DIAGNOSIS — M542 Cervicalgia: Secondary | ICD-10-CM

## 2019-10-10 DIAGNOSIS — E1159 Type 2 diabetes mellitus with other circulatory complications: Secondary | ICD-10-CM

## 2019-10-10 DIAGNOSIS — M8580 Other specified disorders of bone density and structure, unspecified site: Secondary | ICD-10-CM

## 2019-10-10 DIAGNOSIS — D509 Iron deficiency anemia, unspecified: Secondary | ICD-10-CM

## 2019-10-10 DIAGNOSIS — E782 Mixed hyperlipidemia: Secondary | ICD-10-CM

## 2019-10-10 DIAGNOSIS — I10 Essential (primary) hypertension: Secondary | ICD-10-CM

## 2019-10-10 DIAGNOSIS — I472 Ventricular tachycardia: Secondary | ICD-10-CM

## 2019-10-10 DIAGNOSIS — G8929 Other chronic pain: Secondary | ICD-10-CM

## 2019-10-10 DIAGNOSIS — I480 Paroxysmal atrial fibrillation: Secondary | ICD-10-CM

## 2019-10-10 DIAGNOSIS — K219 Gastro-esophageal reflux disease without esophagitis: Secondary | ICD-10-CM

## 2019-10-10 DIAGNOSIS — I4729 Other ventricular tachycardia: Secondary | ICD-10-CM

## 2019-10-10 NOTE — Assessment & Plan Note (Addendum)
Well controlled on HCTZ, quinapril, and verapamil, metoprolol tartrate.  Continue current regimen. CMP reviewed.

## 2019-10-10 NOTE — Assessment & Plan Note (Addendum)
Ongoing, prefers a second opinion. Referral placed for ortho/physical medicine.  Continue amitriptyline.

## 2019-10-10 NOTE — Assessment & Plan Note (Signed)
Asymptomatic. Continue BP control, diabetes, control, lipid control.

## 2019-10-10 NOTE — Assessment & Plan Note (Signed)
Compliant to calcium and vitamin D.  Add in weight bearing exercise.

## 2019-10-10 NOTE — Patient Instructions (Signed)
You will be contacted regarding your referral to Dr. Sharlet Salina.  Please let us know if you have not been contacted within two weeks.   Start exercising. You should be getting 150 minutes of moderate intensity exercise weekly.  Continue to work on a healthy diet. Ensure you are consuming 64 ounces of water daily.  Call the breast center to schedule your mammogram.  Please schedule a follow up appointment in 6 months for diabetes check.   It was a pleasure to see you today!   Preventive Care 40-35 Years Old, Female Preventive care refers to visits with your health care provider and lifestyle choices that can promote health and wellness. This includes:  A yearly physical exam. This may also be called an annual well check.  Regular dental visits and eye exams.  Immunizations.  Screening for certain conditions.  Healthy lifestyle choices, such as eating a healthy diet, getting regular exercise, not using drugs or products that contain nicotine and tobacco, and limiting alcohol use. What can I expect for my preventive care visit? Physical exam Your health care provider will check your:  Height and weight. This may be used to calculate body mass index (BMI), which tells if you are at a healthy weight.  Heart rate and blood pressure.  Skin for abnormal spots. Counseling Your health care provider may ask you questions about your:  Alcohol, tobacco, and drug use.  Emotional well-being.  Home and relationship well-being.  Sexual activity.  Eating habits.  Work and work Statistician.  Method of birth control.  Menstrual cycle.  Pregnancy history. What immunizations do I need?  Influenza (flu) vaccine  This is recommended every year. Tetanus, diphtheria, and pertussis (Tdap) vaccine  You may need a Td booster every 10 years. Varicella (chickenpox) vaccine  You may need this if you have not been vaccinated. Zoster (shingles) vaccine  You may need this after age  50. Measles, mumps, and rubella (MMR) vaccine  You may need at least one dose of MMR if you were born in 1957 or later. You may also need a second dose. Pneumococcal conjugate (PCV13) vaccine  You may need this if you have certain conditions and were not previously vaccinated. Pneumococcal polysaccharide (PPSV23) vaccine  You may need one or two doses if you smoke cigarettes or if you have certain conditions. Meningococcal conjugate (MenACWY) vaccine  You may need this if you have certain conditions. Hepatitis A vaccine  You may need this if you have certain conditions or if you travel or work in places where you may be exposed to hepatitis A. Hepatitis B vaccine  You may need this if you have certain conditions or if you travel or work in places where you may be exposed to hepatitis B. Haemophilus influenzae type b (Hib) vaccine  You may need this if you have certain conditions. Human papillomavirus (HPV) vaccine  If recommended by your health care provider, you may need three doses over 6 months. You may receive vaccines as individual doses or as more than one vaccine together in one shot (combination vaccines). Talk with your health care provider about the risks and benefits of combination vaccines. What tests do I need? Blood tests  Lipid and cholesterol levels. These may be checked every 5 years, or more frequently if you are over 66 years old.  Hepatitis C test.  Hepatitis B test. Screening  Lung cancer screening. You may have this screening every year starting at age 66 if you have a 30-pack-year history  of smoking and currently smoke or have quit within the past 15 years.  Colorectal cancer screening. All adults should have this screening starting at age 50 and continuing until age 49. Your health care provider may recommend screening at age 36 if you are at increased risk. You will have tests every 1-10 years, depending on your results and the type of screening  test.  Diabetes screening. This is done by checking your blood sugar (glucose) after you have not eaten for a while (fasting). You may have this done every 1-3 years.  Mammogram. This may be done every 1-2 years. Talk with your health care provider about when you should start having regular mammograms. This may depend on whether you have a family history of breast cancer.  BRCA-related cancer screening. This may be done if you have a family history of breast, ovarian, tubal, or peritoneal cancers.  Pelvic exam and Pap test. This may be done every 3 years starting at age 56. Starting at age 4, this may be done every 5 years if you have a Pap test in combination with an HPV test. Other tests  Sexually transmitted disease (STD) testing.  Bone density scan. This is done to screen for osteoporosis. You may have this scan if you are at high risk for osteoporosis. Follow these instructions at home: Eating and drinking  Eat a diet that includes fresh fruits and vegetables, whole grains, lean protein, and low-fat dairy.  Take vitamin and mineral supplements as recommended by your health care provider.  Do not drink alcohol if: ? Your health care provider tells you not to drink. ? You are pregnant, may be pregnant, or are planning to become pregnant.  If you drink alcohol: ? Limit how much you have to 0-1 drink a day. ? Be aware of how much alcohol is in your drink. In the [place] one drink equals one 12 oz bottle of beer (355 mL), one 5 oz glass of wine (148 mL), or one 1 oz glass of hard liquor (44 mL). Lifestyle  Take daily care of your teeth and gums.  Stay active. Exercise for at least 30 minutes on 5 or more days each week.  Do not use any products that contain nicotine or tobacco, such as cigarettes, e-cigarettes, and chewing tobacco. If you need help quitting, ask your health care provider.  If you are sexually active, practice safe sex. Use a condom or other form of birth control  (contraception) in order to prevent pregnancy and STIs (sexually transmitted infections).  If told by your health care provider, take low-dose aspirin daily starting at age 49. What's next?  Visit your health care provider once a year for a well check visit.  Ask your health care provider how often you should have your eyes and teeth checked.  Stay up to date on all vaccines. This information is not intended to replace advice given to you by your health care provider. Make sure you discuss any questions you have with your health care provider. Document Revised: 11/02/2017 Document Reviewed: 11/02/2017 Elsevier Patient Education  2020 [employer].

## 2019-10-10 NOTE — Assessment & Plan Note (Signed)
Well controlled on Crestor and Zetia, continue same.

## 2019-10-10 NOTE — Assessment & Plan Note (Signed)
Well controlled on levothyroxine 88 mcg. Discussed to separate iron, magnesium, vitamins four hours.  Continue levothyroxine 88 mcg.

## 2019-10-10 NOTE — Assessment & Plan Note (Signed)
Reduction in A1C to 7.0, commended her on this success! Encouraged to continue to work on diet, add in exercise.   Continue metformin and glipizide.  Managed on statin and ACE-I. Foot exam UTD. Eye exam scheduled for tomorrow.  Follow up in 6 months.

## 2019-10-10 NOTE — Assessment & Plan Note (Signed)
Well controlled on famotidine, continue same.

## 2019-10-10 NOTE — Assessment & Plan Note (Signed)
Recent CBC unremarkable.

## 2019-10-10 NOTE — Assessment & Plan Note (Signed)
Rate and rhythm regular today, continue beta blocker, verapamil, eliquis. Following with cardiology.

## 2019-10-10 NOTE — Assessment & Plan Note (Signed)
Well controlled on current regimen, continue same. Following with cardiology.

## 2019-10-10 NOTE — Progress Notes (Signed)
Subjective:    Patient ID: Debra Perez, female    DOB: January 16, 1956, 64 y.o.   MRN: [id number]  HPI  This visit occurred during the SARS-CoV-2 public health emergency.  Safety protocols were in place, including screening questions prior to the visit, additional usage of staff PPE, and extensive cleaning of exam room while observing appropriate contact time as indicated for disinfecting solutions.   Debra Perez is a 64 year old female who presents today for complete physical.  Immunizations: -Tetanus: Completed in 2016 -Influenza: Due this season  -Shingles: Completed Shingrix -Pneumonia: Completed last in 2021 -Covid-19: Completed series   Diet: She endorses a healthy diet, has eliminated carbs/sugar/pasta Exercise: She is not exercising   Eye exam: Due and scheduled for tomorrow.  Dental exam: Completes regularly   Pap Smear: Hysterectomy  Mammogram: Completed in 2020 Colonoscopy: Completed in 2019, due in 2024 Hep C Screen: Negative  BP Readings from Last 3 Encounters:  10/10/19 122/74  07/09/19 122/76  06/24/19 120/80   Wt Readings from Last 3 Encounters:  10/10/19 174 lb 4 oz (79 kg)  07/09/19 179 lb 8 oz (81.4 kg)  06/24/19 182 lb 2 oz (82.6 kg)    Review of Systems  Constitutional: Negative for unexpected weight change.  HENT: Negative for rhinorrhea.   Respiratory: Negative for cough and shortness of breath.   Cardiovascular: Negative for chest pain.  Gastrointestinal: Negative for constipation and diarrhea.  Genitourinary: Negative for difficulty urinating.  Musculoskeletal: Positive for arthralgias and back pain.  Skin: Negative for rash.  Allergic/Immunologic: Negative for environmental allergies.  Neurological: Negative for dizziness, numbness and headaches.  Psychiatric/Behavioral: The patient is not nervous/anxious.        Past Medical History:  Diagnosis Date  . 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 Diabetes mellitus without  complication ([place])   . Diverticulosis   . GERD (gastroesophageal reflux disease)   . History of colonic polyps    benign  . Hyperlipidemia   . Hypertension   . Hypothyroidism   . Lung infection   . Mitral valve prolapse   . NSVT (nonsustained ventricular tachycardia) ([place])   . Obstructive sleep apnea    on CPAP  . PAF (paroxysmal atrial fibrillation) ([place])    a.  On Eliquis; b. CHADS2VASc => 3 (HTN, DM, female)  . Sleep apnea      Social History   Socioeconomic History  . Marital status: Married    Spouse name: Not on file  . Number of children: Not on file  . Years of education: Not on file  . Highest education level: Not on file  Occupational History  . Occupation: retired  Tobacco Use  . Smoking status: Former Smoker    Packs/day: 1.00    Years: 20.00    Pack years: 20.00    Types: Cigarettes    Quit date: 03/07/2004    Years since quitting: 15.6  . Smokeless tobacco: Never Used  Vaping Use  . Vaping Use: Never used  Substance and Sexual Activity  . Alcohol use: No    Alcohol/week: 0.0 standard drinks  . Drug use: No  . Sexual activity: Not on file  Other Topics Concern  . Not on file  Social History Narrative   Married.   1 daughter.   Moved here from [place] due to husbands occupation.   She is a Corporate treasurer.   Enjoys sewing, gardening.   Social Determinants of Radio broadcast assistant  Strain:   . Difficulty of Paying Living Expenses:   Food Insecurity:   . Worried About Charity fundraiser in the Last Year:   . Arboriculturist in the Last Year:   Transportation Needs:   . Film/video editor (Medical):   Marland Kitchen Lack of Transportation (Non-Medical):   Physical Activity:   . Days of Exercise per Week:   . Minutes of Exercise per Session:   Stress:   . Feeling of Stress :   Social Connections:   . Frequency of Communication with Friends and Family:   . Frequency of Social Gatherings with Friends and Family:   . Attends Religious Services:   . Active  Member of Clubs or Organizations:   . Attends Archivist Meetings:   Marland Kitchen Marital Status:   Intimate Partner Violence:   . Fear of Current or Ex-Partner:   . Emotionally Abused:   Marland Kitchen Physically Abused:   . Sexually Abused:     Past Surgical History:  Procedure Laterality Date  . ABDOMINAL HYSTERECTOMY  2002   ovaries remain  . CLAVICLE SURGERY  2007   right clavicle plate pins  . COLONOSCOPY WITH PROPOFOL N/A 10/17/2017   Procedure: COLONOSCOPY WITH PROPOFOL;  Surgeon: Jonathon Bellows, MD;  [facility] ENDOSCOPY;  Service: Gastroenterology;  Laterality: N/A;  . ESOPHAGOGASTRODUODENOSCOPY (EGD) WITH PROPOFOL N/A 10/17/2017   Procedure: ESOPHAGOGASTRODUODENOSCOPY (EGD) WITH PROPOFOL;  Surgeon: Jonathon Bellows, MD;  Location[facility] ENDOSCOPY;  Service: Gastroenterology;  Laterality: N/A;  . GIVENS CAPSULE STUDY N/A 11/30/2017   Procedure: GIVENS CAPSULE STUDY;  Surgeon: Jonathon Bellows, MD;  Location: [facility] ENDOSCOPY;  Service: Gastroenterology;  Laterality: N/A;  . KNEE ARTHROSCOPY  2012    Family History  Problem Relation Age of Onset  . Depression Mother   . Sudden death Mother 75  . Heart attack Mother   . Arthritis Father   . Heart disease Father   . Hypertension Father   . Emphysema Father   . Bladder Cancer Sister   . Diabetes Sister        Type 2  . Diabetes Brother        type  . Hypertension Brother   . Emphysema Brother   . Heart disease Brother   . Diabetes Maternal Grandfather   . Bladder Cancer Maternal Grandmother   . Breast cancer Paternal Grandmother   . Cancer Paternal Grandfather        colon cancer  . Diabetes Paternal Grandfather     No Known Allergies  Current Outpatient Medications on File Prior to Visit  Medication Sig Dispense Refill  . Accu-Chek FastClix Lancets MISC Use as instructed to test blood sugar 2 times daily 300 each 2  . [name] 50 MG tablet Take 1 tablet (50 mg total) by mouth at bedtime. 90 tablet 1  . apixaban  (ELIQUIS) 5 MG TABS tablet Take 1 tablet (5 mg total) by mouth 2 (two) times daily. 180 tablet 1  . b complex vitamins capsule Take 1 capsule by mouth 2 (two) times daily.    Marland Kitchen ezetimibe (ZETIA) 10 MG tablet TAKE 1 TABLET BY MOUTH DAILY for cholesterol. 90 tablet 3  . famotidine (PEPCID) 20 MG tablet TAKE 1 TABLET BY MOUTH TWICE DAILY FOR HEARTBURN 180 tablet 1  . fluticasone (FLONASE) 50 MCG/ACT nasal spray Place 1 spray into both nostrils 2 (two) times daily. 16 g 0  . glipiZIDE (GLUCOTROL XL) 5 MG 24 hr tablet Take 1qd 90 tablet  1  . glucose blood (ACCU-CHEK GUIDE) test strip Use to test blood sugar up to 2 times daily 300 each 2  . Iron-Vitamin C (VITRON-C) 65-125 MG TABS Take by mouth daily.     Marland Kitchen levothyroxine (SYNTHROID) 88 MCG tablet TAKE 1 TABLET BY MOUTH EVERY MORNING WITH WATER ONLY. NO FOOD OR MEDICATIONS FOR 30 MINUTES. GENERIC EQUIVALENT FOR SYNTHROID. 90 tablet 1  . Magnesium 500 MG CAPS Take 1 capsule by mouth 2 (two) times daily.    . metFORMIN (GLUCOPHAGE-XR) 500 MG 24 hr tablet Take 2 tablets (1,000 mg total) by mouth daily with breakfast. For diabetes. 180 tablet 3  . metoprolol tartrate (LOPRESSOR) 25 MG tablet Take 1 tablet (25 mg total) by mouth 2 (two) times daily. 180 tablet 3  . Multiple Vitamins-Minerals (MULTIVITAMIN ADULT PO) Take 1 capsule by mouth daily.     . propafenone (RYTHMOL SR) 225 MG 12 hr capsule Take 1 capsule (225 mg total) by mouth 2 (two) times daily. 180 capsule 3  . quinapril (ACCUPRIL) 20 MG tablet TAKE 1 TABLET BY MOUTH AT BEDTIME for blood pressure. 90 tablet 3  . rosuvastatin (CRESTOR) 5 MG tablet TAKE 1 TABLET BY MOUTH DAILY GENERIC EQUIVALENT FOR CRESTOR 90 tablet 3  . venlafaxine XR (EFFEXOR-XR) 37.5 MG 24 hr capsule TAKE 1 CAPSULE(37.5MG  TOTAL) BY MOUTH DAILY WITH BREAKFAST 90 capsule 1  . verapamil (CALAN-SR) 240 MG CR tablet Take 1 tablet (240 mg total) by mouth daily. 90 tablet 2  . hydrochlorothiazide (HYDRODIURIL) 25 MG tablet Take 1 tablet  (25 mg total) by mouth daily. 90 tablet 3   Current Facility-Administered Medications on File Prior to Visit  Medication Dose Route Frequency Provider Last Rate Last Admin  . heparin lock flush 100 unit/mL  500 Units Intracatheter Once PRN Earlie Server, MD      . sodium chloride flush (NS) 0.9 % injection 10 mL  10 mL Intracatheter PRN Earlie Server, MD   10 mL at 10/06/17 1515    BP 122/74   Pulse (!) 58   Temp (!) 96.2 F (35.7 C) (Temporal)   Ht 5\' 6"  (1.676 m)   Wt 174 lb 4 oz (79 kg)   SpO2 98%   BMI 28.12 kg/m    Objective:   Physical Exam HENT:     Right Ear: Tympanic membrane and ear canal normal.     Left Ear: Tympanic membrane and ear canal normal.  Eyes:     Pupils: Pupils are equal, round, and reactive to light.  Cardiovascular:     Rate and Rhythm: Normal rate and regular rhythm.  Pulmonary:     Effort: Pulmonary effort is normal.     Breath sounds: Normal breath sounds.  Abdominal:     General: Bowel sounds are normal.     Palpations: Abdomen is soft.     Tenderness: There is no abdominal tenderness.  Musculoskeletal:        General: Normal range of motion.     Cervical back: Neck supple.  Skin:    General: Skin is warm and dry.  Neurological:     Mental Status: She is alert and oriented to person, place, and time.     Cranial Nerves: No cranial nerve deficit.     Deep Tendon Reflexes:     Reflex Scores:      Patellar reflexes are 2+ on the right side and 2+ on the left side. Psychiatric:        Mood and  Affect: Mood normal.            Assessment & Plan:

## 2019-10-10 NOTE — Assessment & Plan Note (Signed)
Immunizations UTD. Mammogram due, orders placed. Colonoscopy UTD, due in 2024. Discussed the importance of a healthy diet and regular exercise in order for weight loss, and to reduce the risk of any potential medical problems.  Exam today stable. Labs reviewed.

## 2019-10-10 NOTE — Assessment & Plan Note (Signed)
Doing well on venlafaxine, continue same.

## 2019-10-11 LAB — HM DIABETES EYE EXAM

## 2019-10-14 ENCOUNTER — Other Ambulatory Visit: Payer: Self-pay | Admitting: Physical Medicine and Rehabilitation

## 2019-10-14 DIAGNOSIS — M5416 Radiculopathy, lumbar region: Secondary | ICD-10-CM

## 2019-10-26 DIAGNOSIS — E1159 Type 2 diabetes mellitus with other circulatory complications: Secondary | ICD-10-CM

## 2019-10-28 MED ORDER — GLIPIZIDE ER 10 MG PO TB24: 10.0000 mg | ORAL_TABLET | Freq: Every day | ORAL | 1 refills | Status: DC

## 2019-10-30 ENCOUNTER — Ambulatory Visit
Admission: RE | Admit: 2019-10-30 | Discharge: 2019-10-30 | Disposition: A | Discharge status: Home / Self Care | Payer: Managed Care, Other (non HMO) | Source: Ambulatory Visit | Attending: Physical Medicine and Rehabilitation | Admitting: Physical Medicine and Rehabilitation

## 2019-10-30 ENCOUNTER — Other Ambulatory Visit: Payer: Self-pay

## 2019-10-30 DIAGNOSIS — M5416 Radiculopathy, lumbar region: Secondary | ICD-10-CM | POA: Diagnosis present

## 2019-11-01 ENCOUNTER — Encounter: Payer: Self-pay | Admitting: Primary Care

## 2019-11-12 DIAGNOSIS — E1159 Type 2 diabetes mellitus with other circulatory complications: Secondary | ICD-10-CM

## 2019-11-12 MED ORDER — METFORMIN HCL ER 500 MG PO TB24: 1000.0000 mg | ORAL_TABLET | Freq: Every day | ORAL | 3 refills | Status: DC

## 2019-11-27 ENCOUNTER — Ambulatory Visit: Payer: Managed Care, Other (non HMO) | Admitting: Dermatology

## 2019-12-19 ENCOUNTER — Ambulatory Visit: Payer: Managed Care, Other (non HMO) | Admitting: Dermatology

## 2020-01-16 ENCOUNTER — Ambulatory Visit (INDEPENDENT_AMBULATORY_CARE_PROVIDER_SITE_OTHER): Payer: Self-pay | Admitting: Dermatology

## 2020-01-16 ENCOUNTER — Other Ambulatory Visit: Payer: Self-pay

## 2020-01-16 ENCOUNTER — Ambulatory Visit
Admission: RE | Admit: 2020-01-16 | Discharge: 2020-01-16 | Disposition: A | Discharge status: Home / Self Care | Payer: Managed Care, Other (non HMO) | Source: Ambulatory Visit | Attending: Primary Care | Admitting: Primary Care

## 2020-01-16 DIAGNOSIS — I8393 Asymptomatic varicose veins of bilateral lower extremities: Secondary | ICD-10-CM

## 2020-01-16 DIAGNOSIS — Z1231 Encounter for screening mammogram for malignant neoplasm of breast: Secondary | ICD-10-CM | POA: Diagnosis not present

## 2020-01-16 NOTE — Progress Notes (Signed)
   Follow-Up Visit   Subjective  Debra Perez is a 64 y.o. female who presents for the following: vericose veins (bil lower legs, pt presents for sclerotherapy).  The following portions of the chart were reviewed this encounter and updated as appropriate:  Tobacco  Allergies  Meds  Problems  Med Hx  Surg Hx  Fam Hx     Review of Systems:  No other skin or systemic complaints except as noted in HPI or Assessment and Plan.  Objective  Well appearing patient in no apparent distress; mood and affect are within normal limits.  A focused examination was performed including bil legs. Relevant physical exam findings are noted in the Assessment and Plan.  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 The patient was given the opportunity to ask questions. Asclera (polidocanol) 1% (total 4cc) was injected into the varices. In order to ensure correct placement of the catheter in the vein, I drew back slightly to give moderate blood show in the syringe. If there was any evidence or suspicion of extravasation of sclerosant, the area was immediately diluted with a large volume of 0.9% saline. A pressure dressing was applied immediately to the injected sites. The patient tolerated the procedure well without complication. The patient was instructed in post-operative compression  stocking use. The patient understands to call or return immediately if any problems noted.   Return if symptoms worsen or fail to improve.   I, Othelia Pulling, RMA, am acting as scribe for Sarina Ser, MD .  Documentation: I have reviewed the above documentation for accuracy and completeness, and I agree with the above.  Sarina Ser, MD

## 2020-01-16 NOTE — Patient Instructions (Signed)
BEFORE YOUR APPOINTMENT FOR SCLEROTHERAPY  1. When you telephone for your appointment for the sclerotherapy procedure, please let the receptionist know that you are scheduling for the fifteen (15) minute sclerotherapy procedure not just a regular visit.  2. On the day of the procedure, please cleanse and dry the areas, but do not use any moisturizers or other products on the area(s) to be treated.  3. Bring a pair of comfortable shorts to wear during the procedure.  4. Be sure to bring your recommended graduated compression stockings with you to the office. You will be wearing them home when your visit is over. These compression hose can be purchased at most medical supply stores.  After Your Sclerotherapy Procedure  1. Please wear the graduated compression stockings for 24 hours immediately following the completion of the sclerotherapy procedure.  2. We recommend that you avoid vigorous activity as much as possible for the first twenty-four (24) hours. You can do your "normal" routine, but avoid an above normal amount of time on your feet. Elevating the legs when sitting and avoidance of vigorous leg movements or exercise in the first few days after treatment may improve your results.  3. You may remove the compression dressings (cotton balls) and tape the next morning.  4. Please continue wearing the compression stockings during waking hours for the two (2) weeks following sclerotherapy.  5. If you have any blisters, sores or ulcers or other problems following your procedure please call or return to the office immediately.     THE PROCEDURE FEE IS $350.00 PER FIFTEEN (15) MINUTE SESSION. WE REQUIRE THAT THIS PROCEDURE BE PAID FOR IN FULL ON OR BEFORE THE DATE THAT IT IS PERFORMED. WE WILL GIVE YOU A RECEIPT THAT YOU CAN FILE WITH YOUR INSURANCE COMPANY. WE GENERALLY DO NOT FILE THIS PROCEDURE WITH ANY INSURANCE COMPANY EXCEPT UNDER CERTAIN CIRCUMSTANCES WHERE PRIOR AUTHORIZATION HAS BEEN  CONFIRMED. THIS PROCEDURE IS GENERALLY CONSIDERED TO BE A COSMETIC PROCEDURE BY INSURANCE COMPANIES. 

## 2020-01-17 ENCOUNTER — Ambulatory Visit: Payer: Managed Care, Other (non HMO) | Admitting: Primary Care

## 2020-01-20 ENCOUNTER — Ambulatory Visit (INDEPENDENT_AMBULATORY_CARE_PROVIDER_SITE_OTHER): Payer: Managed Care, Other (non HMO) | Admitting: Primary Care

## 2020-01-20 ENCOUNTER — Other Ambulatory Visit: Payer: Self-pay

## 2020-01-20 ENCOUNTER — Encounter: Payer: Self-pay | Admitting: Primary Care

## 2020-01-20 ENCOUNTER — Encounter: Payer: Self-pay | Admitting: Dermatology

## 2020-01-20 VITALS — BP 124/72 | HR 76 | Temp 97.9°F | Ht 66.0 in | Wt 178.0 lb

## 2020-01-20 DIAGNOSIS — J3489 Other specified disorders of nose and nasal sinuses: Secondary | ICD-10-CM | POA: Diagnosis not present

## 2020-01-20 NOTE — Patient Instructions (Signed)
You will be contacted regarding your referral to ENT.  Please let us know if you have not been contacted within two weeks.   It was a pleasure to see you today!

## 2020-01-20 NOTE — Progress Notes (Signed)
Subjective:    Patient ID: Debra Perez, female    DOB: [birth year], 64 y.o.   MRN: [id number]  HPI  This visit occurred during the SARS-CoV-2 public health emergency.  Safety protocols were in place, including screening questions prior to the visit, additional usage of staff PPE, and extensive cleaning of exam room while observing appropriate contact time as indicated for disinfecting solutions.   Debra Perez is a 64 year old female with a history of cardiac disease, hypothyroidism who presents today with a chief complaint of nasal cavity mass. The mass has been noticeable for the last 8+ months which is located to the medial wall of her left nares. She's been using triple antibiotic ointment, nasal saline spray, and hydrogen peroxide (once) without improvement. Her mass is not painful, but she does notice bloody mucous from the left nasal cavity when blowing her nose or gargling. The mass has enlarged.   Review of Systems  Constitutional: Negative for fever.  HENT: Negative for congestion.        Left nasal cavity mass   Respiratory: Negative for shortness of breath.        Past Medical History:  Diagnosis Date  . 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 Diabetes mellitus without complication ([place])   . Diverticulosis   . GERD (gastroesophageal reflux disease)   . History of colonic polyps    benign  . Hyperlipidemia   . Hypertension   . Hypothyroidism   . Lung infection   . Mitral valve prolapse   . NSVT (nonsustained ventricular tachycardia) ([place])   . Obstructive sleep apnea    on CPAP  . PAF (paroxysmal atrial fibrillation) ([place])    a.  On Eliquis; b. CHADS2VASc => 3 (HTN, DM, female)  . Sleep apnea      Social History   Socioeconomic History  . Marital status: Married    Spouse name: Not on file  . Number of children: Not on file  . Years of education: Not on file  . Highest education level: Not on file  Occupational History  . Occupation: retired    Tobacco Use  . Smoking status: Former Smoker    Packs/day: 1.00    Years: 20.00    Pack years: 20.00    Types: Cigarettes    Quit date: 03/07/2004    Years since quitting: 15.8  . Smokeless tobacco: Never Used  Vaping Use  . Vaping Use: Never used  Substance and Sexual Activity  . Alcohol use: No    Alcohol/week: 0.0 standard drinks  . Drug use: No  . Sexual activity: Not on file  Other Topics Concern  . Not on file  Social History Narrative   Married.   1 daughter.   Moved here from [place] due to husbands occupation.   She is a Corporate treasurer.   Enjoys sewing, gardening.   Social Determinants of Health   Financial Resource Strain:   . Difficulty of Paying Living Expenses: Not on file  Food Insecurity:   . Worried About Charity fundraiser in the Last Year: Not on file  . Ran Out of Food in the Last Year: Not on file  Transportation Needs:   . Lack of Transportation (Medical): Not on file  . Lack of Transportation (Non-Medical): Not on file  Physical Activity:   . Days of Exercise per Week: Not on file  . Minutes of Exercise per Session: Not on file  Stress:   .  Feeling of Stress : Not on file  Social Connections:   . Frequency of Communication with Friends and Family: Not on file  . Frequency of Social Gatherings with Friends and Family: Not on file  . Attends Religious Services: Not on file  . Active Member of Clubs or Organizations: Not on file  . Attends Archivist Meetings: Not on file  . Marital Status: Not on file  Intimate Partner Violence:   . Fear of Current or Ex-Partner: Not on file  . Emotionally Abused: Not on file  . Physically Abused: Not on file  . Sexually Abused: Not on file    Past Surgical History:  Procedure Laterality Date  . ABDOMINAL HYSTERECTOMY  2002   ovaries remain  . CLAVICLE SURGERY  2007   right clavicle plate pins  . COLONOSCOPY WITH PROPOFOL N/A 10/17/2017   Procedure: COLONOSCOPY WITH PROPOFOL;  Surgeon: Jonathon Bellows, MD;   Location[facility] ENDOSCOPY;  Service: Gastroenterology;  Laterality: N/A;  . ESOPHAGOGASTRODUODENOSCOPY (EGD) WITH PROPOFOL N/A 10/17/2017   Procedure: ESOPHAGOGASTRODUODENOSCOPY (EGD) WITH PROPOFOL;  Surgeon: Jonathon Bellows, MD;  Location: [facility] ENDOSCOPY;  Service: Gastroenterology;  Laterality: N/A;  . GIVENS CAPSULE STUDY N/A 11/30/2017   Procedure: GIVENS CAPSULE STUDY;  Surgeon: Jonathon Bellows, MD;  Location: [facility] ENDOSCOPY;  Service: Gastroenterology;  Laterality: N/A;  . KNEE ARTHROSCOPY  2012    Family History  Problem Relation Age of Onset  . Depression Mother   . Sudden death Mother 24  . Heart attack Mother   . Arthritis Father   . Heart disease Father   . Hypertension Father   . Emphysema Father   . Bladder Cancer Sister   . Diabetes Sister        Type 2  . Diabetes Brother        type  . Hypertension Brother   . Emphysema Brother   . Heart disease Brother   . Diabetes Maternal Grandfather   . Bladder Cancer Maternal Grandmother   . Breast cancer Paternal Grandmother   . Cancer Paternal Grandfather        colon cancer  . Diabetes Paternal Grandfather     No Known Allergies  Current Outpatient Medications on File Prior to Visit  Medication Sig Dispense Refill  . Accu-Chek FastClix Lancets MISC Use as instructed to test blood sugar 2 times daily 300 each 2  . [name] 50 MG tablet Take 1 tablet (50 mg total) by mouth at bedtime. 90 tablet 1  . apixaban (ELIQUIS) 5 MG TABS tablet Take 1 tablet (5 mg total) by mouth 2 (two) times daily. 180 tablet 1  . b complex vitamins capsule Take 1 capsule by mouth 2 (two) times daily.    Marland Kitchen ezetimibe (ZETIA) 10 MG tablet TAKE 1 TABLET BY MOUTH DAILY for cholesterol. 90 tablet 3  . famotidine (PEPCID) 20 MG tablet TAKE 1 TABLET BY MOUTH TWICE DAILY FOR HEARTBURN 180 tablet 1  . fluticasone (FLONASE) 50 MCG/ACT nasal spray Place 1 spray into both nostrils 2 (two) times daily. 16 g 0  . glipiZIDE (GLUCOTROL XL) 10 MG 24 hr  tablet Take 1 tablet (10 mg total) by mouth daily with breakfast. For diabetes. 90 tablet 1  . glucose blood (ACCU-CHEK GUIDE) test strip Use to test blood sugar up to 2 times daily 300 each 2  . Iron-Vitamin C (VITRON-C) 65-125 MG TABS Take by mouth daily.     Marland Kitchen levothyroxine (SYNTHROID) 88 MCG tablet TAKE 1 TABLET BY  MOUTH EVERY MORNING WITH WATER ONLY. NO FOOD OR MEDICATIONS FOR 30 MINUTES. GENERIC EQUIVALENT FOR SYNTHROID. 90 tablet 1  . Magnesium 500 MG CAPS Take 1 capsule by mouth 2 (two) times daily.    . metFORMIN (GLUCOPHAGE-XR) 500 MG 24 hr tablet Take 2 tablets (1,000 mg total) by mouth daily with breakfast. For diabetes. 180 tablet 3  . metoprolol tartrate (LOPRESSOR) 25 MG tablet Take 1 tablet (25 mg total) by mouth 2 (two) times daily. 180 tablet 3  . Multiple Vitamins-Minerals (MULTIVITAMIN ADULT PO) Take 1 capsule by mouth daily.     . propafenone (RYTHMOL SR) 225 MG 12 hr capsule Take 1 capsule (225 mg total) by mouth 2 (two) times daily. 180 capsule 3  . quinapril (ACCUPRIL) 20 MG tablet TAKE 1 TABLET BY MOUTH AT BEDTIME for blood pressure. 90 tablet 3  . rosuvastatin (CRESTOR) 5 MG tablet TAKE 1 TABLET BY MOUTH DAILY GENERIC EQUIVALENT FOR CRESTOR 90 tablet 3  . venlafaxine XR (EFFEXOR-XR) 37.5 MG 24 hr capsule TAKE 1 CAPSULE(37.5MG  TOTAL) BY MOUTH DAILY WITH BREAKFAST 90 capsule 1  . verapamil (CALAN-SR) 240 MG CR tablet Take 1 tablet (240 mg total) by mouth daily. 90 tablet 2  . hydrochlorothiazide (HYDRODIURIL) 25 MG tablet Take 1 tablet (25 mg total) by mouth daily. 90 tablet 3   Current Facility-Administered Medications on File Prior to Visit  Medication Dose Route Frequency Provider Last Rate Last Admin  . heparin lock flush 100 unit/mL  500 Units Intracatheter Once PRN Earlie Server, MD      . sodium chloride flush (NS) 0.9 % injection 10 mL  10 mL Intracatheter PRN Earlie Server, MD   10 mL at 10/06/17 1515    BP 124/72   Pulse 76   Temp 97.9 F (36.6 C) (Temporal)   Ht 5'  6" (1.676 m)   Wt 178 lb (80.7 kg)   SpO2 96%   BMI 28.73 kg/m    Objective:   Physical Exam HENT:     Nose:     Left Nostril: No foreign body, epistaxis or occlusion.     Left Turbinates: Not enlarged.     Comments: Small 0.25 cm mass to left medial nasal wall.  Pulmonary:     Effort: Pulmonary effort is normal.  Neurological:     Mental Status: She is alert.            Assessment & Plan:

## 2020-01-20 NOTE — Assessment & Plan Note (Signed)
Chronic for nearly one year, enlarging and bleeding intermittently.   Exam today suggestive of nasal polyp, but given enlargement with bleeding, will refer to ENT for evaluation.

## 2020-02-11 ENCOUNTER — Encounter: Payer: Self-pay | Admitting: Primary Care

## 2020-03-06 ENCOUNTER — Other Ambulatory Visit: Payer: Self-pay | Admitting: Primary Care

## 2020-03-06 DIAGNOSIS — R232 Flushing: Secondary | ICD-10-CM

## 2020-03-09 ENCOUNTER — Telehealth: Payer: Self-pay

## 2020-03-09 ENCOUNTER — Other Ambulatory Visit: Payer: Self-pay

## 2020-03-09 MED ORDER — APIXABAN 5 MG PO TABS
5.0000 mg | ORAL_TABLET | Freq: Two times a day (BID) | ORAL | 1 refills | Status: DC
Start: 2020-03-09 — End: 2020-06-23

## 2020-03-09 MED ORDER — HYDROCHLOROTHIAZIDE 25 MG PO TABS: 25.0000 mg | ORAL_TABLET | Freq: Every day | ORAL | 0 refills | Status: DC

## 2020-03-09 NOTE — Addendum Note (Signed)
Addended by: Rhea Belton R on: 03/09/2020 12:00 PM   Modules accepted: Orders

## 2020-03-09 NOTE — Telephone Encounter (Signed)
1. Which medications need to be refilled? (please list name of each medication and dose if known) Eliquis    2. Which pharmacy/location (including street and city if local pharmacy) is medication to be sent to? [facility]  3. Do they need a 30 day or 90 day supply? 90

## 2020-03-09 NOTE — Telephone Encounter (Signed)
Pt's age 65, wt 80.7 kg, SCr 0.87, CrCl 85.42, last ov w TG 06/24/19.

## 2020-03-15 ENCOUNTER — Other Ambulatory Visit: Payer: Self-pay | Admitting: Primary Care

## 2020-03-17 NOTE — Telephone Encounter (Signed)
Alice, will you add patient on for 01/12 at 12p?

## 2020-03-18 ENCOUNTER — Encounter: Payer: Self-pay | Admitting: Primary Care

## 2020-03-18 ENCOUNTER — Other Ambulatory Visit: Payer: Self-pay

## 2020-03-18 ENCOUNTER — Telehealth (INDEPENDENT_AMBULATORY_CARE_PROVIDER_SITE_OTHER): Payer: Managed Care, Other (non HMO) | Admitting: Primary Care

## 2020-03-18 VITALS — Temp 98.0°F | Ht 66.0 in | Wt 175.0 lb

## 2020-03-18 DIAGNOSIS — L0390 Cellulitis, unspecified: Secondary | ICD-10-CM | POA: Insufficient documentation

## 2020-03-18 DIAGNOSIS — L03116 Cellulitis of left lower limb: Secondary | ICD-10-CM

## 2020-03-18 DIAGNOSIS — E1159 Type 2 diabetes mellitus with other circulatory complications: Secondary | ICD-10-CM

## 2020-03-18 MED ORDER — CEPHALEXIN 500 MG PO CAPS: 500.0000 mg | ORAL_CAPSULE | Freq: Three times a day (TID) | ORAL | 0 refills | Status: AC

## 2020-03-18 NOTE — Assessment & Plan Note (Signed)
Two week history of non healing/progressing wound in the setting of hyperglycemia with type 2 diabetes.  Given wound appearance coupled with non healing properties, will send in cephalexin course for treatment. She will send an updated picture next week and will call sooner if no improvement.

## 2020-03-18 NOTE — Patient Instructions (Signed)
Start cephalexin 500 mg, take 1 capsule three times daily for infection.  Please send me an updated picture next week.   Increase your metformin to one extra tablet daily.   We will see you next month! [name], NP-C

## 2020-03-18 NOTE — Progress Notes (Signed)
Subjective:    Patient ID: Debra Perez, female    DOB: Mar 09, 1955, 65 y.o.   MRN: [id number]  HPI  Virtual Visit via Video Note  I connected with Debra Perez on 03/18/20 at 12:00 PM EST by a video enabled telemedicine application and verified that I am speaking with the correct person using two identifiers.  We attempted to connect via video but she could not get her camera to work. We conducted our conversation via phone which lasted 11 min.  Location: Patient: Home Provider: Office Participants: patient and myself   I discussed the limitations of evaluation and management by telemedicine and the availability of in person appointments. The patient expressed understanding and agreed to proceed.  History of Present Illness:  Debra Perez is a 65 year old female with a history of type 2 diabetes, hypertension, CAD, hypothyroidism, hyperlipidemia who presents today with a chief complaint of wound.  Her wound is located to the left medial malleolus for which she first noticed about 2 weeks ago. Since then she's noticed gradual increase in size and pain. She denies redness and swelling around the site. She denies injury/trauma, fevers.   She's applied gentamycin ointment, triple antibiotic ointment, betadine, hydrogen peroxide without improvement. Her wound is not healing. Her blood sugars are running 150's-201.   Observations/Objective:  Alert and oriented. No distress. Speaking in complete sentences.  1 cm rounded open wound to left medial ankle. Whitish appearance inside. No significant surrounding erythema ro swelling. See attached picture under media tab.  Assessment and Plan:  Two week history of non healing/progressing wound in the setting of hyperglycemia with type 2 diabetes.  Given wound appearance coupled with non healing properties, will send in cephalexin course for treatment. She will send an updated picture next week and will call sooner if no  improvement.  Follow Up Instructions:  Start cephalexin 500 mg, take 1 capsule three times daily for infection.  Please send me an updated picture next week.   Increase your metformin to one extra tablet daily.   We will see you next month! Debra Bossier, NP-C    I discussed the assessment and treatment plan with the patient. The patient was provided an opportunity to ask questions and all were answered. The patient agreed with the plan and demonstrated an understanding of the instructions.   The patient was advised to call back or seek an in-person evaluation if the symptoms worsen or if the condition fails to improve as anticipated.    Debra [name], NP    Review of Systems  Constitutional: Negative for fever.  Skin: Positive for color change and wound.       Past Medical History:  Diagnosis Date  . 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 Diabetes mellitus without complication ([place])   . Diverticulosis   . GERD (gastroesophageal reflux disease)   . History of colonic polyps    benign  . Hyperlipidemia   . Hypertension   . Hypothyroidism   . Lung infection   . Mitral valve prolapse   . NSVT (nonsustained ventricular tachycardia) ([place])   . Obstructive sleep apnea    on CPAP  . PAF (paroxysmal atrial fibrillation) ([place])    a.  On Eliquis; b. CHADS2VASc => 3 (HTN, DM, female)  . Sleep apnea      Social History   Socioeconomic History  . Marital status: Married    Spouse name: Not on file  . Number of  children: Not on file  . Years of education: Not on file  . Highest education level: Not on file  Occupational History  . Occupation: retired  Tobacco Use  . Smoking status: Former Smoker    Packs/day: 1.00    Years: 20.00    Pack years: 20.00    Types: Cigarettes    Quit date: 03/07/2004    Years since quitting: 16.0  . Smokeless tobacco: Never Used  Vaping Use  . Vaping Use: Never used  Substance and Sexual Activity  . Alcohol use: No     Alcohol/week: 0.0 standard drinks  . Drug use: No  . Sexual activity: Not on file  Other Topics Concern  . Not on file  Social History Narrative   Married.   1 daughter.   Moved here from [place] due to husbands occupation.   She is a Corporate treasurer.   Enjoys sewing, gardening.   Social Determinants of Health   Financial Resource Strain: Not on file  Food Insecurity: Not on file  Transportation Needs: Not on file  Physical Activity: Not on file  Stress: Not on file  Social Connections: Not on file  Intimate Partner Violence: Not on file    Past Surgical History:  Procedure Laterality Date  . ABDOMINAL HYSTERECTOMY  2002   ovaries remain  . CLAVICLE SURGERY  2007   right clavicle plate pins  . COLONOSCOPY WITH PROPOFOL N/A 10/17/2017   Procedure: COLONOSCOPY WITH PROPOFOL;  Surgeon: Jonathon Bellows, MD;  Location: [facility] ENDOSCOPY;  Service: Gastroenterology;  Laterality: N/A;  . ESOPHAGOGASTRODUODENOSCOPY (EGD) WITH PROPOFOL N/A 10/17/2017   Procedure: ESOPHAGOGASTRODUODENOSCOPY (EGD) WITH PROPOFOL;  Surgeon: Jonathon Bellows, MD;  Location: [facility] ENDOSCOPY;  Service: Gastroenterology;  Laterality: N/A;  . GIVENS CAPSULE STUDY N/A 11/30/2017   Procedure: GIVENS CAPSULE STUDY;  Surgeon: Jonathon Bellows, MD;  Location: [facility] ENDOSCOPY;  Service: Gastroenterology;  Laterality: N/A;  . KNEE ARTHROSCOPY  2012    Family History  Problem Relation Age of Onset  . Depression Mother   . Sudden death Mother 60  . Heart attack Mother   . Arthritis Father   . Heart disease Father   . Hypertension Father   . Emphysema Father   . Bladder Cancer Sister   . Diabetes Sister        Type 2  . Diabetes Brother        type  . Hypertension Brother   . Emphysema Brother   . Heart disease Brother   . Diabetes Maternal Grandfather   . Bladder Cancer Maternal Grandmother        sinus cancer   . Breast cancer Paternal Grandmother   . Cancer Paternal Grandfather        colon cancer  . Diabetes Paternal Grandfather      No Known Allergies  Current Outpatient Medications on File Prior to Visit  Medication Sig Dispense Refill  . Accu-Chek FastClix Lancets MISC Use as instructed to test blood sugar 2 times daily 300 each 2  . [name] 50 MG tablet TAKE 1 TABLET BY MOUTH AT BEDTIME 90 tablet 1  . apixaban (ELIQUIS) 5 MG TABS tablet Take 1 tablet (5 mg total) by mouth 2 (two) times daily. 180 tablet 1  . b complex vitamins capsule Take 1 capsule by mouth 2 (two) times daily.    Marland Kitchen ezetimibe (ZETIA) 10 MG tablet TAKE 1 TABLET BY MOUTH DAILY for cholesterol. 90 tablet 3  . famotidine (PEPCID) 20 MG tablet TAKE 1 TABLET  BY MOUTH TWICE DAILY FOR HEARTBURN 180 tablet 1  . fluticasone (FLONASE) 50 MCG/ACT nasal spray Place 1 spray into both nostrils 2 (two) times daily. 16 g 0  . gentamicin ointment (GARAMYCIN) 0.1 % Apply 1 application topically.    Marland Kitchen glipiZIDE (GLUCOTROL XL) 10 MG 24 hr tablet Take 1 tablet (10 mg total) by mouth daily with breakfast. For diabetes. 90 tablet 1  . glucose blood (ACCU-CHEK GUIDE) test strip Use to test blood sugar up to 2 times daily 300 each 2  . hydrochlorothiazide (HYDRODIURIL) 25 MG tablet Take 1 tablet (25 mg total) by mouth daily. 90 tablet 0  . Iron-Vitamin C 65-125 MG TABS Take by mouth daily.     Marland Kitchen levothyroxine (SYNTHROID) 88 MCG tablet TAKE 1 TABLET BY MOUTH EVERY MORNING WITH WATER ONLY. NO FOOD OR MEDICATIONS FOR 30 MINUTES. GENERIC EQUIVALENT FOR SYNTHROID. 90 tablet 1  . Magnesium 500 MG CAPS Take 1 capsule by mouth 2 (two) times daily.    . metFORMIN (GLUCOPHAGE-XR) 500 MG 24 hr tablet Take 2 tablets (1,000 mg total) by mouth daily with breakfast. For diabetes. 180 tablet 3  . metoprolol tartrate (LOPRESSOR) 25 MG tablet Take 1 tablet (25 mg total) by mouth 2 (two) times daily. 180 tablet 3  . Multiple Vitamins-Minerals (MULTIVITAMIN ADULT PO) Take 1 capsule by mouth daily.     . propafenone (RYTHMOL SR) 225 MG 12 hr capsule Take 1 capsule (225 mg total)  by mouth 2 (two) times daily. 180 capsule 3  . quinapril (ACCUPRIL) 20 MG tablet TAKE 1 TABLET BY MOUTH AT BEDTIME for blood pressure. 90 tablet 3  . rosuvastatin (CRESTOR) 5 MG tablet TAKE 1 TABLET BY MOUTH DAILY GENERIC EQUIVALENT FOR CRESTOR 90 tablet 3  . venlafaxine XR (EFFEXOR-XR) 37.5 MG 24 hr capsule TAKE ONE CAPSULE BY MOUTH DAILY WITH BREAKFAST GENERIC EQUIVALENT FOR EFFEXOR XR 90 capsule 1  . verapamil (CALAN-SR) 240 MG CR tablet Take 1 tablet (240 mg total) by mouth daily. 90 tablet 2   Current Facility-Administered Medications on File Prior to Visit  Medication Dose Route Frequency Provider Last Rate Last Admin  . heparin lock flush 100 unit/mL  500 Units Intracatheter Once PRN Earlie Server, MD      . sodium chloride flush (NS) 0.9 % injection 10 mL  10 mL Intracatheter PRN Earlie Server, MD   10 mL at 10/06/17 1515    Temp 98 F (36.7 C) (Temporal)   Ht 5\' 6"  (1.676 m)   Wt 175 lb (79.4 kg)   BMI 28.25 kg/m    Objective:   Physical Exam Constitutional:      General: She is not in acute distress. Pulmonary:     Effort: Pulmonary effort is normal.  Skin:    General: Skin is dry.     Comments: 1 cm rounded open wound to left medial ankle. Whitish appearance inside. No significant surrounding erythema ro swelling. See attached picture under media tab.  Neurological:     Mental Status: She is alert.            Assessment & Plan:

## 2020-03-18 NOTE — Assessment & Plan Note (Signed)
Glucose readings in the 150-200 range over the last month or two. Will increase metformin to one extra tablet daily, 1500 mg daily. Continue glipizide XL 10 mg.   We will see her next month for follow up.

## 2020-03-20 ENCOUNTER — Ambulatory Visit: Payer: Managed Care, Other (non HMO) | Admitting: Primary Care

## 2020-03-23 ENCOUNTER — Other Ambulatory Visit: Payer: Self-pay | Admitting: Primary Care

## 2020-03-23 DIAGNOSIS — E039 Hypothyroidism, unspecified: Secondary | ICD-10-CM

## 2020-03-23 DIAGNOSIS — E1159 Type 2 diabetes mellitus with other circulatory complications: Secondary | ICD-10-CM

## 2020-03-23 DIAGNOSIS — K219 Gastro-esophageal reflux disease without esophagitis: Secondary | ICD-10-CM

## 2020-04-02 ENCOUNTER — Ambulatory Visit (INDEPENDENT_AMBULATORY_CARE_PROVIDER_SITE_OTHER): Payer: Managed Care, Other (non HMO) | Admitting: Primary Care

## 2020-04-02 ENCOUNTER — Encounter: Payer: Self-pay | Admitting: Primary Care

## 2020-04-02 ENCOUNTER — Other Ambulatory Visit: Payer: Self-pay

## 2020-04-02 VITALS — HR 69 | Temp 96.7°F | Wt 181.0 lb

## 2020-04-02 DIAGNOSIS — L97321 Non-pressure chronic ulcer of left ankle limited to breakdown of skin: Secondary | ICD-10-CM | POA: Insufficient documentation

## 2020-04-02 DIAGNOSIS — L03116 Cellulitis of left lower limb: Secondary | ICD-10-CM | POA: Diagnosis not present

## 2020-04-02 HISTORY — DX: Non-pressure chronic ulcer of left ankle limited to breakdown of skin: L97.321

## 2020-04-02 MED ORDER — SILVER SULFADIAZINE 1 % EX CREA: 1.0000 "application " | TOPICAL_CREAM | Freq: Every day | CUTANEOUS | 0 refills | Status: DC

## 2020-04-02 NOTE — Assessment & Plan Note (Signed)
Ulcer noted to left medial malleolus, no infection. Normal pedal pulses, no dependant rubor to lower extremities.   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   Will treat with silvadene cream, hydrogel liquid for protection. Discussed to avoid pressure to the site. We will see her in a few weeks for follow up.

## 2020-04-02 NOTE — Assessment & Plan Note (Signed)
Wound today is in fact not cellulitis but an ulcer.  See notes under ulcer.

## 2020-04-02 NOTE — Progress Notes (Signed)
Subjective:    Patient ID: Debra Perez, female    DOB: Jun 27, 1955, 65 y.o.   MRN: [id number]  HPI  This visit occurred during the SARS-CoV-2 public health emergency.  Safety protocols were in place, including screening questions prior to the visit, additional usage of staff PPE, and extensive cleaning of exam room while observing appropriate contact time as indicated for disinfecting solutions.   Debra Perez is a 65 year old female with a history of type 2 diabetes, paroxysmal atrial fibrillation, CAD, hypothyroidism, anemia, intermittent claudication who presents today for evaluation of a wound.  She was last evaluated on 03/18/20 via phone visit, she could not get her video camera to work, for a for a 2 week history of wound to the left ankle. No improvement with home care so we proceed with antibiotic treatment. She was treated with a cephalexin course.  She sent pictures of her wound.  Today she completed the entire course of cephalexin and hasn't noticed improvement. She denies the spot getting larger, but has not noticed healing. She site is painful, burns, intermittently. She denies fevers, no other rashes or ulcers, history of PAD, calf pain with ambulation, redness to the other portion of her extremity.   Review of Systems  Constitutional: Negative for fever.  Skin: Positive for color change and wound.     Past Medical History:  Diagnosis Date  . 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 Diabetes mellitus without complication ([place])   . Diverticulosis   . GERD (gastroesophageal reflux disease)   . History of colonic polyps    benign  . Hyperlipidemia   . Hypertension   . Hypothyroidism   . Lung infection   . Mitral valve prolapse   . NSVT (nonsustained ventricular tachycardia) ([place])   . Obstructive sleep apnea    on CPAP  . PAF (paroxysmal atrial fibrillation) ([place])    a.  On Eliquis; b. CHADS2VASc => 3 (HTN, DM, female)  . Sleep apnea      Social History    Socioeconomic History  . Marital status: Married    Spouse name: Not on file  . Number of children: Not on file  . Years of education: Not on file  . Highest education level: Not on file  Occupational History  . Occupation: retired  Tobacco Use  . Smoking status: Former Smoker    Packs/day: 1.00    Years: 20.00    Pack years: 20.00    Types: Cigarettes    Quit date: 03/07/2004    Years since quitting: 16.0  . Smokeless tobacco: Never Used  Vaping Use  . Vaping Use: Never used  Substance and Sexual Activity  . Alcohol use: No    Alcohol/week: 0.0 standard drinks  . Drug use: No  . Sexual activity: Not on file  Other Topics Concern  . Not on file  Social History Narrative   Married.   1 daughter.   Moved here from [place] due to husbands occupation.   She is a Corporate treasurer.   Enjoys sewing, gardening.   Social Determinants of Health   Financial Resource Strain: Not on file  Food Insecurity: Not on file  Transportation Needs: Not on file  Physical Activity: Not on file  Stress: Not on file  Social Connections: Not on file  Intimate Partner Violence: Not on file    Past Surgical History:  Procedure Laterality Date  . ABDOMINAL HYSTERECTOMY  2002   ovaries remain  .  CLAVICLE SURGERY  2007   right clavicle plate pins  . COLONOSCOPY WITH PROPOFOL N/A 10/17/2017   Procedure: COLONOSCOPY WITH PROPOFOL;  Surgeon: Jonathon Bellows, MD;  Location: [facility] ENDOSCOPY;  Service: Gastroenterology;  Laterality: N/A;  . ESOPHAGOGASTRODUODENOSCOPY (EGD) WITH PROPOFOL N/A 10/17/2017   Procedure: ESOPHAGOGASTRODUODENOSCOPY (EGD) WITH PROPOFOL;  Surgeon: Jonathon Bellows, MD;  Location: [facility] ENDOSCOPY;  Service: Gastroenterology;  Laterality: N/A;  . GIVENS CAPSULE STUDY N/A 11/30/2017   Procedure: GIVENS CAPSULE STUDY;  Surgeon: Jonathon Bellows, MD;  Location: [facility] ENDOSCOPY;  Service: Gastroenterology;  Laterality: N/A;  . KNEE ARTHROSCOPY  2012    Family History  Problem Relation Age of Onset  .  Depression Mother   . Sudden death Mother 53  . Heart attack Mother   . Arthritis Father   . Heart disease Father   . Hypertension Father   . Emphysema Father   . Bladder Cancer Sister   . Diabetes Sister        Type 2  . Diabetes Brother        type  . Hypertension Brother   . Emphysema Brother   . Heart disease Brother   . Diabetes Maternal Grandfather   . Bladder Cancer Maternal Grandmother        sinus cancer   . Breast cancer Paternal Grandmother   . Cancer Paternal Grandfather        colon cancer  . Diabetes Paternal Grandfather     No Known Allergies  Current Outpatient Medications on File Prior to Visit  Medication Sig Dispense Refill  . Accu-Chek FastClix Lancets MISC Use as instructed to test blood sugar 2 times daily 300 each 2  . [name] 50 MG tablet TAKE 1 TABLET BY MOUTH AT BEDTIME 90 tablet 1  . apixaban (ELIQUIS) 5 MG TABS tablet Take 1 tablet (5 mg total) by mouth 2 (two) times daily. 180 tablet 1  . b complex vitamins capsule Take 1 capsule by mouth 2 (two) times daily.    Marland Kitchen ezetimibe (ZETIA) 10 MG tablet TAKE 1 TABLET BY MOUTH DAILY for cholesterol. 90 tablet 3  . famotidine (PEPCID) 20 MG tablet TAKE 1 TABLET BY MOUTH TWICE DAILY FOR HEARTBURN 180 tablet 0  . fluticasone (FLONASE) 50 MCG/ACT nasal spray Place 1 spray into both nostrils 2 (two) times daily. 16 g 0  . gentamicin ointment (GARAMYCIN) 0.1 % Apply 1 application topically.    Marland Kitchen glipiZIDE (GLUCOTROL XL) 10 MG 24 hr tablet TAKE 1 TABLET(10 MG) BY MOUTH DAILY WITH BREAKFAST FOR DIABETES 90 tablet 0  . glucose blood (ACCU-CHEK GUIDE) test strip Use to test blood sugar up to 2 times daily 300 each 2  . hydrochlorothiazide (HYDRODIURIL) 25 MG tablet Take 1 tablet (25 mg total) by mouth daily. 90 tablet 0  . Iron-Vitamin C 65-125 MG TABS Take by mouth daily.     Marland Kitchen levothyroxine (SYNTHROID) 88 MCG tablet TAKE 1 TABLET BY MOUTH EVERY MORNING WITH WATER ONLY, NO FOOD OR MEDICATIONS FOR 30  MINUTES 90 tablet 0  . Magnesium 500 MG CAPS Take 1 capsule by mouth 2 (two) times daily.    . metFORMIN (GLUCOPHAGE-XR) 500 MG 24 hr tablet Take 2 tablets (1,000 mg total) by mouth daily with breakfast. For diabetes. 180 tablet 3  . metoprolol tartrate (LOPRESSOR) 25 MG tablet Take 1 tablet (25 mg total) by mouth 2 (two) times daily. 180 tablet 3  . Multiple Vitamins-Minerals (MULTIVITAMIN ADULT PO) Take 1 capsule by mouth daily.     Marland Kitchen  propafenone (RYTHMOL SR) 225 MG 12 hr capsule Take 1 capsule (225 mg total) by mouth 2 (two) times daily. 180 capsule 3  . quinapril (ACCUPRIL) 20 MG tablet TAKE 1 TABLET BY MOUTH AT BEDTIME for blood pressure. 90 tablet 3  . rosuvastatin (CRESTOR) 5 MG tablet TAKE 1 TABLET BY MOUTH DAILY GENERIC EQUIVALENT FOR CRESTOR 90 tablet 3  . venlafaxine XR (EFFEXOR-XR) 37.5 MG 24 hr capsule TAKE ONE CAPSULE BY MOUTH DAILY WITH BREAKFAST GENERIC EQUIVALENT FOR EFFEXOR XR 90 capsule 1  . verapamil (CALAN-SR) 240 MG CR tablet Take 1 tablet (240 mg total) by mouth daily. 90 tablet 2   Current Facility-Administered Medications on File Prior to Visit  Medication Dose Route Frequency Provider Last Rate Last Admin  . heparin lock flush 100 unit/mL  500 Units Intracatheter Once PRN Earlie Server, MD      . sodium chloride flush (NS) 0.9 % injection 10 mL  10 mL Intracatheter PRN Earlie Server, MD   10 mL at 10/06/17 1515    Pulse 69   Temp (!) 96.7 F (35.9 C)   Wt 181 lb (82.1 kg)   SpO2 95%   BMI 29.21 kg/m    Objective:   Physical Exam Pulmonary:     Effort: Pulmonary effort is normal.  Skin:    General: Skin is warm and dry.     Comments: 0.75 cm rounded ulcer with white center to left medial malleolus region. No surrounding erythema, no drainage.   Neurological:     Mental Status: She is alert.            Assessment & Plan:

## 2020-04-02 NOTE — Patient Instructions (Addendum)
Continue to monitor the site for increased pain, redness, increased size. Try to limit pressure to the site.  You can try Hydrogel liquid for protection.  It was a pleasure to see you today!

## 2020-04-13 ENCOUNTER — Ambulatory Visit (INDEPENDENT_AMBULATORY_CARE_PROVIDER_SITE_OTHER): Payer: Managed Care, Other (non HMO) | Admitting: Primary Care

## 2020-04-13 ENCOUNTER — Encounter: Payer: Self-pay | Admitting: Primary Care

## 2020-04-13 ENCOUNTER — Other Ambulatory Visit: Payer: Self-pay

## 2020-04-13 VITALS — BP 124/72 | HR 77 | Temp 97.2°F | Ht 66.0 in | Wt 181.0 lb

## 2020-04-13 DIAGNOSIS — L97321 Non-pressure chronic ulcer of left ankle limited to breakdown of skin: Secondary | ICD-10-CM | POA: Diagnosis not present

## 2020-04-13 DIAGNOSIS — M5441 Lumbago with sciatica, right side: Secondary | ICD-10-CM | POA: Diagnosis not present

## 2020-04-13 DIAGNOSIS — E1159 Type 2 diabetes mellitus with other circulatory complications: Secondary | ICD-10-CM

## 2020-04-13 DIAGNOSIS — M542 Cervicalgia: Secondary | ICD-10-CM | POA: Diagnosis not present

## 2020-04-13 DIAGNOSIS — G8929 Other chronic pain: Secondary | ICD-10-CM | POA: Insufficient documentation

## 2020-04-13 LAB — POCT GLYCOSYLATED HEMOGLOBIN (HGB A1C): Hemoglobin A1C: 7.6 % — AB (ref 4.0–5.6)

## 2020-04-13 MED ORDER — METFORMIN HCL ER 500 MG PO TB24
ORAL_TABLET | ORAL | 3 refills | Status: DC
Start: 2020-04-13 — End: 2020-11-26

## 2020-04-13 MED ORDER — CYCLOBENZAPRINE HCL 5 MG PO TABS: 5.0000 mg | ORAL_TABLET | Freq: Three times a day (TID) | ORAL | 0 refills | Status: DC | PRN

## 2020-04-13 NOTE — Patient Instructions (Signed)
Start exercising. You should be getting 150 minutes of moderate intensity exercise weekly.  It's important to improve your diet by reducing consumption of fast food, fried food, processed snack foods, sugary drinks. Increase consumption of fresh vegetables and fruits, whole grains, water.  Ensure you are drinking 64 ounces of water daily.  Continue Metformin XR 500 mg twice daily. Continue Glipizide XL 10 mg daily.  Please schedule a follow up appointment in 6 months for your annual physical.   It was a pleasure to see you today!

## 2020-04-13 NOTE — Assessment & Plan Note (Signed)
Intermittent, using cyclobenzaprine sparingly. Updated Rx today. 

## 2020-04-13 NOTE — Assessment & Plan Note (Signed)
Worse than last visit at 7.6, but she does seem to be responding to the added dose of Metformin XR 500 mg HS.   Continue Metformin XR 500 mg in AM and 1000 mg in PM. Continue Glipizide XL 10 mg daily.  Managed on statin and ACE Pneumonia vaccine UTD. Foot exam today/ Eye exam UTD.   Follow up in 6 months.

## 2020-04-13 NOTE — Progress Notes (Signed)
Subjective:    Patient ID: Debra Perez, female    DOB: [birth year], 65 y.o.   MRN: [id number]  HPI  This visit occurred during the SARS-CoV-2 public health emergency.  Safety protocols were in place, including screening questions prior to the visit, additional usage of staff PPE, and extensive cleaning of exam room while observing appropriate contact time as indicated for disinfecting solutions.   Debra Perez is a 65 year old female with a history of hypertension, atrial fibrillation, CAD, type 2 diabetes, hypothyroidism, hyperlipidemia who presents today for follow up of diabetes. She would also like a refill of her cyclobenzaprine.  1) Type 2 Diabetes:  Current medications include: Glipizide 10 XL, Metformin XR 500 1 tablet in AM and 2 tablets in PM. We increased her Metformin dose about one month ago in response to increased glucose readings.   She is checking her blood glucose infrequently times daily and is getting readings of:  120-160's  Last A1C: 7.0 in August 2021, 7.6 today Last Eye Exam: UTD Last Foot Exam: Due Pneumonia Vaccination: UTD ACE/ARB: ACE-I Statin: Zetia, rosuvastatin   BP Readings from Last 3 Encounters:  04/13/20 124/72  01/20/20 124/72  10/10/19 122/74   2) Chronic Neck and Back Pain: Intermittent, flares typically during winter weather. She uses cyclobenzaprine sparingly if needed for increased pain. Her last refill was in 2019, she is needing a refill today.   Review of Systems  Eyes: Negative for visual disturbance.  Respiratory: Negative for shortness of breath.   Cardiovascular: Negative for chest pain.  Skin:       Healing ulcer to left medial ankle  Neurological: Negative for dizziness and numbness.       Past Medical History:  Diagnosis Date  . 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 Diabetes mellitus without complication ([place])   . Diverticulosis   . GERD (gastroesophageal reflux disease)   . History of colonic polyps     benign  . Hyperlipidemia   . Hypertension   . Hypothyroidism   . Lung infection   . Mitral valve prolapse   . NSVT (nonsustained ventricular tachycardia) ([place])   . Obstructive sleep apnea    on CPAP  . PAF (paroxysmal atrial fibrillation) ([place])    a.  On Eliquis; b. CHADS2VASc => 3 (HTN, DM, female)  . Sleep apnea      Social History   Socioeconomic History  . Marital status: Married    Spouse name: Not on file  . Number of children: Not on file  . Years of education: Not on file  . Highest education level: Not on file  Occupational History  . Occupation: retired  Tobacco Use  . Smoking status: Former Smoker    Packs/day: 1.00    Years: 20.00    Pack years: 20.00    Types: Cigarettes    Quit date: 03/07/2004    Years since quitting: 16.1  . Smokeless tobacco: Never Used  Vaping Use  . Vaping Use: Never used  Substance and Sexual Activity  . Alcohol use: No    Alcohol/week: 0.0 standard drinks  . Drug use: No  . Sexual activity: Not on file  Other Topics Concern  . Not on file  Social History Narrative   Married.   1 daughter.   Moved here from [place] due to husbands occupation.   She is a Corporate treasurer.   Enjoys sewing, gardening.   Social Determinants of Radio broadcast assistant  Strain: Not on file  Food Insecurity: Not on file  Transportation Needs: Not on file  Physical Activity: Not on file  Stress: Not on file  Social Connections: Not on file  Intimate Partner Violence: Not on file    Past Surgical History:  Procedure Laterality Date  . ABDOMINAL HYSTERECTOMY  2002   ovaries remain  . CLAVICLE SURGERY  2007   right clavicle plate pins  . COLONOSCOPY WITH PROPOFOL N/A 10/17/2017   Procedure: COLONOSCOPY WITH PROPOFOL;  Surgeon: Jonathon Bellows, MD;  Location: [facility] ENDOSCOPY;  Service: Gastroenterology;  Laterality: N/A;  . ESOPHAGOGASTRODUODENOSCOPY (EGD) WITH PROPOFOL N/A 10/17/2017   Procedure: ESOPHAGOGASTRODUODENOSCOPY (EGD) WITH PROPOFOL;  Surgeon:  Jonathon Bellows, MD;  Location: [facility] ENDOSCOPY;  Service: Gastroenterology;  Laterality: N/A;  . GIVENS CAPSULE STUDY N/A 11/30/2017   Procedure: GIVENS CAPSULE STUDY;  Surgeon: Jonathon Bellows, MD;  Location[facility] ENDOSCOPY;  Service: Gastroenterology;  Laterality: N/A;  . KNEE ARTHROSCOPY  2012    Family History  Problem Relation Age of Onset  . Depression Mother   . Sudden death Mother 81  . Heart attack Mother   . Arthritis Father   . Heart disease Father   . Hypertension Father   . Emphysema Father   . Bladder Cancer Sister   . Diabetes Sister        Type 2  . Diabetes Brother        type  . Hypertension Brother   . Emphysema Brother   . Heart disease Brother   . Diabetes Maternal Grandfather   . Bladder Cancer Maternal Grandmother        sinus cancer   . Breast cancer Paternal Grandmother   . Cancer Paternal Grandfather        colon cancer  . Diabetes Paternal Grandfather     No Known Allergies  Current Outpatient Medications on File Prior to Visit  Medication Sig Dispense Refill  . Accu-Chek FastClix Lancets MISC Use as instructed to test blood sugar 2 times daily 300 each 2  . [name] 50 MG tablet TAKE 1 TABLET BY MOUTH AT BEDTIME 90 tablet 1  . apixaban (ELIQUIS) 5 MG TABS tablet Take 1 tablet (5 mg total) by mouth 2 (two) times daily. 180 tablet 1  . b complex vitamins capsule Take 1 capsule by mouth 2 (two) times daily.    . famotidine (PEPCID) 20 MG tablet TAKE 1 TABLET BY MOUTH TWICE DAILY FOR HEARTBURN 180 tablet 0  . fluticasone (FLONASE) 50 MCG/ACT nasal spray Place 1 spray into both nostrils 2 (two) times daily. 16 g 0  . gentamicin ointment (GARAMYCIN) 0.1 % Apply 1 application topically.    Marland Kitchen glipiZIDE (GLUCOTROL XL) 10 MG 24 hr tablet TAKE 1 TABLET(10 MG) BY MOUTH DAILY WITH BREAKFAST FOR DIABETES 90 tablet 0  . glucose blood (ACCU-CHEK GUIDE) test strip Use to test blood sugar up to 2 times daily 300 each 2  . hydrochlorothiazide (HYDRODIURIL)  25 MG tablet Take 1 tablet (25 mg total) by mouth daily. 90 tablet 0  . Iron-Vitamin C 65-125 MG TABS Take by mouth daily.     Marland Kitchen levothyroxine (SYNTHROID) 88 MCG tablet TAKE 1 TABLET BY MOUTH EVERY MORNING WITH WATER ONLY, NO FOOD OR MEDICATIONS FOR 30 MINUTES 90 tablet 0  . Magnesium 500 MG CAPS Take 1 capsule by mouth 2 (two) times daily.    . metoprolol tartrate (LOPRESSOR) 25 MG tablet Take 1 tablet (25 mg total) by mouth 2 (two)  times daily. 180 tablet 3  . Multiple Vitamins-Minerals (MULTIVITAMIN ADULT PO) Take 1 capsule by mouth daily.     . propafenone (RYTHMOL SR) 225 MG 12 hr capsule Take 1 capsule (225 mg total) by mouth 2 (two) times daily. 180 capsule 3  . quinapril (ACCUPRIL) 20 MG tablet TAKE 1 TABLET BY MOUTH AT BEDTIME for blood pressure. 90 tablet 3  . rosuvastatin (CRESTOR) 5 MG tablet TAKE 1 TABLET BY MOUTH DAILY GENERIC EQUIVALENT FOR CRESTOR 90 tablet 3  . silver sulfADIAZINE (SILVADENE) 1 % cream Apply 1 application topically daily. 25 g 0  . venlafaxine XR (EFFEXOR-XR) 37.5 MG 24 hr capsule TAKE ONE CAPSULE BY MOUTH DAILY WITH BREAKFAST GENERIC EQUIVALENT FOR EFFEXOR XR 90 capsule 1  . verapamil (CALAN-SR) 240 MG CR tablet Take 1 tablet (240 mg total) by mouth daily. 90 tablet 2  . ezetimibe (ZETIA) 10 MG tablet TAKE 1 TABLET BY MOUTH DAILY for cholesterol. (Patient not taking: Reported on 04/13/2020) 90 tablet 3   Current Facility-Administered Medications on File Prior to Visit  Medication Dose Route Frequency Provider Last Rate Last Admin  . heparin lock flush 100 unit/mL  500 Units Intracatheter Once PRN Earlie Server, MD      . sodium chloride flush (NS) 0.9 % injection 10 mL  10 mL Intracatheter PRN Earlie Server, MD   10 mL at 10/06/17 1515    BP 124/72   Pulse 77   Temp (!) 97.2 F (36.2 C) (Temporal)   Ht 5\' 6"  (1.676 m)   Wt 181 lb (82.1 kg)   SpO2 100%   BMI 29.21 kg/m    Objective:   Physical Exam Constitutional:      Appearance: She is well-nourished.   Cardiovascular:     Rate and Rhythm: Normal rate and regular rhythm.  Pulmonary:     Effort: Pulmonary effort is normal.     Breath sounds: Normal breath sounds.  Musculoskeletal:     Cervical back: Neck supple.  Skin:    General: Skin is warm and dry.     Comments: Healing ulcer to left medial ankle.  Psychiatric:        Mood and Affect: Mood and affect normal.            Assessment & Plan:

## 2020-04-13 NOTE — Assessment & Plan Note (Signed)
Intermittent, using cyclobenzaprine sparingly. Updated Rx today.

## 2020-04-13 NOTE — Assessment & Plan Note (Signed)
Not using silvadene. Improving. Continue to monitor.

## 2020-04-16 ENCOUNTER — Other Ambulatory Visit: Payer: Self-pay | Admitting: Primary Care

## 2020-04-16 ENCOUNTER — Other Ambulatory Visit: Payer: Self-pay | Admitting: Physician Assistant

## 2020-04-16 DIAGNOSIS — E782 Mixed hyperlipidemia: Secondary | ICD-10-CM

## 2020-04-16 DIAGNOSIS — E1159 Type 2 diabetes mellitus with other circulatory complications: Secondary | ICD-10-CM

## 2020-04-16 DIAGNOSIS — R232 Flushing: Secondary | ICD-10-CM

## 2020-04-27 ENCOUNTER — Telehealth: Payer: Self-pay

## 2020-04-27 NOTE — Telephone Encounter (Signed)
Dineen Powell [facility] - Rx #: [id number] Need help? Call us at [phone] Status Sent to [facility] Accu-Chek Guide strips Form [employer] [facility] [place] [facility]

## 2020-05-01 ENCOUNTER — Other Ambulatory Visit: Payer: Self-pay

## 2020-05-01 MED ORDER — BLOOD GLUCOSE METER KIT: PACK | 0 refills | Status: DC

## 2020-05-01 NOTE — Telephone Encounter (Signed)
I have received fax states that not covered that brand. Called patient and let them know I will send script for new supplies. She has asked to fax to mail order. Will send when I get back to my desk.

## 2020-05-05 ENCOUNTER — Other Ambulatory Visit: Payer: Self-pay

## 2020-05-05 MED ORDER — MICROLET LANCETS MISC: 1.0000 | 3 refills | Status: DC | PRN

## 2020-06-01 ENCOUNTER — Telehealth: Payer: Self-pay | Admitting: Cardiovascular Disease

## 2020-06-01 MED ORDER — HYDROCHLOROTHIAZIDE 25 MG PO TABS: 25.0000 mg | ORAL_TABLET | Freq: Every day | ORAL | 0 refills | Status: DC

## 2020-06-01 NOTE — Telephone Encounter (Signed)
Received fax from [facility] requesting refills for HCTZ 25 mg. Rx request sent to pharmacy.

## 2020-06-16 ENCOUNTER — Other Ambulatory Visit: Payer: Self-pay

## 2020-06-16 ENCOUNTER — Other Ambulatory Visit: Payer: Self-pay | Admitting: Primary Care

## 2020-06-16 DIAGNOSIS — K219 Gastro-esophageal reflux disease without esophagitis: Secondary | ICD-10-CM

## 2020-06-16 DIAGNOSIS — E039 Hypothyroidism, unspecified: Secondary | ICD-10-CM

## 2020-06-16 DIAGNOSIS — E1159 Type 2 diabetes mellitus with other circulatory complications: Secondary | ICD-10-CM

## 2020-06-16 MED ORDER — VERAPAMIL HCL ER 240 MG PO TBCR: 240.0000 mg | EXTENDED_RELEASE_TABLET | Freq: Every day | ORAL | 3 refills | Status: DC

## 2020-06-22 NOTE — Progress Notes (Signed)
Patient ID: Debra Perez, female   DOB: 1955/10/10, 65 y.o.   MRN: [id number] Cardiology Office Note  Date:  06/23/2020   ID:  Debra Perez, DOB December 26, 1955, MRN [id number]  PCP:  Pleas [name], NP   Chief Complaint  Patient presents with  . Annual Exam    Pt states she is doing well, having a hard time losing weight    HPI:  Debra Perez is a pleasant 65 year old woman with history of  Diabetes II Paroxysmal atrial fibrillation, ChadsVASC 4 to 5 nonsustained VT/palpitations,  normal ejection fraction with EF greater than 60%,  mild three-vessel coronary artery disease on CTA scan,  20 years of smoking who stopped 13 years ago,  Upper respiratory infection February 2016  atrial fibrillation after inhaling albuterol while on prednisone,  evaluated in the hospital at that time with arrhythmia lasting for hours before converting back to normal sinus rhythm,  History of Obstructive sleep apnea, on CPAP Who presents for follow-up of her atrial fibrillation  LOV 06/2019 eliquis expensive, Would like to stop Despite elevated chads Vasc, she feels that as she is only had 1 documented episode years ago, it would be acceptable risk to stop and she would like to hold the Eliquis  Reports that if she misses a dose of her medications, she does feel some palpitations but does not feel it is atrial fibrillation  Weight up, no regular exercise program  Labs: Reviewed on today's visit A1C 7.6 Total chol 120, LDL 49  Tolerating Crestor, zetia  EKG personally reviewed by myself on todays visit Shows No sinus rhythm rate 61 bpm nonspecific ST abnormality right bundle branch block, no change  Other past medical history reviewed CT scan done in 2016 again reviewed with him showing mild coronary artery disease calcification   atrial fibrillation 01/16/2015 lasting at least 5 hours  metoprolol held for fatigue  multaq held for hair loss  started Rythmol 225 mg twice a day, SR dose,  and on this regiment has felt better   Cardiac CTA with mild three-vessel CAD, ectatic ascending aorta measuring 3.72 cm  PFTs with mild obstructive lung disease   PMH:   has a past medical history of Carotid artery occlusion, Diabetes mellitus without complication ([place]), Diverticulosis, GERD (gastroesophageal reflux disease), History of colonic polyps, Hyperlipidemia, Hypertension, Hypothyroidism, Lung infection, Mitral valve prolapse, NSVT (nonsustained ventricular tachycardia) ([place]), Obstructive sleep apnea, PAF (paroxysmal atrial fibrillation) ([place]), and Sleep apnea.  PSH:    Past Surgical History:  Procedure Laterality Date  . ABDOMINAL HYSTERECTOMY  2002   ovaries remain  . CLAVICLE SURGERY  2007   right clavicle plate pins  . COLONOSCOPY WITH PROPOFOL N/A 10/17/2017   Procedure: COLONOSCOPY WITH PROPOFOL;  Surgeon: Jonathon Bellows, MD;  Location: [facility] ENDOSCOPY;  Service: Gastroenterology;  Laterality: N/A;  . ESOPHAGOGASTRODUODENOSCOPY (EGD) WITH PROPOFOL N/A 10/17/2017   Procedure: ESOPHAGOGASTRODUODENOSCOPY (EGD) WITH PROPOFOL;  Surgeon: Jonathon Bellows, MD;  Location: [facility] ENDOSCOPY;  Service: Gastroenterology;  Laterality: N/A;  . GIVENS CAPSULE STUDY N/A 11/30/2017   Procedure: GIVENS CAPSULE STUDY;  Surgeon: Jonathon Bellows, MD;  Location: [facility] ENDOSCOPY;  Service: Gastroenterology;  Laterality: N/A;  . KNEE ARTHROSCOPY  2012    Current Outpatient Medications  Medication Sig Dispense Refill  . [name] 50 MG tablet TAKE 1 TABLET BY MOUTH AT BEDTIME 90 tablet 1  . b complex vitamins capsule Take 1 capsule by mouth 2 (two) times daily.    . blood glucose meter kit and supplies Dispense based  on patient and insurance preference. Use up to four times daily as directed. 1 each 0  . cyclobenzaprine (FLEXERIL) 5 MG tablet Take 1 tablet (5 mg total) by mouth 3 (three) times daily as needed for muscle spasms. 30 tablet 0  . ezetimibe (ZETIA) 10 MG tablet TAKE 1 TABLET BY MOUTH  DAILY GENERIC EQUIVALENT FOR ZETIA 90 tablet 0  . famotidine (PEPCID) 20 MG tablet TAKE 1 TABLET BY MOUTH TWICE DAILY FOR HEARTBURN 180 tablet 3  . fluticasone (FLONASE) 50 MCG/ACT nasal spray Place 1 spray into both nostrils 2 (two) times daily. 16 g 0  . gentamicin ointment (GARAMYCIN) 0.1 % Apply 1 application topically.    Marland Kitchen glipiZIDE (GLUCOTROL XL) 10 MG 24 hr tablet Take 1 tablet (10 mg total) by mouth daily with breakfast. For diabetes. 90 tablet 3  . hydrochlorothiazide (HYDRODIURIL) 25 MG tablet Take 1 tablet (25 mg total) by mouth daily. 90 tablet 0  . Iron-Vitamin C 65-125 MG TABS Take by mouth daily.     Marland Kitchen levothyroxine (SYNTHROID) 88 MCG tablet TAKE 1 TABLET BY MOUTH EVERY MORNING WITH WATER ONLY, NO FOOD OR MEDICATIONS FOR 30 MINUTES 90 tablet 1  . Magnesium 500 MG CAPS Take 1 capsule by mouth 2 (two) times daily.    . metFORMIN (GLUCOPHAGE-XR) 500 MG 24 hr tablet Take 1 tablet in the morning and 2 tablets in the evening. For diabetes. 270 tablet 3  . metoprolol succinate (TOPROL-XL) 25 MG 24 hr tablet Take 1 tablet (25 mg total) by mouth daily. Take with or immediately following a meal. 90 tablet 3  . Microlet Lancets MISC 1 each by Does not apply route as needed. 200 each 3  . Multiple Vitamins-Minerals (MULTIVITAMIN ADULT PO) Take 1 capsule by mouth daily.     . propafenone (RYTHMOL SR) 225 MG 12 hr capsule Take 1 capsule (225 mg total) by mouth 2 (two) times daily. 180 capsule 3  . quinapril (ACCUPRIL) 20 MG tablet TAKE 1 TABLET BY MOUTH AT BEDTIME for blood pressure. 90 tablet 3  . rosuvastatin (CRESTOR) 5 MG tablet TAKE 1 TABLET BY MOUTH DAILY GENERIC EQUIVALENT FOR CRESTOR 90 tablet 3  . silver sulfADIAZINE (SILVADENE) 1 % cream Apply 1 application topically daily. 25 g 0  . venlafaxine XR (EFFEXOR-XR) 37.5 MG 24 hr capsule TAKE 1 CAPSULE(37.5MG TOTAL) BY MOUTH DAILY WITH BREAKFAST for hot flashes. 90 capsule 3  . verapamil (CALAN-SR) 240 MG CR tablet Take 1 tablet (240 mg  total) by mouth daily. 90 tablet 3   No current facility-administered medications for this visit.   Facility-Administered Medications Ordered in Other Visits  Medication Dose Route Frequency Provider Last Rate Last Admin  . heparin lock flush 100 unit/mL  500 Units Intracatheter Once PRN Earlie Server, MD      . sodium chloride flush (NS) 0.9 % injection 10 mL  10 mL Intracatheter PRN Earlie Server, MD   10 mL at 10/06/17 1515     Allergies:   Patient has no known allergies.   Social History:  The patient  reports that she quit smoking about 16 years ago. Her smoking use included cigarettes. She has a 20.00 pack-year smoking history. She has never used smokeless tobacco. She reports that she does not drink alcohol and does not use drugs.   Family History:   family history includes Arthritis in her father; Bladder Cancer in her maternal grandmother and sister; Breast cancer in her paternal grandmother; Cancer in her paternal grandfather;  Depression in her mother; Diabetes in her brother, maternal grandfather, paternal grandfather, and sister; Emphysema in her brother and father; Heart attack in her mother; Heart disease in her brother and father; Hypertension in her brother and father; Sudden death (age of onset: 83) in her mother.    Review of Systems: Review of Systems  Constitutional: Negative.   Respiratory: Negative.   Cardiovascular: Positive for palpitations.  Gastrointestinal: Negative.   Musculoskeletal: Negative.   Neurological: Negative.   Psychiatric/Behavioral: Negative.   All other systems reviewed and are negative.    PHYSICAL EXAM: VS:  BP 124/68   Pulse 60   Ht '5\' 6"'  (1.676 m)   Wt 182 lb (82.6 kg)   BMI 29.38 kg/m  , BMI Body mass index is 29.38 kg/m. Constitutional:  oriented to person, place, and time. No distress.  HENT:  Head: Grossly normal Eyes:  no discharge. No scleral icterus.  Neck: No JVD, no carotid bruits  Cardiovascular: Regular rate and rhythm, no  murmurs appreciated Pulmonary/Chest: Clear to auscultation bilaterally, no wheezes or rails Abdominal: Soft.  no distension.  no tenderness.  Musculoskeletal: Normal range of motion Neurological:  normal muscle tone. Coordination normal. No atrophy Skin: Skin warm and dry Psychiatric: normal affect, pleasant  Recent Labs: 10/07/2019: ALT 17; BUN 17; Creatinine, Ser 0.87; Hemoglobin 13.9; Platelets 326.0; Potassium 4.6; Sodium 135; TSH 1.62    Lipid Panel Lab Results  Component Value Date   CHOL 120 10/07/2019   HDL 46.30 10/07/2019   LDLCALC 49 10/07/2019   TRIG 119.0 10/07/2019      Wt Readings from Last 3 Encounters:  06/23/20 182 lb (82.6 kg)  04/13/20 181 lb (82.1 kg)  04/02/20 181 lb (82.1 kg)     ASSESSMENT AND PLAN:   Paroxysmal atrial fibrillation ([place]) - Plan: EKG 12-Lead Reports that she is tolerating Rythmol and Eliquis, metoprol, verapamil Risk and benefit of anticoagulation discussed with her, she would like to stop the Eliquis Other medications will be continued  Essential hypertension -  Blood pressure is well controlled on today's visit. No changes made to the medications.  NSVT (nonsustained ventricular tachycardia) (HCC) Continue beta-blocker, verapamil Denies any near-syncope or syncope  Hyperlipidemia Coronary calcifications seen on previous CT scan chest Cholesterol at goal, recommend she stay on her statin with Zetia  Type 2 diabetes mellitus with other circulatory complication, without long-term current use of insulin (HCC) Weight running high, recommended regular walking program, dietary restriction     Total encounter time more than 25 minutes  Greater than 50% was spent in counseling and coordination of care with the patient     Orders Placed This Encounter  Procedures  . EKG 12-Lead     Signed, Esmond Plants, M.D., Ph.D. 06/23/2020  [place], [place]

## 2020-06-23 ENCOUNTER — Other Ambulatory Visit: Payer: Self-pay

## 2020-06-23 ENCOUNTER — Encounter: Payer: Self-pay | Admitting: Cardiovascular Disease

## 2020-06-23 ENCOUNTER — Ambulatory Visit (INDEPENDENT_AMBULATORY_CARE_PROVIDER_SITE_OTHER): Payer: Managed Care, Other (non HMO) | Admitting: Cardiovascular Disease

## 2020-06-23 VITALS — BP 124/68 | HR 60 | Ht 66.0 in | Wt 182.0 lb

## 2020-06-23 DIAGNOSIS — I77819 Aortic ectasia, unspecified site: Secondary | ICD-10-CM

## 2020-06-23 DIAGNOSIS — I471 Supraventricular tachycardia, unspecified: Secondary | ICD-10-CM

## 2020-06-23 DIAGNOSIS — I2510 Atherosclerotic heart disease of native coronary artery without angina pectoris: Secondary | ICD-10-CM

## 2020-06-23 DIAGNOSIS — I480 Paroxysmal atrial fibrillation: Secondary | ICD-10-CM | POA: Diagnosis not present

## 2020-06-23 DIAGNOSIS — I700 Atherosclerosis of aorta: Secondary | ICD-10-CM | POA: Diagnosis not present

## 2020-06-23 DIAGNOSIS — E782 Mixed hyperlipidemia: Secondary | ICD-10-CM

## 2020-06-23 DIAGNOSIS — I10 Essential (primary) hypertension: Secondary | ICD-10-CM

## 2020-06-23 MED ORDER — METOPROLOL SUCCINATE ER 25 MG PO TB24: 25.0000 mg | ORAL_TABLET | Freq: Every day | ORAL | 3 refills | Status: DC

## 2020-06-23 NOTE — Patient Instructions (Addendum)
Medication Instructions:  STOP metoprolol tartrate  Change to metoprolol succinate 25 daily  STOP eliquis  If you need a refill on your cardiac medications before your next appointment, please call your pharmacy.    Lab work: No new labs needed  Testing/Procedures: No new testing needed   Follow-Up:  . You will need a follow up appointment in 12 months  . Providers on your designated Care Team:   . Murray Hodgkins, NP . Christell Faith, PA-C . Marrianne Mood, PA-C    COVID-19 Vaccine Information can be found at: [email or web address].uk For questions related to vaccine distribution or appointments, please email vaccine@ [email or web address] or call [phone].

## 2020-06-25 DIAGNOSIS — G4733 Obstructive sleep apnea (adult) (pediatric): Secondary | ICD-10-CM

## 2020-06-30 ENCOUNTER — Ambulatory Visit (INDEPENDENT_AMBULATORY_CARE_PROVIDER_SITE_OTHER): Payer: Managed Care, Other (non HMO)

## 2020-06-30 ENCOUNTER — Ambulatory Visit (INDEPENDENT_AMBULATORY_CARE_PROVIDER_SITE_OTHER): Payer: Managed Care, Other (non HMO) | Admitting: Podiatry

## 2020-06-30 ENCOUNTER — Ambulatory Visit: Payer: Managed Care, Other (non HMO) | Admitting: Podiatry

## 2020-06-30 ENCOUNTER — Other Ambulatory Visit: Payer: Self-pay

## 2020-06-30 DIAGNOSIS — M778 Other enthesopathies, not elsewhere classified: Secondary | ICD-10-CM | POA: Diagnosis not present

## 2020-06-30 DIAGNOSIS — M19071 Primary osteoarthritis, right ankle and foot: Secondary | ICD-10-CM

## 2020-06-30 MED ORDER — BETAMETHASONE SOD PHOS & ACET 6 (3-3) MG/ML IJ SUSP
3.0000 mg | Freq: Once | INTRAMUSCULAR | Status: AC
  Administered 2020-06-30: 3 mg via INTRA_ARTICULAR

## 2020-06-30 NOTE — Progress Notes (Signed)
   HPI: 65 y.o. female presenting today for new complaint regarding evaluation of right foot pain is been going on for the last 2-3 months.  She states that she developed some pain to the dorsal aspect of the right foot.  She denies a history of injury.  She cannot recall any incident or shoe gear that would have aggravated her foot.  She does states that she has a history of arthritis.  She currently she is taking ibuprofen with some moderate relief.  Past Medical History:  Diagnosis Date  . 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 Diabetes mellitus without complication ([place])   . Diverticulosis   . GERD (gastroesophageal reflux disease)   . History of colonic polyps    benign  . Hyperlipidemia   . Hypertension   . Hypothyroidism   . Lung infection   . Mitral valve prolapse   . NSVT (nonsustained ventricular tachycardia) ([place])   . Obstructive sleep apnea    on CPAP  . PAF (paroxysmal atrial fibrillation) ([place])    a.  On Eliquis; b. CHADS2VASc => 3 (HTN, DM, female)  . Sleep apnea      Physical Exam: General: The patient is alert and oriented x3 in no acute distress.  Dermatology: Skin is warm, dry and supple bilateral lower extremities. Negative for open lesions or macerations.  Vascular: Palpable pedal pulses bilaterally. No edema or erythema noted. Capillary refill within normal limits.  Neurological: Epicritic and protective threshold grossly intact bilaterally.   Musculoskeletal Exam: Range of motion within normal limits to all pedal and ankle joints bilateral. Muscle strength 5/5 in all groups bilateral.  Pain on palpation to the right midtarsal joints dorsal.  Radiographic Exam:  Normal osseous mineralization. Joint spaces preserved. No fracture/dislocation/boney destruction.    Assessment: 1.  Capsulitis right foot   Plan of Care:  1. Patient evaluated. X-Rays reviewed.  2.  Injection of 0.5 cc Celestone Soluspan injected into the right midtarsal joint 3.   Continue OTC ibuprofen as needed 4.  Recommend good supportive shoe gear.  Patient declined a cam boot today 5.  Return to clinic as needed      Edrick Kins, DPM [facility]  Dr. Edrick Kins, DPM    [address], [place] [address]                Office [phone]  Fax [phone]

## 2020-07-06 ENCOUNTER — Other Ambulatory Visit: Payer: Self-pay | Admitting: *Deleted

## 2020-07-06 DIAGNOSIS — E782 Mixed hyperlipidemia: Secondary | ICD-10-CM

## 2020-07-06 MED ORDER — EZETIMIBE 10 MG PO TABS: ORAL_TABLET | ORAL | 3 refills | Status: DC

## 2020-07-28 ENCOUNTER — Ambulatory Visit (INDEPENDENT_AMBULATORY_CARE_PROVIDER_SITE_OTHER): Payer: Managed Care, Other (non HMO) | Admitting: Pulmonary Disease

## 2020-07-28 ENCOUNTER — Encounter: Payer: Self-pay | Admitting: Pulmonary Disease

## 2020-07-28 ENCOUNTER — Other Ambulatory Visit: Payer: Self-pay

## 2020-07-28 VITALS — BP 118/74 | HR 71 | Temp 97.4°F | Ht 66.0 in | Wt 180.0 lb

## 2020-07-28 DIAGNOSIS — G4733 Obstructive sleep apnea (adult) (pediatric): Secondary | ICD-10-CM

## 2020-07-28 NOTE — Progress Notes (Signed)
Debra Perez    [id number]    1955-12-25  Primary Care Physician:Clark, Leticia Penna, NP  Referring Physician: Pleas [name], NP [address] [place],  [place] [address]  Chief complaint:   Patient with a history of obstructive sleep apnea  HPI:  History of obstructive sleep apnea, compliant with CPAP use  Machine that started having some issues and took it to the DME company to have it serviced It was becoming noisy  Has had this admission for about 6 years  Sleep apnea was diagnosed when she was out in [place] She is unsure how severe the sleep apnea is for a current machine settings  She continues to use CPAP on a nightly basis and benefits from it Wakes up feeling like she is at a good nights rest  Usually goes to bed about 1030 takes about 5 to 10 minutes to fall asleep 1-2 awakenings final wake up time between 630 and 7 Weight has been relatively stable  No dryness of her mouth in the mornings No morning headaches No night sweats Memory is good No sleepy driving  History of hypertension, atrial fibrillation once in the past, hypercholesterolemia  reformed smoker, quit in 2007 Not limited with activities  No history of lung disease No family history of obstructive sleep apnea Spouse has sleep apnea, on treatment  Outpatient Encounter Medications as of 07/28/2020  Medication Sig  . [name] 50 MG tablet TAKE 1 TABLET BY MOUTH AT BEDTIME  . b complex vitamins capsule Take 1 capsule by mouth 2 (two) times daily.  . cyclobenzaprine (FLEXERIL) 5 MG tablet Take 1 tablet (5 mg total) by mouth 3 (three) times daily as needed for muscle spasms.  Marland Kitchen ezetimibe (ZETIA) 10 MG tablet TAKE 1 TABLET BY MOUTH DAILY GENERIC EQUIVALENT FOR ZETIA  . famotidine (PEPCID) 20 MG tablet TAKE 1 TABLET BY MOUTH TWICE DAILY FOR HEARTBURN  . fluticasone (FLONASE) 50 MCG/ACT nasal spray Place 1 spray into both nostrils 2 (two) times daily.  Marland Kitchen gentamicin  ointment (GARAMYCIN) 0.1 % Apply 1 application topically.  Marland Kitchen glipiZIDE (GLUCOTROL XL) 10 MG 24 hr tablet Take 1 tablet (10 mg total) by mouth daily with breakfast. For diabetes.  . hydrochlorothiazide (HYDRODIURIL) 25 MG tablet Take 1 tablet (25 mg total) by mouth daily.  . Iron-Vitamin C 65-125 MG TABS Take by mouth daily.   Marland Kitchen levothyroxine (SYNTHROID) 88 MCG tablet TAKE 1 TABLET BY MOUTH EVERY MORNING WITH WATER ONLY, NO FOOD OR MEDICATIONS FOR 30 MINUTES  . Magnesium 500 MG CAPS Take 1 capsule by mouth 2 (two) times daily.  . metFORMIN (GLUCOPHAGE-XR) 500 MG 24 hr tablet Take 1 tablet in the morning and 2 tablets in the evening. For diabetes.  . metoprolol succinate (TOPROL-XL) 25 MG 24 hr tablet Take 1 tablet (25 mg total) by mouth daily. Take with or immediately following a meal.  . Microlet Lancets MISC 1 each by Does not apply route as needed.  . Multiple Vitamins-Minerals (MULTIVITAMIN ADULT PO) Take 1 capsule by mouth daily.   . propafenone (RYTHMOL SR) 225 MG 12 hr capsule Take 1 capsule (225 mg total) by mouth 2 (two) times daily.  . quinapril (ACCUPRIL) 20 MG tablet TAKE 1 TABLET BY MOUTH AT BEDTIME for blood pressure.  . rosuvastatin (CRESTOR) 5 MG tablet TAKE 1 TABLET BY MOUTH DAILY GENERIC EQUIVALENT FOR CRESTOR  . silver sulfADIAZINE (SILVADENE) 1 % cream Apply 1 application topically daily.  Marland Kitchen  venlafaxine XR (EFFEXOR-XR) 37.5 MG 24 hr capsule TAKE 1 CAPSULE(37.5MG TOTAL) BY MOUTH DAILY WITH BREAKFAST for hot flashes.  . verapamil (CALAN-SR) 240 MG CR tablet Take 1 tablet (240 mg total) by mouth daily.  . [DISCONTINUED] blood glucose meter kit and supplies Dispense based on patient and insurance preference. Use up to four times daily as directed.   Facility-Administered Encounter Medications as of 07/28/2020  Medication  . heparin lock flush 100 unit/mL  . sodium chloride flush (NS) 0.9 % injection 10 mL    Allergies as of 07/28/2020  . (No Known Allergies)    Past Medical  History:  Diagnosis Date  . 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 Diabetes mellitus without complication ([place])   . Diverticulosis   . GERD (gastroesophageal reflux disease)   . History of colonic polyps    benign  . Hyperlipidemia   . Hypertension   . Hypothyroidism   . Lung infection   . Mitral valve prolapse   . NSVT (nonsustained ventricular tachycardia) ([place])   . Obstructive sleep apnea    on CPAP  . PAF (paroxysmal atrial fibrillation) ([place])    a.  On Eliquis; b. CHADS2VASc => 3 (HTN, DM, female)  . Sleep apnea     Past Surgical History:  Procedure Laterality Date  . ABDOMINAL HYSTERECTOMY  2002   ovaries remain  . CLAVICLE SURGERY  2007   right clavicle plate pins  . COLONOSCOPY WITH PROPOFOL N/A 10/17/2017   Procedure: COLONOSCOPY WITH PROPOFOL;  Surgeon: Jonathon Bellows, MD;  [facility] ENDOSCOPY;  Service: Gastroenterology;  Laterality: N/A;  . ESOPHAGOGASTRODUODENOSCOPY (EGD) WITH PROPOFOL N/A 10/17/2017   Procedure: ESOPHAGOGASTRODUODENOSCOPY (EGD) WITH PROPOFOL;  Surgeon: Jonathon Bellows, MD;  Location[facility] ENDOSCOPY;  Service: Gastroenterology;  Laterality: N/A;  . GIVENS CAPSULE STUDY N/A 11/30/2017   Procedure: GIVENS CAPSULE STUDY;  Surgeon: Jonathon Bellows, MD;  Location: [facility] ENDOSCOPY;  Service: Gastroenterology;  Laterality: N/A;  . KNEE ARTHROSCOPY  2012    Family History  Problem Relation Age of Onset  . Depression Mother   . Sudden death Mother 85  . Heart attack Mother   . Arthritis Father   . Heart disease Father   . Hypertension Father   . Emphysema Father   . Bladder Cancer Sister   . Diabetes Sister        Type 2  . Diabetes Brother        type  . Hypertension Brother   . Emphysema Brother   . Heart disease Brother   . Diabetes Maternal Grandfather   . Bladder Cancer Maternal Grandmother        sinus cancer   . Breast cancer Paternal Grandmother   . Cancer Paternal Grandfather        colon cancer  . Diabetes Paternal Grandfather      Social History   Socioeconomic History  . Marital status: Married    Spouse name: Not on file  . Number of children: Not on file  . Years of education: Not on file  . Highest education level: Not on file  Occupational History  . Occupation: retired  Tobacco Use  . Smoking status: Former Smoker    Packs/day: 1.00    Years: 20.00    Pack years: 20.00    Types: Cigarettes    Quit date: 03/07/2004    Years since quitting: 16.4  . Smokeless tobacco: Never Used  Vaping Use  . Vaping Use: Never used  Substance and Sexual Activity  . Alcohol use: No    Alcohol/week: 0.0 standard drinks  . Drug use: No  . Sexual activity: Not on file  Other Topics Concern  . Not on file  Social History Narrative   Married.   1 daughter.   Moved here from [place] due to husbands occupation.   She is a Corporate treasurer.   Enjoys sewing, gardening.   Social Determinants of Health   Financial Resource Strain: Not on file  Food Insecurity: Not on file  Transportation Needs: Not on file  Physical Activity: Not on file  Stress: Not on file  Social Connections: Not on file  Intimate Partner Violence: Not on file    Review of Systems  Constitutional: Negative for unexpected weight change.  Respiratory: Positive for apnea. Negative for shortness of breath.   Psychiatric/Behavioral: Positive for sleep disturbance.    Vitals:   07/28/20 0859  BP: 118/74  Pulse: 71  Temp: (!) 97.4 F (36.3 C)  SpO2: 96%     Physical Exam Constitutional:      Appearance: Normal appearance.  HENT:     Head: Normocephalic.     Nose: No congestion.     Mouth/Throat:     Mouth: Mucous membranes are moist.     Comments: Mallampati 4, crowded oropharynx Eyes:     General:        Right eye: No discharge.        Left eye: No discharge.     Pupils: Pupils are equal, round, and reactive to light.  Cardiovascular:     Rate and Rhythm: Normal rate.     Heart sounds: No murmur heard. No friction rub.  Pulmonary:      Effort: No respiratory distress.     Breath sounds: No stridor. No wheezing or rhonchi.  Musculoskeletal:     Cervical back: No rigidity or tenderness.  Neurological:     Mental Status: She is alert.  Psychiatric:        Mood and Affect: Mood normal.   Epworth Sleepiness Scale of 5   Assessment:  Patient with a history of obstructive sleep apnea -Compliant with CPAP therapy -Continues to benefit from CPAP therapy -Feels sleep is very restorative  Dysfunctional device -Will benefit from machine upgrade  Pathophysiology of sleep disordered breathing discussed with the patient Treatment options reviewed  Plan/Recommendations: Continue current CPAP therapy  Schedule patient for home sleep study  Tentative follow-up in about 4 months  Encouraged to call with any significant concerns   Sherrilyn Rist MD  Pulmonary and Critical Care 07/28/2020, 9:17 AM  CC: Pleas [name], NP

## 2020-07-28 NOTE — Patient Instructions (Signed)
History of obstructive sleep apnea Compliant with CPAP  Schedule for home sleep study to ascertain severity of sleep apnea We will update you with results as soon as reviewed  Contact medical supply company for new machine once we have results  Tentative follow-up in about 4 months  Call with significant concerns

## 2020-07-29 ENCOUNTER — Telehealth (INDEPENDENT_AMBULATORY_CARE_PROVIDER_SITE_OTHER): Payer: Managed Care, Other (non HMO) | Admitting: Primary Care

## 2020-07-29 ENCOUNTER — Encounter: Payer: Self-pay | Admitting: Primary Care

## 2020-07-29 ENCOUNTER — Other Ambulatory Visit: Payer: Self-pay

## 2020-07-29 DIAGNOSIS — R059 Cough, unspecified: Secondary | ICD-10-CM | POA: Insufficient documentation

## 2020-07-29 MED ORDER — HYDROCOD POLST-CPM POLST ER 10-8 MG/5ML PO SUER: 5.0000 mL | Freq: Two times a day (BID) | ORAL | 0 refills | Status: DC | PRN

## 2020-07-29 NOTE — Patient Instructions (Signed)
Your symptoms are representative of a viral illness which will resolve on its own over time. Our goal is to treat your symptoms in order to aid your body in the healing process and to make you more comfortable.   You may take the cough suppressant every 12 hours as needed for cough and rest. Caution this medication contains codeine which may cause drowsiness.   Please call me Friday this week if your symptoms do not continue to improve.  It was a pleasure to see you today! [name], NP-C

## 2020-07-29 NOTE — Progress Notes (Signed)
Patient ID: Debra Perez, female    DOB: 1955/10/06, 65 y.o.   MRN: [id number]  Virtual visit completed through [employer], a video enabled telemedicine application. Due to national recommendations of social distancing due to COVID-19, a virtual visit is felt to be most appropriate for this patient at this time. Reviewed limitations, risks, security and privacy concerns of performing a virtual visit and the availability of in person appointments. I also reviewed that there may be a patient responsible charge related to this service. The patient agreed to proceed.   Patient location: home Provider location: [place] [facility], office Persons participating in this virtual visit: patient, provider   If any vitals were documented, they were collected by patient at home unless specified below.    There were no vitals taken for this visit.   CC: Cough Subjective:   HPI: Debra Perez is a 65 y.o. female with a history of hypertension, paroxysmal atrial fibrillation, CAD, type 2 diabetes, hyperlipidemia presenting on 07/29/2020 for cough.  Symptoms include cough, chest congestion, body aches, post nasal drip, fatigue. She denies fevers. Symptoms began about 8 days ago. Her cough is congested with green sputum. She tested negative for Covid-19 three times.   She's been taking Mucinex DM, Ibuprofen, and Tylenol. Her daughter was sick with the same symptoms previously. She's feeling some better today. She's not slept well for the last 5 days, is requesting something to help with rest.  She has completed 3 COVID vaccines.       Relevant past medical, surgical, family and social history reviewed and updated as indicated. Interim medical history since our last visit reviewed. Allergies and medications reviewed and updated. Outpatient Medications Prior to Visit  Medication Sig Dispense Refill  . [name] 50 MG tablet TAKE 1 TABLET BY MOUTH AT BEDTIME 90 tablet 1  . b  complex vitamins capsule Take 1 capsule by mouth 2 (two) times daily.    . cyclobenzaprine (FLEXERIL) 5 MG tablet Take 1 tablet (5 mg total) by mouth 3 (three) times daily as needed for muscle spasms. 30 tablet 0  . ezetimibe (ZETIA) 10 MG tablet TAKE 1 TABLET BY MOUTH DAILY GENERIC EQUIVALENT FOR ZETIA 90 tablet 3  . famotidine (PEPCID) 20 MG tablet TAKE 1 TABLET BY MOUTH TWICE DAILY FOR HEARTBURN 180 tablet 3  . fluticasone (FLONASE) 50 MCG/ACT nasal spray Place 1 spray into both nostrils 2 (two) times daily. 16 g 0  . gentamicin ointment (GARAMYCIN) 0.1 % Apply 1 application topically.    Marland Kitchen glipiZIDE (GLUCOTROL XL) 10 MG 24 hr tablet Take 1 tablet (10 mg total) by mouth daily with breakfast. For diabetes. 90 tablet 3  . hydrochlorothiazide (HYDRODIURIL) 25 MG tablet Take 1 tablet (25 mg total) by mouth daily. 90 tablet 0  . Iron-Vitamin C 65-125 MG TABS Take by mouth daily.     Marland Kitchen levothyroxine (SYNTHROID) 88 MCG tablet TAKE 1 TABLET BY MOUTH EVERY MORNING WITH WATER ONLY, NO FOOD OR MEDICATIONS FOR 30 MINUTES 90 tablet 1  . Magnesium 500 MG CAPS Take 1 capsule by mouth 2 (two) times daily.    . metFORMIN (GLUCOPHAGE-XR) 500 MG 24 hr tablet Take 1 tablet in the morning and 2 tablets in the evening. For diabetes. 270 tablet 3  . metoprolol succinate (TOPROL-XL) 25 MG 24 hr tablet Take 1 tablet (25 mg total) by mouth daily. Take with or immediately following a meal. 90 tablet 3  . Microlet Lancets MISC 1 each by  Does not apply route as needed. 200 each 3  . Multiple Vitamins-Minerals (MULTIVITAMIN ADULT PO) Take 1 capsule by mouth daily.     . propafenone (RYTHMOL SR) 225 MG 12 hr capsule Take 1 capsule (225 mg total) by mouth 2 (two) times daily. 180 capsule 3  . quinapril (ACCUPRIL) 20 MG tablet TAKE 1 TABLET BY MOUTH AT BEDTIME for blood pressure. 90 tablet 3  . rosuvastatin (CRESTOR) 5 MG tablet TAKE 1 TABLET BY MOUTH DAILY GENERIC EQUIVALENT FOR CRESTOR 90 tablet 3  . silver sulfADIAZINE  (SILVADENE) 1 % cream Apply 1 application topically daily. 25 g 0  . venlafaxine XR (EFFEXOR-XR) 37.5 MG 24 hr capsule TAKE 1 CAPSULE(37.5MG  TOTAL) BY MOUTH DAILY WITH BREAKFAST for hot flashes. 90 capsule 3  . verapamil (CALAN-SR) 240 MG CR tablet Take 1 tablet (240 mg total) by mouth daily. 90 tablet 3   Facility-Administered Medications Prior to Visit  Medication Dose Route Frequency Provider Last Rate Last Admin  . heparin lock flush 100 unit/mL  500 Units Intracatheter Once PRN Earlie Server, MD      . sodium chloride flush (NS) 0.9 % injection 10 mL  10 mL Intracatheter PRN Earlie Server, MD   10 mL at 10/06/17 1515     Per HPI unless specifically indicated in ROS section below Review of Systems  Constitutional: Negative for chills, fatigue and fever.  HENT: Positive for congestion and postnasal drip.   Respiratory: Positive for cough. Negative for shortness of breath.   Musculoskeletal: Positive for myalgias.   Objective:  There were no vitals taken for this visit.  Wt Readings from Last 3 Encounters:  07/28/20 180 lb (81.6 kg)  06/23/20 182 lb (82.6 kg)  04/13/20 181 lb (82.1 kg)       Physical exam: Gen: alert, NAD, not ill appearing Pulm: speaks in complete sentences without increased work of breathing, no cough during visit. Psych: normal mood, normal thought content      Results for orders placed or performed in visit on 04/13/20  POCT glycosylated hemoglobin (Hb A1C)  Result Value Ref Range   Hemoglobin A1C 7.6 (A) 4.0 - 5.6 %   HbA1c POC (<> result, manual entry)     HbA1c, POC (prediabetic range)     HbA1c, POC (controlled diabetic range)     Assessment & Plan:   Problem List Items Addressed This Visit      Other   Cough - Primary    Symptoms represent viral etiology, and she is starting to feel better. She appears overall well and stable.   Will treat her cough with Tussionex so that she can rest. She will update [place] in 2 days if symptoms do not continue to  improve. At that point consider antibiotic treatment.      Relevant Medications   chlorpheniramine-HYDROcodone (TUSSIONEX PENNKINETIC ER) 10-8 MG/5ML SUER       Meds ordered this encounter  Medications  . chlorpheniramine-HYDROcodone (TUSSIONEX PENNKINETIC ER) 10-8 MG/5ML SUER    Sig: Take 5 mLs by mouth every 12 (twelve) hours as needed for cough.    Dispense:  50 mL    Refill:  0    Order Specific Question:   Supervising Provider    Answer:   BEDSOLE, AMY E [[id number]]   No orders of the defined types were placed in this encounter.   I discussed the assessment and treatment plan with the patient. The patient was provided an opportunity to ask questions and all were  answered. The patient agreed with the plan and demonstrated an understanding of the instructions. The patient was advised to call back or seek an in-person evaluation if the symptoms worsen or if the condition fails to improve as anticipated.  Follow up plan:  Your symptoms are representative of a viral illness which will resolve on its own over time. Our goal is to treat your symptoms in order to aid your body in the healing process and to make you more comfortable.   You may take the cough suppressant every 12 hours as needed for cough and rest. Caution this medication contains codeine which may cause drowsiness.   Please call me Friday this week if your symptoms do not continue to improve.  It was a pleasure to see you today! [name], NP-C   Pleas [name], NP

## 2020-07-29 NOTE — Assessment & Plan Note (Signed)
Symptoms represent viral etiology, and she is starting to feel better. She appears overall well and stable.   Will treat her cough with Tussionex so that she can rest. She will update [place] in 2 days if symptoms do not continue to improve. At that point consider antibiotic treatment.

## 2020-07-30 DIAGNOSIS — R059 Cough, unspecified: Secondary | ICD-10-CM

## 2020-08-04 MED ORDER — ATROVENT HFA 17 MCG/ACT IN AERS: 2.0000 | INHALATION_SPRAY | Freq: Four times a day (QID) | RESPIRATORY_TRACT | 0 refills | Status: DC | PRN

## 2020-08-07 ENCOUNTER — Other Ambulatory Visit: Payer: Self-pay | Admitting: *Deleted

## 2020-08-07 MED ORDER — PROPAFENONE HCL ER 225 MG PO CP12: 225.0000 mg | ORAL_CAPSULE | Freq: Two times a day (BID) | ORAL | 2 refills | Status: DC

## 2020-08-19 ENCOUNTER — Telehealth: Payer: Self-pay | Admitting: Pulmonary Disease

## 2020-08-20 NOTE — Telephone Encounter (Signed)
Order was placed on 5/24 and I verified we have it - still waiting to be authorized.  I spoke to pt & made her aware it normally takes 4-6 weeks before we contact pt to schedule and she states that is fine.  Nothing further needed.

## 2020-08-28 ENCOUNTER — Other Ambulatory Visit: Payer: Self-pay

## 2020-08-28 ENCOUNTER — Other Ambulatory Visit: Payer: Self-pay | Admitting: Primary Care

## 2020-08-28 DIAGNOSIS — I10 Essential (primary) hypertension: Secondary | ICD-10-CM

## 2020-08-28 DIAGNOSIS — G8929 Other chronic pain: Secondary | ICD-10-CM

## 2020-08-28 MED ORDER — ROSUVASTATIN CALCIUM 5 MG PO TABS: ORAL_TABLET | ORAL | 1 refills | Status: DC

## 2020-08-28 MED ORDER — QUINAPRIL HCL 20 MG PO TABS: ORAL_TABLET | ORAL | 1 refills | Status: DC

## 2020-08-28 NOTE — Telephone Encounter (Signed)
Last office visit (video) 07/29/2020 for Cough.  Last refilled 03/16/2020 for #90 with 1 refill.  CPE scheduled 10/13/2020.

## 2020-09-09 NOTE — Telephone Encounter (Signed)
Called patient appointment made.

## 2020-09-09 NOTE — Telephone Encounter (Signed)
I put a hold on the 3 pm slot for tomorrow 09/10/20, can you add her and have her come at that time?

## 2020-09-10 ENCOUNTER — Other Ambulatory Visit: Payer: Self-pay

## 2020-09-10 ENCOUNTER — Ambulatory Visit (INDEPENDENT_AMBULATORY_CARE_PROVIDER_SITE_OTHER): Payer: Managed Care, Other (non HMO) | Admitting: Primary Care

## 2020-09-10 ENCOUNTER — Encounter: Payer: Self-pay | Admitting: Primary Care

## 2020-09-10 VITALS — BP 126/68 | HR 102 | Temp 98.6°F | Ht 66.0 in | Wt 179.0 lb

## 2020-09-10 DIAGNOSIS — R1032 Left lower quadrant pain: Secondary | ICD-10-CM

## 2020-09-10 MED ORDER — AMOXICILLIN-POT CLAVULANATE 875-125 MG PO TABS: 1.0000 | ORAL_TABLET | Freq: Two times a day (BID) | ORAL | 0 refills | Status: DC

## 2020-09-10 NOTE — Progress Notes (Signed)
Subjective:    Patient ID: Debra Perez, female    DOB: 1955/08/01, 65 y.o.   MRN: [id number]  HPI  Debra Perez is a very pleasant 65 y.o. female with a history of GERD, type 2 diabetes, diverticulosis to sigmoid colon, acute diverticulitis, who presents today to discuss abdominal pain.  Her pain is located to the LLQ with radiation to the right lower abdomen which began three days ago. Yesterday she was running a temperature of 101 with nausea. She's been constipated, has been taking stool softeners, FiberCon.   She denies vomiting, bloody stools, diarrhea. She started a liquid diet yesterday. No fevers today. Today she's feeling better, but still notices tenderness to the abdomen with pain internally. Symptoms feel like typical diverticulitis flare.   She cannot tolerate Flagyl as it "tears up my stomach".   She does experience mild flares with the same pain, has not notified us of these symptoms. She experiences mild symptoms 3-4 times annually.   BP Readings from Last 3 Encounters:  09/10/20 126/68  07/28/20 118/74  06/23/20 124/68      Review of Systems  Constitutional:  Negative for fever.  Gastrointestinal:  Positive for abdominal pain, constipation and nausea. Negative for diarrhea and vomiting.        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  On Eliquis; b. CHADS2VASc => 3 (HTN, DM, female)   Sleep apnea     Social History   Socioeconomic History   Marital status: Married    Spouse name: Not on file   Number of children: Not  on file   Years of education: Not on file   Highest education level: Not on file  Occupational History   Occupation: retired  Tobacco Use   Smoking status: Former    Packs/day: 1.00    Years: 20.00    Pack years: 20.00    Types: Cigarettes    Quit date: 03/07/2004    Years since quitting: 16.5   Smokeless tobacco: Never  Vaping Use   Vaping Use: Never used  Substance and Sexual Activity   Alcohol use: No    Alcohol/week: 0.0 standard drinks   Drug use: No   Sexual activity: Not on file  Other Topics Concern   Not on file  Social History Narrative   Married.   1 daughter.   Moved here from [place] due to husbands occupation.   She is a Corporate treasurer.   Enjoys sewing, gardening.   Social Determinants of Health   Financial Resource Strain: Not on file  Food Insecurity: Not on file  Transportation Needs: Not on file  Physical Activity: Not on file  Stress: Not on file  Social Connections: Not on file  Intimate Partner Violence: Not on file    Past Surgical History:  Procedure Laterality Date   ABDOMINAL HYSTERECTOMY  2002   ovaries remain   CLAVICLE SURGERY  2007   right clavicle plate pins   COLONOSCOPY WITH  PROPOFOL N/A 10/17/2017   Procedure: COLONOSCOPY WITH PROPOFOL;  Surgeon: Jonathon Bellows, MD;  Location: [facility] ENDOSCOPY;  Service: Gastroenterology;  Laterality: N/A;   ESOPHAGOGASTRODUODENOSCOPY (EGD) WITH PROPOFOL N/A 10/17/2017   Procedure: ESOPHAGOGASTRODUODENOSCOPY (EGD) WITH PROPOFOL;  Surgeon: Jonathon Bellows, MD;  Location[facility] ENDOSCOPY;  Service: Gastroenterology;  Laterality: N/A;   GIVENS CAPSULE STUDY N/A 11/30/2017   Procedure: GIVENS CAPSULE STUDY;  Surgeon: Jonathon Bellows, MD;  Location: [facility] ENDOSCOPY;  Service: Gastroenterology;  Laterality: N/A;   KNEE ARTHROSCOPY  2012    Family History  Problem Relation Age of Onset   Depression Mother    Sudden death Mother 38   Heart attack Mother    Arthritis Father    Heart disease Father    Hypertension Father     Emphysema Father    Bladder Cancer Sister    Diabetes Sister        Type 2   Diabetes Brother        type   Hypertension Brother    Emphysema Brother    Heart disease Brother    Diabetes Maternal Grandfather    Bladder Cancer Maternal Grandmother        sinus cancer    Breast cancer Paternal Grandmother    Cancer Paternal Grandfather        colon cancer   Diabetes Paternal Grandfather     No Known Allergies  Current Outpatient Medications on File Prior to Visit  Medication Sig Dispense Refill   Accu-Chek FastClix Lancets MISC USE AS DIRECTED TO TEST BLOOD SUGAR 2 TIMES DAILY 204 each 3   [name] 50 MG tablet TAKE 1 TABLET BY MOUTH AT BEDTIME for back pain. 90 tablet 0   b complex vitamins capsule Take 1 capsule by mouth 2 (two) times daily.     chlorpheniramine-HYDROcodone (TUSSIONEX PENNKINETIC ER) 10-8 MG/5ML SUER Take 5 mLs by mouth every 12 (twelve) hours as needed for cough. 50 mL 0   cyclobenzaprine (FLEXERIL) 5 MG tablet Take 1 tablet (5 mg total) by mouth 3 (three) times daily as needed for muscle spasms. 30 tablet 0   ezetimibe (ZETIA) 10 MG tablet TAKE 1 TABLET BY MOUTH DAILY GENERIC EQUIVALENT FOR ZETIA 90 tablet 3   famotidine (PEPCID) 20 MG tablet TAKE 1 TABLET BY MOUTH TWICE DAILY FOR HEARTBURN 180 tablet 3   fluticasone (FLONASE) 50 MCG/ACT nasal spray Place 1 spray into both nostrils 2 (two) times daily. 16 g 0   gentamicin ointment (GARAMYCIN) 0.1 % Apply 1 application topically.     glipiZIDE (GLUCOTROL XL) 10 MG 24 hr tablet Take 1 tablet (10 mg total) by mouth daily with breakfast. For diabetes. 90 tablet 3   ipratropium (ATROVENT HFA) 17 MCG/ACT inhaler Inhale 2 puffs into the lungs every 6 (six) hours as needed for wheezing. 1 each 0   Iron-Vitamin C 65-125 MG TABS Take by mouth daily.      levothyroxine (SYNTHROID) 88 MCG tablet TAKE 1 TABLET BY MOUTH EVERY MORNING WITH WATER ONLY, NO FOOD OR MEDICATIONS FOR 30 MINUTES 90 tablet 1   Magnesium  500 MG CAPS Take 1 capsule by mouth 2 (two) times daily.     metFORMIN (GLUCOPHAGE-XR) 500 MG 24 hr tablet Take 1 tablet in the morning and 2 tablets in the evening. For diabetes. 270 tablet 3   metoprolol succinate (TOPROL-XL) 25 MG 24 hr tablet Take 1 tablet (25 mg total) by mouth daily. Take with or immediately following a meal. 90 tablet 3  Multiple Vitamins-Minerals (MULTIVITAMIN ADULT PO) Take 1 capsule by mouth daily.      propafenone (RYTHMOL SR) 225 MG 12 hr capsule Take 1 capsule (225 mg total) by mouth 2 (two) times daily. 180 capsule 2   quinapril (ACCUPRIL) 20 MG tablet TAKE 1 TABLET BY MOUTH AT BEDTIME for blood pressure. 90 tablet 1   rosuvastatin (CRESTOR) 5 MG tablet TAKE 1 TABLET BY MOUTH DAILY GENERIC EQUIVALENT FOR CRESTOR 90 tablet 1   silver sulfADIAZINE (SILVADENE) 1 % cream Apply 1 application topically daily. 25 g 0   venlafaxine XR (EFFEXOR-XR) 37.5 MG 24 hr capsule TAKE 1 CAPSULE(37.5MG  TOTAL) BY MOUTH DAILY WITH BREAKFAST for hot flashes. 90 capsule 3   verapamil (CALAN-SR) 240 MG CR tablet Take 1 tablet (240 mg total) by mouth daily. 90 tablet 3   hydrochlorothiazide (HYDRODIURIL) 25 MG tablet Take 1 tablet (25 mg total) by mouth daily. 90 tablet 0   Current Facility-Administered Medications on File Prior to Visit  Medication Dose Route Frequency Provider Last Rate Last Admin   heparin lock flush 100 unit/mL  500 Units Intracatheter Once PRN Earlie Server, MD       sodium chloride flush (NS) 0.9 % injection 10 mL  10 mL Intracatheter PRN Earlie Server, MD   10 mL at 10/06/17 1515    BP 126/68   Pulse (!) 102   Temp 98.6 F (37 C) (Temporal)   Ht 5\' 6"  (1.676 m)   Wt 179 lb (81.2 kg)   SpO2 97%   BMI 28.89 kg/m  Objective:   Physical Exam Constitutional:      General: She is not in acute distress.    Appearance: She is not ill-appearing.  Abdominal:     Tenderness: There is abdominal tenderness in the right lower quadrant and left lower quadrant. There is no  guarding.  Neurological:     Mental Status: She is alert.          Assessment & Plan:      This visit occurred during the SARS-CoV-2 public health emergency.  Safety protocols were in place, including screening questions prior to the visit, additional usage of staff PPE, and extensive cleaning of exam room while observing appropriate contact time as indicated for disinfecting solutions.

## 2020-09-10 NOTE — Assessment & Plan Note (Signed)
Acute for the last three days, fevers yesterday. History of diverticulosis to sigmoid colon. History of acute diverticulitis.  Tender on exam today. Given history, coupled with exam findings and fevers, will treat for presumed diverticulitis.    Rx for Augmentin course sent to pharmacy. Continue clear liquid diet. She will update if no improvement in 3-4 days.

## 2020-09-10 NOTE — Patient Instructions (Signed)
Start Augmentin antibiotics for the infection Take 1 tablet by mouth twice daily for 10 days.  Continue a clear liquid diet until pain is much better.  It was a pleasure to see you today!

## 2020-09-15 ENCOUNTER — Ambulatory Visit: Payer: Managed Care, Other (non HMO)

## 2020-09-15 ENCOUNTER — Other Ambulatory Visit: Payer: Self-pay

## 2020-09-15 DIAGNOSIS — G4733 Obstructive sleep apnea (adult) (pediatric): Secondary | ICD-10-CM | POA: Diagnosis not present

## 2020-09-28 ENCOUNTER — Telehealth: Payer: Self-pay | Admitting: Pulmonary Disease

## 2020-09-28 DIAGNOSIS — G4733 Obstructive sleep apnea (adult) (pediatric): Secondary | ICD-10-CM

## 2020-09-28 NOTE — Telephone Encounter (Signed)
Call patient  Sleep study result  Date of study: 09/15/2020  Impression: Mild obstructive sleep apnea Moderate oxygen desaturations  Recommendation: Recommend continuing CPAP therapy  Recommend CPAP therapy for mild obstructive sleep apnea with moderate oxygen desaturations  Auto titrating CPAP with pressure settings of 5-15 will be appropriate  Encourage weight loss measures  Follow-up in the office 4 to 6 weeks following initiation of treatment

## 2020-10-05 NOTE — Telephone Encounter (Signed)
I called and spoke with patient regarding CPAP titration results and D. Olalere recs. Patient verbalized understanding and I sent in order with pressures to [facility] and requested a new machine per patient. Informed patient to call with any questions/concerns. Nothing further needed.

## 2020-10-13 ENCOUNTER — Encounter: Payer: Self-pay | Admitting: Primary Care

## 2020-10-13 ENCOUNTER — Ambulatory Visit (INDEPENDENT_AMBULATORY_CARE_PROVIDER_SITE_OTHER): Payer: Managed Care, Other (non HMO) | Admitting: Primary Care

## 2020-10-13 ENCOUNTER — Other Ambulatory Visit: Payer: Self-pay

## 2020-10-13 VITALS — BP 126/68 | HR 97 | Temp 97.6°F | Ht 66.0 in | Wt 182.0 lb

## 2020-10-13 DIAGNOSIS — E782 Mixed hyperlipidemia: Secondary | ICD-10-CM

## 2020-10-13 DIAGNOSIS — K219 Gastro-esophageal reflux disease without esophagitis: Secondary | ICD-10-CM

## 2020-10-13 DIAGNOSIS — I472 Ventricular tachycardia: Secondary | ICD-10-CM | POA: Diagnosis not present

## 2020-10-13 DIAGNOSIS — I4729 Other ventricular tachycardia: Secondary | ICD-10-CM

## 2020-10-13 DIAGNOSIS — I10 Essential (primary) hypertension: Secondary | ICD-10-CM

## 2020-10-13 DIAGNOSIS — E039 Hypothyroidism, unspecified: Secondary | ICD-10-CM

## 2020-10-13 DIAGNOSIS — E1159 Type 2 diabetes mellitus with other circulatory complications: Secondary | ICD-10-CM

## 2020-10-13 DIAGNOSIS — I25119 Atherosclerotic heart disease of native coronary artery with unspecified angina pectoris: Secondary | ICD-10-CM

## 2020-10-13 DIAGNOSIS — I480 Paroxysmal atrial fibrillation: Secondary | ICD-10-CM

## 2020-10-13 DIAGNOSIS — J3489 Other specified disorders of nose and nasal sinuses: Secondary | ICD-10-CM

## 2020-10-13 DIAGNOSIS — G8929 Other chronic pain: Secondary | ICD-10-CM

## 2020-10-13 DIAGNOSIS — M5441 Lumbago with sciatica, right side: Secondary | ICD-10-CM

## 2020-10-13 DIAGNOSIS — Z0000 Encounter for general adult medical examination without abnormal findings: Secondary | ICD-10-CM

## 2020-10-13 DIAGNOSIS — R232 Flushing: Secondary | ICD-10-CM

## 2020-10-13 DIAGNOSIS — D509 Iron deficiency anemia, unspecified: Secondary | ICD-10-CM

## 2020-10-13 DIAGNOSIS — M8580 Other specified disorders of bone density and structure, unspecified site: Secondary | ICD-10-CM

## 2020-10-13 LAB — CBC
HCT: 39.9 % (ref 36.0–46.0)
Hemoglobin: 13.3 g/dL (ref 12.0–15.0)
MCHC: 33.3 g/dL (ref 30.0–36.0)
MCV: 93.3 fl (ref 78.0–100.0)
Platelets: 261 10*3/uL (ref 150.0–400.0)
RBC: 4.28 Mil/uL (ref 3.87–5.11)
RDW: 14.5 % (ref 11.5–15.5)
WBC: 8.7 10*3/uL (ref 4.0–10.5)

## 2020-10-13 LAB — COMPREHENSIVE METABOLIC PANEL
ALT: 25 U/L (ref 0–35)
AST: 18 U/L (ref 0–37)
Albumin: 4.1 g/dL (ref 3.5–5.2)
Alkaline Phosphatase: 38 U/L — ABNORMAL LOW (ref 39–117)
BUN: 14 mg/dL (ref 6–23)
CO2: 30 mEq/L (ref 19–32)
Calcium: 9.5 mg/dL (ref 8.4–10.5)
Chloride: 98 mEq/L (ref 96–112)
Creatinine, Ser: 0.77 mg/dL (ref 0.40–1.20)
GFR: 82.54 mL/min (ref >60.00)
Glucose, Bld: 159 mg/dL — ABNORMAL HIGH (ref 70–99)
Potassium: 4.5 mEq/L (ref 3.5–5.1)
Sodium: 135 mEq/L (ref 135–145)
Total Bilirubin: 0.5 mg/dL (ref 0.2–1.2)
Total Protein: 6.5 g/dL (ref 6.0–8.3)

## 2020-10-13 LAB — LIPID PANEL
Cholesterol: 136 mg/dL (ref 0–200)
HDL: 58.4 mg/dL (ref >39.00)
LDL Cholesterol: 58 mg/dL (ref 0–99)
NonHDL: 77.68
Total CHOL/HDL Ratio: 2
Triglycerides: 97 mg/dL (ref 0.0–149.0)
VLDL: 19.4 mg/dL (ref 0.0–40.0)

## 2020-10-13 LAB — TSH: TSH: 0.86 u[IU]/mL (ref 0.35–5.50)

## 2020-10-13 LAB — IBC + FERRITIN
Ferritin: 22.1 ng/mL (ref 10.0–291.0)
Iron: 78 ug/dL (ref 42–145)
Saturation Ratios: 21.8 % (ref 20.0–50.0)
Transferrin: 255 mg/dL (ref 212.0–360.0)

## 2020-10-13 LAB — HEMOGLOBIN A1C: Hgb A1c MFr Bld: 8.2 % — ABNORMAL HIGH (ref 4.6–6.5)

## 2020-10-13 MED ORDER — FREESTYLE LIBRE 2 SENSOR MISC: 1 refills | Status: DC

## 2020-10-13 MED ORDER — FREESTYLE LIBRE 2 READER DEVI: 0 refills | Status: DC

## 2020-10-13 MED ORDER — GENTAMICIN SULFATE 0.1 % EX OINT: 1.0000 "application " | TOPICAL_OINTMENT | Freq: Every day | CUTANEOUS | 0 refills | Status: DC | PRN

## 2020-10-13 NOTE — Assessment & Plan Note (Signed)
No recent episodes, continue verapamil SR 240 mg, metoprolol succinate 25 mg.   Following with cardiology

## 2020-10-13 NOTE — Assessment & Plan Note (Signed)
Refill provided for gentamycin ointment.  Doing well with PRN use.

## 2020-10-13 NOTE — Assessment & Plan Note (Signed)
Immunizations UTD. Mammogram UTD. Colonoscopy UTD, due in 2024  Discussed the importance of a healthy diet and regular exercise in order for weight loss, and to reduce the risk of further co-morbidity.  Exam today stable. Labs pending.

## 2020-10-13 NOTE — Assessment & Plan Note (Signed)
Asymptomatic.  Follows with cardiology.  Continue lipid and BP control.

## 2020-10-13 NOTE — Assessment & Plan Note (Signed)
Rate and rhythm regular today, continue metoprolol succinate 25 mg, verapamil SR 240 mg. No longer on Eliquis.   Following with cardiology

## 2020-10-13 NOTE — Progress Notes (Signed)
Subjective:    Patient ID: Debra Perez, female    DOB: 22-Feb-1956, 65 y.o.   MRN: [id number]  HPI  Debra Perez is a very pleasant 65 y.o. female who presents today for complete physical and follow up of chronic conditions.  Immunizations: -Tetanus: 2016 -Influenza: Due this season  -Covid-19: 4 vaccines -Shingles: Shingrix -Pneumonia: 2021  Diet: [place].  Exercise: No regular exercise.  Eye exam: Completes annually  Dental exam: Completes semi-annually   Pap Smear: Hysterectomy  Mammogram: Completed in November 2021 Colonoscopy: Completed in 2019, due 2024  BP Readings from Last 3 Encounters:  10/13/20 126/68  09/10/20 126/68  07/28/20 118/74       Review of Systems  Constitutional:  Negative for unexpected weight change.  HENT:  Negative for rhinorrhea.   Respiratory:  Negative for cough and shortness of breath.   Cardiovascular:  Negative for chest pain.  Gastrointestinal:  Negative for constipation and diarrhea.  Genitourinary:  Negative for difficulty urinating.  Musculoskeletal:  Positive for arthralgias and back pain.  Skin:  Negative for rash.  Allergic/Immunologic: Negative for environmental allergies.  Neurological:  Negative for dizziness, numbness and headaches.  Psychiatric/Behavioral:  The patient is not nervous/anxious.         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  On Eliquis; b. CHADS2VASc => 3 (HTN, DM, female)   Sleep apnea     Social History   Socioeconomic History   Marital status: Married     Spouse name: Not on file   Number of children: Not on file   Years of education: Not on file   Highest education level: Not on file  Occupational History   Occupation: retired  Tobacco Use   Smoking status: Former    Packs/day: 1.00    Years: 20.00    Pack years: 20.00    Types: Cigarettes    Quit date: 03/07/2004    Years since quitting: 16.6   Smokeless tobacco: Never  Vaping Use   Vaping Use: Never used  Substance and Sexual Activity   Alcohol use: No    Alcohol/week: 0.0 standard drinks   Drug use: No   Sexual activity: Not on file  Other Topics Concern   Not on file  Social History Narrative   Married.   1 daughter.   Moved here from [place] due to husbands occupation.   She is a Corporate treasurer.   Enjoys sewing, gardening.   Social Determinants of Health   Financial Resource Strain: Not on file  Food Insecurity: Not on file  Transportation Needs: Not on file  Physical Activity: Not on file  Stress: Not on file  Social Connections: Not on file  Intimate Partner Violence: Not on file    Past Surgical History:  Procedure Laterality Date   ABDOMINAL HYSTERECTOMY  2002   ovaries remain   CLAVICLE SURGERY  2007   right clavicle plate pins   COLONOSCOPY WITH PROPOFOL N/A 10/17/2017  Procedure: COLONOSCOPY WITH PROPOFOL;  Surgeon: Jonathon Bellows, MD;  Location: [facility] ENDOSCOPY;  Service: Gastroenterology;  Laterality: N/A;   ESOPHAGOGASTRODUODENOSCOPY (EGD) WITH PROPOFOL N/A 10/17/2017   Procedure: ESOPHAGOGASTRODUODENOSCOPY (EGD) WITH PROPOFOL;  Surgeon: Jonathon Bellows, MD;  Location: [facility] ENDOSCOPY;  Service: Gastroenterology;  Laterality: N/A;   GIVENS CAPSULE STUDY N/A 11/30/2017   Procedure: GIVENS CAPSULE STUDY;  Surgeon: Jonathon Bellows, MD;  Location: [facility] ENDOSCOPY;  Service: Gastroenterology;  Laterality: N/A;   KNEE ARTHROSCOPY  2012    Family History  Problem Relation Age of Onset   Depression Mother    Sudden death Mother 49   Heart attack Mother    Arthritis Father     Heart disease Father    Hypertension Father    Emphysema Father    Bladder Cancer Sister    Diabetes Sister        Type 2   Diabetes Brother        type   Hypertension Brother    Emphysema Brother    Heart disease Brother    Diabetes Maternal Grandfather    Bladder Cancer Maternal Grandmother        sinus cancer    Breast cancer Paternal Grandmother    Cancer Paternal Grandfather        colon cancer   Diabetes Paternal Grandfather     No Known Allergies  Current Outpatient Medications on File Prior to Visit  Medication Sig Dispense Refill   Accu-Chek FastClix Lancets MISC USE AS DIRECTED TO TEST BLOOD SUGAR 2 TIMES DAILY 204 each 3   [name] 50 MG tablet TAKE 1 TABLET BY MOUTH AT BEDTIME for back pain. 90 tablet 0   b complex vitamins capsule Take 1 capsule by mouth 2 (two) times daily.     cyclobenzaprine (FLEXERIL) 5 MG tablet Take 1 tablet (5 mg total) by mouth 3 (three) times daily as needed for muscle spasms. 30 tablet 0   ezetimibe (ZETIA) 10 MG tablet TAKE 1 TABLET BY MOUTH DAILY GENERIC EQUIVALENT FOR ZETIA 90 tablet 3   famotidine (PEPCID) 20 MG tablet TAKE 1 TABLET BY MOUTH TWICE DAILY FOR HEARTBURN 180 tablet 3   fluticasone (FLONASE) 50 MCG/ACT nasal spray Place 1 spray into both nostrils 2 (two) times daily. 16 g 0   gentamicin ointment (GARAMYCIN) 0.1 % Apply 1 application topically.     glipiZIDE (GLUCOTROL XL) 10 MG 24 hr tablet Take 1 tablet (10 mg total) by mouth daily with breakfast. For diabetes. 90 tablet 3   hydrochlorothiazide (HYDRODIURIL) 25 MG tablet Take 1 tablet (25 mg total) by mouth daily. 90 tablet 0   Iron-Vitamin C 65-125 MG TABS Take by mouth daily.      levothyroxine (SYNTHROID) 88 MCG tablet TAKE 1 TABLET BY MOUTH EVERY MORNING WITH WATER ONLY, NO FOOD OR MEDICATIONS FOR 30 MINUTES 90 tablet 1   Magnesium 500 MG CAPS Take 1 capsule by mouth 2 (two) times daily.     metFORMIN (GLUCOPHAGE-XR) 500 MG 24 hr tablet Take 1 tablet in  the morning and 2 tablets in the evening. For diabetes. 270 tablet 3   metoprolol succinate (TOPROL-XL) 25 MG 24 hr tablet Take 1 tablet (25 mg total) by mouth daily. Take with or immediately following a meal. 90 tablet 3   Multiple Vitamins-Minerals (MULTIVITAMIN ADULT PO) Take 1 capsule by mouth daily.      propafenone (RYTHMOL SR) 225 MG 12 hr capsule Take 1 capsule (225 mg total) by mouth 2 (two)  times daily. 180 capsule 2   quinapril (ACCUPRIL) 20 MG tablet TAKE 1 TABLET BY MOUTH AT BEDTIME for blood pressure. 90 tablet 1   rosuvastatin (CRESTOR) 5 MG tablet TAKE 1 TABLET BY MOUTH DAILY GENERIC EQUIVALENT FOR CRESTOR 90 tablet 1   venlafaxine XR (EFFEXOR-XR) 37.5 MG 24 hr capsule TAKE 1 CAPSULE(37.'5MG'$  TOTAL) BY MOUTH DAILY WITH BREAKFAST for hot flashes. 90 capsule 3   verapamil (CALAN-SR) 240 MG CR tablet Take 1 tablet (240 mg total) by mouth daily. 90 tablet 3   Current Facility-Administered Medications on File Prior to Visit  Medication Dose Route Frequency Provider Last Rate Last Admin   heparin lock flush 100 unit/mL  500 Units Intracatheter Once PRN Earlie Server, MD       sodium chloride flush (NS) 0.9 % injection 10 mL  10 mL Intracatheter PRN Earlie Server, MD   10 mL at 10/06/17 1515    BP 126/68   Pulse 97   Temp 97.6 F (36.4 C) (Temporal)   Ht '5\' 6"'$  (1.676 m)   Wt 182 lb (82.6 kg)   SpO2 97%   BMI 29.38 kg/m  Objective:   Physical Exam HENT:     Right Ear: Tympanic membrane and ear canal normal.     Left Ear: Tympanic membrane and ear canal normal.     Nose: Nose normal.  Eyes:     Conjunctiva/sclera: Conjunctivae normal.     Pupils: Pupils are equal, round, and reactive to light.  Neck:     Thyroid: No thyromegaly.  Cardiovascular:     Rate and Rhythm: Normal rate and regular rhythm.     Heart sounds: No murmur heard. Pulmonary:     Effort: Pulmonary effort is normal.     Breath sounds: Normal breath sounds. No rales.  Abdominal:     General: Bowel sounds are  normal.     Palpations: Abdomen is soft.     Tenderness: There is no abdominal tenderness.  Musculoskeletal:        General: Normal range of motion.     Cervical back: Neck supple.  Lymphadenopathy:     Cervical: No cervical adenopathy.  Skin:    General: Skin is warm and dry.     Findings: No rash.  Neurological:     Mental Status: She is alert and oriented to person, place, and time.     Cranial Nerves: No cranial nerve deficit.     Deep Tendon Reflexes: Reflexes are normal and symmetric.  Psychiatric:        Mood and Affect: Mood normal.          Assessment & Plan:      This visit occurred during the SARS-CoV-2 public health emergency.  Safety protocols were in place, including screening questions prior to the visit, additional usage of staff PPE, and extensive cleaning of exam room while observing appropriate contact time as indicated for disinfecting solutions.

## 2020-10-13 NOTE — Assessment & Plan Note (Signed)
Compliant to oral iron. CBC and iron studies pending.

## 2020-10-13 NOTE — Assessment & Plan Note (Addendum)
Well controlled in the office today. Continue quinapril 20 mg, HCTZ 25 mg, metoprolol succinate 25 mg, verapamil SR 240 mg.   CMP pending.

## 2020-10-13 NOTE — Patient Instructions (Signed)
Stop by the lab prior to leaving today. I will notify you of your results once received.   Please schedule a follow up visit for 6 months for diabetes check.   It was a pleasure to see you today!  Preventive Care 9-63 Years Old, Female Preventive care refers to lifestyle choices and visits with your health care provider that can promote health and wellness. This includes: A yearly physical exam. This is also called an annual wellness visit. Regular dental and eye exams. Immunizations. Screening for certain conditions. Healthy lifestyle choices, such as: Eating a healthy diet. Getting regular exercise. Not using drugs or products that contain nicotine and tobacco. Limiting alcohol use. What can I expect for my preventive care visit? Physical exam Your health care provider will check your: Height and weight. These may be used to calculate your BMI (body mass index). BMI is a measurement that tells if you are at a healthy weight. Heart rate and blood pressure. Body temperature. Skin for abnormal spots. Counseling Your health care provider may ask you questions about your: Past medical problems. Family's medical history. Alcohol, tobacco, and drug use. Emotional well-being. Home life and relationship well-being. Sexual activity. Diet, exercise, and sleep habits. Work and work Statistician. Access to firearms. Method of birth control. Menstrual cycle. Pregnancy history. What immunizations do I need?  Vaccines are usually given at various ages, according to a schedule. Your health care provider will recommend vaccines for you based on your age, medicalhistory, and lifestyle or other factors, such as travel or where you work. What tests do I need? Blood tests Lipid and cholesterol levels. These may be checked every 5 years, or more often if you are over 8 years old. Hepatitis C test. Hepatitis B test. Screening Lung cancer screening. You may have this screening every year  starting at age 44 if you have a 30-pack-year history of smoking and currently smoke or have quit within the past 15 years. Colorectal cancer screening. All adults should have this screening starting at age 40 and continuing until age 57. Your health care provider may recommend screening at age 22 if you are at increased risk. You will have tests every 1-10 years, depending on your results and the type of screening test. Diabetes screening. This is done by checking your blood sugar (glucose) after you have not eaten for a while (fasting). You may have this done every 1-3 years. Mammogram. This may be done every 1-2 years. Talk with your health care provider about when you should start having regular mammograms. This may depend on whether you have a family history of breast cancer. BRCA-related cancer screening. This may be done if you have a family history of breast, ovarian, tubal, or peritoneal cancers. Pelvic exam and Pap test. This may be done every 3 years starting at age 30. Starting at age 11, this may be done every 5 years if you have a Pap test in combination with an HPV test. Other tests STD (sexually transmitted disease) testing, if you are at risk. Bone density scan. This is done to screen for osteoporosis. You may have this scan if you are at high risk for osteoporosis. Talk with your health care provider about your test results, treatment options,and if necessary, the need for more tests. Follow these instructions at home: Eating and drinking  Eat a diet that includes fresh fruits and vegetables, whole grains, lean protein, and low-fat dairy products. Take vitamin and mineral supplements as recommended by your health care provider.  Do not drink alcohol if: Your health care provider tells you not to drink. You are pregnant, may be pregnant, or are planning to become pregnant. If you drink alcohol: Limit how much you have to 0-1 drink a day. Be aware of how much alcohol is in  your drink. In the [place] one drink equals one 12 oz bottle of beer (355 mL), one 5 oz glass of wine (148 mL), or one 1 oz glass of hard liquor (44 mL).  Lifestyle Take daily care of your teeth and gums. Brush your teeth every morning and night with fluoride toothpaste. Floss one time each day. Stay active. Exercise for at least 30 minutes 5 or more days each week. Do not use any products that contain nicotine or tobacco, such as cigarettes, e-cigarettes, and chewing tobacco. If you need help quitting, ask your health care provider. Do not use drugs. If you are sexually active, practice safe sex. Use a condom or other form of protection to prevent STIs (sexually transmitted infections). If you do not wish to become pregnant, use a form of birth control. If you plan to become pregnant, see your health care provider for a prepregnancy visit. If told by your health care provider, take low-dose aspirin daily starting at age 43. Find healthy ways to cope with stress, such as: Meditation, yoga, or listening to music. Journaling. Talking to a trusted person. Spending time with friends and family. Safety Always wear your seat belt while driving or riding in a vehicle. Do not drive: If you have been drinking alcohol. Do not ride with someone who has been drinking. When you are tired or distracted. While texting. Wear a helmet and other protective equipment during sports activities. If you have firearms in your house, make sure you follow all gun safety procedures. What's next? Visit your health care provider once a year for an annual wellness visit. Ask your health care provider how often you should have your eyes and teeth checked. Stay up to date on all vaccines. This information is not intended to replace advice given to you by your health care provider. Make sure you discuss any questions you have with your healthcare provider. Document Revised: 11/26/2019 Document Reviewed:  11/02/2017 Elsevier Patient Education  2022 [employer].

## 2020-10-13 NOTE — Assessment & Plan Note (Signed)
Compliant to Zetia 10 mg and Crestor 5 mg. Continue same. Repeat lipid panel pending.

## 2020-10-13 NOTE — Assessment & Plan Note (Addendum)
Following with orthopedics, receiving steroid injections. Improving.   Continue PRN Flexeril 5 mg and daily amitriptyline 50 mg.

## 2020-10-13 NOTE — Assessment & Plan Note (Signed)
Compliant to calcium and vitamin D. Encouraged regular exercise.

## 2020-10-13 NOTE — Assessment & Plan Note (Signed)
Taking levothyroxine correctly except that she's been taking 1 and 1/2 tablet for the last one month. Discussed not to take more than what is prescribed.  Repeat TSH pending. If TSH is too low then will continue with 88 mcg and repeat TSH in 6 weeks.   Continue levothyroxine 88 mcg daily.

## 2020-10-13 NOTE — Assessment & Plan Note (Addendum)
Repeat A1C pending. Has been receiving steroid injections for chronic back pain, also eating a lot of ice cream lately.   Continue metformin XR 500 mg in AM and 1000 mg in PM, Glipizide XL 10 mg  Managed on statin and ACE-I. Pneumonia vaccine UTD. Foot exam UTD. Eye exam scheduled.   Follow up in 3-6 months depending on A1C result

## 2020-10-13 NOTE — Assessment & Plan Note (Signed)
Doing well on venlafaxine ER 37.5 mg, continue same.

## 2020-10-13 NOTE — Assessment & Plan Note (Signed)
Doing well on famotidine 20 mg BID. Continue same.

## 2020-10-20 DIAGNOSIS — G4733 Obstructive sleep apnea (adult) (pediatric): Secondary | ICD-10-CM

## 2020-10-20 NOTE — Telephone Encounter (Signed)
Dr. Ander Slade, please advise on mychart message below, thanks!  Dr.O ordered my Cpap 5-15. Before, I was on continuous 7.  Wondering if he could up the 5 to 7-15?  Thank u.  Debra Perez [birth year] [id number]  I'm sorry. I'm having a hard time sleeping with the 5.

## 2020-10-27 LAB — HM DIABETES EYE EXAM

## 2020-10-27 NOTE — Telephone Encounter (Signed)
Order can be changed to 7-15  -Change CPAP pressure from 5-15 to 7-15  DME referral for order change

## 2020-10-30 ENCOUNTER — Other Ambulatory Visit: Payer: Self-pay | Admitting: Pulmonary Disease

## 2020-10-30 ENCOUNTER — Telehealth: Payer: Self-pay | Admitting: Pulmonary Disease

## 2020-10-30 DIAGNOSIS — G4733 Obstructive sleep apnea (adult) (pediatric): Secondary | ICD-10-CM

## 2020-10-30 NOTE — Telephone Encounter (Signed)
Call made to patient, confirmed DOB. Requesting order for cpap pressure be sent to [facility].   Order printed and faxed to [facility].   Voiced understanding.   Nothing further needed at this time.

## 2020-10-30 NOTE — Progress Notes (Signed)
error 

## 2020-11-16 ENCOUNTER — Encounter: Payer: Self-pay | Admitting: Primary Care

## 2020-11-21 ENCOUNTER — Other Ambulatory Visit: Payer: Self-pay | Admitting: Primary Care

## 2020-11-21 DIAGNOSIS — G8929 Other chronic pain: Secondary | ICD-10-CM

## 2020-11-25 DIAGNOSIS — E1159 Type 2 diabetes mellitus with other circulatory complications: Secondary | ICD-10-CM

## 2020-11-25 NOTE — Telephone Encounter (Signed)
I spoke with pt who is on her way back from [facility] but plans to spend night in [place] and she should be home sometime on 11/26/20.pt said the glucophage 500 mg in AM and 1000 mg in PM and taking glipizide XL 10 mg in AM was not controlling BS. Pt said she does not know how to look up history on libre yet but FBS was averaging in 200's. About 2 wks ago pt stopped the glucophage in AM and continued taking the glipizide XL 10 mg in AM and was taking glucophage 500 mg in PM. Pt had Janumet and pt started taking Janumet 50-1000 mg one in AM and one in PM.  Pt said since taking janumet, glucophage and glipizide pts FBS has been around 140. The 2 hr after main meal BS is averaging between 120-130. Pt is not having any stomach upset or diarrhea since restarting the Janumet. Pt last visit was annual exam on 10/13/20.pt said since taking present regimen of the 3 meds pt is feeling really good. Pt also thinks she may have lost a few lbs. If pharmacy is needed and new med that needs to start to see how pt responds to med please send to [facility] [address]. If need rx sent in for long term send to [facility] mail order on pts. Pt request cb after reviewed by Anda Kraft. Sending note to Gentry Fitz NP and [facility] CMA. UC & ED precautions given and pt voiced understanding.

## 2020-11-26 MED ORDER — METFORMIN HCL ER 500 MG PO TB24: 500.0000 mg | ORAL_TABLET | Freq: Every day | ORAL | 3 refills | Status: DC

## 2020-11-26 MED ORDER — JANUMET 50-1000 MG PO TABS: 1.0000 | ORAL_TABLET | Freq: Two times a day (BID) | ORAL | 1 refills | Status: DC

## 2020-11-28 ENCOUNTER — Other Ambulatory Visit: Payer: Self-pay | Admitting: Primary Care

## 2020-11-28 DIAGNOSIS — E039 Hypothyroidism, unspecified: Secondary | ICD-10-CM

## 2020-12-22 ENCOUNTER — Other Ambulatory Visit: Payer: Self-pay | Admitting: Primary Care

## 2020-12-22 DIAGNOSIS — Z1231 Encounter for screening mammogram for malignant neoplasm of breast: Secondary | ICD-10-CM

## 2021-01-03 NOTE — Progress Notes (Signed)
Patient ID: Debra Perez, female   DOB: [birth year], 65 y.o.   MRN: [id number] Cardiology Office Note  Date:  01/04/2021   ID:  Debra Perez, DOB [birth year], MRN [id number]  PCP:  Pleas [name], NP   Chief Complaint  Patient presents with   Palpitations    Patient c/o shortness of breath  and palpitations/PVC's. Medications reviewed by the patient verbally.      HPI:  [name] is a pleasant 65 year old woman with history of  Diabetes II Paroxysmal atrial fibrillation, ChadsVASC 4 to 5 nonsustained VT/palpitations,  normal ejection fraction with EF greater than 60%,  mild three-vessel coronary artery disease on CTA scan,  20 years of smoking who stopped 13 years ago,  Upper respiratory infection February 2016  atrial fibrillation after inhaling albuterol while on prednisone,  evaluated in the hospital at that time with arrhythmia lasting for hours before converting back to normal sinus rhythm,  History of Obstructive sleep apnea, on CPAP Who presents for follow-up of her atrial fibrillation  LOV 06/2020 In follow-up today she reports having increasing frequency of symptomatic PVCs Uses her apple watch to record ectopy No rhyme or reason, could be in the day, could be overnight Seems to be happening more Takes metoprolol succinate 25 mg every day in the morning Occasionally with metoprolol tartrate  Denies any breakthrough atrial fibrillation Last clinic visit requested to stop her Eliquis Chart reviewed, only had 1 documented episode years ago  No regular exercise program  Labs: Reviewed on today's visit A1C 8.2 Total chol 136, LDL 58  Tolerating Crestor, zetia  EKG personally reviewed by myself on todays visit Shows No sinus rhythm rate 73 bpm nonspecific ST abnormality right bundle branch block, no change  Other past medical history reviewed CT scan done in 2016 again reviewed with him showing mild coronary artery disease calcification   atrial  fibrillation 01/16/2015 lasting at least 5 hours  metoprolol held for fatigue  multaq held for hair loss  started Rythmol 225 mg twice a day, SR dose  Cardiac CTA with mild three-vessel CAD, ectatic ascending aorta measuring 3.72 cm  PFTs with mild obstructive lung disease   PMH:   has a past medical history of Carotid artery occlusion, Diabetes mellitus without complication ([place]), Diverticulosis, GERD (gastroesophageal reflux disease), History of colonic polyps, Hyperlipidemia, Hypertension, Hypothyroidism, Lung infection, Mitral valve prolapse, NSVT (nonsustained ventricular tachycardia), Obstructive sleep apnea, PAF (paroxysmal atrial fibrillation) ([place]), and Sleep apnea.  PSH:    Past Surgical History:  Procedure Laterality Date   ABDOMINAL HYSTERECTOMY  2002   ovaries remain   CLAVICLE SURGERY  2007   right clavicle plate pins   COLONOSCOPY WITH PROPOFOL N/A 10/17/2017   Procedure: COLONOSCOPY WITH PROPOFOL;  Surgeon: Jonathon Bellows, MD;  Location: [facility] ENDOSCOPY;  Service: Gastroenterology;  Laterality: N/A;   ESOPHAGOGASTRODUODENOSCOPY (EGD) WITH PROPOFOL N/A 10/17/2017   Procedure: ESOPHAGOGASTRODUODENOSCOPY (EGD) WITH PROPOFOL;  Surgeon: Jonathon Bellows, MD;  [facility] END[facility]COPY;  Service: Gastroenterology;  Laterality: N/A;   GIVENS CAPSULE STUDY N/A 11/30/2017   Procedure: GIVENS CAPSULE STUDY;  Surgeon: Jonathon Bellows, MD;  Location: [facility] ENDOSCOPY;  Service: Gastroenterology;  Laterality: N/A;   KNEE ARTHROSCOPY  2012    Current Outpatient Medications  Medication Sig Dispense Refill   Accu-Chek FastClix Lancets MISC USE AS DIRECTED TO TEST BLOOD SUGAR 2 TIMES DAILY 204 each 3   [name] 50 MG tablet TAKE 1 TABLET BY MOUTH AT BEDTIME FOR BACK PAIN 90 tablet 3   b  complex vitamins capsule Take 1 capsule by mouth 2 (two) times daily.     Continuous Blood Gluc Receiver (FREESTYLE LIBRE 2 READER) DEVI Use daily with sensors to check blood sugar. 1 each 0   Continuous  Blood Gluc Sensor (FREESTYLE LIBRE 2 SENSOR) MISC Use daily to check blood sugars. 6 each 1   cyclobenzaprine (FLEXERIL) 5 MG tablet Take 1 tablet (5 mg total) by mouth 3 (three) times daily as needed for muscle spasms. 30 tablet 0   ezetimibe (ZETIA) 10 MG tablet TAKE 1 TABLET BY MOUTH DAILY GENERIC EQUIVALENT FOR ZETIA 90 tablet 3   famotidine (PEPCID) 20 MG tablet TAKE 1 TABLET BY MOUTH TWICE DAILY FOR HEARTBURN 180 tablet 3   fluticasone (FLONASE) 50 MCG/ACT nasal spray Place 1 spray into both nostrils 2 (two) times daily. 16 g 0   gentamicin ointment (GARAMYCIN) 0.1 % Apply 1 application topically daily as needed. For nasal sore. 15 g 0   glipiZIDE (GLUCOTROL XL) 10 MG 24 hr tablet Take 1 tablet (10 mg total) by mouth daily with breakfast. For diabetes. 90 tablet 3   hydrochlorothiazide (HYDRODIURIL) 25 MG tablet Take 1 tablet (25 mg total) by mouth daily. 90 tablet 0   Iron-Vitamin C 65-125 MG TABS Take by mouth daily.      levothyroxine (SYNTHROID) 88 MCG tablet TAKE 1 TABLET BY MOUTH EVERY MORNING WITH WATER ONLY. NO FOOD OR MEDICATIONS FOR 30 MINUTES 90 tablet 3   Magnesium 500 MG CAPS Take 1 capsule by mouth 2 (two) times daily.     metFORMIN (GLUCOPHAGE-XR) 500 MG 24 hr tablet Take 1 tablet (500 mg total) by mouth daily with breakfast. For diabetes 90 tablet 3   metoprolol succinate (TOPROL-XL) 25 MG 24 hr tablet Take 1 tablet (25 mg total) by mouth daily. Take with or immediately following a meal. 90 tablet 3   Multiple Vitamins-Minerals (MULTIVITAMIN ADULT PO) Take 1 capsule by mouth daily.      propafenone (RYTHMOL SR) 225 MG 12 hr capsule Take 1 capsule (225 mg total) by mouth 2 (two) times daily. 180 capsule 2   quinapril (ACCUPRIL) 20 MG tablet TAKE 1 TABLET BY MOUTH AT BEDTIME for blood pressure. 90 tablet 1   rosuvastatin (CRESTOR) 5 MG tablet TAKE 1 TABLET BY MOUTH DAILY GENERIC EQUIVALENT FOR CRESTOR 90 tablet 1   sitaGLIPtin-metformin (JANUMET) 50-1000 MG tablet Take 1 tablet  by mouth 2 (two) times daily with a meal. For diabetes. 180 tablet 1   venlafaxine XR (EFFEXOR-XR) 37.5 MG 24 hr capsule TAKE 1 CAPSULE(37.5MG  TOTAL) BY MOUTH DAILY WITH BREAKFAST for hot flashes. 90 capsule 3   verapamil (CALAN-SR) 240 MG CR tablet Take 1 tablet (240 mg total) by mouth daily. 90 tablet 3   No current facility-administered medications for this visit.   Facility-Administered Medications Ordered in Other Visits  Medication Dose Route Frequency Provider Last Rate Last Admin   heparin lock flush 100 unit/mL  500 Units Intracatheter Once PRN Earlie Server, MD       sodium chloride flush (NS) 0.9 % injection 10 mL  10 mL Intracatheter PRN Earlie Server, MD   10 mL at 10/06/17 1515     Allergies:   Patient has no known allergies.   Social History:  The patient  reports that she quit smoking about 16 years ago. Her smoking use included cigarettes. She has a 20.00 pack-year smoking history. She has never used smokeless tobacco. She reports that she does not drink alcohol  and does not use drugs.   Family History:   family history includes Arthritis in her father; Bladder Cancer in her maternal grandmother and sister; Breast cancer in her paternal grandmother; Cancer in her paternal grandfather; Depression in her mother; Diabetes in her brother, maternal grandfather, paternal grandfather, and sister; Emphysema in her brother and father; Heart attack in her mother; Heart disease in her brother and father; Hypertension in her brother and father; Sudden death (age of onset: 20) in her mother.    Review of Systems: Review of Systems  Constitutional: Negative.   HENT: Negative.    Respiratory: Negative.    Cardiovascular:  Positive for palpitations.  Gastrointestinal: Negative.   Musculoskeletal: Negative.   Neurological: Negative.   Psychiatric/Behavioral: Negative.    All other systems reviewed and are negative.   PHYSICAL EXAM: VS:  BP 140/80 (BP Location: Left Arm, Patient Position:  Sitting, Cuff Size: Normal)   Pulse 73   Ht 5\' 6"  (1.676 m)   Wt 181 lb 2 oz (82.2 kg)   SpO2 98%   BMI 29.23 kg/m  , BMI Body mass index is 29.23 kg/m. Constitutional:  oriented to person, place, and time. No distress.  HENT:  Head: Grossly normal Eyes:  no discharge. No scleral icterus.  Neck: No JVD, no carotid bruits  Cardiovascular: Regular rate and rhythm, no murmurs appreciated Pulmonary/Chest: Clear to auscultation bilaterally, no wheezes or rails Abdominal: Soft.  no distension.  no tenderness.  Musculoskeletal: Normal range of motion Neurological:  normal muscle tone. Coordination normal. No atrophy Skin: Skin warm and dry Psychiatric: normal affect, pleasant  Recent Labs: 10/13/2020: ALT 25; BUN 14; Creatinine, Ser 0.77; Hemoglobin 13.3; Platelets 261.0; Potassium 4.5; Sodium 135; TSH 0.86    Lipid Panel Lab Results  Component Value Date   CHOL 136 10/13/2020   HDL 58.40 10/13/2020   LDLCALC 58 10/13/2020   TRIG 97.0 10/13/2020      Wt Readings from Last 3 Encounters:  01/04/21 181 lb 2 oz (82.2 kg)  10/13/20 182 lb (82.6 kg)  09/10/20 179 lb (81.2 kg)     ASSESSMENT AND PLAN:  Paroxysmal atrial fibrillation (HCC) -  tolerating Rythmol  metoprol succinate 25 daily, verapamil Previously requested to stop Eliquis More frequent PVCs as detailed below  PVCs Discussed doing a Holter monitor to determine PVC burden, she would like to hold off for now Recommend for her symptomatic PVCs increase metoprolol succinate up to 25 twice daily Talked about increasing propafenone or trying alternate antiarrhythmic She will call us if symptoms persist  Essential hypertension -  Blood pressure is well controlled on today's visit. No changes made to the medications.  NSVT (nonsustained ventricular tachycardia) (HCC) Continue beta-blocker, verapamil and propafenone  Hyperlipidemia Statin and Zetia, numbers at goal  Type 2 diabetes mellitus with other circulatory  complication, without long-term current use of insulin ([place]) We have encouraged continued exercise, careful diet management in an effort to lose weight.    Total encounter time more than 25 minutes  Greater than 50% was spent in counseling and coordination of care with the patient     No orders of the defined types were placed in this encounter.    Signed, Esmond Plants, M.D., Ph.D. 01/04/2021  [place], [place]

## 2021-01-04 ENCOUNTER — Encounter: Payer: Self-pay | Admitting: Cardiovascular Disease

## 2021-01-04 ENCOUNTER — Other Ambulatory Visit: Payer: Self-pay

## 2021-01-04 ENCOUNTER — Ambulatory Visit (INDEPENDENT_AMBULATORY_CARE_PROVIDER_SITE_OTHER): Payer: Managed Care, Other (non HMO) | Admitting: Cardiovascular Disease

## 2021-01-04 VITALS — BP 140/80 | HR 73 | Ht 66.0 in | Wt 181.1 lb

## 2021-01-04 DIAGNOSIS — I480 Paroxysmal atrial fibrillation: Secondary | ICD-10-CM | POA: Diagnosis not present

## 2021-01-04 DIAGNOSIS — I2510 Atherosclerotic heart disease of native coronary artery without angina pectoris: Secondary | ICD-10-CM

## 2021-01-04 DIAGNOSIS — I471 Supraventricular tachycardia: Secondary | ICD-10-CM

## 2021-01-04 DIAGNOSIS — I77819 Aortic ectasia, unspecified site: Secondary | ICD-10-CM

## 2021-01-04 DIAGNOSIS — I700 Atherosclerosis of aorta: Secondary | ICD-10-CM

## 2021-01-04 DIAGNOSIS — I10 Essential (primary) hypertension: Secondary | ICD-10-CM

## 2021-01-04 DIAGNOSIS — E782 Mixed hyperlipidemia: Secondary | ICD-10-CM

## 2021-01-04 MED ORDER — METOPROLOL SUCCINATE ER 25 MG PO TB24: 25.0000 mg | ORAL_TABLET | Freq: Two times a day (BID) | ORAL | 3 refills | Status: DC

## 2021-01-04 NOTE — Patient Instructions (Addendum)
Medication Instructions:  Please increase the  metoprolol succinate up to twice a day  If you need a refill on your cardiac medications before your next appointment, please call your pharmacy.   Lab work: No new labs needed  Testing/Procedures: No new testing needed  Follow-Up: At [facility], you and your health needs are our priority.  As part of our continuing mission to provide you with exceptional heart care, we have created designated Provider Care Teams.  These Care Teams include your primary Cardiologist (physician) and Advanced Practice Providers (APPs -  Physician Assistants and Nurse Practitioners) who all work together to provide you with the care you need, when you need it.  You will need a follow up appointment in 12 months  Providers on your designated Care Team:   Murray Hodgkins, NP Christell Faith, PA-C Cadence Kathlen Mody, [place]   COVID-19 Vaccine Information can be found at: [email or web address].uk For questions related to vaccine distribution or appointments, please email [email or web address] or call [phone].

## 2021-01-18 ENCOUNTER — Telehealth: Payer: Self-pay | Admitting: *Deleted

## 2021-01-18 NOTE — Telephone Encounter (Signed)
It appears that this has been taken care of.

## 2021-01-18 NOTE — Telephone Encounter (Signed)
PLEASE NOTE: All timestamps contained within this report are represented as [place] Standard Time. CONFIDENTIALTY NOTICE: This fax transmission is intended only for the addressee. It contains information that is legally privileged, confidential or otherwise protected from use or disclosure. If you are not the intended recipient, you are strictly prohibited from reviewing, disclosing, copying using or disseminating any of this information or taking any action in reliance on or regarding this information. If you have received this fax in error, please notify us immediately by telephone so that we can arrange for its return to [place]. Phone: [phone], Toll-Free: [phone], Fax: [phone] Page: 1 of 1 Call Id: [id number] [place] Night - Client Nonclinical Telephone Record  [facility] Client [place] Night - Client Client Site [place] Physician Alma Friendly - NP Contact Type Call Who Is Calling Patient / Member / Family / Caregiver Caller Name Carpenter Phone Number [phone] Patient Name Debra Perez Patient DOB February 13, 1956 Call Type Message Only Information Provided Reason for Call Request to [facility] Appointment Initial Comment Caller states she is needing to cancel her appt. for tomorrow morning . Patient request to speak to RN No Additional Comment Office hours provided. Declined triage . Caller states cancellation is due to a family emergency. Disp. Time Disposition Final User 01/18/2021 7:59:18 AM General Information Provided Yes Mikki Santee Call Closed By: Mikki Santee Transaction Date/Time: 01/18/2021 7:56:14 AM (ET)

## 2021-01-19 ENCOUNTER — Ambulatory Visit: Payer: Managed Care, Other (non HMO) | Admitting: Primary Care

## 2021-01-20 ENCOUNTER — Other Ambulatory Visit: Payer: Self-pay

## 2021-01-20 ENCOUNTER — Ambulatory Visit
Admission: RE | Admit: 2021-01-20 | Discharge: 2021-01-20 | Disposition: A | Discharge status: Home / Self Care | Payer: Managed Care, Other (non HMO) | Source: Ambulatory Visit | Attending: Primary Care | Admitting: Primary Care

## 2021-01-20 DIAGNOSIS — Z1231 Encounter for screening mammogram for malignant neoplasm of breast: Secondary | ICD-10-CM | POA: Insufficient documentation

## 2021-02-05 ENCOUNTER — Encounter: Payer: Self-pay | Admitting: Primary Care

## 2021-02-05 ENCOUNTER — Ambulatory Visit (INDEPENDENT_AMBULATORY_CARE_PROVIDER_SITE_OTHER): Payer: Managed Care, Other (non HMO) | Admitting: Primary Care

## 2021-02-05 ENCOUNTER — Other Ambulatory Visit: Payer: Self-pay

## 2021-02-05 VITALS — BP 120/74 | HR 66 | Temp 97.5°F | Ht 66.0 in | Wt 181.5 lb

## 2021-02-05 DIAGNOSIS — E1159 Type 2 diabetes mellitus with other circulatory complications: Secondary | ICD-10-CM

## 2021-02-05 LAB — POCT GLYCOSYLATED HEMOGLOBIN (HGB A1C): HbA1c POC (<> result, manual entry): 6.6 % (ref 4.0–5.6)

## 2021-02-05 IMAGING — CT CT ANGIOGRAPHY CHEST
3 of 6 series · 18 of 36 positions shown · IV contrast (APPLIED)
Comparison: 12/04/2015

CLINICAL DATA: Sharp pain in the shoulder blades, history of known
aortic aneurysm

EXAM:
CT ANGIOGRAPHY CHEST WITH CONTRAST
TECHNIQUE: Multidetector CT imaging of the chest was performed using the
standard protocol during bolus administration of intravenous
contrast. Multiplanar CT image reconstructions and MIPs were
obtained to evaluate the vascular anatomy.
CONTRAST:  75mL OMNIPAQUE IOHEXOL 350 MG/ML SOLN

[Series 4: axial arterial · axial · arterial · 0.78mm/px · z∈[-398,-95]mm · 9 of 127 slices shown]
[im 13/127  lung]
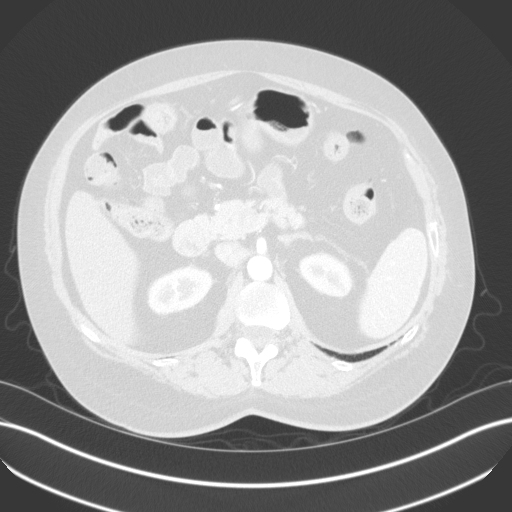
[im 26/127  mediastinal]
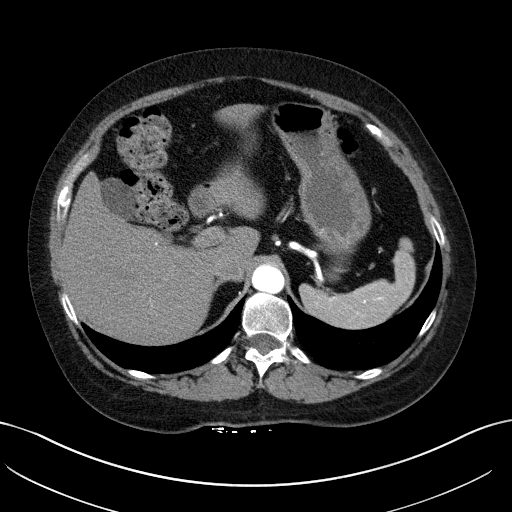
[im 38/127  lung]
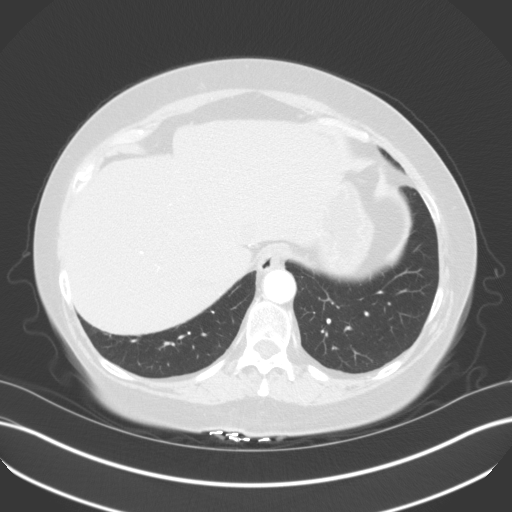
[im 51/127  mediastinal]
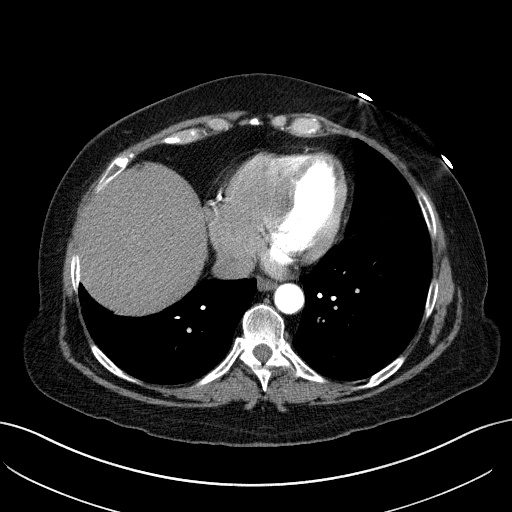
[im 64/127  lung]
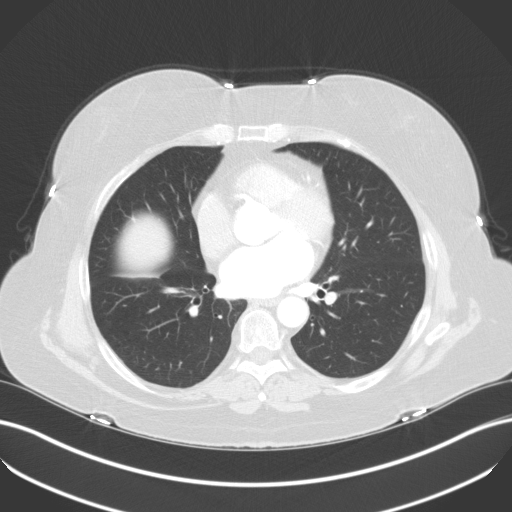
[im 76/127  mediastinal]
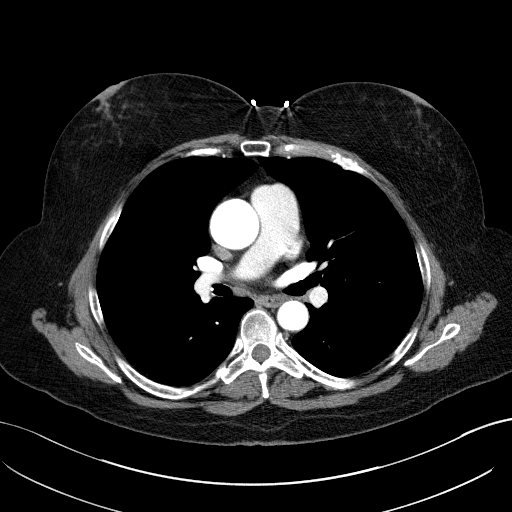
[im 89/127  lung]
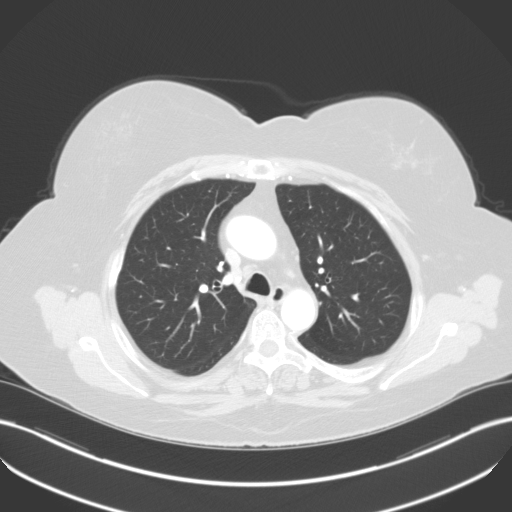
[im 101/127  mediastinal]
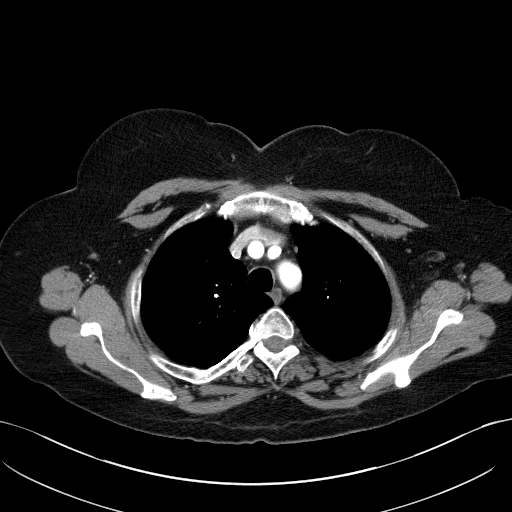
[im 114/127  lung]
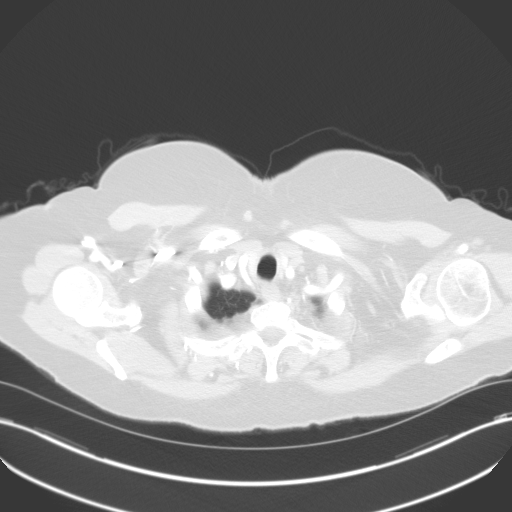

[Series 5: lung · axial · 0.78mm/px · z∈[-412,-80]mm · 8 of 190 slices shown]
[im 12/190  mediastinal]
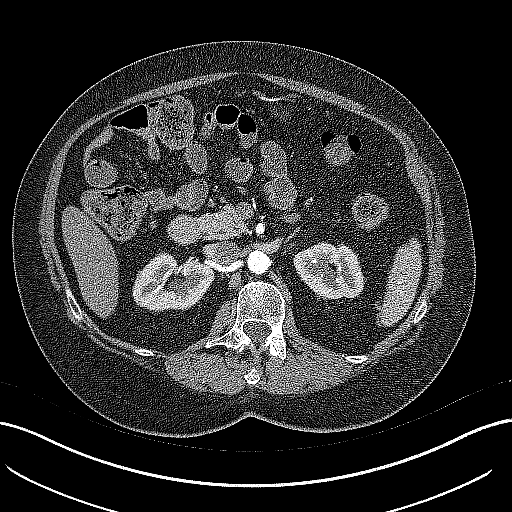
[im 36/190  mediastinal]
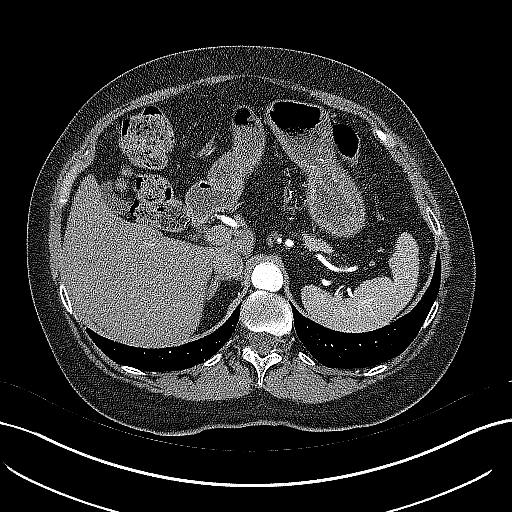
[im 60/190  mediastinal]
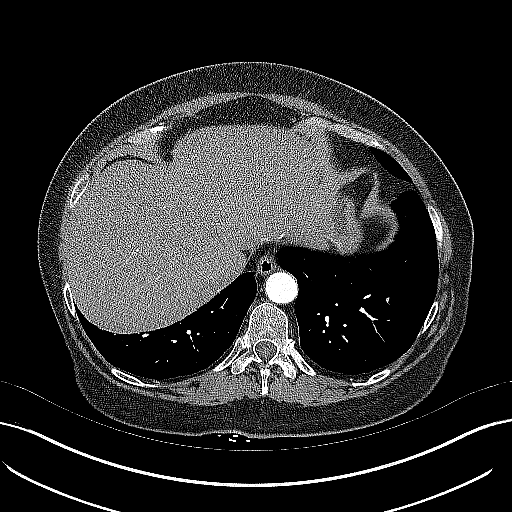
[im 83/190  mediastinal]
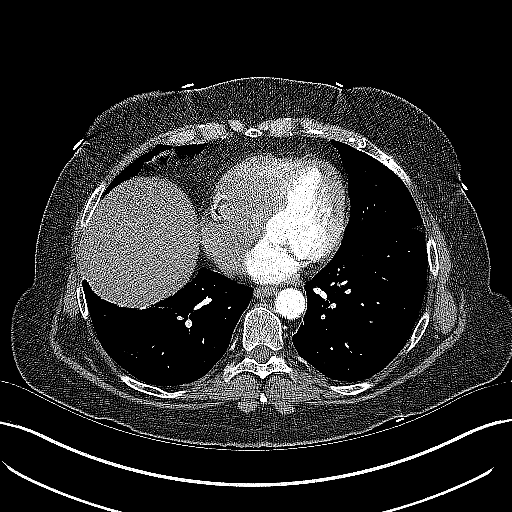
[im 107/190  mediastinal]
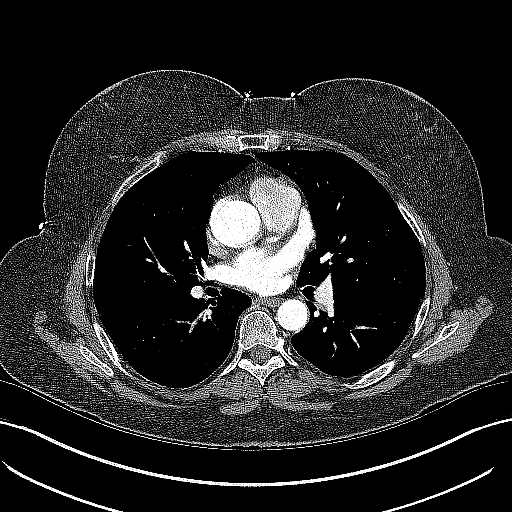
[im 130/190  mediastinal]
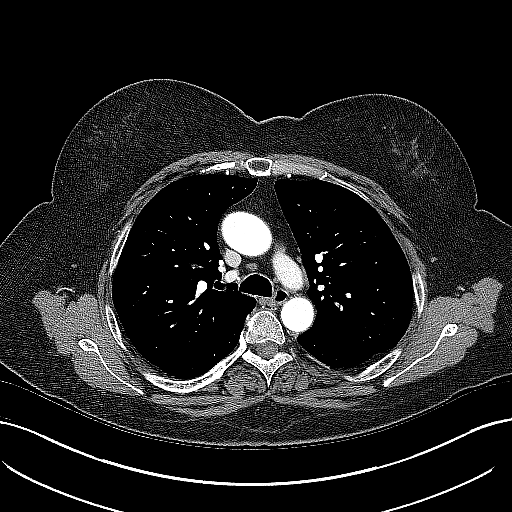
[im 154/190  mediastinal]
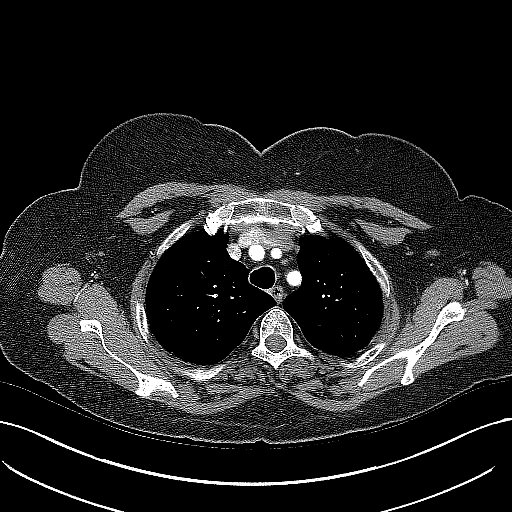
[im 178/190  mediastinal]
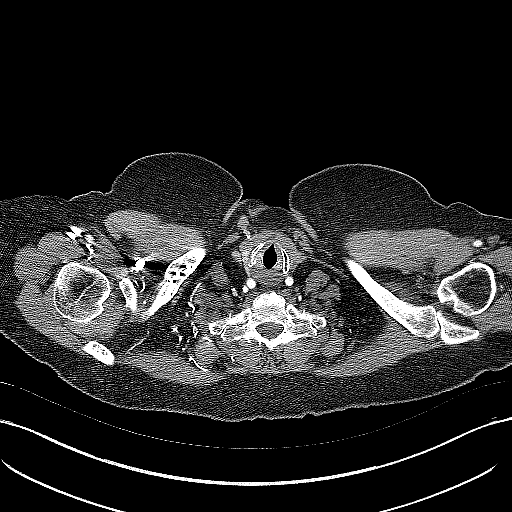

[Series 6: coronal · coronal · 0.71mm/px · 1 of 100 slices shown]
[im 50/100  mediastinal]
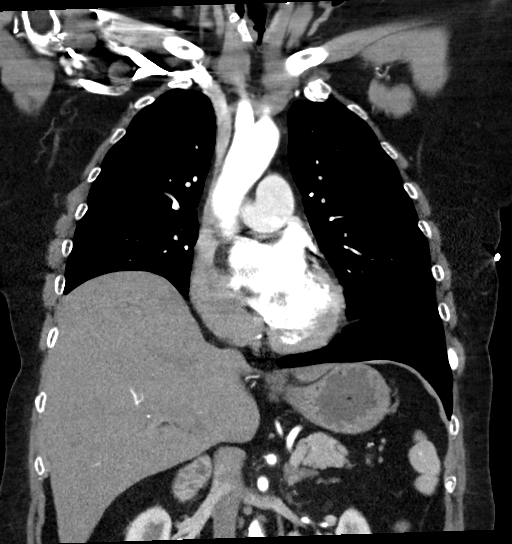

[18 of 36 positions shown; findings below may reference images not displayed]

FINDINGS: Cardiovascular: Preferential opacification of the thoracic aorta.
The tubular ascending thoracic aorta measures up to 3.8 x 3.6 cm in
caliber, unchanged from prior. Minimal atherosclerosis. Scattered
coronary artery calcifications. Normal heart size. No pericardial
effusion.

Mediastinum/Nodes: No enlarged mediastinal, hilar, or axillary lymph
nodes. Thyroid gland, trachea, and esophagus demonstrate no
significant findings.

Lungs/Pleura: Mild right basilar atelectasis or scarring. No pleural
effusion or pneumothorax.

Upper Abdomen: No acute abnormality.

Musculoskeletal: No chest wall abnormality. No acute or significant
osseous findings. Plate and screw fixation of the right clavicle.

Review of the MIP images confirms the above findings.
IMPRESSION: 1. The tubular ascending thoracic aorta measures up to 3.8 x 3.6 cm
in caliber, unchanged from prior. No evidence of aneurysm,
dissection, or other acute aortic pathology. Minimal
atherosclerosis.

2.  Coronary artery disease.

## 2021-02-05 NOTE — Patient Instructions (Signed)
Way to go [place]!!!  Please schedule a follow up visit for 6 months.  It was a pleasure to see you today!

## 2021-02-05 NOTE — Progress Notes (Signed)
Subjective:    Patient ID: Debra Perez, female    DOB: 1956-02-26, 65 y.o.   MRN: [id number]  HPI  Debra Perez is a very pleasant 65 y.o. female with a history of hypertension, paroxysmal atrial fibrillation, NSVT, type 2 diabetes, hypothyroidism, CAD, iron deficiency anemia who presents today for follow up of diabetes.  Current medications include: sitagliptin-metformin 50-1000 mg BID, metformin XR 500 mg daily, glipizide XL 10 mg daily.   She is checking her blood glucose numerous times daily and is getting readings averaging 139. She has the [employer] 2.   Last A1C: 8.2 in August 2022 Last Eye Exam: UTD Last Foot Exam: UTD Pneumonia Vaccination: 2021 Urine Microalbumin: None. On ACE-I Statin: rosuvastatin   Dietary changes since last visit: None. Tries to limit carbs and sugar.    Exercise: She is walking on the treadmill 30 minutes twice daily, 3 days weekly.    Review of Systems  Eyes:  Negative for visual disturbance.  Respiratory:  Negative for shortness of breath.   Cardiovascular:  Negative for chest pain.  Neurological:  Negative for numbness and headaches.        Past Medical History:  Diagnosis Date   Carotid artery occlusion    20 % left side   Diabetes mellitus without complication (HCC)    Diverticulosis    GERD (gastroesophageal reflux disease)    History of colonic polyps    benign   Hyperlipidemia    Hypertension    Hypothyroidism    Lung infection    Mitral valve prolapse    NSVT (nonsustained ventricular tachycardia)    Obstructive sleep apnea    on CPAP   PAF (paroxysmal atrial fibrillation) ([place])    a.  On Eliquis; b. CHADS2VASc => 3 (HTN, DM, female)   Sleep apnea     Social History   Socioeconomic History   Marital status: Married    Spouse name: Not on file   Number of children: Not on file   Years of education: Not on file   Highest education level: Not on file  Occupational History   Occupation: retired   Tobacco Use   Smoking status: Former    Packs/day: 1.00    Years: 20.00    Pack years: 20.00    Types: Cigarettes    Quit date: 03/07/2004    Years since quitting: 16.9   Smokeless tobacco: Never  Vaping Use   Vaping Use: Never used  Substance and Sexual Activity   Alcohol use: No    Alcohol/week: 0.0 standard drinks   Drug use: No   Sexual activity: Not on file  Other Topics Concern   Not on file  Social History Narrative   Married.   1 daughter.   Moved here from [place] due to husbands occupation.   She is a Corporate treasurer.   Enjoys sewing, gardening.   Social Determinants of Health   Financial Resource Strain: Not on file  Food Insecurity: Not on file  Transportation Needs: Not on file  Physical Activity: Not on file  Stress: Not on file  Social Connections: Not on file  Intimate Partner Violence: Not on file    Past Surgical History:  Procedure Laterality Date   ABDOMINAL HYSTERECTOMY  2002   ovaries remain   CLAVICLE SURGERY  2007   right clavicle plate pins   COLONOSCOPY WITH PROPOFOL N/A 10/17/2017   Procedure: COLONOSCOPY WITH PROPOFOL;  Surgeon: Jonathon Bellows, MD;  Location: [facility] ENDOSCOPY;  Service: Gastroenterology;  Laterality: N/A;   ESOPHAGOGASTRODUODENOSCOPY (EGD) WITH PROPOFOL N/A 10/17/2017   Procedure: ESOPHAGOGASTRODUODENOSCOPY (EGD) WITH PROPOFOL;  Surgeon: Jonathon Bellows, MD;  Location: [facility] ENDOSCOPY;  Service: Gastroenterology;  Laterality: N/A;   GIVENS CAPSULE STUDY N/A 11/30/2017   Procedure: GIVENS CAPSULE STUDY;  Surgeon: Jonathon Bellows, MD;  Location: [facility] ENDOSCOPY;  Service: Gastroenterology;  Laterality: N/A;   KNEE ARTHROSCOPY  2012    Family History  Problem Relation Age of Onset   Depression Mother    Sudden death Mother 12   Heart attack Mother    Arthritis Father    Heart disease Father    Hypertension Father    Emphysema Father    Bladder Cancer Sister    Diabetes Sister        Type 2   Diabetes Brother        type   Hypertension  Brother    Emphysema Brother    Heart disease Brother    Diabetes Maternal Grandfather    Bladder Cancer Maternal Grandmother        sinus cancer    Breast cancer Paternal Grandmother    Cancer Paternal Grandfather        colon cancer   Diabetes Paternal Grandfather     No Known Allergies  Current Outpatient Medications on File Prior to Visit  Medication Sig Dispense Refill   Accu-Chek FastClix Lancets MISC USE AS DIRECTED TO TEST BLOOD SUGAR 2 TIMES DAILY 204 each 3   [name] 50 MG tablet TAKE 1 TABLET BY MOUTH AT BEDTIME FOR BACK PAIN 90 tablet 3   b complex vitamins capsule Take 1 capsule by mouth 2 (two) times daily.     Continuous Blood Gluc Receiver (FREESTYLE LIBRE 2 READER) DEVI Use daily with sensors to check blood sugar. 1 each 0   Continuous Blood Gluc Sensor (FREESTYLE LIBRE 2 SENSOR) MISC Use daily to check blood sugars. 6 each 1   cyclobenzaprine (FLEXERIL) 5 MG tablet Take 1 tablet (5 mg total) by mouth 3 (three) times daily as needed for muscle spasms. 30 tablet 0   ezetimibe (ZETIA) 10 MG tablet TAKE 1 TABLET BY MOUTH DAILY GENERIC EQUIVALENT FOR ZETIA 90 tablet 3   famotidine (PEPCID) 20 MG tablet TAKE 1 TABLET BY MOUTH TWICE DAILY FOR HEARTBURN 180 tablet 3   fluticasone (FLONASE) 50 MCG/ACT nasal spray Place 1 spray into both nostrils 2 (two) times daily. 16 g 0   gentamicin ointment (GARAMYCIN) 0.1 % Apply 1 application topically daily as needed. For nasal sore. 15 g 0   glipiZIDE (GLUCOTROL XL) 10 MG 24 hr tablet Take 1 tablet (10 mg total) by mouth daily with breakfast. For diabetes. 90 tablet 3   Iron-Vitamin C 65-125 MG TABS Take by mouth daily.      levothyroxine (SYNTHROID) 88 MCG tablet TAKE 1 TABLET BY MOUTH EVERY MORNING WITH WATER ONLY. NO FOOD OR MEDICATIONS FOR 30 MINUTES 90 tablet 3   Magnesium 500 MG CAPS Take 1 capsule by mouth 2 (two) times daily.     metFORMIN (GLUCOPHAGE-XR) 500 MG 24 hr tablet Take 1 tablet (500 mg total) by mouth  daily with breakfast. For diabetes 90 tablet 3   metoprolol succinate (TOPROL-XL) 25 MG 24 hr tablet Take 1 tablet (25 mg total) by mouth 2 (two) times daily. Take with or immediately following a meal. 180 tablet 3   Multiple Vitamins-Minerals (MULTIVITAMIN ADULT PO) Take 1 capsule by mouth daily.      propafenone (RYTHMOL SR)  225 MG 12 hr capsule Take 1 capsule (225 mg total) by mouth 2 (two) times daily. 180 capsule 2   quinapril (ACCUPRIL) 20 MG tablet TAKE 1 TABLET BY MOUTH AT BEDTIME for blood pressure. 90 tablet 1   rosuvastatin (CRESTOR) 5 MG tablet TAKE 1 TABLET BY MOUTH DAILY GENERIC EQUIVALENT FOR CRESTOR 90 tablet 1   sitaGLIPtin-metformin (JANUMET) 50-1000 MG tablet Take 1 tablet by mouth 2 (two) times daily with a meal. For diabetes. 180 tablet 1   venlafaxine XR (EFFEXOR-XR) 37.5 MG 24 hr capsule TAKE 1 CAPSULE(37.5MG  TOTAL) BY MOUTH DAILY WITH BREAKFAST for hot flashes. 90 capsule 3   verapamil (CALAN-SR) 240 MG CR tablet Take 1 tablet (240 mg total) by mouth daily. 90 tablet 3   hydrochlorothiazide (HYDRODIURIL) 25 MG tablet Take 1 tablet (25 mg total) by mouth daily. 90 tablet 0   Current Facility-Administered Medications on File Prior to Visit  Medication Dose Route Frequency Provider Last Rate Last Admin   heparin lock flush 100 unit/mL  500 Units Intracatheter Once PRN Earlie Server, MD       sodium chloride flush (NS) 0.9 % injection 10 mL  10 mL Intracatheter PRN Earlie Server, MD   10 mL at 10/06/17 1515    BP 120/74   Pulse 66   Temp (!) 97.5 F (36.4 C) (Temporal)   Ht 5\' 6"  (1.676 m)   Wt 181 lb 8 oz (82.3 kg)   SpO2 96%   BMI 29.29 kg/m  Objective:   Physical Exam Cardiovascular:     Rate and Rhythm: Normal rate and regular rhythm.  Pulmonary:     Effort: Pulmonary effort is normal.     Breath sounds: Normal breath sounds.  Musculoskeletal:     Cervical back: Neck supple.  Skin:    General: Skin is warm and dry.          Assessment & Plan:      This  visit occurred during the SARS-CoV-2 public health emergency.  Safety protocols were in place, including screening questions prior to the visit, additional usage of staff PPE, and extensive cleaning of exam room while observing appropriate contact time as indicated for disinfecting solutions.

## 2021-02-05 NOTE — Assessment & Plan Note (Signed)
Significant improvement with A1C today of 6.6! Commended her on dietary changes.  Continue sitagliptin-metformin 50-1000 mg BID, metformin XR 500 mg daily, and glipizide XL 10 mg.  Foot and eye exams UTD. Managed on statin and ACE. Pneumonia vaccine UTD.  Follow up in 6 months.

## 2021-02-12 ENCOUNTER — Other Ambulatory Visit: Payer: Self-pay

## 2021-02-12 DIAGNOSIS — I10 Essential (primary) hypertension: Secondary | ICD-10-CM

## 2021-02-12 MED ORDER — QUINAPRIL HCL 20 MG PO TABS: ORAL_TABLET | ORAL | 2 refills | Status: DC

## 2021-02-12 MED ORDER — ROSUVASTATIN CALCIUM 5 MG PO TABS: ORAL_TABLET | ORAL | 2 refills | Status: DC

## 2021-02-21 ENCOUNTER — Other Ambulatory Visit: Payer: Self-pay | Admitting: Primary Care

## 2021-02-21 DIAGNOSIS — E1159 Type 2 diabetes mellitus with other circulatory complications: Secondary | ICD-10-CM

## 2021-02-22 ENCOUNTER — Other Ambulatory Visit: Payer: Self-pay | Admitting: *Deleted

## 2021-02-22 MED ORDER — HYDROCHLOROTHIAZIDE 25 MG PO TABS: 25.0000 mg | ORAL_TABLET | Freq: Every day | ORAL | 0 refills | Status: DC

## 2021-02-25 ENCOUNTER — Other Ambulatory Visit: Payer: Self-pay | Admitting: Primary Care

## 2021-02-25 DIAGNOSIS — E1159 Type 2 diabetes mellitus with other circulatory complications: Secondary | ICD-10-CM

## 2021-03-26 ENCOUNTER — Other Ambulatory Visit: Payer: Self-pay | Admitting: Cardiovascular Disease

## 2021-03-26 ENCOUNTER — Encounter: Payer: Self-pay | Admitting: Cardiovascular Disease

## 2021-03-26 MED ORDER — LISINOPRIL 20 MG PO TABS: 20.0000 mg | ORAL_TABLET | Freq: Every day | ORAL | 3 refills | Status: DC

## 2021-04-09 ENCOUNTER — Other Ambulatory Visit: Payer: Self-pay

## 2021-04-09 MED ORDER — PROPAFENONE HCL ER 225 MG PO CP12: 225.0000 mg | ORAL_CAPSULE | Freq: Two times a day (BID) | ORAL | 1 refills | Status: DC

## 2021-04-13 ENCOUNTER — Other Ambulatory Visit: Payer: Self-pay | Admitting: Primary Care

## 2021-04-13 DIAGNOSIS — R232 Flushing: Secondary | ICD-10-CM

## 2021-05-15 ENCOUNTER — Other Ambulatory Visit: Payer: Self-pay | Admitting: Primary Care

## 2021-05-15 DIAGNOSIS — E1159 Type 2 diabetes mellitus with other circulatory complications: Secondary | ICD-10-CM

## 2021-05-15 DIAGNOSIS — K219 Gastro-esophageal reflux disease without esophagitis: Secondary | ICD-10-CM

## 2021-05-17 ENCOUNTER — Other Ambulatory Visit: Payer: Self-pay

## 2021-05-17 MED ORDER — VERAPAMIL HCL ER 240 MG PO TBCR: 240.0000 mg | EXTENDED_RELEASE_TABLET | Freq: Every day | ORAL | 1 refills | Status: DC

## 2021-05-17 MED ORDER — HYDROCHLOROTHIAZIDE 25 MG PO TABS: 25.0000 mg | ORAL_TABLET | Freq: Every day | ORAL | 1 refills | Status: DC

## 2021-06-04 ENCOUNTER — Other Ambulatory Visit: Payer: Self-pay | Admitting: *Deleted

## 2021-06-04 MED ORDER — METOPROLOL SUCCINATE ER 25 MG PO TB24: 25.0000 mg | ORAL_TABLET | Freq: Two times a day (BID) | ORAL | 1 refills | Status: DC

## 2021-07-27 ENCOUNTER — Other Ambulatory Visit: Payer: Self-pay | Admitting: Primary Care

## 2021-07-27 DIAGNOSIS — E1159 Type 2 diabetes mellitus with other circulatory complications: Secondary | ICD-10-CM

## 2021-08-04 ENCOUNTER — Other Ambulatory Visit: Payer: Self-pay | Admitting: Emergency Medicine

## 2021-08-04 DIAGNOSIS — E782 Mixed hyperlipidemia: Secondary | ICD-10-CM

## 2021-08-04 MED ORDER — EZETIMIBE 10 MG PO TABS: ORAL_TABLET | ORAL | 3 refills | Status: DC

## 2021-08-06 ENCOUNTER — Ambulatory Visit: Payer: Managed Care, Other (non HMO) | Admitting: Primary Care

## 2021-08-20 ENCOUNTER — Ambulatory Visit (INDEPENDENT_AMBULATORY_CARE_PROVIDER_SITE_OTHER): Payer: Managed Care, Other (non HMO)

## 2021-08-20 ENCOUNTER — Ambulatory Visit (INDEPENDENT_AMBULATORY_CARE_PROVIDER_SITE_OTHER): Payer: Managed Care, Other (non HMO) | Admitting: Podiatry

## 2021-08-20 DIAGNOSIS — R52 Pain, unspecified: Secondary | ICD-10-CM

## 2021-08-20 DIAGNOSIS — M778 Other enthesopathies, not elsewhere classified: Secondary | ICD-10-CM

## 2021-08-20 MED ORDER — METHYLPREDNISOLONE 4 MG PO TBPK: ORAL_TABLET | ORAL | 0 refills | Status: DC

## 2021-08-20 MED ORDER — BETAMETHASONE SOD PHOS & ACET 6 (3-3) MG/ML IJ SUSP
3.0000 mg | Freq: Once | INTRAMUSCULAR | Status: AC
  Administered 2021-08-20: 3 mg via INTRA_ARTICULAR

## 2021-08-20 NOTE — Progress Notes (Signed)
   HPI: 66 y.o. female presenting today for new complaint of pain and tenderness to the left midfoot.  This is been ongoing for few months now.  She denies a history of injury.  Gradual onset.  Aggravated by activity and walking.  She was last seen in the office for right foot pain on 06/30/2020.  She says that her right foot is doing well.  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  On Eliquis; b. CHADS2VASc => 3 (HTN, DM, female)   Sleep apnea     Past Surgical History:  Procedure Laterality Date   ABDOMINAL HYSTERECTOMY  2002   ovaries remain   CLAVICLE SURGERY  2007   right clavicle plate pins   COLONOSCOPY WITH PROPOFOL N/A 10/17/2017   Procedure: COLONOSCOPY WITH PROPOFOL;  Surgeon: Jonathon Bellows, MD;  Location: [facility] ENDOSCOPY;  Service: Gastroenterology;  Laterality: N/A;   ESOPHAGOGASTRODUODENOSCOPY (EGD) WITH PROPOFOL N/A 10/17/2017   Procedure: ESOPHAGOGASTRODUODENOSCOPY (EGD) WITH PROPOFOL;  Surgeon: Jonathon Bellows, MD;  Location: [facility] ENDOSCOPY;  Service: Gastroenterology;  Laterality: N/A;   GIVENS CAPSULE STUDY N/A 11/30/2017   Procedure: GIVENS CAPSULE STUDY;  Surgeon: Jonathon Bellows, MD;  Location: [facility] ENDOSCOPY;  Service: Gastroenterology;  Laterality: N/A;   KNEE ARTHROSCOPY  2012    No Known Allergies   Physical Exam: General: The patient is alert and oriented x3 in no acute distress.  Dermatology: Skin is warm, dry and supple bilateral lower extremities. Negative for open lesions or macerations.  Vascular: Palpable pedal pulses bilaterally. Capillary  refill within normal limits.  Negative for any significant edema or erythema  Neurological: Light touch and protective threshold grossly intact  Musculoskeletal Exam: No pedal deformities noted.  There is pain on palpation throughout the left midtarsal joint of the foot.  Radiographic Exam:  Normal osseous mineralization. Joint spaces preserved. No fracture/dislocation/boney destruction.    Assessment: 1. Capsulitis left foot   Plan of Care:  1. Patient evaluated. X-Rays reviewed.  2. Injection of 0.5 cc Celestone Soluspan injected into the left midtarsal joint 3.  Prescription for Medrol Dosepak, then continue OTC ibuprofen as needed 4.  Continue wearing good supportive shoes and sneakers 5.  Return to clinic as needed      Edrick Kins, DPM [facility]  Dr. Edrick Kins, DPM    [address], [place] [address]                Office [phone]  Fax [phone]

## 2021-08-26 DIAGNOSIS — E1159 Type 2 diabetes mellitus with other circulatory complications: Secondary | ICD-10-CM

## 2021-08-26 MED ORDER — JANUMET 50-1000 MG PO TABS: 1.0000 | ORAL_TABLET | Freq: Two times a day (BID) | ORAL | 0 refills | Status: DC

## 2021-08-26 NOTE — Telephone Encounter (Signed)
Called patient reviewed medication with her. She is not sure where she go confused but has not taken the Janumet in several months. She was taking the metformin 500 xr 1 in am and 2 at night. She does have about a week on hand of the Janumet and will start taking one bid and go back to taking metformin xr at night in addition to glipizide. She will need refill on the Janumet called in to mail order soon. She will let us know if her numbers continue to stay elevated after changes.

## 2021-08-30 ENCOUNTER — Other Ambulatory Visit: Payer: Self-pay | Admitting: Cardiovascular Disease

## 2021-08-30 MED ORDER — HYDROCHLOROTHIAZIDE 25 MG PO TABS: 25.0000 mg | ORAL_TABLET | Freq: Every day | ORAL | 0 refills | Status: DC

## 2021-08-31 ENCOUNTER — Ambulatory Visit (INDEPENDENT_AMBULATORY_CARE_PROVIDER_SITE_OTHER): Payer: Managed Care, Other (non HMO) | Admitting: Primary Care

## 2021-08-31 ENCOUNTER — Encounter: Payer: Self-pay | Admitting: Primary Care

## 2021-08-31 VITALS — BP 124/62 | HR 72 | Temp 98.7°F | Ht 66.0 in | Wt 180.0 lb

## 2021-08-31 DIAGNOSIS — E1159 Type 2 diabetes mellitus with other circulatory complications: Secondary | ICD-10-CM

## 2021-08-31 LAB — POCT GLYCOSYLATED HEMOGLOBIN (HGB A1C): Hemoglobin A1C: 7.7 % — AB (ref 4.0–5.6)

## 2021-09-01 ENCOUNTER — Ambulatory Visit: Payer: Managed Care, Other (non HMO) | Admitting: Primary Care

## 2021-09-21 ENCOUNTER — Ambulatory Visit: Payer: Managed Care, Other (non HMO) | Admitting: Primary Care

## 2021-09-22 ENCOUNTER — Other Ambulatory Visit: Payer: Self-pay

## 2021-09-22 MED ORDER — PROPAFENONE HCL ER 225 MG PO CP12: 225.0000 mg | ORAL_CAPSULE | Freq: Two times a day (BID) | ORAL | 0 refills | Status: DC

## 2021-09-22 NOTE — Telephone Encounter (Signed)
Refill sent for Propafenone ER 225 mg

## 2021-09-24 ENCOUNTER — Other Ambulatory Visit: Payer: Self-pay | Admitting: Primary Care

## 2021-09-24 DIAGNOSIS — R232 Flushing: Secondary | ICD-10-CM

## 2021-10-18 ENCOUNTER — Other Ambulatory Visit: Payer: Self-pay | Admitting: *Deleted

## 2021-10-18 MED ORDER — ROSUVASTATIN CALCIUM 5 MG PO TABS: ORAL_TABLET | ORAL | 0 refills | Status: DC

## 2021-10-25 ENCOUNTER — Encounter: Payer: Self-pay | Admitting: Cardiovascular Disease

## 2021-10-26 ENCOUNTER — Other Ambulatory Visit: Payer: Self-pay

## 2021-10-26 ENCOUNTER — Other Ambulatory Visit: Payer: Self-pay | Admitting: Primary Care

## 2021-10-26 DIAGNOSIS — E039 Hypothyroidism, unspecified: Secondary | ICD-10-CM

## 2021-10-26 DIAGNOSIS — E1159 Type 2 diabetes mellitus with other circulatory complications: Secondary | ICD-10-CM

## 2021-10-26 DIAGNOSIS — K219 Gastro-esophageal reflux disease without esophagitis: Secondary | ICD-10-CM

## 2021-10-26 MED ORDER — VERAPAMIL HCL ER 240 MG PO TBCR: 240.0000 mg | EXTENDED_RELEASE_TABLET | Freq: Every day | ORAL | 0 refills | Status: DC

## 2021-11-15 DIAGNOSIS — E1159 Type 2 diabetes mellitus with other circulatory complications: Secondary | ICD-10-CM

## 2021-11-15 MED ORDER — FREESTYLE LIBRE 3 SENSOR MISC: 1 refills | Status: DC

## 2021-11-16 ENCOUNTER — Other Ambulatory Visit: Payer: Self-pay | Admitting: *Deleted

## 2021-11-16 ENCOUNTER — Other Ambulatory Visit: Payer: Self-pay | Admitting: Primary Care

## 2021-11-16 DIAGNOSIS — E1159 Type 2 diabetes mellitus with other circulatory complications: Secondary | ICD-10-CM

## 2021-11-16 MED ORDER — METOPROLOL SUCCINATE ER 25 MG PO TB24: 25.0000 mg | ORAL_TABLET | Freq: Two times a day (BID) | ORAL | 0 refills | Status: DC

## 2021-11-16 MED ORDER — HYDROCHLOROTHIAZIDE 25 MG PO TABS: 25.0000 mg | ORAL_TABLET | Freq: Every day | ORAL | 0 refills | Status: DC

## 2021-11-30 ENCOUNTER — Encounter: Payer: Self-pay | Admitting: Primary Care

## 2021-11-30 ENCOUNTER — Ambulatory Visit (INDEPENDENT_AMBULATORY_CARE_PROVIDER_SITE_OTHER): Payer: Managed Care, Other (non HMO) | Admitting: Primary Care

## 2021-11-30 VITALS — BP 122/78 | HR 59 | Temp 97.2°F | Ht 66.0 in | Wt 183.0 lb

## 2021-11-30 DIAGNOSIS — I10 Essential (primary) hypertension: Secondary | ICD-10-CM

## 2021-11-30 DIAGNOSIS — Z0000 Encounter for general adult medical examination without abnormal findings: Secondary | ICD-10-CM | POA: Diagnosis not present

## 2021-11-30 DIAGNOSIS — I4729 Other ventricular tachycardia: Secondary | ICD-10-CM

## 2021-11-30 DIAGNOSIS — Z23 Encounter for immunization: Secondary | ICD-10-CM | POA: Diagnosis not present

## 2021-11-30 DIAGNOSIS — E1159 Type 2 diabetes mellitus with other circulatory complications: Secondary | ICD-10-CM | POA: Diagnosis not present

## 2021-11-30 DIAGNOSIS — E039 Hypothyroidism, unspecified: Secondary | ICD-10-CM

## 2021-11-30 DIAGNOSIS — M5441 Lumbago with sciatica, right side: Secondary | ICD-10-CM

## 2021-11-30 DIAGNOSIS — G8929 Other chronic pain: Secondary | ICD-10-CM

## 2021-11-30 DIAGNOSIS — I25119 Atherosclerotic heart disease of native coronary artery with unspecified angina pectoris: Secondary | ICD-10-CM

## 2021-11-30 DIAGNOSIS — E782 Mixed hyperlipidemia: Secondary | ICD-10-CM

## 2021-11-30 DIAGNOSIS — R232 Flushing: Secondary | ICD-10-CM | POA: Diagnosis not present

## 2021-11-30 DIAGNOSIS — K219 Gastro-esophageal reflux disease without esophagitis: Secondary | ICD-10-CM

## 2021-11-30 LAB — COMPREHENSIVE METABOLIC PANEL
ALT: 26 U/L (ref 0–35)
AST: 17 U/L (ref 0–37)
Albumin: 4.3 g/dL (ref 3.5–5.2)
Alkaline Phosphatase: 42 U/L (ref 39–117)
BUN: 10 mg/dL (ref 6–23)
CO2: 31 mEq/L (ref 19–32)
Calcium: 9.8 mg/dL (ref 8.4–10.5)
Chloride: 94 mEq/L — ABNORMAL LOW (ref 96–112)
Creatinine, Ser: 0.83 mg/dL (ref 0.40–1.20)
GFR: 74.83 mL/min (ref >60.00)
Glucose, Bld: 130 mg/dL — ABNORMAL HIGH (ref 70–99)
Potassium: 4.7 mEq/L (ref 3.5–5.1)
Sodium: 131 mEq/L — ABNORMAL LOW (ref 135–145)
Total Bilirubin: 0.4 mg/dL (ref 0.2–1.2)
Total Protein: 7 g/dL (ref 6.0–8.3)

## 2021-11-30 LAB — LIPID PANEL
Cholesterol: 116 mg/dL (ref 0–200)
HDL: 55.9 mg/dL (ref >39.00)
LDL Cholesterol: 42 mg/dL (ref 0–99)
NonHDL: 59.87
Total CHOL/HDL Ratio: 2
Triglycerides: 87 mg/dL (ref 0.0–149.0)
VLDL: 17.4 mg/dL (ref 0.0–40.0)

## 2021-11-30 LAB — MICROALBUMIN / CREATININE URINE RATIO
Creatinine,U: 53.1 mg/dL
Microalb Creat Ratio: 2 mg/g (ref 0.0–30.0)
Microalb, Ur: 1.1 mg/dL (ref 0.0–1.9)

## 2021-11-30 LAB — HEMOGLOBIN A1C: Hgb A1c MFr Bld: 7.3 % — ABNORMAL HIGH (ref 4.6–6.5)

## 2021-11-30 LAB — TSH: TSH: 0.96 u[IU]/mL (ref 0.35–5.50)

## 2021-11-30 NOTE — Assessment & Plan Note (Signed)
Immunizations UTD. Mammogram due in November 2023. Colonoscopy up-to-date, due 2024.  Discussed the importance of a healthy diet and regular exercise in order for weight loss, and to reduce the risk of further co-morbidity.  Exam stable. Labs pending.  Follow up in 1 year for repeat physical.

## 2021-11-30 NOTE — Assessment & Plan Note (Signed)
Asymptomatic. Following with cardiology, office notes reviewed from October 2022.  Continue BP control, lipid control, diabetes control.

## 2021-11-30 NOTE — Assessment & Plan Note (Addendum)
Intermittent symptoms, also with PVCs. Following with cardiology, office notes reviewed from October 2022.  Continue verapamil ER 240 mg daily, metoprolol succinate 25 mg 2 times daily, Rythmol SR 225 twice daily.

## 2021-11-30 NOTE — Assessment & Plan Note (Signed)
Controlled.  Continue venlafaxine ER 37.5 mg daily.

## 2021-11-30 NOTE — Assessment & Plan Note (Addendum)
Numerous episodes of hypoglycemia.  Discontinue metformin XR 500 mg daily. Continue glipizide XL 10 mg daily, consider dose reduction if hypoglycemia persists. Continue Sitagliptin-metformin, 50-1000 mg twice daily.  Discussed to work on diet, regular exercise.  Repeat A1c pending. Follow-up in 3 to 6 months.

## 2021-11-30 NOTE — Assessment & Plan Note (Signed)
Repeat lipid panel pending.  Continue Zetia 10 mg daily, rosuvastatin 5 mg daily.

## 2021-11-30 NOTE — Assessment & Plan Note (Signed)
She is taking magnesium within 4 hours of levothyroxine, discussed correct administration.  She would also like to switch from levothyroxine to Synthroid. Repeat TSH pending today.  Agree to switch to Synthroid. Continue levothyroxine 88 mcg for now.

## 2021-11-30 NOTE — Assessment & Plan Note (Signed)
Controlled. Continue famotidine 20 mg twice daily.

## 2021-11-30 NOTE — Assessment & Plan Note (Addendum)
Controlled.  Continue metoprolol succinate 25 mg BID, lisinopril 20 mg daily, HCTZ 25 mg daily, verapamil SR 240 milligrams daily. CMP pending.

## 2021-11-30 NOTE — Progress Notes (Signed)
Subjective:    Patient ID: Debra Perez, female    DOB: 1955-12-06, 66 y.o.   MRN: [id number]  HPI  Debra Perez is a very pleasant 66 y.o. female who presents today for complete physical and follow up of chronic conditions.  Immunizations: -Tetanus: 2016 -Influenza: Due today -Covid-19: 5 vaccines -Shingles: Completed Shingrix -Pneumonia: 2021  Diet: Fair diet.  Exercise: No regular exercise.  Eye exam: Completes annually  Dental exam: Completes semi-annually   Pap Smear: Hysterectomy   Mammogram: Completed in November 2022  Colonoscopy: Completed in 2019, due 2024  BP Readings from Last 3 Encounters:  11/30/21 122/78  08/31/21 124/62  02/05/21 120/74        Review of Systems  Constitutional:  Negative for unexpected weight change.  HENT:  Negative for rhinorrhea.   Respiratory:  Negative for cough and shortness of breath.   Cardiovascular:  Negative for chest pain.  Gastrointestinal:  Negative for constipation and diarrhea.  Genitourinary:  Negative for difficulty urinating.  Musculoskeletal:  Positive for arthralgias and back pain.  Skin:  Negative for rash.  Allergic/Immunologic: Negative for environmental allergies.  Neurological:  Negative for dizziness and headaches.  Psychiatric/Behavioral:  The patient is nervous/anxious.          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  On Eliquis; b. CHADS2VASc => 3 (HTN, DM, female)   Sleep apnea     Social History   Socioeconomic History   Marital status: Married     Spouse name: Not on file   Number of children: Not on file   Years of education: Not on file   Highest education level: Not on file  Occupational History   Occupation: retired  Tobacco Use   Smoking status: Former    Packs/day: 1.00    Years: 20.00    Total pack years: 20.00    Types: Cigarettes    Quit date: 03/07/2004    Years since quitting: 17.7   Smokeless tobacco: Never  Vaping Use   Vaping Use: Never used  Substance and Sexual Activity   Alcohol use: No    Alcohol/week: 0.0 standard drinks of alcohol   Drug use: No   Sexual activity: Not on file  Other Topics Concern   Not on file  Social History Narrative   Married.   1 daughter.   Moved here from [place] due to husbands occupation.   She is a Corporate treasurer.   Enjoys sewing, gardening.   Social Determinants of Health   Financial Resource Strain: Not on file  Food Insecurity: Not on file  Transportation Needs: Not on file  Physical Activity: Not on file  Stress: Not on file  Social Connections: Not on file  Intimate Partner Violence: Not on file    Past Surgical History:  Procedure Laterality Date   ABDOMINAL HYSTERECTOMY  2002   ovaries remain   CLAVICLE SURGERY  2007   right clavicle plate pins   COLONOSCOPY WITH  PROPOFOL N/A 10/17/2017   Procedure: COLONOSCOPY WITH PROPOFOL;  Surgeon: Jonathon Bellows, MD;  Location: [facility] ENDOSCOPY;  Service: Gastroenterology;  Laterality: N/A;   ESOPHAGOGASTRODUODENOSCOPY (EGD) WITH PROPOFOL N/A 10/17/2017   Procedure: ESOPHAGOGASTRODUODENOSCOPY (EGD) WITH PROPOFOL;  Surgeon: Jonathon Bellows, MD;  Location: [facility] ENDOSCOPY;  Service: Gastroenterology;  Laterality: N/A;   GIVENS CAPSULE STUDY N/A 11/30/2017   Procedure: GIVENS CAPSULE STUDY;  Surgeon: Jonathon Bellows, MD;  Location: [facility] ENDOSCOPY;  Service: Gastroenterology;  Laterality: N/A;   KNEE ARTHROSCOPY  2012    Family History  Problem Relation Age of Onset   Depression Mother    Sudden death Mother 35   Heart attack Mother     Arthritis Father    Heart disease Father    Hypertension Father    Emphysema Father    Bladder Cancer Sister    Diabetes Sister        Type 2   Diabetes Brother        type   Hypertension Brother    Emphysema Brother    Heart disease Brother    Diabetes Maternal Grandfather    Bladder Cancer Maternal Grandmother        sinus cancer    Breast cancer Paternal Grandmother    Cancer Paternal Grandfather        colon cancer   Diabetes Paternal Grandfather     No Known Allergies  Current Outpatient Medications on File Prior to Visit  Medication Sig Dispense Refill   Accu-Chek FastClix Lancets MISC USE TO TEST BLOOD SUGAR TWICE DAILY AS DIRECTED 204 each 1   [name] 50 MG tablet TAKE 1 TABLET BY MOUTH AT BEDTIME FOR BACK PAIN 90 tablet 3   b complex vitamins capsule Take 1 capsule by mouth 2 (two) times daily.     Continuous Blood Gluc Sensor (FREESTYLE LIBRE 3 SENSOR) MISC Place 1 sensor on the skin every 14 days. Use to check glucose continuously 6 each 1   ezetimibe (ZETIA) 10 MG tablet TAKE 1 TABLET BY MOUTH DAILY GENERIC EQUIVALENT FOR ZETIA 90 tablet 3   famotidine (PEPCID) 20 MG tablet TAKE 1 TABLET BY MOUTH TWICE DAILY FOR HEARTBURN 180 tablet 0   fluticasone (FLONASE) 50 MCG/ACT nasal spray Place 1 spray into both nostrils 2 (two) times daily. 16 g 0   gentamicin ointment (GARAMYCIN) 0.1 % Apply 1 application topically daily as needed. For nasal sore. 15 g 0   glipiZIDE (GLUCOTROL XL) 10 MG 24 hr tablet TAKE 1 TABLET BY MOUTH DAILY WITH BREAKFAST FOR DIABETES 90 tablet 0   hydrochlorothiazide (HYDRODIURIL) 25 MG tablet Take 1 tablet (25 mg total) by mouth daily. Please schedule yearly appointment for October 2023 for future refills. Thank you 90 tablet 0   Iron-Vitamin C 65-125 MG TABS Take by mouth daily.      JANUMET 50-1000 MG tablet TAKE 1 TABLET BY MOUTH 2 TIMES DAILY WITH A MEAL FOR DIABETES 180 tablet 0   levothyroxine (SYNTHROID) 88 MCG tablet TAKE 1  TABLET BY MOUTH EVERY MORNING WITH WATER ONLY, NO FOOD OR MEDICATIONS FOR 30 MINUTES 90 tablet 0   lisinopril (ZESTRIL) 20 MG tablet Take 1 tablet (20 mg total) by mouth daily. 90 tablet 3   Magnesium 500 MG CAPS Take 1 capsule by mouth 2 (two) times daily.     metFORMIN (GLUCOPHAGE-XR) 500 MG 24 hr tablet Take 1 tablet (500 mg total) by mouth daily with breakfast. For diabetes. 90 tablet 3   metoprolol succinate (  TOPROL-XL) 25 MG 24 hr tablet Take 1 tablet (25 mg total) by mouth 2 (two) times daily. Take with or immediately following a meal. Please schedule yearly appointment for October to receive future refills. Thank you. 120 tablet 0   Multiple Vitamins-Minerals (MULTIVITAMIN ADULT PO) Take 1 capsule by mouth daily.      propafenone (RYTHMOL SR) 225 MG 12 hr capsule Take 1 capsule (225 mg total) by mouth 2 (two) times daily. 180 capsule 0   rosuvastatin (CRESTOR) 5 MG tablet TAKE 1 TABLET BY MOUTH DAILY GENERIC EQUIVALENT FOR CRESTOR 90 tablet 0   venlafaxine XR (EFFEXOR-XR) 37.5 MG 24 hr capsule TAKE ONE CAPSULE BY MOUTH DAILY WITH BREAKFAST FOR HOT FLASHES 90 capsule 0   verapamil (CALAN-SR) 240 MG CR tablet Take 1 tablet (240 mg total) by mouth daily. 90 tablet 0   Current Facility-Administered Medications on File Prior to Visit  Medication Dose Route Frequency Provider Last Rate Last Admin   heparin lock flush 100 unit/mL  500 Units Intracatheter Once PRN Earlie Server, MD       sodium chloride flush (NS) 0.9 % injection 10 mL  10 mL Intracatheter PRN Earlie Server, MD   10 mL at 10/06/17 1515    BP 122/78   Pulse (!) 59   Temp (!) 97.2 F (36.2 C) (Temporal)   Ht '5\' 6"'$  (1.676 m)   Wt 183 lb (83 kg)   SpO2 96%   BMI 29.54 kg/m  Objective:   Physical Exam HENT:     Right Ear: Tympanic membrane and ear canal normal.     Left Ear: Tympanic membrane and ear canal normal.     Nose: Nose normal.  Eyes:     Conjunctiva/sclera: Conjunctivae normal.     Pupils: Pupils are equal, round, and  reactive to light.  Neck:     Thyroid: No thyromegaly.  Cardiovascular:     Rate and Rhythm: Normal rate and regular rhythm.     Heart sounds: No murmur heard. Pulmonary:     Effort: Pulmonary effort is normal.     Breath sounds: Normal breath sounds. No rales.  Abdominal:     General: Bowel sounds are normal.     Palpations: Abdomen is soft.     Tenderness: There is no abdominal tenderness.  Musculoskeletal:        General: Normal range of motion.     Cervical back: Neck supple.  Lymphadenopathy:     Cervical: No cervical adenopathy.  Skin:    General: Skin is warm and dry.     Findings: No rash.  Neurological:     Mental Status: She is alert and oriented to person, place, and time.     Cranial Nerves: No cranial nerve deficit.     Deep Tendon Reflexes: Reflexes are normal and symmetric.  Psychiatric:        Mood and Affect: Mood normal.           Assessment & Plan:   Problem List Items Addressed This Visit       Cardiovascular and Mediastinum   Essential hypertension    Controlled.  Continue metoprolol succinate 25 mg BID, lisinopril 20 mg daily, HCTZ 25 mg daily, verapamil SR 240 milligrams daily. CMP pending.      Hot flashes    Controlled.  Continue venlafaxine ER 37.5 mg daily.      NSVT (nonsustained ventricular tachycardia) (HCC)    Intermittent symptoms, also with PVCs. Following with cardiology, office  notes reviewed from October 2022.  Continue verapamil ER 240 mg daily, metoprolol succinate 25 mg 2 times daily, Rythmol SR 225 twice daily.      Coronary artery disease involving native coronary artery of native heart with angina pectoris (HCC)    Asymptomatic. Following with cardiology, office notes reviewed from October 2022.  Continue BP control, lipid control, diabetes control.        Digestive   GERD (gastroesophageal reflux disease)    Controlled. Continue famotidine 20 mg twice daily.         Endocrine   Type 2 diabetes  mellitus (HCC)    Numerous episodes of hypoglycemia.  Discontinue metformin XR 500 mg daily. Continue glipizide XL 10 mg daily, consider dose reduction if hypoglycemia persists. Continue Sitagliptin-metformin, 50-1000 mg twice daily.  Discussed to work on diet, regular exercise.  Repeat A1c pending. Follow-up in 3 to 6 months.      Relevant Orders   Microalbumin/Creatinine Ratio, Urine   Hemoglobin A1c   Hypothyroidism    She is taking magnesium within 4 hours of levothyroxine, discussed correct administration.  She would also like to switch from levothyroxine to Synthroid. Repeat TSH pending today.  Agree to switch to Synthroid. Continue levothyroxine 88 mcg for now.      Relevant Orders   TSH     Other   Mixed hyperlipidemia    Repeat lipid panel pending.  Continue Zetia 10 mg daily, rosuvastatin 5 mg daily.      Relevant Orders   Lipid panel   Comprehensive metabolic panel   Preventative health care - Primary    Immunizations UTD. Mammogram due in November 2023. Colonoscopy up-to-date, due 2024.  Discussed the importance of a healthy diet and regular exercise in order for weight loss, and to reduce the risk of further co-morbidity.  Exam stable. Labs pending.  Follow up in 1 year for repeat physical.       Chronic back pain    Controlled.  Following with physical medicine. Continue amitriptyline 50 mg at bedtime.      Other Visit Diagnoses     Need for immunization against influenza       Relevant Orders   Flu Vaccine QUAD 43moIM (Fluarix, Fluzone & Alfiuria Quad PF) (Completed)          KPleas [name] NP

## 2021-11-30 NOTE — Patient Instructions (Signed)
Stop by the lab prior to leaving today. I will notify you of your results once received.   Stop metformin ER 500 mg daily for diabetes.  Please notify me if your blood sugars for continue to drop consistently below 90.  Please schedule a follow up visit for 6 months for a diabetes check.  It was a pleasure to see you today!   Preventive Care 30-64 Years Old, Female Preventive care refers to lifestyle choices and visits with your health care provider that can promote health and wellness. Preventive care visits are also called wellness exams. What can I expect for my preventive care visit? Counseling Your health care provider may ask you questions about your: Medical history, including: Past medical problems. Family medical history. Pregnancy history. Current health, including: Menstrual cycle. Method of birth control. Emotional well-being. Home life and relationship well-being. Sexual activity and sexual health. Lifestyle, including: Alcohol, nicotine or tobacco, and drug use. Access to firearms. Diet, exercise, and sleep habits. Work and work Statistician. Sunscreen use. Safety issues such as seatbelt and bike helmet use. Physical exam Your health care provider will check your: Height and weight. These may be used to calculate your BMI (body mass index). BMI is a measurement that tells if you are at a healthy weight. Waist circumference. This measures the distance around your waistline. This measurement also tells if you are at a healthy weight and may help predict your risk of certain diseases, such as type 2 diabetes and high blood pressure. Heart rate and blood pressure. Body temperature. Skin for abnormal spots. What immunizations do I need?  Vaccines are usually given at various ages, according to a schedule. Your health care provider will recommend vaccines for you based on your age, medical history, and lifestyle or other factors, such as travel or where you  work. What tests do I need? Screening Your health care provider may recommend screening tests for certain conditions. This may include: Lipid and cholesterol levels. Diabetes screening. This is done by checking your blood sugar (glucose) after you have not eaten for a while (fasting). Pelvic exam and Pap test. Hepatitis B test. Hepatitis C test. HIV (human immunodeficiency virus) test. STI (sexually transmitted infection) testing, if you are at risk. Lung cancer screening. Colorectal cancer screening. Mammogram. Talk with your health care provider about when you should start having regular mammograms. This may depend on whether you have a family history of breast cancer. BRCA-related cancer screening. This may be done if you have a family history of breast, ovarian, tubal, or peritoneal cancers. Bone density scan. This is done to screen for osteoporosis. Talk with your health care provider about your test results, treatment options, and if necessary, the need for more tests. Follow these instructions at home: Eating and drinking  Eat a diet that includes fresh fruits and vegetables, whole grains, lean protein, and low-fat dairy products. Take vitamin and mineral supplements as recommended by your health care provider. Do not drink alcohol if: Your health care provider tells you not to drink. You are pregnant, may be pregnant, or are planning to become pregnant. If you drink alcohol: Limit how much you have to 0-1 drink a day. Know how much alcohol is in your drink. In the [place] one drink equals one 12 oz bottle of beer (355 mL), one 5 oz glass of wine (148 mL), or one 1 oz glass of hard liquor (44 mL). Lifestyle Brush your teeth every morning and night with fluoride toothpaste. Floss one time  each day. Exercise for at least 30 minutes 5 or more days each week. Do not use any products that contain nicotine or tobacco. These products include cigarettes, chewing tobacco, and vaping  devices, such as e-cigarettes. If you need help quitting, ask your health care provider. Do not use drugs. If you are sexually active, practice safe sex. Use a condom or other form of protection to prevent STIs. If you do not wish to become pregnant, use a form of birth control. If you plan to become pregnant, see your health care provider for a prepregnancy visit. Take aspirin only as told by your health care provider. Make sure that you understand how much to take and what form to take. Work with your health care provider to find out whether it is safe and beneficial for you to take aspirin daily. Find healthy ways to manage stress, such as: Meditation, yoga, or listening to music. Journaling. Talking to a trusted person. Spending time with friends and family. Minimize exposure to UV radiation to reduce your risk of skin cancer. Safety Always wear your seat belt while driving or riding in a vehicle. Do not drive: If you have been drinking alcohol. Do not ride with someone who has been drinking. When you are tired or distracted. While texting. If you have been using any mind-altering substances or drugs. Wear a helmet and other protective equipment during sports activities. If you have firearms in your house, make sure you follow all gun safety procedures. Seek help if you have been physically or sexually abused. What's next? Visit your health care provider once a year for an annual wellness visit. Ask your health care provider how often you should have your eyes and teeth checked. Stay up to date on all vaccines. This information is not intended to replace advice given to you by your health care provider. Make sure you discuss any questions you have with your health care provider. Document Revised: 08/19/2020 Document Reviewed: 08/19/2020 Elsevier Patient Education  [place].

## 2021-11-30 NOTE — Assessment & Plan Note (Signed)
Controlled.  Following with physical medicine. Continue amitriptyline 50 mg at bedtime.

## 2021-12-01 ENCOUNTER — Other Ambulatory Visit: Payer: Self-pay | Admitting: Primary Care

## 2021-12-01 DIAGNOSIS — E871 Hypo-osmolality and hyponatremia: Secondary | ICD-10-CM

## 2021-12-03 ENCOUNTER — Other Ambulatory Visit (INDEPENDENT_AMBULATORY_CARE_PROVIDER_SITE_OTHER): Payer: Managed Care, Other (non HMO)

## 2021-12-03 DIAGNOSIS — E871 Hypo-osmolality and hyponatremia: Secondary | ICD-10-CM

## 2021-12-03 DIAGNOSIS — E039 Hypothyroidism, unspecified: Secondary | ICD-10-CM

## 2021-12-03 LAB — BASIC METABOLIC PANEL
BUN: 14 mg/dL (ref 6–23)
CO2: 30 mEq/L (ref 19–32)
Calcium: 9.2 mg/dL (ref 8.4–10.5)
Chloride: 95 mEq/L — ABNORMAL LOW (ref 96–112)
Creatinine, Ser: 0.94 mg/dL (ref 0.40–1.20)
GFR: 64.45 mL/min (ref >60.00)
Glucose, Bld: 150 mg/dL — ABNORMAL HIGH (ref 70–99)
Potassium: 4.5 mEq/L (ref 3.5–5.1)
Sodium: 132 mEq/L — ABNORMAL LOW (ref 135–145)

## 2021-12-03 MED ORDER — SYNTHROID 88 MCG PO TABS: ORAL_TABLET | ORAL | 3 refills | Status: DC

## 2021-12-13 ENCOUNTER — Other Ambulatory Visit: Payer: Self-pay

## 2021-12-13 MED ORDER — PROPAFENONE HCL ER 225 MG PO CP12: 225.0000 mg | ORAL_CAPSULE | Freq: Two times a day (BID) | ORAL | 0 refills | Status: DC

## 2021-12-18 ENCOUNTER — Other Ambulatory Visit: Payer: Self-pay | Admitting: Primary Care

## 2021-12-18 DIAGNOSIS — R232 Flushing: Secondary | ICD-10-CM

## 2021-12-19 ENCOUNTER — Encounter: Payer: Self-pay | Admitting: Cardiovascular Disease

## 2021-12-23 ENCOUNTER — Ambulatory Visit (INDEPENDENT_AMBULATORY_CARE_PROVIDER_SITE_OTHER): Payer: Managed Care, Other (non HMO) | Admitting: Pulmonary Disease

## 2021-12-23 ENCOUNTER — Encounter: Payer: Self-pay | Admitting: Pulmonary Disease

## 2021-12-23 VITALS — BP 112/68 | HR 65 | Ht 66.0 in | Wt 183.4 lb

## 2021-12-23 DIAGNOSIS — G4733 Obstructive sleep apnea (adult) (pediatric): Secondary | ICD-10-CM

## 2021-12-23 NOTE — Progress Notes (Signed)
Debra Perez    [id number]    10/13/1955  Primary Care Physician:Clark, Leticia Penna, NP  Referring Physician: Pleas [name], NP [place],  [place] [address]  Chief complaint:   Patient with a history of obstructive sleep apnea In for follow-up today Has been doing well  HPI:  History of obstructive sleep apnea Remains compliant with CPAP use  Not having any significant symptoms today  Waking up feeling like she has had a good nights rest Functioning well Staying active  Sleep apnea was diagnosed when she was out in [place] She is unsure how severe the sleep apnea is for a current machine settings  Usually goes to bed about 1030 takes about 5 to 10 minutes to fall asleep 1-2 awakenings final wake up time between 630 and 7 Weight has been relatively stable  No dryness of her mouth in the mornings No morning headaches No night sweats Memory is good No sleepy driving  History of hypertension, atrial fibrillation once in the past, hypercholesterolemia  reformed smoker, quit in 2007  No history of lung disease No family history of obstructive sleep apnea Spouse has sleep apnea, on treatment  Outpatient Encounter Medications as of 12/23/2021  Medication Sig   Accu-Chek FastClix Lancets MISC USE TO TEST BLOOD SUGAR TWICE DAILY AS DIRECTED   [name] 50 MG tablet TAKE 1 TABLET BY MOUTH AT BEDTIME FOR BACK PAIN   b complex vitamins capsule Take 1 capsule by mouth 2 (two) times daily.   Continuous Blood Gluc Sensor (FREESTYLE LIBRE 3 SENSOR) MISC Place 1 sensor on the skin every 14 days. Use to check glucose continuously   ezetimibe (ZETIA) 10 MG tablet TAKE 1 TABLET BY MOUTH DAILY GENERIC EQUIVALENT FOR ZETIA   famotidine (PEPCID) 20 MG tablet TAKE 1 TABLET BY MOUTH TWICE DAILY FOR HEARTBURN   fluticasone (FLONASE) 50 MCG/ACT nasal spray Place 1 spray into both nostrils 2 (two) times daily.   gentamicin ointment (GARAMYCIN)  0.1 % Apply 1 application topically daily as needed. For nasal sore.   glipiZIDE (GLUCOTROL XL) 10 MG 24 hr tablet TAKE 1 TABLET BY MOUTH DAILY WITH BREAKFAST FOR DIABETES   hydrochlorothiazide (HYDRODIURIL) 25 MG tablet Take 1 tablet (25 mg total) by mouth daily. Please schedule yearly appointment for October 2023 for future refills. Thank you   Iron-Vitamin C 65-125 MG TABS Take by mouth daily.    JANUMET 50-1000 MG tablet TAKE 1 TABLET BY MOUTH 2 TIMES DAILY WITH A MEAL FOR DIABETES   lisinopril (ZESTRIL) 20 MG tablet Take 1 tablet (20 mg total) by mouth daily.   Magnesium 500 MG CAPS Take 1 capsule by mouth 2 (two) times daily.   metFORMIN (GLUCOPHAGE-XR) 500 MG 24 hr tablet Take 1 tablet (500 mg total) by mouth daily with breakfast. For diabetes.   metoprolol succinate (TOPROL-XL) 25 MG 24 hr tablet Take 1 tablet (25 mg total) by mouth 2 (two) times daily. Take with or immediately following a meal. Please schedule yearly appointment for October to receive future refills. Thank you.   Multiple Vitamins-Minerals (MULTIVITAMIN ADULT PO) Take 1 capsule by mouth daily.    propafenone (RYTHMOL SR) 225 MG 12 hr capsule Take 1 capsule (225 mg total) by mouth 2 (two) times daily.   rosuvastatin (CRESTOR) 5 MG tablet TAKE 1 TABLET BY MOUTH DAILY GENERIC EQUIVALENT FOR CRESTOR   SYNTHROID 88 MCG tablet Take 1 tablet by mouth every morning  on an empty stomach with water only.  No food or other medications for 30 minutes.   venlafaxine XR (EFFEXOR-XR) 37.5 MG 24 hr capsule TAKE ONE CAPSULE BY MOUTH DAILY WITH BREAKFAST FOR HOT FLASHES   verapamil (CALAN-SR) 240 MG CR tablet Take 1 tablet (240 mg total) by mouth daily.   Facility-Administered Encounter Medications as of 12/23/2021  Medication   heparin lock flush 100 unit/mL   sodium chloride flush (NS) 0.9 % injection 10 mL    Allergies as of 12/23/2021   (No Known Allergies)    Past Medical History:  Diagnosis Date   Carotid artery occlusion     20 % left side   Diabetes mellitus without complication (HCC)    Diverticulosis    GERD (gastroesophageal reflux disease)    History of colonic polyps    benign   Hyperlipidemia    Hypertension    Hypothyroidism    Lung infection    Mitral valve prolapse    NSVT (nonsustained ventricular tachycardia) (HCC)    Obstructive sleep apnea    on CPAP   PAF (paroxysmal atrial fibrillation) ([place])    a.  On Eliquis; b. CHADS2VASc => 3 (HTN, DM, female)   Sleep apnea     Past Surgical History:  Procedure Laterality Date   ABDOMINAL HYSTERECTOMY  2002   ovaries remain   CLAVICLE SURGERY  2007   right clavicle plate pins   COLONOSCOPY WITH PROPOFOL N/A 10/17/2017   Procedure: COLONOSCOPY WITH PROPOFOL;  Surgeon: Jonathon Bellows, MD;  Location: [facility] ENDOSCOPY;  Service: Gastroenterology;  Laterality: N/A;   ESOPHAGOGASTRODUODENOSCOPY (EGD) WITH PROPOFOL N/A 10/17/2017   Procedure: ESOPHAGOGASTRODUODENOSCOPY (EGD) WITH PROPOFOL;  Surgeon: Jonathon Bellows, MD;  Location: [facility] ENDOSCOPY;  Service: Gastroenterology;  Laterality: N/A;   GIVENS CAPSULE STUDY N/A 11/30/2017   Procedure: GIVENS CAPSULE STUDY;  Surgeon: Jonathon Bellows, MD;  [facility] END[facility]COPY;  Service: Gastroenterology;  Laterality: N/A;   KNEE ARTHROSCOPY  2012    Family History  Problem Relation Age of Onset   Depression Mother    Sudden death Mother 47   Heart attack Mother    Arthritis Father    Heart disease Father    Hypertension Father    Emphysema Father    Bladder Cancer Sister    Diabetes Sister        Type 2   Diabetes Brother        type   Hypertension Brother    Emphysema Brother    Heart disease Brother    Diabetes Maternal Grandfather    Bladder Cancer Maternal Grandmother        sinus cancer    Breast cancer Paternal Grandmother    Cancer Paternal Grandfather        colon cancer   Diabetes Paternal Grandfather     Social History   Socioeconomic History   Marital status: Married    Spouse name: Not on  file   Number of children: Not on file   Years of education: Not on file   Highest education level: Not on file  Occupational History   Occupation: retired  Tobacco Use   Smoking status: Former    Packs/day: 1.00    Years: 20.00    Total pack years: 20.00    Types: Cigarettes    Quit date: 03/07/2004    Years since quitting: 17.8   Smokeless tobacco: Never  Vaping Use   Vaping Use: Never used  Substance and Sexual Activity   Alcohol  use: No    Alcohol/week: 0.0 standard drinks of alcohol   Drug use: No   Sexual activity: Not on file  Other Topics Concern   Not on file  Social History Narrative   Married.   1 daughter.   Moved here from [place] due to husbands occupation.   She is a Corporate treasurer.   Enjoys sewing, gardening.   Social Determinants of Health   Financial Resource Strain: Not on file  Food Insecurity: Not on file  Transportation Needs: Not on file  Physical Activity: Not on file  Stress: Not on file  Social Connections: Not on file  Intimate Partner Violence: Not on file    Review of Systems  Constitutional:  Negative for unexpected weight change.  Respiratory:  Positive for apnea. Negative for shortness of breath.   Psychiatric/Behavioral:  Positive for sleep disturbance.     Vitals:   12/23/21 0931  BP: 112/68  Pulse: 65  SpO2: 96%     Physical Exam Constitutional:      Appearance: Normal appearance.  HENT:     Head: Normocephalic.     Nose: No congestion.     Mouth/Throat:     Mouth: Mucous membranes are moist.     Comments: Mallampati 4, crowded oropharynx Eyes:     General:        Right eye: No discharge.        Left eye: No discharge.     Pupils: Pupils are equal, round, and reactive to light.  Cardiovascular:     Rate and Rhythm: Normal rate.     Heart sounds: No murmur heard.    No friction rub.  Pulmonary:     Effort: No respiratory distress.     Breath sounds: No stridor. No wheezing or rhonchi.  Musculoskeletal:     Cervical  back: No rigidity or tenderness.  Neurological:     Mental Status: She is alert.  Psychiatric:        Mood and Affect: Mood normal.     CPAP compliance reviewed showing excellent compliance with CPAP 100% use Average use of 8 hours 59 minutes Machine set between 5 and 15 Residual AHI of 0.4  Assessment:  Patient with a history of obstructive sleep apnea -Remains compliant with CPAP use -Encouraged to continue CPAP on a nightly basis  Graded activities as tolerated   Plan/Recommendations: Continue current CPAP therapy  Encouraged to call with significant concerns  Follow-up in a year   Sherrilyn Rist MD [facility] 12/23/2021, 9:56 AM  CC: Pleas [name], NP

## 2021-12-23 NOTE — Patient Instructions (Addendum)
I will see you a year from now  Continue using CPAP on a nightly basis  Call us with significant concerns  Your download shows that the CPAP is working well

## 2021-12-27 NOTE — Progress Notes (Signed)
Patient ID: Debra Perez, female   DOB: Sep 22, 1955, 66 y.o.   MRN: [id number] Cardiology Office Note  Date:  12/28/2021   ID:  Debra Perez, DOB 06/14/1955, MRN [id number]  PCP:  Pleas [name], NP   Chief Complaint  Patient presents with   12 month follow up     Patient c/o palpitations. Medications reviewed by the patient verbally.     HPI:  Ms. Debra Perez is a pleasant 66 year old woman with history of  Diabetes II Paroxysmal atrial fibrillation, ChadsVASC 4 to 5 nonsustained VT normal ejection fraction with EF greater than 60%,  mild three-vessel coronary artery disease on CTA scan,  20 years of smoking who stopped 13 years ago,  atrial fibrillation after inhaling albuterol while on prednisone,  evaluated in the hospital at that time with arrhythmia lasting for hours before converting back to normal sinus rhythm,  Obstructive sleep apnea, on CPAP Who presents for follow-up of her atrial fibrillation  LOV October 2022 Toprol succinate up to twice daily for PVCs  Received message August 2023 blood pressure running lower Was getting orthostasis symptoms Recommendation to hydrate and if symptoms persisted, cut HCTZ in half daily or take every other day  Received message concerning for arrhythmia December 19, 2021 Using Apple Watch device , had what appeared to be idioventricular rhythm converting to normal sinus rhythm  In follow-up today denies significant arrhythmia, symptoms from PVCs or atrial fibrillation Uses her apple watch to record ectopy Reports only having 1 episode of atrial fibrillation approximately 8 years ago, none since then  Reports weight has been trending up, no regular exercise program Currently not taking Eliquis  Labs: Reviewed on today's visit A1C 7.3 Total chol 116, LDL 42  Tolerating Crestor, zetia  EKG personally reviewed by myself on todays visit Shows No sinus rhythm rate 57 bpm nonspecific ST abnormality right bundle branch block,  no change  Other past medical history reviewed CT scan done in 2016 again reviewed with him showing mild coronary artery disease calcification   atrial fibrillation 01/16/2015 lasting at least 5 hours  metoprolol held for fatigue  multaq held for hair loss  started Rythmol 225 mg twice a day, SR dose  Cardiac CTA with mild three-vessel CAD, ectatic ascending aorta measuring 3.72 cm  PFTs with mild obstructive lung disease   PMH:   has a past medical history of Carotid artery occlusion, Diabetes mellitus without complication ([place]), Diverticulosis, GERD (gastroesophageal reflux disease), History of colonic polyps, Hyperlipidemia, Hypertension, Hypothyroidism, Lung infection, Mitral valve prolapse, NSVT (nonsustained ventricular tachycardia) ([place]), Obstructive sleep apnea, PAF (paroxysmal atrial fibrillation) ([place]), and Sleep apnea.  PSH:    Past Surgical History:  Procedure Laterality Date   ABDOMINAL HYSTERECTOMY  2002   ovaries remain   CLAVICLE SURGERY  2007   right clavicle plate pins   COLONOSCOPY WITH PROPOFOL N/A 10/17/2017   Procedure: COLONOSCOPY WITH PROPOFOL;  Surgeon: Jonathon Bellows, MD;  Location: [facility] ENDOSCOPY;  Service: Gastroenterology;  Laterality: N/A;   ESOPHAGOGASTRODUODENOSCOPY (EGD) WITH PROPOFOL N/A 10/17/2017   Procedure: ESOPHAGOGASTRODUODENOSCOPY (EGD) WITH PROPOFOL;  Surgeon: Jonathon Bellows, MD;  Location: [facility] ENDOSCOPY;  Service: Gastroenterology;  Laterality: N/A;   GIVENS CAPSULE STUDY N/A 11/30/2017   Procedure: GIVENS CAPSULE STUDY;  Surgeon: Jonathon Bellows, MD;  Location: [facility] ENDOSCOPY;  Service: Gastroenterology;  Laterality: N/A;   KNEE ARTHROSCOPY  2012    Current Outpatient Medications  Medication Sig Dispense Refill   Accu-Chek FastClix Lancets MISC USE TO TEST BLOOD SUGAR TWICE DAILY AS  DIRECTED 204 each 1   [name] 50 MG tablet TAKE 1 TABLET BY MOUTH AT BEDTIME FOR BACK PAIN 90 tablet 3   apixaban (ELIQUIS) 5 MG TABS tablet Take 5 mg  by mouth 2 (two) times daily.     b complex vitamins capsule Take 1 capsule by mouth 2 (two) times daily.     Continuous Blood Gluc Sensor (FREESTYLE LIBRE 3 SENSOR) MISC Place 1 sensor on the skin every 14 days. Use to check glucose continuously 6 each 1   ezetimibe (ZETIA) 10 MG tablet TAKE 1 TABLET BY MOUTH DAILY GENERIC EQUIVALENT FOR ZETIA 90 tablet 3   famotidine (PEPCID) 20 MG tablet TAKE 1 TABLET BY MOUTH TWICE DAILY FOR HEARTBURN 180 tablet 0   gentamicin ointment (GARAMYCIN) 0.1 % Apply 1 application topically daily as needed. For nasal sore. 15 g 0   glipiZIDE (GLUCOTROL XL) 10 MG 24 hr tablet TAKE 1 TABLET BY MOUTH DAILY WITH BREAKFAST FOR DIABETES 90 tablet 0   hydrochlorothiazide (HYDRODIURIL) 25 MG tablet Take 1 tablet (25 mg total) by mouth daily. Please schedule yearly appointment for October 2023 for future refills. Thank you 90 tablet 0   Iron-Vitamin C 65-125 MG TABS Take by mouth daily.      JANUMET 50-1000 MG tablet TAKE 1 TABLET BY MOUTH 2 TIMES DAILY WITH A MEAL FOR DIABETES 180 tablet 0   Magnesium 500 MG CAPS Take 1 capsule by mouth 2 (two) times daily.     metFORMIN (GLUCOPHAGE-XR) 500 MG 24 hr tablet Take 1 tablet (500 mg total) by mouth daily with breakfast. For diabetes. 90 tablet 3   methocarbamol (ROBAXIN) 750 MG tablet Take 375-750 mg by mouth at bedtime as needed.     metoprolol succinate (TOPROL-XL) 25 MG 24 hr tablet Take 1 tablet (25 mg total) by mouth 2 (two) times daily. Take with or immediately following a meal. Please schedule yearly appointment for October to receive future refills. Thank you. 120 tablet 0   Multiple Vitamins-Minerals (MULTIVITAMIN ADULT PO) Take 1 capsule by mouth daily.      propafenone (RYTHMOL SR) 225 MG 12 hr capsule Take 1 capsule (225 mg total) by mouth 2 (two) times daily. 180 capsule 0   rosuvastatin (CRESTOR) 5 MG tablet TAKE 1 TABLET BY MOUTH DAILY GENERIC EQUIVALENT FOR CRESTOR 90 tablet 0   SYNTHROID 88 MCG tablet Take 1 tablet  by mouth every morning on an empty stomach with water only.  No food or other medications for 30 minutes. 90 tablet 3   venlafaxine XR (EFFEXOR-XR) 37.5 MG 24 hr capsule TAKE ONE CAPSULE BY MOUTH DAILY WITH BREAKFAST FOR HOT FLASHES 90 capsule 3   verapamil (CALAN-SR) 240 MG CR tablet Take 1 tablet (240 mg total) by mouth daily. 90 tablet 0   lisinopril (ZESTRIL) 20 MG tablet Take 1 tablet (20 mg total) by mouth daily. 90 tablet 3   No current facility-administered medications for this visit.   Facility-Administered Medications Ordered in Other Visits  Medication Dose Route Frequency Provider Last Rate Last Admin   heparin lock flush 100 unit/mL  500 Units Intracatheter Once PRN Earlie Server, MD       sodium chloride flush (NS) 0.9 % injection 10 mL  10 mL Intracatheter PRN Earlie Server, MD   10 mL at 10/06/17 1515     Allergies:   Patient has no known allergies.   Social History:  The patient  reports that she quit smoking about 17 years  ago. Her smoking use included cigarettes. She has a 20.00 pack-year smoking history. She has never used smokeless tobacco. She reports that she does not drink alcohol and does not use drugs.   Family History:   family history includes Arthritis in her father; Bladder Cancer in her maternal grandmother and sister; Breast cancer in her paternal grandmother; Cancer in her paternal grandfather; Depression in her mother; Diabetes in her brother, maternal grandfather, paternal grandfather, and sister; Emphysema in her brother and father; Heart attack in her mother; Heart disease in her brother and father; Hypertension in her brother and father; Sudden death (age of onset: 30) in her mother.    Review of Systems: Review of Systems  Constitutional: Negative.   HENT: Negative.    Respiratory: Negative.    Cardiovascular:  Positive for palpitations.  Gastrointestinal: Negative.   Musculoskeletal: Negative.   Neurological: Negative.   Psychiatric/Behavioral: Negative.     All other systems reviewed and are negative.    PHYSICAL EXAM: VS:  BP 122/72 (BP Location: Left Arm, Patient Position: Sitting, Cuff Size: Normal)   Pulse (!) 57   Ht '5\' 6"'$  (1.676 m)   Wt 181 lb 8 oz (82.3 kg)   SpO2 97%   BMI 29.29 kg/m  , BMI Body mass index is 29.29 kg/m. Constitutional:  oriented to person, place, and time. No distress.  HENT:  Head: Grossly normal Eyes:  no discharge. No scleral icterus.  Neck: No JVD, no carotid bruits  Cardiovascular: Regular rate and rhythm, no murmurs appreciated Pulmonary/Chest: Clear to auscultation bilaterally, no wheezes or rails Abdominal: Soft.  no distension.  no tenderness.  Musculoskeletal: Normal range of motion Neurological:  normal muscle tone. Coordination normal. No atrophy Skin: Skin warm and dry Psychiatric: normal affect, pleasant  Recent Labs: 11/30/2021: ALT 26; TSH 0.96 12/03/2021: BUN 14; Creatinine, Ser 0.94; Potassium 4.5; Sodium 132    Lipid Panel Lab Results  Component Value Date   CHOL 116 11/30/2021   HDL 55.90 11/30/2021   LDLCALC 42 11/30/2021   TRIG 87.0 11/30/2021      Wt Readings from Last 3 Encounters:  12/28/21 181 lb 8 oz (82.3 kg)  12/23/21 183 lb 6.4 oz (83.2 kg)  11/30/21 183 lb (83 kg)     ASSESSMENT AND PLAN:  Paroxysmal atrial fibrillation (HCC) -  tolerating Rythmol  metoprol succinate 25 twice a day, verapamil Previously requested to stop Eliquis Again today reiterated she would prefer not to be on Eliquis Uses her Apple Watch to monitor for rhythm  PVCs Previous the declined Holter monitor Today reports she is asymptomatic If symptoms get worse recommend she call our office  Essential hypertension -  She is taking HCTZ every other day or every third day as blood pressure was low Blood pressure stable today on current regiment  NSVT (nonsustained ventricular tachycardia) (HCC) Continue beta-blocker, verapamil and propafenone Denies tachypalpitations concerning for  arrhythmia  Hyperlipidemia Statin and Zetia, numbers at goal  Type 2 diabetes mellitus with other circulatory complication, without long-term current use of insulin (HCC) Recommended walking program for weight loss Calorie restriction   Total encounter time more than 30 minutes  Greater than 50% was spent in counseling and coordination of care with the patient     Orders Placed This Encounter  Procedures   EKG 12-Lead     Signed, Esmond Plants, M.D., Ph.D. 12/28/2021  [place], [place]

## 2021-12-28 ENCOUNTER — Ambulatory Visit (INDEPENDENT_AMBULATORY_CARE_PROVIDER_SITE_OTHER): Payer: Managed Care, Other (non HMO)

## 2021-12-28 ENCOUNTER — Encounter: Payer: Self-pay | Admitting: Cardiovascular Disease

## 2021-12-28 ENCOUNTER — Ambulatory Visit: Payer: Managed Care, Other (non HMO) | Attending: Cardiovascular Disease | Admitting: Cardiovascular Disease

## 2021-12-28 ENCOUNTER — Ambulatory Visit (INDEPENDENT_AMBULATORY_CARE_PROVIDER_SITE_OTHER): Payer: Managed Care, Other (non HMO) | Admitting: Podiatry

## 2021-12-28 VITALS — BP 122/72 | HR 57 | Ht 66.0 in | Wt 181.5 lb

## 2021-12-28 DIAGNOSIS — I059 Rheumatic mitral valve disease, unspecified: Secondary | ICD-10-CM

## 2021-12-28 DIAGNOSIS — S92355A Nondisplaced fracture of fifth metatarsal bone, left foot, initial encounter for closed fracture: Secondary | ICD-10-CM

## 2021-12-28 DIAGNOSIS — I2510 Atherosclerotic heart disease of native coronary artery without angina pectoris: Secondary | ICD-10-CM

## 2021-12-28 DIAGNOSIS — I471 Supraventricular tachycardia, unspecified: Secondary | ICD-10-CM | POA: Diagnosis not present

## 2021-12-28 DIAGNOSIS — I480 Paroxysmal atrial fibrillation: Secondary | ICD-10-CM

## 2021-12-28 DIAGNOSIS — I77819 Aortic ectasia, unspecified site: Secondary | ICD-10-CM

## 2021-12-28 DIAGNOSIS — E782 Mixed hyperlipidemia: Secondary | ICD-10-CM

## 2021-12-28 DIAGNOSIS — I10 Essential (primary) hypertension: Secondary | ICD-10-CM

## 2021-12-28 DIAGNOSIS — I700 Atherosclerosis of aorta: Secondary | ICD-10-CM | POA: Diagnosis not present

## 2021-12-28 MED ORDER — LISINOPRIL 20 MG PO TABS: 20.0000 mg | ORAL_TABLET | Freq: Every day | ORAL | 3 refills | Status: DC

## 2021-12-28 NOTE — Progress Notes (Signed)
Chief Complaint  Patient presents with   left foot pain    Patient is here for left foot pain.   Foot Pain    Patient here for left foot pain that started 2 weeks ago, pain on the top of the foot.    HPI: 66 y.o. female presenting today for evaluation of pain and tenderness associated to the lateral aspect of the left foot.  Patient believes that she possibly broke her foot.  She says that about 2 weeks ago she fell and injured her left foot.  She has been walking with an antalgic gait and yesterday she fell again and injured it even more and so she presents for further treatment and evaluation.  Past Medical History:  Diagnosis Date   Carotid artery occlusion    20 % left side   Diabetes mellitus without complication (HCC)    Diverticulosis    GERD (gastroesophageal reflux disease)    History of colonic polyps    benign   Hyperlipidemia    Hypertension    Hypothyroidism    Lung infection    Mitral valve prolapse    NSVT (nonsustained ventricular tachycardia) (HCC)    Obstructive sleep apnea    on CPAP   PAF (paroxysmal atrial fibrillation) ([place])    a.  On Eliquis; b. CHADS2VASc => 3 (HTN, DM, female)   Sleep apnea     Past Surgical History:  Procedure Laterality Date   ABDOMINAL HYSTERECTOMY  2002   ovaries remain   CLAVICLE SURGERY  2007   right clavicle plate pins   COLONOSCOPY WITH PROPOFOL N/A 10/17/2017   Procedure: COLONOSCOPY WITH PROPOFOL;  Surgeon: Jonathon Bellows, MD;  Location: [facility] ENDOSCOPY;  Service: Gastroenterology;  Laterality: N/A;   ESOPHAGOGASTRODUODENOSCOPY (EGD) WITH PROPOFOL N/A 10/17/2017   Procedure: ESOPHAGOGASTRODUODENOSCOPY (EGD) WITH PROPOFOL;  Surgeon: Jonathon Bellows, MD;  Location: [facility] ENDOSCOPY;  Service: Gastroenterology;  Laterality: N/A;   GIVENS CAPSULE STUDY N/A 11/30/2017   Procedure: GIVENS CAPSULE STUDY;  Surgeon: Jonathon Bellows, MD;  Location: [facility] ENDOSCOPY;  Service: Gastroenterology;  Laterality: N/A;   KNEE ARTHROSCOPY  2012    No  Known Allergies   Physical Exam: General: The patient is alert and oriented x3 in no acute distress.  Dermatology: Skin is warm, dry and supple bilateral lower extremities. Negative for open lesions or macerations.  Vascular: Palpable pedal pulses bilaterally. Capillary refill within normal limits.    Neurological: Light touch and protective threshold grossly intact  Musculoskeletal Exam: No pedal deformities noted.  There is some moderate edema with tenderness to the lateral aspect of the left foot around the fifth metatarsal tubercle area  Radiographic Exam LT foot 12/28/2021:  Normal osseous mineralization.  Closed, nondisplaced, extra-articular transverse fracture at the metaphyseal diaphyseal junction of the fifth metatarsal base consistent with a Jones fracture.  Less than 1 mm gapping  Assessment: 1.  Metatarsal Jones fracture LT foot; closed, nondisplaced, initial encounter   Plan of Care:  1. Patient evaluated. X-Rays reviewed demonstrating the Jones fracture.  2.  Cam boot dispensed.  Minimal WBAT 3.  Ace wraps dispensed.  Apply daily 4.  Recommend rest ice elevation compression 5.  Patient has a walker and cane at home.  Recommend using the walker and cane to reduce pressure from the foot 6.  Return to clinic in 6 weeks for follow-up x-ray      Edrick Kins, DPM [facility]  Dr. Edrick Kins, DPM    [address], [place] [address]                Office [phone]  Fax [phone]

## 2021-12-28 NOTE — Patient Instructions (Signed)
Medication Instructions:  No changes  If you need a refill on your cardiac medications before your next appointment, please call your pharmacy.   Lab work: No new labs needed  Testing/Procedures: No new testing needed  Follow-Up: At [facility], you and your health needs are our priority.  As part of our continuing mission to provide you with exceptional heart care, we have created designated Provider Care Teams.  These Care Teams include your primary Cardiologist (physician) and Advanced Practice Providers (APPs -  Physician Assistants and Nurse Practitioners) who all work together to provide you with the care you need, when you need it.  You will need a follow up appointment in 12 months  Providers on your designated Care Team:   Christopher Berge, NP Ryan Dunn, PA-C Cadence Furth, PA-C  COVID-19 Vaccine Information can be found at: [email or web address] For questions related to vaccine distribution or appointments, please email [email or web address] or call [phone].   

## 2021-12-29 ENCOUNTER — Other Ambulatory Visit: Payer: Self-pay | Admitting: Podiatry

## 2021-12-29 ENCOUNTER — Encounter: Payer: Self-pay | Admitting: Podiatry

## 2021-12-29 ENCOUNTER — Telehealth: Payer: Self-pay | Admitting: *Deleted

## 2021-12-29 DIAGNOSIS — S92355A Nondisplaced fracture of fifth metatarsal bone, left foot, initial encounter for closed fracture: Secondary | ICD-10-CM

## 2021-12-29 NOTE — Telephone Encounter (Signed)
Order placed in the patient's chart for knee scooter.  Thanks, Dr. Amalia Hailey

## 2021-12-29 NOTE — Telephone Encounter (Signed)
Patient is requesting a knee scooter, thinks using this would help much better, easier for broken foot. Please advise.

## 2021-12-29 NOTE — Progress Notes (Signed)
PRN LT foot fracture

## 2021-12-30 ENCOUNTER — Telehealth: Payer: Self-pay | Admitting: *Deleted

## 2021-12-30 ENCOUNTER — Other Ambulatory Visit: Payer: Self-pay | Admitting: Podiatry

## 2021-12-30 MED ORDER — HYDROCODONE-ACETAMINOPHEN 5-325 MG PO TABS: 1.0000 | ORAL_TABLET | Freq: Four times a day (QID) | ORAL | 0 refills | Status: DC | PRN

## 2021-12-30 NOTE — Progress Notes (Signed)
PRN pain 

## 2021-12-30 NOTE — Telephone Encounter (Signed)
Order for knee scooter has been faxed to [facility].contacted them for availability,in stock, called patient to let her  know that the order has been sent,have in stock to rent or purchase. Patient aware thru voice message.

## 2022-01-03 ENCOUNTER — Other Ambulatory Visit: Payer: Self-pay | Admitting: Primary Care

## 2022-01-03 ENCOUNTER — Other Ambulatory Visit: Payer: Self-pay | Admitting: Podiatry

## 2022-01-03 ENCOUNTER — Ambulatory Visit: Payer: Managed Care, Other (non HMO) | Admitting: Cardiovascular Disease

## 2022-01-03 ENCOUNTER — Encounter (INDEPENDENT_AMBULATORY_CARE_PROVIDER_SITE_OTHER): Payer: Self-pay

## 2022-01-03 DIAGNOSIS — Z1231 Encounter for screening mammogram for malignant neoplasm of breast: Secondary | ICD-10-CM

## 2022-01-03 DIAGNOSIS — S92355A Nondisplaced fracture of fifth metatarsal bone, left foot, initial encounter for closed fracture: Secondary | ICD-10-CM

## 2022-01-03 NOTE — Progress Notes (Signed)
PRN LT foot fracture

## 2022-01-05 ENCOUNTER — Encounter: Payer: Self-pay | Admitting: Primary Care

## 2022-01-05 ENCOUNTER — Ambulatory Visit (INDEPENDENT_AMBULATORY_CARE_PROVIDER_SITE_OTHER): Payer: Managed Care, Other (non HMO) | Admitting: Primary Care

## 2022-01-05 ENCOUNTER — Telehealth: Payer: Self-pay | Admitting: Primary Care

## 2022-01-05 VITALS — BP 122/72 | HR 65 | Temp 98.1°F | Ht 66.0 in

## 2022-01-05 DIAGNOSIS — J3489 Other specified disorders of nose and nasal sinuses: Secondary | ICD-10-CM | POA: Insufficient documentation

## 2022-01-05 MED ORDER — PREDNISONE 20 MG PO TABS: ORAL_TABLET | ORAL | 0 refills | Status: DC

## 2022-01-05 NOTE — Telephone Encounter (Signed)
Patient evaluated.  

## 2022-01-05 NOTE — Assessment & Plan Note (Signed)
Suspect viral etiology at this point. Negative flu and Covid tests negative today.  Continue Advil-Tylenol. Add prednisone 40 mg daily x 5 days.  Consider antibiotic treatment if no improvement after 7 days.

## 2022-01-05 NOTE — Telephone Encounter (Signed)
Patient called and stated she is experiencing a sinus infection, stomach is hurting and her head is spinning. Patient was sent to access nurse.

## 2022-01-05 NOTE — Telephone Encounter (Signed)
Had a cancellation on our schedule for 11:40 today. Called patient and she would like to take appointment. Scheduled patient for 11:40 today.

## 2022-01-05 NOTE — Progress Notes (Signed)
Subjective:    Patient ID: Debra Perez, female    DOB: 02-01-1956, 66 y.o.   MRN: [id number]  Sinus Problem Associated symptoms include chills, congestion, headaches and sinus pressure. Pertinent negatives include no coughing or shortness of breath.    Debra Perez is a very pleasant 66 y.o. female with a history of sinusitis, GERD, type 2 diabetes, who presents today to discuss sinus pressure.  Symptom onset 3 days ago with headache and dizziness. She then developed sore throat, chills, pressure behind her eyes and to the parietal head. She denies cough.   She's been taking Tylenol-Advil for her headaches. Her husband does not have symptoms.    Review of Systems  Constitutional:  Positive for chills.  HENT:  Positive for congestion, sinus pressure and sinus pain.   Respiratory:  Negative for cough and shortness of breath.   Cardiovascular:  Negative for chest pain.  Neurological:  Positive for headaches.         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  On Eliquis; b. CHADS2VASc => 3 (HTN, DM, female)   Sleep apnea     Social History   Socioeconomic History   Marital status: Married    Spouse name: Not on file   Number of children: Not on file   Years of education: Not on file   Highest education level: Not on file  Occupational History   Occupation: retired  Tobacco Use   Smoking status: Former    Packs/day: 1.00    Years: 20.00    Total pack years: 20.00    Types: Cigarettes    Quit date: 03/07/2004    Years since quitting:  17.8   Smokeless tobacco: Never  Vaping Use   Vaping Use: Never used  Substance and Sexual Activity   Alcohol use: No    Alcohol/week: 0.0 standard drinks of alcohol   Drug use: No   Sexual activity: Not on file  Other Topics Concern   Not on file  Social History Narrative   Married.   1 daughter.   Moved here from [place] due to husbands occupation.   She is a Corporate treasurer.   Enjoys sewing, gardening.   Social Determinants of Health   Financial Resource Strain: Not on file  Food Insecurity: Not on file  Transportation Needs: Not on file  Physical Activity: Not on file  Stress: Not on file  Social Connections: Not on file  Intimate Partner Violence: Not on file    Past Surgical History:  Procedure Laterality Date   ABDOMINAL HYSTERECTOMY  2002   ovaries remain   CLAVICLE SURGERY  2007   right clavicle plate pins   COLONOSCOPY WITH PROPOFOL N/A 10/17/2017   Procedure: COLONOSCOPY WITH PROPOFOL;  Surgeon: Jonathon Bellows, MD;  Location: [facility] ENDOSCOPY;  Service: Gastroenterology;  Laterality: N/A;   ESOPHAGOGASTRODUODENOSCOPY (EGD) WITH PROPOFOL N/A 10/17/2017   Procedure: ESOPHAGOGASTRODUODENOSCOPY (EGD) WITH PROPOFOL;  Surgeon: Jonathon Bellows, MD;  Location: [facility] ENDOSCOPY;  Service: Gastroenterology;  Laterality: N/A;   GIVENS CAPSULE STUDY N/A 11/30/2017   Procedure: GIVENS CAPSULE STUDY;  Surgeon: Jonathon Bellows, MD;  Location: [facility] ENDOSCOPY;  Service: Gastroenterology;  Laterality: N/A;   KNEE ARTHROSCOPY  2012    Family History  Problem Relation Age of Onset   Depression Mother    Sudden death Mother 70   Heart attack Mother    Arthritis Father    Heart disease Father    Hypertension Father    Emphysema Father    Bladder Cancer Sister    Diabetes Sister        Type 2   Diabetes Brother        type   Hypertension Brother    Emphysema Brother    Heart disease Brother    Diabetes Maternal Grandfather    Bladder Cancer Maternal Grandmother        sinus cancer    Breast  cancer Paternal Grandmother    Cancer Paternal Grandfather        colon cancer   Diabetes Paternal Grandfather     No Known Allergies  Current Outpatient Medications on File Prior to Visit  Medication Sig Dispense Refill   [name] 50 MG tablet TAKE 1 TABLET BY MOUTH AT BEDTIME FOR BACK PAIN 90 tablet 3   apixaban (ELIQUIS) 5 MG TABS tablet Take 5 mg by mouth 2 (two) times daily.     b complex vitamins capsule Take 1 capsule by mouth 2 (two) times daily.     Continuous Blood Gluc Sensor (FREESTYLE LIBRE 3 SENSOR) MISC Place 1 sensor on the skin every 14 days. Use to check glucose continuously 6 each 1   ezetimibe (ZETIA) 10 MG tablet TAKE 1 TABLET BY MOUTH DAILY GENERIC EQUIVALENT FOR ZETIA 90 tablet 3   famotidine (PEPCID) 20 MG tablet TAKE 1 TABLET BY MOUTH TWICE DAILY FOR HEARTBURN 180 tablet 0   gentamicin ointment (GARAMYCIN) 0.1 % Apply 1 application topically daily as needed. For nasal sore. 15 g 0   glipiZIDE (GLUCOTROL XL) 10 MG 24 hr tablet TAKE 1 TABLET BY MOUTH DAILY WITH BREAKFAST FOR DIABETES 90 tablet 0   hydrochlorothiazide (HYDRODIURIL) 25 MG tablet Take 1 tablet (25 mg total) by mouth daily. Please schedule yearly appointment for October 2023 for future refills. Thank you 90 tablet 0   HYDROcodone-acetaminophen (NORCO/VICODIN) 5-325 MG tablet Take 1 tablet by mouth every 6 (six) hours as needed for moderate pain. 20 tablet 0   Iron-Vitamin C 65-125 MG TABS Take by mouth daily.      JANUMET 50-1000 MG tablet TAKE 1 TABLET BY MOUTH 2 TIMES DAILY WITH A MEAL FOR DIABETES 180 tablet 0   lisinopril (ZESTRIL) 20 MG tablet Take 1 tablet (20 mg total) by mouth daily. 90 tablet 3   Magnesium 500 MG CAPS Take 1 capsule by mouth 2 (two) times daily.     methocarbamol (ROBAXIN) 750 MG tablet Take 375-750 mg by mouth at bedtime as needed.     metoprolol succinate (TOPROL-XL) 25 MG 24 hr tablet Take 1 tablet (25 mg total) by mouth 2 (two) times daily. Take with or  immediately following a meal. Please schedule yearly appointment for October to receive future refills. Thank you. 120 tablet 0   Multiple Vitamins-Minerals (MULTIVITAMIN ADULT PO) Take 1 capsule by mouth daily.      propafenone (RYTHMOL SR) 225 MG 12 hr capsule Take 1 capsule (225 mg total) by mouth 2 (two) times daily. [place]  capsule 0   rosuvastatin (CRESTOR) 5 MG tablet TAKE 1 TABLET BY MOUTH DAILY GENERIC EQUIVALENT FOR CRESTOR 90 tablet 0   SYNTHROID 88 MCG tablet Take 1 tablet by mouth every morning on an empty stomach with water only.  No food or other medications for 30 minutes. 90 tablet 3   venlafaxine XR (EFFEXOR-XR) 37.5 MG 24 hr capsule TAKE ONE CAPSULE BY MOUTH DAILY WITH BREAKFAST FOR HOT FLASHES 90 capsule 3   verapamil (CALAN-SR) 240 MG CR tablet Take 1 tablet (240 mg total) by mouth daily. 90 tablet 0   Accu-Chek FastClix Lancets MISC USE TO TEST BLOOD SUGAR TWICE DAILY AS DIRECTED (Patient not taking: Reported on 01/05/2022) 204 each 1   metFORMIN (GLUCOPHAGE-XR) 500 MG 24 hr tablet Take 1 tablet (500 mg total) by mouth daily with breakfast. For diabetes. (Patient not taking: Reported on 01/05/2022) 90 tablet 3   Current Facility-Administered Medications on File Prior to Visit  Medication Dose Route Frequency Provider Last Rate Last Admin   heparin lock flush 100 unit/mL  500 Units Intracatheter Once PRN Earlie Server, MD       sodium chloride flush (NS) 0.9 % injection 10 mL  10 mL Intracatheter PRN Earlie Server, MD   10 mL at 10/06/17 1515    BP 122/72   Pulse 65   Temp 98.1 F (36.7 C) (Temporal)   Ht '5\' 6"'$  (1.676 m)   SpO2 95%   BMI 29.29 kg/m  Objective:   Physical Exam Constitutional:      Appearance: She is ill-appearing.  HENT:     Right Ear: Ear canal normal. Tympanic membrane is bulging. Tympanic membrane is not erythematous.     Left Ear: Ear canal normal. Tympanic membrane is retracted. Tympanic membrane is not erythematous.     Nose:     Right Sinus: Maxillary sinus  tenderness present. No frontal sinus tenderness.     Left Sinus: Maxillary sinus tenderness present. No frontal sinus tenderness.     Mouth/Throat:     Pharynx: No posterior oropharyngeal erythema.  Eyes:     Conjunctiva/sclera: Conjunctivae normal.  Cardiovascular:     Rate and Rhythm: Normal rate and regular rhythm.  Pulmonary:     Effort: Pulmonary effort is normal.     Breath sounds: Normal breath sounds. No wheezing or rales.  Musculoskeletal:     Cervical back: Neck supple.  Lymphadenopathy:     Cervical: No cervical adenopathy.  Skin:    General: Skin is warm and dry.           Assessment & Plan:   Problem List Items Addressed This Visit       Other   Sinus pressure - Primary    Suspect viral etiology at this point. Negative flu and Covid tests negative today.  Continue Advil-Tylenol. Add prednisone 40 mg daily x 5 days.  Consider antibiotic treatment if no improvement after 7 days.      Relevant Medications   predniSONE (DELTASONE) 20 MG tablet       Pleas [name], NP

## 2022-01-05 NOTE — Telephone Encounter (Signed)
[facility] [facility] - Client TELEPHONE ADVICE RECORD [facility] Patient Name: Debra Perez Gender: Female DOB: 04/22/1955 Age: 67 Y 10 M 18 D Return Phone Number: [phone] (Primary), [phone] (Secondary) Address[place]  [address] Client [place] [facility] - Client Client Site [facility] - Day Provider Alma Friendly - NP Contact Type Call Who Is Calling Patient / Member / Family / Caregiver Call Type Triage / Clinical Relationship To Patient Self Return Phone Number [phone] (Primary) Chief Complaint Dizziness Reason for Call Symptomatic / Request for [place] states she has a sinus infection. Head is spinning. Sore throat, nausea and dizziness. Symptoms started 2 days ago. Translation No Nurse Assessment Nurse: D'Heur Lucia Gaskins, RN, [place] Date/Time (Eastern Time): 01/05/2022 10:19:27 AM Confirm and document reason for call. If symptomatic, describe symptoms. ---Caller states she has a sinus infection. Sore throat, congestion w/ greenish-yellow mucus, popping ears, nausea and dizziness. Symptoms started 2 days ago. No fever. Does the patient have any new or worsening symptoms? ---Yes Will a triage be completed? ---Yes Related visit to physician within the last 2 weeks? ---No Does the PT have any chronic conditions? (i.e. diabetes, asthma, this includes High risk factors for pregnancy, etc.) ---Yes List chronic conditions. ---diabetes, HTN, high cholesterol, SVT, hypothyroid Is this a behavioral health or substance abuse call? ---No Guidelines Guideline Title Affirmed Question Affirmed Notes Nurse Date/Time (Eastern Time) COVID-19 - Diagnosed or Suspected [1] HIGH RISK patient (e.g., weak immune system, age > 15 years, obesity with BMI 30 or higher, pregnant, chronic lung disease or other chronic medical  condition) D'Heur Lucia Gaskins, RN, [place] 01/05/2022 10:23:00 AM PLEASE NOTE: All timestamps contained within this report are represented as [place] Standard Time. CONFIDENTIALTY NOTICE: This fax transmission is intended only for the addressee. It contains information that is legally privileged, confidential or otherwise protected from use or disclosure. If you are not the intended recipient, you are strictly prohibited from reviewing, disclosing, copying using or disseminating any of this information or taking any action in reliance on or regarding this information. If you have received this fax in error, please notify us immediately by telephone so that we can arrange for its return to [place]. Phone: [phone], Toll-Free: [phone], Fax: [phone] Page: 2 of 2 Call Id: [id number] Guidelines Guideline Title Affirmed Question Affirmed Notes Nurse Date/Time Eilene Ghazi Time) AND [2] COVID symptoms (e.g., cough, fever) (Exceptions: Already seen by PCP and no new or worsening symptoms.) Disp. Time Eilene Ghazi Time) Disposition Final User 01/05/2022 10:29:59 AM Call PCP within 24 Hours Yes [place], RN, [place] Final Disposition 01/05/2022 10:29:59 AM Call PCP within 24 Hours Yes D'Heur Lucia Gaskins, RN, [place] Caller Disagree/Comply Comply Caller Understands Yes PreDisposition Call Doctor Care Advice Given Per Guideline CALL PCP WITHIN 24 HOURS: * You need to discuss this with your doctor (or NP/PA) within the next 24 hours. * Feeling dehydrated: Drink extra liquids. If the air in your home is dry, use a humidifier. CALL BACK IF: * You become worse CARE ADVICE given per COVID-19 - DIAGNOSED OR SUSPECTED (Adult) guideline. Comments User: Vincente Liberty, D'Heur Lucia Gaskins, RN Date/Time Eilene Ghazi Time): 01/05/2022 10:25:03 AM Current O2SAT is 97

## 2022-01-05 NOTE — Patient Instructions (Signed)
Start prednisone 20 mg tablets. Take 2 tablets by mouth once daily in the morning for 5 days.  Continue Advil-Tylenol as needed.  Please update me in a few days.  It was a pleasure to see you today!

## 2022-01-05 NOTE — Telephone Encounter (Signed)
Pt is presently here for appt with Gentry Fitz NP. Sending to [facility] [employer].

## 2022-01-07 ENCOUNTER — Other Ambulatory Visit: Payer: Self-pay | Admitting: *Deleted

## 2022-01-07 MED ORDER — ROSUVASTATIN CALCIUM 5 MG PO TABS: ORAL_TABLET | ORAL | 3 refills | Status: DC

## 2022-01-16 ENCOUNTER — Ambulatory Visit (HOSPITAL_COMMUNITY)
Admission: RE | Admit: 2022-01-16 | Discharge: 2022-01-16 | Disposition: A | Discharge status: Home / Self Care | Payer: Managed Care, Other (non HMO) | Source: Ambulatory Visit | Attending: Emergency Medicine | Admitting: Emergency Medicine

## 2022-01-16 ENCOUNTER — Encounter (HOSPITAL_COMMUNITY): Payer: Self-pay

## 2022-01-16 VITALS — BP 125/63 | HR 79 | Temp 98.0°F | Resp 18

## 2022-01-16 DIAGNOSIS — J069 Acute upper respiratory infection, unspecified: Secondary | ICD-10-CM | POA: Diagnosis not present

## 2022-01-16 MED ORDER — ATROVENT HFA 17 MCG/ACT IN AERS: 2.0000 | INHALATION_SPRAY | RESPIRATORY_TRACT | 0 refills | Status: AC | PRN

## 2022-01-16 MED ORDER — PROMETHAZINE-DM 6.25-15 MG/5ML PO SYRP: 5.0000 mL | ORAL_SOLUTION | Freq: Every evening | ORAL | 0 refills | Status: DC | PRN

## 2022-01-16 MED ORDER — AMOXICILLIN-POT CLAVULANATE 875-125 MG PO TABS: 1.0000 | ORAL_TABLET | Freq: Two times a day (BID) | ORAL | 0 refills | Status: AC

## 2022-01-16 NOTE — ED Triage Notes (Signed)
Pt reports chest congestion, head congestion, coughing and wheezing x 1.5 weeks. Has been taking Mucinex DM and alternating Tylenol and Advil.

## 2022-01-16 NOTE — ED Provider Notes (Signed)
[place]    CSN: [id number] Arrival date & time: 01/16/22  1128      History   Chief Complaint Chief Complaint  Patient presents with   Cough    Chest congestion, head congestion, overall miserable. - Entered by patient   Chest Congestion   Wheezing    HPI Debra Perez is a 66 y.o. female.   Patient presents with nasal congestion, rhinorrhea, sinus pain and pressure, postnasal drip, a productive cough with green to yellow sputum and wheezing for 10 days.  No known sick contacts.  Tolerating food and liquids.  Has attempted use of Mucinex DM, Tylenol and Advil without relief.  Was evaluated on 01/05/2022 by PCP for sinus symptoms, prescribed prednisone.  Extensive medical history, review below.  Past Medical History:  Diagnosis Date   Carotid artery occlusion    20 % left side   Diabetes mellitus without complication (HCC)    Diverticulosis    GERD (gastroesophageal reflux disease)    History of colonic polyps    benign   Hyperlipidemia    Hypertension    Hypothyroidism    Lung infection    Mitral valve prolapse    NSVT (nonsustained ventricular tachycardia) (HCC)    Obstructive sleep apnea    on CPAP   PAF (paroxysmal atrial fibrillation) ([place])    a.  On Eliquis; b. 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   Varicose veins of both lower extremities 05/13/2019   Strain of lumbar region 01/08/2019   Left lower quadrant abdominal pain 08/06/2018   Iron deficiency anemia 10/04/2017   Mitral valve disorder 08/30/2017   Osteopenia 04/19/2017   Arthralgia 11/09/2016   Pleuritic chest pain    Chronic back pain    Coronary artery disease involving native coronary artery of native heart with angina pectoris ([place])    Atypical  chest pain 12/03/2015   SOB (shortness of breath) 08/07/2015   GERD (gastroesophageal reflux disease) 11/06/2014   NSVT (nonsustained ventricular tachycardia) ([place]) 11/06/2014   Preventative health care 11/05/2014   Paroxysmal atrial fibrillation ([place]) 09/17/2014   Hot flashes 09/17/2014   Type 2 diabetes mellitus ([place]) 08/21/2014   Essential hypertension 08/21/2014   Mixed hyperlipidemia 08/21/2014   Hypothyroidism 08/21/2014    Past Surgical History:  Procedure Laterality Date   ABDOMINAL HYSTERECTOMY  2002   ovaries remain   CLAVICLE SURGERY  2007   right clavicle plate pins   COLONOSCOPY WITH PROPOFOL N/A 10/17/2017   Procedure: COLONOSCOPY WITH PROPOFOL;  Surgeon: Jonathon Bellows, MD;  Location: [facility] ENDOSCOPY;  Service: Gastroenterology;  Laterality: N/A;   ESOPHAGOGASTRODUODENOSCOPY (EGD) WITH PROPOFOL N/A 10/17/2017   Procedure: ESOPHAGOGASTRODUODENOSCOPY (EGD) WITH PROPOFOL;  Surgeon: Jonathon Bellows, MD;  Location: [facility] ENDOSCOPY;  Service: Gastroenterology;  Laterality: N/A;   GIVENS CAPSULE STUDY N/A 11/30/2017   Procedure: GIVENS CAPSULE STUDY;  Surgeon: Jonathon Bellows, MD;  Location: [facility] ENDOSCOPY;  Service: Gastroenterology;  Laterality: N/A;   KNEE ARTHROSCOPY  2012    OB History   No obstetric history on file.      Home Medications    Prior to Admission medications   Medication Sig Start Date End Date Taking? Authorizing Provider  Accu-Chek FastClix Lancets MISC USE TO  TEST BLOOD SUGAR TWICE DAILY AS DIRECTED Patient not taking: Reported on 01/05/2022 07/28/21   Pleas [name], NP  [name] 50 MG tablet TAKE 1 TABLET BY MOUTH AT BEDTIME FOR BACK PAIN 11/22/20   Pleas [name], NP  apixaban (ELIQUIS) 5 MG TABS tablet Take 5 mg by mouth 2 (two) times daily. 12/17/21   [provider]  b complex vitamins capsule Take 1 capsule by mouth 2 (two) times daily.    [provider]  Continuous Blood Gluc Sensor (FREESTYLE LIBRE 3 SENSOR) MISC  Place 1 sensor on the skin every 14 days. Use to check glucose continuously 11/15/21   Pleas [name], NP  ezetimibe (ZETIA) 10 MG tablet TAKE 1 TABLET BY MOUTH DAILY GENERIC EQUIVALENT FOR ZETIA 08/04/21   Minna Merritts, MD  famotidine (PEPCID) 20 MG tablet TAKE 1 TABLET BY MOUTH TWICE DAILY FOR HEARTBURN 10/26/21   Pleas [name], NP  gentamicin ointment (GARAMYCIN) 0.1 % Apply 1 application topically daily as needed. For nasal sore. 10/13/20   Pleas [name], NP  glipiZIDE (GLUCOTROL XL) 10 MG 24 hr tablet TAKE 1 TABLET BY MOUTH DAILY WITH BREAKFAST FOR DIABETES 10/26/21   Pleas [name], NP  hydrochlorothiazide (HYDRODIURIL) 25 MG tablet Take 1 tablet (25 mg total) by mouth daily. Please schedule yearly appointment for October 2023 for future refills. Thank you 11/16/21 02/14/22  Minna Merritts, MD  HYDROcodone-acetaminophen (NORCO/VICODIN) 5-325 MG tablet Take 1 tablet by mouth every 6 (six) hours as needed for moderate pain. 12/30/21   Edrick Kins, DPM  Iron-Vitamin C 65-125 MG TABS Take by mouth daily.     [provider]  JANUMET 50-1000 MG tablet TAKE 1 TABLET BY MOUTH 2 TIMES DAILY WITH A MEAL FOR DIABETES 11/16/21   Pleas [name], NP  lisinopril (ZESTRIL) 20 MG tablet Take 1 tablet (20 mg total) by mouth daily. 12/28/21   Minna Merritts, MD  Magnesium 500 MG CAPS Take 1 capsule by mouth 2 (two) times daily.    [provider]  metFORMIN (GLUCOPHAGE-XR) 500 MG 24 hr tablet Take 1 tablet (500 mg total) by mouth daily with breakfast. For diabetes. Patient not taking: Reported on 01/05/2022 02/21/21   Pleas [name], NP  methocarbamol (ROBAXIN) 750 MG tablet Take 375-750 mg by mouth at bedtime as needed. 08/31/21   [provider]  metoprolol succinate (TOPROL-XL) 25 MG 24 hr tablet Take 1 tablet (25 mg total) by mouth 2 (two) times daily. Take with or immediately following a meal. Please schedule yearly appointment for October to receive  future refills. Thank you. 11/16/21   Minna Merritts, MD  Multiple Vitamins-Minerals (MULTIVITAMIN ADULT PO) Take 1 capsule by mouth daily.     [provider]  predniSONE (DELTASONE) 20 MG tablet Take 2 tablets by mouth once daily in the morning for 5 days. 01/05/22   Pleas [name], NP  propafenone (RYTHMOL SR) 225 MG 12 hr capsule Take 1 capsule (225 mg total) by mouth 2 (two) times daily. 12/13/21   Minna Merritts, MD  rosuvastatin (CRESTOR) 5 MG tablet TAKE 1 TABLET BY MOUTH DAILY GENERIC EQUIVALENT FOR CRESTOR 01/07/22   Minna Merritts, MD  SYNTHROID 88 MCG tablet Take 1 tablet by mouth every morning on an empty stomach with water only.  No food or other medications for 30 minutes. 12/03/21   Pleas [name], NP  venlafaxine XR (EFFEXOR-XR) 37.5 MG 24 hr capsule TAKE  ONE CAPSULE BY MOUTH DAILY WITH BREAKFAST FOR HOT FLASHES 12/19/21   Pleas [name], NP  verapamil (CALAN-SR) 240 MG CR tablet Take 1 tablet (240 mg total) by mouth daily. 10/26/21   Minna Merritts, MD    Family History Family History  Problem Relation Age of Onset   Depression Mother    Sudden death Mother 58   Heart attack Mother    Arthritis Father    Heart disease Father    Hypertension Father    Emphysema Father    Bladder Cancer Sister    Diabetes Sister        Type 2   Diabetes Brother        type   Hypertension Brother    Emphysema Brother    Heart disease Brother    Diabetes Maternal Grandfather    Bladder Cancer Maternal Grandmother        sinus cancer    Breast cancer Paternal Grandmother    Cancer Paternal Grandfather        colon cancer   Diabetes Paternal Grandfather     Social History Social History   Tobacco Use   Smoking status: Former    Packs/day: 1.00    Years: 20.00    Total pack years: 20.00    Types: Cigarettes    Quit date: 03/07/2004    Years since quitting: 17.8   Smokeless tobacco: Never  Vaping Use   Vaping Use: Never used  Substance Use Topics    Alcohol use: No    Alcohol/week: 0.0 standard drinks of alcohol   Drug use: No     Allergies   Patient has no known allergies.   Review of Systems Review of Systems  Constitutional: Negative.   HENT:  Positive for congestion, rhinorrhea, sinus pressure and sinus pain. Negative for dental problem, drooling, ear discharge, ear pain, facial swelling, hearing loss, mouth sores, nosebleeds, postnasal drip, sneezing, sore throat, tinnitus, trouble swallowing and voice change.   Respiratory:  Positive for cough and wheezing. Negative for apnea, choking, chest tightness, shortness of breath and stridor.   Cardiovascular: Negative.   Gastrointestinal: Negative.   Skin: Negative.   Neurological: Negative.      Physical Exam Triage Vital Signs ED Triage Vitals  Enc Vitals Group     BP 01/16/22 1231 125/63     Pulse Rate 01/16/22 1231 79     Resp 01/16/22 1231 18     Temp 01/16/22 1231 98 F (36.7 C)     Temp Source 01/16/22 1231 Oral     SpO2 01/16/22 1231 97 %     Weight --      Height --      Head Circumference --      Peak Flow --      Pain Score 01/16/22 1229 3     Pain Loc --      Pain Edu? --      Excl. in [place]? --    No data found.  Updated Vital Signs BP 125/63 (BP Location: Right Arm)   Pulse 79   Temp 98 F (36.7 C) (Oral)   Resp 18   SpO2 97%   Visual Acuity Right Eye Distance:   Left Eye Distance:   Bilateral Distance:    Right Eye Near:   Left Eye Near:    Bilateral Near:     Physical Exam Constitutional:      Appearance: Normal appearance.  HENT:     Head: Normocephalic.  Right Ear: Tympanic membrane, ear canal and external ear normal.     Left Ear: Tympanic membrane, ear canal and external ear normal.     Nose: Congestion present.     Mouth/Throat:     Mouth: Mucous membranes are moist.  Eyes:     Extraocular Movements: Extraocular movements intact.  Cardiovascular:     Rate and Rhythm: Normal rate and regular rhythm.     Pulses:  Normal pulses.     Heart sounds: Normal heart sounds.  Pulmonary:     Effort: Pulmonary effort is normal.     Breath sounds: Wheezing present.  Neurological:     Mental Status: She is alert and oriented to person, place, and time. Mental status is at baseline.      UC Treatments / Results  Labs (all labs ordered are listed, but only abnormal results are displayed) Labs Reviewed - No data to display  EKG   Radiology No results found.  Procedures Procedures (including critical care time)  Medications Ordered in UC Medications - No data to display  Initial Impression / Assessment and Plan / UC Course  I have reviewed the triage vital signs and the nursing notes.  Pertinent labs & imaging results that were available during my care of the patient were reviewed by me and considered in my medical decision making (see chart for details).  Acute upper respiratory infection  Vital signs are stable with O2 saturation at 97% on room air, wheezing is heard to the bilateral upper lobes with a harsh cough witnessed within office however patient is stable for outpatient treatment and discussed findings, will defer viral testing due to timeline of illness and as symptoms have persisted for 10 days without signs of resolution we will provide bacterial coverage, Augmentin 10-day course prescribed as well as Promethazine DM for bedtime and failed ipratropium inhaler, may continue use of over-the-counter medications as needed for additional support with follow-up with urgent care or PCP if symptoms persist or worsen Final Clinical Impressions(s) / UC Diagnoses   Final diagnoses:  None   Discharge Instructions   None    ED Prescriptions   None    PDMP not reviewed this encounter.   Hans Eden, NP 01/16/22 1241

## 2022-01-16 NOTE — Discharge Instructions (Signed)
Begin Augmentin every morning and every evening for 10 days, ideally you will start to see improvement in about 48 hours and steady progression from there  Inhaler has been refilled, may take 2 puffs every 4-6 hours as needed for wheezing and shortness of breath  You may use Promethazine DM at bedtime to help calm your coughing, be mindful this will make you drowsy   You can take Tylenol and/or Ibuprofen as needed for fever reduction and pain relief.   For cough: honey 1/2 to 1 teaspoon (you can dilute the honey in water or another fluid).  You can also use guaifenesin and dextromethorphan for cough. You can use a humidifier for chest congestion and cough.  If you don't have a humidifier, you can sit in the bathroom with the hot shower running.      For sore throat: try warm salt water gargles, cepacol lozenges, throat spray, warm tea or water with lemon/honey, popsicles or ice, or OTC cold relief medicine for throat discomfort.   For congestion: take a daily anti-histamine like Zyrtec, Claritin, and a oral decongestant, such as pseudoephedrine.  You can also use Flonase 1-2 sprays in each nostril daily.   It is important to stay hydrated: drink plenty of fluids (water, gatorade/powerade/pedialyte, juices, or teas) to keep your throat moisturized and help further relieve irritation/discomfort.

## 2022-01-17 ENCOUNTER — Telehealth: Payer: Self-pay

## 2022-01-17 ENCOUNTER — Other Ambulatory Visit: Payer: Self-pay

## 2022-01-17 DIAGNOSIS — E1159 Type 2 diabetes mellitus with other circulatory complications: Secondary | ICD-10-CM

## 2022-01-17 MED ORDER — GLIPIZIDE ER 10 MG PO TB24: ORAL_TABLET | ORAL | 1 refills | Status: DC

## 2022-01-17 MED ORDER — VERAPAMIL HCL ER 240 MG PO TBCR: 240.0000 mg | EXTENDED_RELEASE_TABLET | Freq: Every day | ORAL | 2 refills | Status: DC

## 2022-01-17 NOTE — Addendum Note (Signed)
Addended by: Pleas [name] on: 01/17/2022 05:35 PM   Modules accepted: Orders

## 2022-01-17 NOTE — Addendum Note (Signed)
Addended by: Pleas [name] on: 01/17/2022 05:34 PM   Modules accepted: Orders

## 2022-01-17 NOTE — Telephone Encounter (Signed)
Patient no longer taking. Refill request declined.

## 2022-02-02 ENCOUNTER — Ambulatory Visit (INDEPENDENT_AMBULATORY_CARE_PROVIDER_SITE_OTHER): Payer: Managed Care, Other (non HMO) | Admitting: Primary Care

## 2022-02-02 ENCOUNTER — Encounter: Payer: Self-pay | Admitting: Primary Care

## 2022-02-02 VITALS — BP 122/66 | HR 78 | Temp 97.7°F | Ht 66.0 in | Wt 184.0 lb

## 2022-02-02 DIAGNOSIS — R310 Gross hematuria: Secondary | ICD-10-CM | POA: Insufficient documentation

## 2022-02-02 HISTORY — DX: Gross hematuria: R31.0

## 2022-02-02 LAB — POC URINALSYSI DIPSTICK (AUTOMATED)
Bilirubin, UA: NEGATIVE
Blood, UA: POSITIVE
Glucose, UA: NEGATIVE
Ketones, UA: NEGATIVE
Nitrite, UA: NEGATIVE
Protein, UA: NEGATIVE
Spec Grav, UA: 1.01 (ref 1.010–1.025)
Urobilinogen, UA: 0.2 E.U./dL
pH, UA: 7 (ref 5.0–8.0)

## 2022-02-02 MED ORDER — SULFAMETHOXAZOLE-TRIMETHOPRIM 800-160 MG PO TABS: 1.0000 | ORAL_TABLET | Freq: Two times a day (BID) | ORAL | 0 refills | Status: DC

## 2022-02-02 NOTE — Assessment & Plan Note (Addendum)
Symptoms more representative of cystitis. Low suspicion for pyelonephritis and renal stone.  Patient unable to provide urine sample in the office today. We will have her return later when ready.   Update at 4:27 pm: UA today with 1+ leuks, positive blood. Given her symptoms, coupled with UA findings we will proceed with treatment. Culture sent and pending.  Start Bactrim DS (sulfamethoxazole/trimethoprim) tablets for urinary tract infection. Take 1 tablet by mouth twice daily for 3 days. She will update if no improvement.

## 2022-02-02 NOTE — Progress Notes (Signed)
Subjective:    Patient ID: Debra Perez, female    DOB: 1955-07-11, 66 y.o.   MRN: [id number]  Hematuria Irritative symptoms include frequency and urgency. Pertinent negatives include no abdominal pain, dysuria or nausea.    Debra Perez is a very pleasant 66 y.o. female with a history of hypertension, paroxysmal atrial fibrillation, type 2 diabetes, CAD, hypothyroidism who presents today to discuss hematuria.   Symptom onset four days ago with foul smelling urine and more concentrated urine. Two days ago she noticed gross hematuria, urinary frequency, urinary urgency, pelvic cramping, and bilateral lower thoracic back pain.  She's been taking AZO over the last few days, last dose was about 24 hours ago.   She denies fevers, vaginal itching, vaginal discharge. She wasn't drinking enough water prior to symptom onset as she's been busy with family matters.    Review of Systems  Gastrointestinal:  Negative for abdominal pain and nausea.  Genitourinary:  Positive for frequency, hematuria, pelvic pain and urgency. Negative for dysuria and vaginal discharge.  Musculoskeletal:  Positive for back pain.         Past Medical History:  Diagnosis Date   Carotid artery occlusion    20 % left side   Diabetes mellitus without complication (HCC)    Diverticulosis    GERD (gastroesophageal reflux disease)    History of colonic polyps    benign   Hyperlipidemia    Hypertension    Hypothyroidism    Lung infection    Mitral valve prolapse    NSVT (nonsustained ventricular tachycardia) (HCC)    Obstructive sleep apnea    on CPAP   PAF (paroxysmal atrial fibrillation) ([place])    a.  On Eliquis; b. CHADS2VASc => 3 (HTN, DM, female)   Sleep apnea     Social History   Socioeconomic History   Marital status: Married    Spouse name: Not on file   Number of children: Not on file   Years of education: Not on file   Highest education level: Not on file  Occupational History    Occupation: retired  Tobacco Use   Smoking status: Former    Packs/day: 1.00    Years: 20.00    Total pack years: 20.00    Types: Cigarettes    Quit date: 03/07/2004    Years since quitting: 17.9   Smokeless tobacco: Never  Vaping Use   Vaping Use: Never used  Substance and Sexual Activity   Alcohol use: No    Alcohol/week: 0.0 standard drinks of alcohol   Drug use: No   Sexual activity: Not on file  Other Topics Concern   Not on file  Social History Narrative   Married.   1 daughter.   Moved here from [place] due to husbands occupation.   She is a Corporate treasurer.   Enjoys sewing, gardening.   Social Determinants of Health   Financial Resource Strain: Not on file  Food Insecurity: Not on file  Transportation Needs: Not on file  Physical Activity: Not on file  Stress: Not on file  Social Connections: Not on file  Intimate Partner Violence: Not on file    Past Surgical History:  Procedure Laterality Date   ABDOMINAL HYSTERECTOMY  2002   ovaries remain   CLAVICLE SURGERY  2007   right clavicle plate pins   COLONOSCOPY WITH PROPOFOL N/A 10/17/2017   Procedure: COLONOSCOPY WITH PROPOFOL;  Surgeon: Jonathon Bellows, MD;  Location: [facility] ENDOSCOPY;  Service: Gastroenterology;  Laterality: N/A;  ESOPHAGOGASTRODUODENOSCOPY (EGD) WITH PROPOFOL N/A 10/17/2017   Procedure: ESOPHAGOGASTRODUODENOSCOPY (EGD) WITH PROPOFOL;  Surgeon: Jonathon Bellows, MD;  Location: [facility] ENDOSCOPY;  Service: Gastroenterology;  Laterality: N/A;   GIVENS CAPSULE STUDY N/A 11/30/2017   Procedure: GIVENS CAPSULE STUDY;  Surgeon: Jonathon Bellows, MD;  Location: [facility] ENDOSCOPY;  Service: Gastroenterology;  Laterality: N/A;   KNEE ARTHROSCOPY  2012    Family History  Problem Relation Age of Onset   Depression Mother    Sudden death Mother 36   Heart attack Mother    Arthritis Father    Heart disease Father    Hypertension Father    Emphysema Father    Bladder Cancer Sister    Diabetes Sister        Type 2   Diabetes  Brother        type   Hypertension Brother    Emphysema Brother    Heart disease Brother    Diabetes Maternal Grandfather    Bladder Cancer Maternal Grandmother        sinus cancer    Breast cancer Paternal Grandmother    Cancer Paternal Grandfather        colon cancer   Diabetes Paternal Grandfather     No Known Allergies  Current Outpatient Medications on File Prior to Visit  Medication Sig Dispense Refill   Accu-Chek FastClix Lancets MISC USE TO TEST BLOOD SUGAR TWICE DAILY AS DIRECTED 204 each 1   [name] 50 MG tablet TAKE 1 TABLET BY MOUTH AT BEDTIME FOR BACK PAIN 90 tablet 3   b complex vitamins capsule Take 1 capsule by mouth 2 (two) times daily.     Continuous Blood Gluc Sensor (FREESTYLE LIBRE 3 SENSOR) MISC Place 1 sensor on the skin every 14 days. Use to check glucose continuously 6 each 1   ezetimibe (ZETIA) 10 MG tablet TAKE 1 TABLET BY MOUTH DAILY GENERIC EQUIVALENT FOR ZETIA 90 tablet 3   famotidine (PEPCID) 20 MG tablet TAKE 1 TABLET BY MOUTH TWICE DAILY FOR HEARTBURN 180 tablet 0   gentamicin ointment (GARAMYCIN) 0.1 % Apply 1 application topically daily as needed. For nasal sore. 15 g 0   glipiZIDE (GLUCOTROL XL) 10 MG 24 hr tablet TAKE 1 TABLET BY MOUTH DAILY WITH BREAKFAST FOR DIABETES 90 tablet 1   hydrochlorothiazide (HYDRODIURIL) 25 MG tablet Take 1 tablet (25 mg total) by mouth daily. Please schedule yearly appointment for October 2023 for future refills. Thank you 90 tablet 0   ipratropium (ATROVENT HFA) 17 MCG/ACT inhaler Inhale 2 puffs into the lungs every 4 (four) hours as needed for wheezing. 1 each 0   Iron-Vitamin C 65-125 MG TABS Take by mouth daily.      JANUMET 50-1000 MG tablet TAKE 1 TABLET BY MOUTH 2 TIMES DAILY WITH A MEAL FOR DIABETES 180 tablet 0   lisinopril (ZESTRIL) 20 MG tablet Take 1 tablet (20 mg total) by mouth daily. 90 tablet 3   Magnesium 500 MG CAPS Take 1 capsule by mouth 2 (two) times daily.     methocarbamol  (ROBAXIN) 750 MG tablet Take 375-750 mg by mouth at bedtime as needed.     metoprolol succinate (TOPROL-XL) 25 MG 24 hr tablet Take 1 tablet (25 mg total) by mouth 2 (two) times daily. Take with or immediately following a meal. Please schedule yearly appointment for October to receive future refills. Thank you. 120 tablet 0   Multiple Vitamins-Minerals (MULTIVITAMIN ADULT PO) Take 1 capsule by mouth daily.  propafenone (RYTHMOL SR) 225 MG 12 hr capsule Take 1 capsule (225 mg total) by mouth 2 (two) times daily. 180 capsule 0   rosuvastatin (CRESTOR) 5 MG tablet TAKE 1 TABLET BY MOUTH DAILY GENERIC EQUIVALENT FOR CRESTOR 90 tablet 3   SYNTHROID 88 MCG tablet Take 1 tablet by mouth every morning on an empty stomach with water only.  No food or other medications for 30 minutes. 90 tablet 3   venlafaxine XR (EFFEXOR-XR) 37.5 MG 24 hr capsule TAKE ONE CAPSULE BY MOUTH DAILY WITH BREAKFAST FOR HOT FLASHES 90 capsule 3   verapamil (CALAN-SR) 240 MG CR tablet Take 1 tablet (240 mg total) by mouth daily. 90 tablet 2   apixaban (ELIQUIS) 5 MG TABS tablet Take 5 mg by mouth 2 (two) times daily. (Patient not taking: Reported on 02/02/2022)     HYDROcodone-acetaminophen (NORCO/VICODIN) 5-325 MG tablet Take 1 tablet by mouth every 6 (six) hours as needed for moderate pain. (Patient not taking: Reported on 02/02/2022) 20 tablet 0   predniSONE (DELTASONE) 20 MG tablet Take 2 tablets by mouth once daily in the morning for 5 days. (Patient not taking: Reported on 02/02/2022) 10 tablet 0   promethazine-dextromethorphan (PROMETHAZINE-DM) 6.25-15 MG/5ML syrup Take 5 mLs by mouth at bedtime as needed for cough. (Patient not taking: Reported on 02/02/2022) 118 mL 0   Current Facility-Administered Medications on File Prior to Visit  Medication Dose Route Frequency Provider Last Rate Last Admin   heparin lock flush 100 unit/mL  500 Units Intracatheter Once PRN Earlie Server, MD       sodium chloride flush (NS) 0.9 % injection  10 mL  10 mL Intracatheter PRN Earlie Server, MD   10 mL at 10/06/17 1515    BP 122/66   Pulse 78   Temp 97.7 F (36.5 C) (Temporal)   Ht '5\' 6"'$  (1.676 m)   Wt 184 lb (83.5 kg)   SpO2 94%   BMI 29.70 kg/m  Objective:   Physical Exam Constitutional:      General: She is not in acute distress. Cardiovascular:     Rate and Rhythm: Normal rate and regular rhythm.  Pulmonary:     Effort: Pulmonary effort is normal.  Abdominal:     Tenderness: There is no right CVA tenderness or left CVA tenderness.           Assessment & Plan:   Problem List Items Addressed This Visit       Genitourinary   Gross hematuria - Primary    Symptoms more representative of cystitis. Low suspicion for pyelonephritis and renal stone.  Patient unable to provide urine sample in the office today. We will have her return later when ready.   Update at 4:27 pm: UA today with 1+ leuks, positive blood. Given her symptoms, coupled with UA findings we will proceed with treatment. Culture sent and pending.  Start Bactrim DS (sulfamethoxazole/trimethoprim) tablets for urinary tract infection. Take 1 tablet by mouth twice daily for 3 days. She will update if no improvement.       Relevant Medications   sulfamethoxazole-trimethoprim (BACTRIM DS) 800-160 MG tablet   Other Relevant Orders   POCT Urinalysis Dipstick (Automated) (Completed)   Urine Culture       Pleas [name], NP

## 2022-02-03 MED ORDER — NITROFURANTOIN MONOHYD MACRO 100 MG PO CAPS: 100.0000 mg | ORAL_CAPSULE | Freq: Two times a day (BID) | ORAL | 0 refills | Status: AC

## 2022-02-05 ENCOUNTER — Other Ambulatory Visit: Payer: Self-pay | Admitting: Primary Care

## 2022-02-05 DIAGNOSIS — G8929 Other chronic pain: Secondary | ICD-10-CM

## 2022-02-05 LAB — URINE CULTURE
MICRO NUMBER:: 14246200
SPECIMEN QUALITY:: ADEQUATE

## 2022-02-06 ENCOUNTER — Other Ambulatory Visit: Payer: Self-pay | Admitting: Primary Care

## 2022-02-06 DIAGNOSIS — G8929 Other chronic pain: Secondary | ICD-10-CM

## 2022-02-06 MED ORDER — AMITRIPTYLINE HCL 50 MG PO TABS: 50.0000 mg | ORAL_TABLET | Freq: Every day | ORAL | 2 refills | Status: DC

## 2022-02-07 ENCOUNTER — Other Ambulatory Visit: Payer: Self-pay

## 2022-02-07 ENCOUNTER — Ambulatory Visit (INDEPENDENT_AMBULATORY_CARE_PROVIDER_SITE_OTHER): Payer: Managed Care, Other (non HMO)

## 2022-02-07 ENCOUNTER — Ambulatory Visit (INDEPENDENT_AMBULATORY_CARE_PROVIDER_SITE_OTHER): Payer: Managed Care, Other (non HMO) | Admitting: Podiatry

## 2022-02-07 DIAGNOSIS — S92002A Unspecified fracture of left calcaneus, initial encounter for closed fracture: Secondary | ICD-10-CM

## 2022-02-07 MED ORDER — HYDROCHLOROTHIAZIDE 25 MG PO TABS: 25.0000 mg | ORAL_TABLET | Freq: Every day | ORAL | 0 refills | Status: DC

## 2022-02-07 NOTE — Progress Notes (Signed)
Chief Complaint  Patient presents with   Foot Injury    Patient states foot is better since the last visit , she states that there is still some pain but not like before. No swelling or redness.      HPI: 66 y.o. female presenting today for follow-up evaluation of a Jones fracture.  Patient states that overall she feels much better.  She has been weightbearing as tolerated in the cam boot.  No new complaints at this time  Past Medical History:  Diagnosis Date   Carotid artery occlusion    20 % left side   Diabetes mellitus without complication (HCC)    Diverticulosis    GERD (gastroesophageal reflux disease)    History of colonic polyps    benign   Hyperlipidemia    Hypertension    Hypothyroidism    Lung infection    Mitral valve prolapse    NSVT (nonsustained ventricular tachycardia) (HCC)    Obstructive sleep apnea    on CPAP   PAF (paroxysmal atrial fibrillation) ([place])    a.  On Eliquis; b. CHADS2VASc => 3 (HTN, DM, female)   Sleep apnea     Past Surgical History:  Procedure Laterality Date   ABDOMINAL HYSTERECTOMY  2002   ovaries remain   CLAVICLE SURGERY  2007   right clavicle plate pins   COLONOSCOPY WITH PROPOFOL N/A 10/17/2017   Procedure: COLONOSCOPY WITH PROPOFOL;  Surgeon: Jonathon Bellows, MD;  Location: [facility] ENDOSCOPY;  Service: Gastroenterology;  Laterality: N/A;   ESOPHAGOGASTRODUODENOSCOPY (EGD) WITH PROPOFOL N/A 10/17/2017   Procedure: ESOPHAGOGASTRODUODENOSCOPY (EGD) WITH PROPOFOL;  Surgeon: Jonathon Bellows, MD;  Location: [facility] ENDOSCOPY;  Service: Gastroenterology;  Laterality: N/A;   GIVENS CAPSULE STUDY N/A 11/30/2017   Procedure: GIVENS CAPSULE STUDY;  Surgeon: Jonathon Bellows, MD;  Location: [facility] ENDOSCOPY;  Service: Gastroenterology;  Laterality: N/A;   KNEE ARTHROSCOPY  2012    No Known Allergies   Physical Exam: General: The patient is alert and oriented x3 in no acute distress.  Dermatology: Skin is warm, dry and supple bilateral lower extremities.  Negative for open lesions or macerations.  Vascular: Palpable pedal pulses bilaterally. Capillary refill within normal limits.    Neurological: Light touch and protective threshold grossly intact  Musculoskeletal Exam: No pedal deformities noted.  There is some moderate edema with tenderness to the lateral aspect of the left foot around the fifth metatarsal tubercle area  Radiographic Exam LT foot 02/07/2022:  Normal osseous mineralization.  Closed, nondisplaced, extra-articular transverse fracture at the metaphyseal diaphyseal junction of the fifth metatarsal base consistent with a Jones fracture.  Increased fracture gap approximately 2 mm.  Assessment: 1.  Metatarsal Jones fracture LT foot; closed, mildly displaced, subsequent encounter   Plan of Care:  1. Patient evaluated. X-Rays reviewed demonstrating the Jones fracture with increased fracture gap.  2.  Patient does admit to excessive ambulating but she has been wearing the cam boot.  She says that she has minimal tenderness or pain to the area.  For now we will continue conservative management.  I did discuss the possibility of surgery however she is minimally symptomatic and okay to be patient throughout the healing process 3.  Continue minimal WBAT cam boot 4.  Return to clinic 6 weeks follow-up x-ray     Edrick Kins, DPM [facility]  Dr. Edrick Kins, DPM    [address]  [place], [place] [address]                Office [phone]  Fax [phone]

## 2022-02-08 ENCOUNTER — Other Ambulatory Visit: Payer: Self-pay | Admitting: Primary Care

## 2022-02-08 ENCOUNTER — Encounter: Payer: Self-pay | Admitting: Podiatry

## 2022-02-08 ENCOUNTER — Ambulatory Visit
Admission: RE | Admit: 2022-02-08 | Discharge: 2022-02-08 | Disposition: A | Discharge status: Home / Self Care | Payer: Managed Care, Other (non HMO) | Source: Ambulatory Visit | Attending: Primary Care | Admitting: Primary Care

## 2022-02-08 ENCOUNTER — Telehealth: Payer: Self-pay | Admitting: Podiatry

## 2022-02-08 DIAGNOSIS — Z1231 Encounter for screening mammogram for malignant neoplasm of breast: Secondary | ICD-10-CM | POA: Diagnosis present

## 2022-02-08 DIAGNOSIS — N63 Unspecified lump in unspecified breast: Secondary | ICD-10-CM

## 2022-02-08 DIAGNOSIS — R928 Other abnormal and inconclusive findings on diagnostic imaging of breast: Secondary | ICD-10-CM

## 2022-02-08 NOTE — Telephone Encounter (Signed)
   Spoke with patient today via telephone.  Patient states that she and her husband discussed surgery yesterday evening after our office visit and she would like to proceed with ORIF of the fracture of the fifth metatarsal.  She is concerned for the prolonged healing and possible nonunion.  Patient also states that after her visit she did do some prolonged walking and she has had excruciating pain and tenderness associated to the fracture site.  I do agree with the patient.  I do believe that ORIF with reduction of the fracture and internal fixation is appropriate.  Risk benefits advantages and disadvantages were all explained.  No guarantees were expressed or implied.  Patient questions answered.  Patient understands that she will be approximately 6 weeks nonweightbearing postoperatively in the cam boot.  Surgery scheduler will contact patient to schedule surgery.  We will sign the surgical consent forms morning of surgery at the surgery center.  Surgery will consist of open reduction internal fixation fifth metatarsal fracture left foot.  Return to clinic 1 week postop  Edrick Kins, DPM [facility]  Dr. Edrick Kins, DPM    [address], [place] [address]                Office [phone]  Fax [phone]

## 2022-02-08 NOTE — Telephone Encounter (Signed)
Debra Perez, This patient has decided to go ahead with surgery for her Jones fracture left. She can sign the consent forms at the surgery center. No medical clearance. Surgery is "Open reduction internal fixation fifth metatarsal fracture left". Paragon28 4.21m screw. 1 hour. Could you call her to schedule?  THANKS!!! -Dr. EAmalia Hailey

## 2022-02-12 ENCOUNTER — Encounter: Payer: Self-pay | Admitting: Podiatry

## 2022-02-13 ENCOUNTER — Other Ambulatory Visit: Payer: Self-pay | Admitting: Podiatry

## 2022-02-13 MED ORDER — CEPHALEXIN 500 MG PO CAPS: 500.0000 mg | ORAL_CAPSULE | Freq: Three times a day (TID) | ORAL | 0 refills | Status: DC

## 2022-02-13 NOTE — Progress Notes (Signed)
[id number] 

## 2022-02-13 NOTE — Telephone Encounter (Signed)
I called the patient. She states it is a little open, but no drainage. She feels like something is sticking her. Given the increased redness I sent keflex to the pharmacy. I want her to pad this really well as I think it could be rubbing the boot. She is asking about moving her surgery up if she needs to. I told her I would send a message to Dr. Amalia Hailey to determine the next steps. She probably should come in to be seen with him.

## 2022-02-14 ENCOUNTER — Ambulatory Visit: Payer: Managed Care, Other (non HMO) | Admitting: Podiatry

## 2022-02-14 ENCOUNTER — Telehealth: Payer: Self-pay | Admitting: Podiatry

## 2022-02-14 NOTE — Telephone Encounter (Signed)
Larene Beach or Estill Bamberg,  Could we please call this patient and see if we can work her in this week, possibly even today? Thanks, Dr. Amalia Hailey

## 2022-02-14 NOTE — Telephone Encounter (Signed)
DOS: 03/17/2022  [employer]  ORIF 5th Rt ([id number])  Deductible: $1,600 with $0 met Out-of-Pocket: $3,000 with $0 met CoInsurance: 10%  Prior authorization is not required per automated phone system.  Call Reference #: [id number]

## 2022-02-16 ENCOUNTER — Ambulatory Visit (INDEPENDENT_AMBULATORY_CARE_PROVIDER_SITE_OTHER): Payer: Managed Care, Other (non HMO) | Admitting: Podiatry

## 2022-02-16 ENCOUNTER — Ambulatory Visit: Payer: Managed Care, Other (non HMO)

## 2022-02-16 DIAGNOSIS — S92002A Unspecified fracture of left calcaneus, initial encounter for closed fracture: Secondary | ICD-10-CM | POA: Diagnosis not present

## 2022-02-16 NOTE — Progress Notes (Signed)
Chief Complaint  Patient presents with   Foot Pain    Patient is here for left foot pain and swelling.    HPI: 66 y.o. female presenting today for follow-up evaluation of a Jones fracture.  Since last visit the patient had developed some redness with irritation of the skin to the lateral aspect of the left foot.  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  On Eliquis; b. CHADS2VASc => 3 (HTN, DM, female)   Sleep apnea     Past Surgical History:  Procedure Laterality Date   ABDOMINAL HYSTERECTOMY  2002   ovaries remain   CLAVICLE SURGERY  2007   right clavicle plate pins   COLONOSCOPY WITH PROPOFOL N/A 10/17/2017   Procedure: COLONOSCOPY WITH PROPOFOL;  Surgeon: Jonathon Bellows, MD;  Location: [facility] ENDOSCOPY;  Service: Gastroenterology;  Laterality: N/A;   ESOPHAGOGASTRODUODENOSCOPY (EGD) WITH PROPOFOL N/A 10/17/2017   Procedure: ESOPHAGOGASTRODUODENOSCOPY (EGD) WITH PROPOFOL;  Surgeon: Jonathon Bellows, MD;  Location: [facility] ENDOSCOPY;  Service: Gastroenterology;  Laterality: N/A;   GIVENS CAPSULE STUDY N/A 11/30/2017   Procedure: GIVENS CAPSULE STUDY;  Surgeon: Jonathon Bellows, MD;  Location: [facility] ENDOSCOPY;  Service: Gastroenterology;  Laterality: N/A;   KNEE ARTHROSCOPY  2012    No Known Allergies   Physical Exam: General: The patient is alert and oriented x3 in no acute distress.  Dermatology: Skin is warm, dry and supple bilateral lower extremities. Negative for open lesions or macerations.  Vascular: Palpable  pedal pulses bilaterally. Capillary refill within normal limits.    Neurological: Light touch and protective threshold grossly intact  Musculoskeletal Exam: No pedal deformities noted.  Localized erythema with edema noted to the lateral aspect of the left foot around the fifth metatarsal tubercle area secondary to likely rubbing/irritation from either the boot or postsurgical shoe  Radiographic Exam LT foot 02/07/2022:  Normal osseous mineralization.  Closed, nondisplaced, extra-articular transverse fracture at the metaphyseal diaphyseal junction of the fifth metatarsal base consistent with a Jones fracture.  Increased fracture gap approximately 2 mm.  Assessment: 1.  Metatarsal Jones fracture LT foot; closed, mildly displaced, subsequent encounter   Plan of Care:  1. Patient evaluated. X-Rays reviewed demonstrating the Jones fracture with increased fracture gap.  2.  Patient states that the redness to the outside of the foot has improved.  Overall it is beginning to feel better. 3.  Continue WBAT in cam boot 4.  ORIF surgery of the fifth metatarsal Jones fracture was again discussed in detail with the patient including the postoperative recovery course.  She understands that she will be nonweightbearing for 4-6 weeks postoperatively.  Risk benefits advantages and disadvantages were also explained.  No guarantees were expressed or implied.  All patient questions answered. 5.  Return to clinic 1 week postop   Edrick Kins, DPM [facility]  Dr. Edrick Kins, DPM    [address]  [place], [place] [address]                Office [phone]  Fax [phone]   

## 2022-02-23 ENCOUNTER — Ambulatory Visit
Admission: RE | Admit: 2022-02-23 | Discharge: 2022-02-23 | Disposition: A | Discharge status: Home / Self Care | Payer: Managed Care, Other (non HMO) | Source: Ambulatory Visit | Attending: Primary Care | Admitting: Primary Care

## 2022-02-23 DIAGNOSIS — N63 Unspecified lump in unspecified breast: Secondary | ICD-10-CM

## 2022-02-23 DIAGNOSIS — R928 Other abnormal and inconclusive findings on diagnostic imaging of breast: Secondary | ICD-10-CM | POA: Diagnosis present

## 2022-03-03 ENCOUNTER — Other Ambulatory Visit: Payer: Self-pay | Admitting: Podiatry

## 2022-03-03 DIAGNOSIS — S92002A Unspecified fracture of left calcaneus, initial encounter for closed fracture: Secondary | ICD-10-CM | POA: Diagnosis not present

## 2022-03-03 HISTORY — PX: OTHER SURGICAL HISTORY: SHX169

## 2022-03-03 MED ORDER — OXYCODONE-ACETAMINOPHEN 5-325 MG PO TABS: 1.0000 | ORAL_TABLET | ORAL | 0 refills | Status: DC | PRN

## 2022-03-03 NOTE — Progress Notes (Signed)
PRN psotop

## 2022-03-04 ENCOUNTER — Other Ambulatory Visit: Payer: Self-pay

## 2022-03-04 MED ORDER — PROPAFENONE HCL ER 225 MG PO CP12: 225.0000 mg | ORAL_CAPSULE | Freq: Two times a day (BID) | ORAL | 2 refills | Status: DC

## 2022-03-08 ENCOUNTER — Other Ambulatory Visit: Payer: Self-pay | Admitting: Primary Care

## 2022-03-08 DIAGNOSIS — E039 Hypothyroidism, unspecified: Secondary | ICD-10-CM

## 2022-03-09 ENCOUNTER — Ambulatory Visit (INDEPENDENT_AMBULATORY_CARE_PROVIDER_SITE_OTHER): Payer: Managed Care, Other (non HMO) | Admitting: Podiatry

## 2022-03-09 ENCOUNTER — Ambulatory Visit: Payer: Managed Care, Other (non HMO)

## 2022-03-09 DIAGNOSIS — Z9889 Other specified postprocedural states: Secondary | ICD-10-CM | POA: Diagnosis not present

## 2022-03-09 MED ORDER — OXYCODONE-ACETAMINOPHEN 5-325 MG PO TABS: 1.0000 | ORAL_TABLET | ORAL | 0 refills | Status: DC | PRN

## 2022-03-09 MED ORDER — DOXYCYCLINE HYCLATE 100 MG PO TABS: 100.0000 mg | ORAL_TABLET | Freq: Two times a day (BID) | ORAL | 0 refills | Status: DC

## 2022-03-09 NOTE — Progress Notes (Signed)
   Chief Complaint  Patient presents with   Post-op Follow-up    POV#1 DOS 03/03/2022 ORIF 5TH RT    Subjective:  Patient presents today status post ORIF left fifth metatarsal 02/23/2022.  Patient doing well.  Today earlier she did bump her foot which caused increased pain and tenderness to the area.  Overall she is doing well with no complaints at this time.  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  On Eliquis; b. CHADS2VASc => 3 (HTN, DM, female)   Sleep apnea     Past Surgical History:  Procedure Laterality Date   ABDOMINAL HYSTERECTOMY  2002   ovaries remain   CLAVICLE SURGERY  2007   right clavicle plate pins   COLONOSCOPY WITH PROPOFOL N/A 10/17/2017   Procedure: COLONOSCOPY WITH PROPOFOL;  Surgeon: Jonathon Bellows, MD;  Location: [facility] ENDOSCOPY;  Service: Gastroenterology;  Laterality: N/A;   ESOPHAGOGASTRODUODENOSCOPY (EGD) WITH PROPOFOL N/A 10/17/2017   Procedure: ESOPHAGOGASTRODUODENOSCOPY (EGD) WITH PROPOFOL;  Surgeon: Jonathon Bellows, MD;  Location: [facility] ENDOSCOPY;  Service: Gastroenterology;  Laterality: N/A;   GIVENS CAPSULE STUDY N/A 11/30/2017   Procedure: GIVENS CAPSULE STUDY;  Surgeon: Jonathon Bellows, MD;  Location: [facility] ENDOSCOPY;  Service: Gastroenterology;  Laterality: N/A;   KNEE ARTHROSCOPY  2012    No Known Allergies  Objective/Physical Exam Neurovascular status intact.  Incision well coapted with sutures intact.  There is some heavy edema noted to the surgical extremity with some mild erythema.    Radiographic Exam LT foot  03/09/2022:  Orthopedic screw noted within the fifth metatarsal in good alignment.  Good apposition of the fracture fragment  Assessment: 1. s/p ORIF left fifth metatarsal. DOS: 03/03/2022 -Patient evaluated.  X-rays reviewed -New dressings applied -Continue strict NWB cam boot.  Larger cam boot dispensed today because she felt that the smaller cam boot was irritating her foot -Prescription for doxycycline 100 mg 2 times daily #20.  Although most of the erythema and edema around the foot appears postsurgical, cannot rule out low-grade postoperative cellulitis -Return to clinic 1 week  Edrick Kins, DPM [facility]  Dr. Edrick Kins, DPM    [address], [place] [address]                Office [phone]  Fax [phone]

## 2022-03-14 ENCOUNTER — Telehealth: Payer: Self-pay

## 2022-03-14 DIAGNOSIS — E039 Hypothyroidism, unspecified: Secondary | ICD-10-CM

## 2022-03-14 MED ORDER — LEVOTHYROXINE SODIUM 88 MCG PO TABS: ORAL_TABLET | ORAL | 2 refills | Status: DC

## 2022-03-14 NOTE — Addendum Note (Signed)
Addended by: Pleas [name] on: 03/14/2022 06:11 PM   Modules accepted: Orders

## 2022-03-14 NOTE — Telephone Encounter (Signed)
Noted, Rx sent to [facility] for generic Synthroid.

## 2022-03-14 NOTE — Telephone Encounter (Signed)
Received a call from [facility] they are requesting the patients prescription for Synthroid be changed to read the generic, and they will still dispense the brand name as this is all they have. Sending it in as the generic will save the patient hundreds of dollars on picking this up.   Called the patient and verified she is switching pharmacies and she verified she is switching from [facility] to [facility] for all her prescriptions. She has one week left on her synthroid and then will be out.

## 2022-03-15 ENCOUNTER — Ambulatory Visit (INDEPENDENT_AMBULATORY_CARE_PROVIDER_SITE_OTHER): Payer: Managed Care, Other (non HMO) | Admitting: Podiatry

## 2022-03-15 ENCOUNTER — Encounter: Payer: Managed Care, Other (non HMO) | Admitting: Podiatry

## 2022-03-15 ENCOUNTER — Ambulatory Visit: Payer: Managed Care, Other (non HMO) | Admitting: Primary Care

## 2022-03-15 VITALS — BP 160/81 | HR 81

## 2022-03-15 DIAGNOSIS — Z9889 Other specified postprocedural states: Secondary | ICD-10-CM

## 2022-03-15 NOTE — Progress Notes (Signed)
   Chief Complaint  Patient presents with   Routine Post Op    POV#2 DOS 03/03/2022 ORIF 5TH RT, patient is having pain, rate of pain 5 out of 10, patient is having drainage and bleed from site, TX: Cam Boot, pain meds     Subjective:  Patient presents today status post ORIF left fifth metatarsal 02/23/2022.  Patient continues to do well.  Pain is tolerable.  She is strict nonweightbearing in the cam boot.  No new complaints at this time Past Medical History:  Diagnosis Date   Carotid artery occlusion    20 % left side   Diabetes mellitus without complication (HCC)    Diverticulosis    GERD (gastroesophageal reflux disease)    History of colonic polyps    benign   Hyperlipidemia    Hypertension    Hypothyroidism    Lung infection    Mitral valve prolapse    NSVT (nonsustained ventricular tachycardia) (HCC)    Obstructive sleep apnea    on CPAP   PAF (paroxysmal atrial fibrillation) ([place])    a.  On Eliquis; b. CHADS2VASc => 3 (HTN, DM, female)   Sleep apnea     Past Surgical History:  Procedure Laterality Date   ABDOMINAL HYSTERECTOMY  2002   ovaries remain   CLAVICLE SURGERY  2007   right clavicle plate pins   COLONOSCOPY WITH PROPOFOL N/A 10/17/2017   Procedure: COLONOSCOPY WITH PROPOFOL;  Surgeon: Jonathon Bellows, MD;  Location: [facility] ENDOSCOPY;  Service: Gastroenterology;  Laterality: N/A;   ESOPHAGOGASTRODUODENOSCOPY (EGD) WITH PROPOFOL N/A 10/17/2017   Procedure: ESOPHAGOGASTRODUODENOSCOPY (EGD) WITH PROPOFOL;  Surgeon: Jonathon Bellows, MD;  Location: [facility] ENDOSCOPY;  Service: Gastroenterology;  Laterality: N/A;   GIVENS CAPSULE STUDY N/A 11/30/2017   Procedure: GIVENS CAPSULE STUDY;  Surgeon: Jonathon Bellows, MD;  Location: [facility] ENDOSCOPY;  Service: Gastroenterology;  Laterality: N/A;   KNEE ARTHROSCOPY  2012    No Known Allergies  Objective/Physical Exam Neurovascular status intact.  Incision well coapted sutures intact.  There is some questionable area of tissue necrosis that  is very superficial along the incision site.  Concern for possible dehiscence of some of the areas of the incision site which will need wound care if these areas do not heal  Radiographic Exam LT foot 03/09/2022:  Orthopedic screw noted within the fifth metatarsal in good alignment.  Good apposition of the fracture fragment  Assessment: 1. s/p ORIF left fifth metatarsal. DOS: 03/03/2022 -Patient evaluated.   -Dressings changed today -Continue oral antibiotics as prescribed until completed -Overall the incision site appears stable with no indication of erythema or edema around the area.  There is some small focal areas along the incision site of tissue necrosis which will likely have some dehiscence and wound healing but overall stable -Continue strict NWB cam boot -Return to clinic 1 week  Edrick Kins, DPM [facility]  Dr. Edrick Kins, DPM    [address], [place] [address]                Office [phone]  Fax [phone]

## 2022-03-16 ENCOUNTER — Encounter: Payer: Managed Care, Other (non HMO) | Admitting: Podiatry

## 2022-03-23 ENCOUNTER — Other Ambulatory Visit: Payer: Self-pay | Admitting: Cardiovascular Disease

## 2022-03-23 ENCOUNTER — Encounter: Payer: Managed Care, Other (non HMO) | Admitting: Podiatry

## 2022-03-23 MED ORDER — METOPROLOL SUCCINATE ER 25 MG PO TB24: 25.0000 mg | ORAL_TABLET | Freq: Two times a day (BID) | ORAL | 2 refills | Status: DC

## 2022-03-24 ENCOUNTER — Ambulatory Visit (INDEPENDENT_AMBULATORY_CARE_PROVIDER_SITE_OTHER): Payer: Managed Care, Other (non HMO) | Admitting: Podiatry

## 2022-03-24 ENCOUNTER — Other Ambulatory Visit: Payer: Self-pay

## 2022-03-24 DIAGNOSIS — Z9889 Other specified postprocedural states: Secondary | ICD-10-CM

## 2022-03-24 MED ORDER — SANTYL 250 UNIT/GM EX OINT: 1.0000 | TOPICAL_OINTMENT | Freq: Every day | CUTANEOUS | 0 refills | Status: DC

## 2022-03-24 MED ORDER — METOPROLOL SUCCINATE ER 25 MG PO TB24: 25.0000 mg | ORAL_TABLET | Freq: Two times a day (BID) | ORAL | 2 refills | Status: DC

## 2022-03-24 NOTE — Progress Notes (Signed)
   Chief Complaint  Patient presents with   Routine Post Op    POV#2 DOS 03/03/2022 ORIF 5TH RT, patient is having pain, rate of pain 5 out of 10, patient is having drainage and bleed from site, TX: Cam Boot, pain meds     Subjective:  Patient presents today status post ORIF left fifth metatarsal 02/23/2022.  Patient continues to do well.  Pain is tolerable.  She is strict nonweightbearing in the cam boot.  No new complaints at this time Past Medical History:  Diagnosis Date   Carotid artery occlusion    20 % left side   Diabetes mellitus without complication (HCC)    Diverticulosis    GERD (gastroesophageal reflux disease)    History of colonic polyps    benign   Hyperlipidemia    Hypertension    Hypothyroidism    Lung infection    Mitral valve prolapse    NSVT (nonsustained ventricular tachycardia) (HCC)    Obstructive sleep apnea    on CPAP   PAF (paroxysmal atrial fibrillation) ([place])    a.  On Eliquis; b. CHADS2VASc => 3 (HTN, DM, female)   Sleep apnea     Past Surgical History:  Procedure Laterality Date   ABDOMINAL HYSTERECTOMY  2002   ovaries remain   CLAVICLE SURGERY  2007   right clavicle plate pins   COLONOSCOPY WITH PROPOFOL N/A 10/17/2017   Procedure: COLONOSCOPY WITH PROPOFOL;  Surgeon: Jonathon Bellows, MD;  Location: [facility] ENDOSCOPY;  Service: Gastroenterology;  Laterality: N/A;   ESOPHAGOGASTRODUODENOSCOPY (EGD) WITH PROPOFOL N/A 10/17/2017   Procedure: ESOPHAGOGASTRODUODENOSCOPY (EGD) WITH PROPOFOL;  Surgeon: Jonathon Bellows, MD;  Location: [facility] ENDOSCOPY;  Service: Gastroenterology;  Laterality: N/A;   GIVENS CAPSULE STUDY N/A 11/30/2017   Procedure: GIVENS CAPSULE STUDY;  Surgeon: Jonathon Bellows, MD;  Location: [facility] ENDOSCOPY;  Service: Gastroenterology;  Laterality: N/A;   KNEE ARTHROSCOPY  2012    No Known Allergies  Objective/Physical Exam Neurovascular status intact.  Incision well coapted sutures intact.  There is stable fibrogranular wound noted near the  incision site.  Patient states that she is taking she is taking antibiotics.  She is known to Dr. Amalia Hailey.  Radiographic Exam LT foot 03/09/2022:  Orthopedic screw noted within the fifth metatarsal in good alignment.  Good apposition of the fracture fragment  Assessment: 1. s/p ORIF left fifth metatarsal. DOS: 03/03/2022 -Patient evaluated.   -Dressings changed today -Continue oral antibiotics as prescribed until completed - Stable fibrogranular wound noted part of the incision site.  Patient will benefit from Santyl wet-to-dry center was sent to the pharmacy. -Continue strict NWB cam boot -Return to clinic 1 week to see Dr. Amalia Hailey next week

## 2022-03-30 ENCOUNTER — Encounter: Payer: Managed Care, Other (non HMO) | Admitting: Podiatry

## 2022-03-30 ENCOUNTER — Ambulatory Visit: Payer: Managed Care, Other (non HMO)

## 2022-03-30 ENCOUNTER — Ambulatory Visit (INDEPENDENT_AMBULATORY_CARE_PROVIDER_SITE_OTHER): Payer: Managed Care, Other (non HMO) | Admitting: Podiatry

## 2022-03-30 ENCOUNTER — Ambulatory Visit (INDEPENDENT_AMBULATORY_CARE_PROVIDER_SITE_OTHER): Payer: Managed Care, Other (non HMO)

## 2022-03-30 DIAGNOSIS — Z9889 Other specified postprocedural states: Secondary | ICD-10-CM

## 2022-03-30 DIAGNOSIS — S92002A Unspecified fracture of left calcaneus, initial encounter for closed fracture: Secondary | ICD-10-CM

## 2022-04-01 ENCOUNTER — Encounter: Payer: Managed Care, Other (non HMO) | Admitting: Podiatry

## 2022-04-11 NOTE — Progress Notes (Signed)
   Chief Complaint  Patient presents with   Routine Post Op    Subjective:  Patient presents today status post ORIF left fifth metatarsal 02/23/2022.  Patient continues to do well.  Pain is tolerable.  She is strict nonweightbearing in the cam boot.  No new complaints at this time  Past Medical History:  Diagnosis Date   Carotid artery occlusion    20 % left side   Diabetes mellitus without complication (HCC)    Diverticulosis    GERD (gastroesophageal reflux disease)    History of colonic polyps    benign   Hyperlipidemia    Hypertension    Hypothyroidism    Lung infection    Mitral valve prolapse    NSVT (nonsustained ventricular tachycardia) (HCC)    Obstructive sleep apnea    on CPAP   PAF (paroxysmal atrial fibrillation) ([place])    a.  On Eliquis; b. CHADS2VASc => 3 (HTN, DM, female)   Sleep apnea     Past Surgical History:  Procedure Laterality Date   ABDOMINAL HYSTERECTOMY  2002   ovaries remain   CLAVICLE SURGERY  2007   right clavicle plate pins   COLONOSCOPY WITH PROPOFOL N/A 10/17/2017   Procedure: COLONOSCOPY WITH PROPOFOL;  Surgeon: Jonathon Bellows, MD;  Location: [facility] ENDOSCOPY;  Service: Gastroenterology;  Laterality: N/A;   ESOPHAGOGASTRODUODENOSCOPY (EGD) WITH PROPOFOL N/A 10/17/2017   Procedure: ESOPHAGOGASTRODUODENOSCOPY (EGD) WITH PROPOFOL;  Surgeon: Jonathon Bellows, MD;  Location: [facility] ENDOSCOPY;  Service: Gastroenterology;  Laterality: N/A;   GIVENS CAPSULE STUDY N/A 11/30/2017   Procedure: GIVENS CAPSULE STUDY;  Surgeon: Jonathon Bellows, MD;  Location: [facility] ENDOSCOPY;  Service: Gastroenterology;  Laterality: N/A;   KNEE ARTHROSCOPY  2012    No Known Allergies  Objective/Physical Exam Neurovascular status intact.  Incision well coapted sutures intact.  Overall the incision site appears to be healing significantly.  Minimal edema.  No erythema noted.  Radiographic Exam LT foot 03/30/2022:  Mostly unchanged since prior x-rays taken 03/09/2022.  Orthopedic screw  noted within the fifth metatarsal in good alignment.  Good apposition of the fracture fragment  Assessment: 1. s/p ORIF left fifth metatarsal. DOS: 03/03/2022 -Patient evaluated.   - Sutures removed. -Continue strict NWB cam boot -Return to clinic 4 weeks follow-up x-ray  Edrick Kins, DPM [facility]  Dr. Edrick Kins, DPM    [address], [place] [address]                Office [phone]  Fax [phone]

## 2022-04-13 ENCOUNTER — Encounter: Payer: Managed Care, Other (non HMO) | Admitting: Podiatry

## 2022-04-21 ENCOUNTER — Other Ambulatory Visit: Payer: Self-pay

## 2022-04-21 MED ORDER — HYDROCHLOROTHIAZIDE 25 MG PO TABS: 25.0000 mg | ORAL_TABLET | Freq: Every day | ORAL | 3 refills | Status: DC

## 2022-04-21 MED ORDER — LISINOPRIL 20 MG PO TABS: 20.0000 mg | ORAL_TABLET | Freq: Every day | ORAL | 3 refills | Status: DC

## 2022-04-22 ENCOUNTER — Telehealth: Payer: Self-pay

## 2022-04-22 DIAGNOSIS — K219 Gastro-esophageal reflux disease without esophagitis: Secondary | ICD-10-CM

## 2022-04-22 MED ORDER — FAMOTIDINE 20 MG PO TABS: 20.0000 mg | ORAL_TABLET | Freq: Two times a day (BID) | ORAL | 1 refills | Status: AC

## 2022-04-22 NOTE — Telephone Encounter (Signed)
Received refill request for:   Famotidine 60m   [facility] to [facility]

## 2022-04-26 ENCOUNTER — Telehealth: Payer: Self-pay

## 2022-04-26 DIAGNOSIS — E1159 Type 2 diabetes mellitus with other circulatory complications: Secondary | ICD-10-CM

## 2022-04-26 DIAGNOSIS — E039 Hypothyroidism, unspecified: Secondary | ICD-10-CM

## 2022-04-26 MED ORDER — JANUMET 50-1000 MG PO TABS: ORAL_TABLET | ORAL | 0 refills | Status: DC

## 2022-04-26 MED ORDER — FREESTYLE LIBRE 3 SENSOR MISC: 1 refills | Status: DC

## 2022-04-26 MED ORDER — SYNTHROID 88 MCG PO TABS: ORAL_TABLET | ORAL | 1 refills | Status: DC

## 2022-04-26 NOTE — Telephone Encounter (Signed)
Received refill request for

## 2022-04-26 NOTE — Addendum Note (Signed)
Addended by: Pleas [name] on: 04/26/2022 04:04 PM   Modules accepted: Orders

## 2022-04-26 NOTE — Telephone Encounter (Signed)
Refills sent to pharmacy. 

## 2022-04-28 ENCOUNTER — Encounter: Payer: Self-pay | Admitting: Oncology

## 2022-04-28 ENCOUNTER — Other Ambulatory Visit (HOSPITAL_COMMUNITY): Payer: Self-pay

## 2022-04-28 NOTE — Telephone Encounter (Signed)
Per test claim, insurance filled recently via mail order, just to double check, I ran Synthroid 34mg brand name through and received a paid claim. No PA submitted at this time, please advise.

## 2022-05-03 ENCOUNTER — Encounter: Payer: Self-pay | Admitting: Podiatry

## 2022-05-03 ENCOUNTER — Ambulatory Visit (INDEPENDENT_AMBULATORY_CARE_PROVIDER_SITE_OTHER): Payer: Managed Care, Other (non HMO)

## 2022-05-03 ENCOUNTER — Ambulatory Visit (INDEPENDENT_AMBULATORY_CARE_PROVIDER_SITE_OTHER): Payer: Managed Care, Other (non HMO) | Admitting: Podiatry

## 2022-05-03 DIAGNOSIS — Z9889 Other specified postprocedural states: Secondary | ICD-10-CM | POA: Diagnosis not present

## 2022-05-03 DIAGNOSIS — L97522 Non-pressure chronic ulcer of other part of left foot with fat layer exposed: Secondary | ICD-10-CM

## 2022-05-03 NOTE — Progress Notes (Signed)
   Chief Complaint  Patient presents with   Routine Post Op    "It's doing okay.  The incision is not healing as fast as I would like it to."    Subjective:  Patient presents today status post ORIF left fifth metatarsal 02/23/2022.  Patient is doing well.  She is nonweightbearing in the cam boot using the walker.  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  On Eliquis; b. CHADS2VASc => 3 (HTN, DM, female)   Sleep apnea     Past Surgical History:  Procedure Laterality Date   ABDOMINAL HYSTERECTOMY  2002   ovaries remain   CLAVICLE SURGERY  2007   right clavicle plate pins   COLONOSCOPY WITH PROPOFOL N/A 10/17/2017   Procedure: COLONOSCOPY WITH PROPOFOL;  Surgeon: Jonathon Bellows, MD;  Location: [facility] ENDOSCOPY;  Service: Gastroenterology;  Laterality: N/A;   ESOPHAGOGASTRODUODENOSCOPY (EGD) WITH PROPOFOL N/A 10/17/2017   Procedure: ESOPHAGOGASTRODUODENOSCOPY (EGD) WITH PROPOFOL;  Surgeon: Jonathon Bellows, MD;  Location: [facility] ENDOSCOPY;  Service: Gastroenterology;  Laterality: N/A;   GIVENS CAPSULE STUDY N/A 11/30/2017   Procedure: GIVENS CAPSULE STUDY;  Surgeon: Jonathon Bellows, MD;  Location: [facility] ENDOSCOPY;  Service: Gastroenterology;  Laterality: N/A;   KNEE ARTHROSCOPY  2012    No Known Allergies   LT foot 05/03/2022  Objective/Physical Exam Neurovascular status intact.  The incision has healed with the exception of the most distal portion which does have an area of dehiscence and ulcer development.  It appears very stable.  Please  see overlying photo.  There is a well adhered eschar.  Radiographic Exam LT foot 05/03/2022:  Fracture site stable.  Orthopedic screw noted within the fifth metatarsal in good alignment.  Good apposition of the fracture fragment  Assessment: 1. s/p ORIF left fifth metatarsal. DOS: 03/03/2022 -Patient evaluated.   - Excisional debridement of the eschar was performed today to the area of dehiscence.  No purulence.  No malodor.  Clinically or radiographically there is no concern for infection.  Prisma collagen dressing was applied with overlying Band-Aid.  Supplies were provided to apply Prisma daily -Patient may begin WBAT in the cam boot. -Due to the nonhealing nature of the wound I did order ABIs left lower extremity to establish a vascular baseline.  DP pulses are palpable however the PT pulses somewhat faint.   -Return to clinic 4 weeks  Edrick Kins, DPM [facility]  Dr. Edrick Kins, DPM    [address], [place] [address]                Office [phone]  Fax [phone]

## 2022-05-05 ENCOUNTER — Other Ambulatory Visit (HOSPITAL_COMMUNITY): Payer: Self-pay | Admitting: Podiatry

## 2022-05-05 ENCOUNTER — Telehealth: Payer: Self-pay | Admitting: *Deleted

## 2022-05-05 DIAGNOSIS — I739 Peripheral vascular disease, unspecified: Secondary | ICD-10-CM

## 2022-05-05 NOTE — Telephone Encounter (Signed)
error 

## 2022-05-10 ENCOUNTER — Ambulatory Visit (INDEPENDENT_AMBULATORY_CARE_PROVIDER_SITE_OTHER): Payer: Managed Care, Other (non HMO) | Admitting: Primary Care

## 2022-05-10 ENCOUNTER — Encounter: Payer: Self-pay | Admitting: Primary Care

## 2022-05-10 VITALS — BP 134/84 | HR 62 | Temp 98.2°F | Ht 66.0 in | Wt 179.0 lb

## 2022-05-10 DIAGNOSIS — E1159 Type 2 diabetes mellitus with other circulatory complications: Secondary | ICD-10-CM | POA: Diagnosis not present

## 2022-05-10 LAB — POCT GLYCOSYLATED HEMOGLOBIN (HGB A1C): Hemoglobin A1C: 6.9 % — AB (ref 4.0–5.6)

## 2022-05-10 NOTE — Assessment & Plan Note (Signed)
Controlled and improved with A1C today of 6.9!  Continue Sitagliptin-metformin 50-1000 mg twice daily, glipizide XL 10 mg daily.  Discussed to work on activity level and diet.  Follow-up in 6 months.

## 2022-05-10 NOTE — Progress Notes (Signed)
Subjective:    Patient ID: Debra Perez, female    DOB: Apr 03, 1955, 67 y.o.   MRN: [id number]  HPI  Debra Perez is a very pleasant 67 y.o. female with a history of type 2 diabetes, CAD, hypothyroidism, hypertension, GERD, iron deficiency anemia who presents today for follow up of diabetes.  Current medications include: Glipizide XL 10 mg daily, Sitagliptin-metformin 50-1000 mg BID  She is checking her blood glucose continuously and is getting readings of:  AM fasting: mid 100's Bedtime: 120's  She is within goal glucose range most of the time. She's noticed infrequent drops into the 50's during the night.   Last A1C: 7.3 in September 2023, 6.9 today Last Eye Exam: UTD Last Foot Exam: UTD Pneumonia Vaccination: 2021 Urine Microalbumin: UTD Statin: Rosuvastatin  Dietary changes since last visit: None.    Exercise: None due to recent foot surgery      Review of Systems  Respiratory:  Negative for shortness of breath.   Cardiovascular:  Negative for chest pain.  Neurological:  Negative for dizziness and numbness.         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  On Eliquis; b. CHADS2VASc => 3 (HTN, DM, female)   Sleep apnea     Social History   Socioeconomic History   Marital status: Married    Spouse name: Not on file   Number of children: Not on file   Years of education: Not on file   Highest education level: Not on file  Occupational History   Occupation: retired  Tobacco Use   Smoking status: Former    Packs/day:  1.00    Years: 20.00    Total pack years: 20.00    Types: Cigarettes    Quit date: 03/07/2004    Years since quitting: 18.1   Smokeless tobacco: Never  Vaping Use   Vaping Use: Never used  Substance and Sexual Activity   Alcohol use: No    Alcohol/week: 0.0 standard drinks of alcohol   Drug use: No   Sexual activity: Not on file  Other Topics Concern   Not on file  Social History Narrative   Married.   1 daughter.   Moved here from [place] due to husbands occupation.   She is a Corporate treasurer.   Enjoys sewing, gardening.   Social Determinants of Health   Financial Resource Strain: Not on file  Food Insecurity: Not on file  Transportation Needs: Not on file  Physical Activity: Not on file  Stress: Not on file  Social Connections: Not on file  Intimate Partner Violence: Not on file    Past Surgical History:  Procedure Laterality Date   ABDOMINAL HYSTERECTOMY  03/07/2000   ovaries remain   CLAVICLE SURGERY  03/07/2005   right clavicle plate pins   COLONOSCOPY WITH PROPOFOL N/A 10/17/2017   Procedure: COLONOSCOPY WITH PROPOFOL;  Surgeon: Jonathon Bellows, MD;  Location: [facility] ENDOSCOPY;  Service: Gastroenterology;  Laterality: N/A;  ESOPHAGOGASTRODUODENOSCOPY (EGD) WITH PROPOFOL N/A 10/17/2017   Procedure: ESOPHAGOGASTRODUODENOSCOPY (EGD) WITH PROPOFOL;  Surgeon: Jonathon Bellows, MD;  Location: [facility] ENDOSCOPY;  Service: Gastroenterology;  Laterality: N/A;   GIVENS CAPSULE STUDY N/A 11/30/2017   Procedure: GIVENS CAPSULE STUDY;  Surgeon: Jonathon Bellows, MD;  Location: [facility] ENDOSCOPY;  Service: Gastroenterology;  Laterality: N/A;   KNEE ARTHROSCOPY  03/07/2010   Left foot surgery Left 03/03/2022    Family History  Problem Relation Age of Onset   Depression Mother    Sudden death Mother 66   Heart attack Mother    Arthritis Father    Heart disease Father    Hypertension Father    Emphysema Father    Bladder Cancer Sister    Diabetes Sister        Type 2   Diabetes Brother        type    Hypertension Brother    Emphysema Brother    Heart disease Brother    Diabetes Maternal Grandfather    Bladder Cancer Maternal Grandmother        sinus cancer    Breast cancer Paternal Grandmother    Cancer Paternal Grandfather        colon cancer   Diabetes Paternal Grandfather     No Known Allergies  Current Outpatient Medications on File Prior to Visit  Medication Sig Dispense Refill   Accu-Chek FastClix Lancets MISC USE TO TEST BLOOD SUGAR TWICE DAILY AS DIRECTED 204 each 1   [name] 50 MG tablet Take 1 tablet (50 mg total) by mouth at bedtime. For back pain 90 tablet 2   b complex vitamins capsule Take 1 capsule by mouth 2 (two) times daily.     Continuous Blood Gluc Sensor (FREESTYLE LIBRE 3 SENSOR) MISC Place 1 sensor on the skin every 14 days. Use to check glucose continuously 6 each 1   ezetimibe (ZETIA) 10 MG tablet TAKE 1 TABLET BY MOUTH DAILY GENERIC EQUIVALENT FOR ZETIA 90 tablet 3   famotidine (PEPCID) 20 MG tablet Take 1 tablet (20 mg total) by mouth 2 (two) times daily. For heartburn. 180 tablet 1   glipiZIDE (GLUCOTROL XL) 10 MG 24 hr tablet TAKE 1 TABLET BY MOUTH DAILY WITH BREAKFAST FOR DIABETES 90 tablet 1   hydrochlorothiazide (HYDRODIURIL) 25 MG tablet Take 1 tablet (25 mg total) by mouth daily. 90 tablet 3   ipratropium (ATROVENT HFA) 17 MCG/ACT inhaler Inhale 2 puffs into the lungs every 4 (four) hours as needed for wheezing. 1 each 0   Iron-Vitamin C 65-125 MG TABS Take by mouth daily.      lisinopril (ZESTRIL) 20 MG tablet Take 1 tablet (20 mg total) by mouth daily. 90 tablet 3   Magnesium 500 MG CAPS Take 1 capsule by mouth 2 (two) times daily.     methocarbamol (ROBAXIN) 750 MG tablet Take 375-750 mg by mouth at bedtime as needed.     metoprolol succinate (TOPROL-XL) 25 MG 24 hr tablet Take 1 tablet (25 mg total) by mouth 2 (two) times daily. Take with or immediately following a meal. 180 tablet 2   Multiple Vitamins-Minerals (MULTIVITAMIN  ADULT PO) Take 1 capsule by mouth daily.      propafenone (RYTHMOL SR) 225 MG 12 hr capsule Take 1 capsule (225 mg total) by mouth 2 (two) times daily. 180 capsule 2   rosuvastatin (CRESTOR) 5 MG tablet TAKE 1 TABLET BY MOUTH DAILY GENERIC EQUIVALENT FOR CRESTOR 90 tablet 3   sitaGLIPtin-metformin (JANUMET) 50-1000 MG  tablet TAKE 1 TABLET BY MOUTH 2 TIMES DAILY WITH A MEAL FOR DIABETES 180 tablet 0   SYNTHROID 88 MCG tablet Take 1 tablet by mouth every morning on an empty stomach with water only.  No food or other medications for 30 minutes. 90 tablet 1   verapamil (CALAN-SR) 240 MG CR tablet Take 1 tablet (240 mg total) by mouth daily. 90 tablet 2   apixaban (ELIQUIS) 5 MG TABS tablet Take 5 mg by mouth 2 (two) times daily.     cephALEXin (KEFLEX) 500 MG capsule Take 1 capsule (500 mg total) by mouth 3 (three) times daily. 21 capsule 0   collagenase (SANTYL) 250 UNIT/GM ointment Apply 1 Application topically daily. 15 g 0   doxycycline (VIBRA-TABS) 100 MG tablet Take 1 tablet (100 mg total) by mouth 2 (two) times daily. 20 tablet 0   gentamicin ointment (GARAMYCIN) 0.1 % Apply 1 application topically daily as needed. For nasal sore. (Patient not taking: Reported on 05/10/2022) 15 g 0   oxyCODONE-acetaminophen (PERCOCET) 5-325 MG tablet Take 1 tablet by mouth every 4 (four) hours as needed for severe pain. 30 tablet 0   predniSONE (DELTASONE) 20 MG tablet Take 2 tablets by mouth once daily in the morning for 5 days. 10 tablet 0   promethazine-dextromethorphan (PROMETHAZINE-DM) 6.25-15 MG/5ML syrup Take 5 mLs by mouth at bedtime as needed for cough. 118 mL 0   Current Facility-Administered Medications on File Prior to Visit  Medication Dose Route Frequency Provider Last Rate Last Admin   heparin lock flush 100 unit/mL  500 Units Intracatheter Once PRN Earlie Server, MD       sodium chloride flush (NS) 0.9 % injection 10 mL  10 mL Intracatheter PRN Earlie Server, MD   10 mL at 10/06/17 1515    BP 134/84    Pulse 62   Temp 98.2 F (36.8 C) (Temporal)   Ht '5\' 6"'$  (1.676 m)   Wt 179 lb (81.2 kg)   SpO2 96%   BMI 28.89 kg/m  Objective:   Physical Exam Cardiovascular:     Rate and Rhythm: Normal rate and regular rhythm.  Pulmonary:     Effort: Pulmonary effort is normal.  Musculoskeletal:     Cervical back: Neck supple.  Skin:    General: Skin is warm and dry.           Assessment & Plan:  Type 2 diabetes mellitus with other circulatory complication, without long-term current use of insulin (HCC) Assessment & Plan: Controlled and improved with A1C today of 6.9!  Continue Sitagliptin-metformin 50-1000 mg twice daily, glipizide XL 10 mg daily.  Discussed to work on activity level and diet.  Follow-up in 6 months.  Orders: -     POCT glycosylated hemoglobin (Hb A1C)        Pleas [name], NP

## 2022-05-11 ENCOUNTER — Encounter: Payer: Self-pay | Admitting: Podiatry

## 2022-05-18 ENCOUNTER — Ambulatory Visit (HOSPITAL_COMMUNITY)
Admission: RE | Admit: 2022-05-18 | Discharge: 2022-05-18 | Disposition: A | Discharge status: Home / Self Care | Payer: Managed Care, Other (non HMO) | Source: Ambulatory Visit | Attending: Podiatry | Admitting: Podiatry

## 2022-05-18 DIAGNOSIS — L97522 Non-pressure chronic ulcer of other part of left foot with fat layer exposed: Secondary | ICD-10-CM | POA: Diagnosis not present

## 2022-05-18 DIAGNOSIS — I739 Peripheral vascular disease, unspecified: Secondary | ICD-10-CM | POA: Diagnosis not present

## 2022-05-18 LAB — VAS US ABI WITH/WO TBI

## 2022-06-07 ENCOUNTER — Encounter: Payer: Self-pay | Admitting: Podiatry

## 2022-06-07 ENCOUNTER — Ambulatory Visit (INDEPENDENT_AMBULATORY_CARE_PROVIDER_SITE_OTHER): Payer: Managed Care, Other (non HMO)

## 2022-06-07 ENCOUNTER — Ambulatory Visit (INDEPENDENT_AMBULATORY_CARE_PROVIDER_SITE_OTHER): Payer: Managed Care, Other (non HMO) | Admitting: Podiatry

## 2022-06-07 VITALS — BP 115/73 | HR 81

## 2022-06-07 DIAGNOSIS — Z9889 Other specified postprocedural states: Secondary | ICD-10-CM | POA: Diagnosis not present

## 2022-06-07 NOTE — Progress Notes (Signed)
   Chief Complaint  Patient presents with   Routine Post Op    "The wound is getting a lot smaller.  I've developed a knot on the bottom of my foot and it's tender to touch."    Subjective:  Patient presents today status post ORIF left fifth metatarsal 02/23/2022.  Patient continues to do well.  Minimal to no pain.  Currently she is WBAT in surgical shoe.  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  On Eliquis; b. CHADS2VASc => 3 (HTN, DM, female)   Sleep apnea     Past Surgical History:  Procedure Laterality Date   ABDOMINAL HYSTERECTOMY  03/07/2000   ovaries remain   CLAVICLE SURGERY  03/07/2005   right clavicle plate pins   COLONOSCOPY WITH PROPOFOL N/A 10/17/2017   Procedure: COLONOSCOPY WITH PROPOFOL;  Surgeon: Jonathon Bellows, MD;  Location: [facility] ENDOSCOPY;  Service: Gastroenterology;  Laterality: N/A;   ESOPHAGOGASTRODUODENOSCOPY (EGD) WITH PROPOFOL N/A 10/17/2017   Procedure: ESOPHAGOGASTRODUODENOSCOPY (EGD) WITH PROPOFOL;  Surgeon: Jonathon Bellows, MD;  Location: [facility] ENDOSCOPY;  Service: Gastroenterology;  Laterality: N/A;   GIVENS CAPSULE STUDY N/A 11/30/2017   Procedure: GIVENS CAPSULE STUDY;  Surgeon: Jonathon Bellows, MD;  Location: [facility] ENDOSCOPY;  Service: Gastroenterology;  Laterality: N/A;   KNEE ARTHROSCOPY  03/07/2010   Left foot surgery Left 03/03/2022    No Known Allergies   LT foot 05/03/2022   LT foot 06/07/2022  Objective/Physical Exam Neurovascular status intact.  The ulcer over the distal portion of the incision site is  healing nicely.  Significant improvement over the past 6 weeks  Radiographic Exam LT foot 05/03/2022:  Fracture site stable.  Orthopedic screw noted within the fifth metatarsal in good alignment.  Good apposition of the fracture fragment  Radiographic exam LT foot 06/07/2022: Good healing of the fracture site.  Overall there does appear to be good improvement with good potential for complete osseous union and healing.  Orthopedic screw continues to be stable and intact with good alignment  Assessment: 1. s/p ORIF left fifth metatarsal. DOS: 03/03/2022  -Patient evaluated.  ABIs and arterial studies reviewed.  For now there is improvement of the wound and flow appears to be adequate. -Light debridement performed today.  Continue Prisma collagen dressing daily -Patient may continue wearing good supportive tennis shoes and sneakers -Return to clinic 4 weeks.  No x-rays needed at this time  Edrick Kins, DPM [facility]  Dr. Edrick Kins, DPM    [address], [place] [address]                Office [phone]  Fax [phone]

## 2022-06-13 DIAGNOSIS — G8929 Other chronic pain: Secondary | ICD-10-CM

## 2022-06-14 MED ORDER — AMITRIPTYLINE HCL 50 MG PO TABS: 50.0000 mg | ORAL_TABLET | Freq: Every day | ORAL | 0 refills | Status: DC

## 2022-06-30 ENCOUNTER — Other Ambulatory Visit: Payer: Self-pay | Admitting: Cardiovascular Disease

## 2022-06-30 DIAGNOSIS — E782 Mixed hyperlipidemia: Secondary | ICD-10-CM

## 2022-07-01 ENCOUNTER — Other Ambulatory Visit: Payer: Self-pay | Admitting: Primary Care

## 2022-07-01 DIAGNOSIS — E1159 Type 2 diabetes mellitus with other circulatory complications: Secondary | ICD-10-CM

## 2022-07-07 ENCOUNTER — Other Ambulatory Visit: Payer: Self-pay | Admitting: Primary Care

## 2022-07-07 ENCOUNTER — Encounter: Payer: Self-pay | Admitting: Primary Care

## 2022-07-07 DIAGNOSIS — E1159 Type 2 diabetes mellitus with other circulatory complications: Secondary | ICD-10-CM

## 2022-07-15 ENCOUNTER — Ambulatory Visit (INDEPENDENT_AMBULATORY_CARE_PROVIDER_SITE_OTHER): Payer: Managed Care, Other (non HMO) | Admitting: Podiatry

## 2022-07-15 ENCOUNTER — Encounter: Payer: Self-pay | Admitting: Podiatry

## 2022-07-15 VITALS — BP 121/53 | HR 61

## 2022-07-15 DIAGNOSIS — L97522 Non-pressure chronic ulcer of other part of left foot with fat layer exposed: Secondary | ICD-10-CM | POA: Diagnosis not present

## 2022-07-15 NOTE — Progress Notes (Addendum)
Chief Complaint  Patient presents with   Routine Post Op    "It's finally closed.  Thank God."    Subjective:  Patient presents today status post ORIF left fifth metatarsal 02/23/2022.  Patient subsequently developed an ulcer to the incision site of the left foot.  The ulcer has now healed.  She is ambulating in tennis shoes.  Patient has no pain during ambulation.  Today she is wearing Birkenstock sandals.  She says she is doing great.  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  On Eliquis; b. CHADS2VASc => 3 (HTN, DM, female)   Sleep apnea     Past Surgical History:  Procedure Laterality Date   ABDOMINAL HYSTERECTOMY  03/07/2000   ovaries remain   CLAVICLE SURGERY  03/07/2005   right clavicle plate pins   COLONOSCOPY WITH PROPOFOL N/A 10/17/2017   Procedure: COLONOSCOPY WITH PROPOFOL;  Surgeon: [name], MD;  Location: [facility] ENDOSCOPY;  Service: Gastroenterology;  Laterality: N/A;   ESOPHAGOGASTRODUODENOSCOPY (EGD) WITH PROPOFOL N/A 10/17/2017   Procedure: ESOPHAGOGASTRODUODENOSCOPY (EGD) WITH PROPOFOL;  Surgeon: [name], MD;  Location: [facility] ENDOSCOPY;  Service: Gastroenterology;  Laterality: N/A;   GIVENS CAPSULE STUDY N/A 11/30/2017   Procedure: GIVENS CAPSULE STUDY;  Surgeon: [name], MD;  Location: [facility] ENDOSCOPY;  Service: Gastroenterology;  Laterality: N/A;   KNEE ARTHROSCOPY  03/07/2010   Left foot surgery Left 03/03/2022    No Known Allergies   LT foot 05/03/2022   LT foot 06/07/2022  Objective/Physical Exam Neurovascular  status intact.  The ulcer over the distal portion of the incision site has healed.  Complete reepithelialization has occurred.  No tenderness around the surgical area.  No edema.  Muscle strength 5/5 all compartments  VAS [place] ABI W/WO TBI 05/18/2022 ABI Findings:  +---------+------------------+-----+---------+--------+  Right   Rt Pressure (mmHg)IndexWaveform Comment   +---------+------------------+-----+---------+--------+  Brachial 128                                       +---------+------------------+-----+---------+--------+  PTA     179               1.34 biphasic           +---------+------------------+-----+---------+--------+  PERO    171               1.28 triphasic          +---------+------------------+-----+---------+--------+  DP      168               1.25 biphasic           +---------+------------------+-----+---------+--------+  Great Toe101               0.75 Normal             +---------+------------------+-----+---------+--------+   +---------+------------------+-----+----------+-------+  Left    Lt Pressure (mmHg)IndexWaveform  Comment  +---------+------------------+-----+----------+-------+  Brachial 134                                       +---------+------------------+-----+----------+-------+  PTA     144               1.07 monophasic         +---------+------------------+-----+----------+-------+  PERO    176               1.38 monophasic         +---------+------------------+-----+----------+-------+  DP      161               1.20 biphasic           +---------+------------------+-----+----------+-------+  Great Toe111               0.83 Normal             +---------+------------------+-----+----------+-------+   +-------+-----------+-----------+------------+------------+  ABI/TBIToday's ABIToday's TBIPrevious ABIPrevious TBI   +-------+-----------+-----------+------------+------------+  Right N/C        .75                                  +-------+-----------+-----------+------------+------------+  Left  N/C        .83                                  +-------+-----------+-----------+------------+------------+   TOES Findings:  +----------+---------------+--------+-------+  Right ToesPressure (mmHg)WaveformComment  +----------+---------------+--------+-------+  1st Digit                Normal           +----------+---------------+--------+-------+  2nd Digit                Abnormal         +----------+---------------+--------+-------+  3rd Digit                Abnormal         +----------+---------------+--------+-------+  4th Digit                Normal           +----------+---------------+--------+-------+  5th Digit                Normal           +----------+---------------+--------+-------+    +---------+---------------+--------+-------+  Left ToesPressure (mmHg)WaveformComment  +---------+---------------+--------+-------+  1st Digit               Normal           +---------+---------------+--------+-------+  2nd Digit               Abnormal         +---------+---------------+--------+-------+  3rd Digit               Abnormal         +---------+---------------+--------+-------+  4th Digit               Normal           +---------+---------------+--------+-------+  5th Digit               Normal           +---------+---------------+--------+-------+    Summary:  Right: Resting right ankle-brachial index indicates noncompressible right  lower extremity arteries.  The right toe-brachial index is normal.   Left: Resting left ankle-brachial index indicates noncompressible left  lower extremity arteries. The left toe-brachial index is normal.   Radiographic Exam LT foot 05/03/2022:  Fracture site stable.  Orthopedic screw  noted within the fifth metatarsal in good alignment.  Good apposition of the fracture fragment  Radiographic exam LT foot 06/07/2022: Good healing of the fracture site.  Overall there does appear to be good improvement with good potential for complete osseous union and healing.  Orthopedic screw continues to be stable and intact with good alignment  Assessment: 1. s/p ORIF left fifth metatarsal. DOS: 03/03/2022 2.  Surgical dehiscence left foot; resolved -Patient evaluated.  Patient is doing very well with no pain or tenderness during normal daily activity - Continue good supportive tennis shoes and sneakers.  Slowly increase activity -Patient may resume full activity no restrictions as tolerated -Return to clinic as needed  Felecia Shelling, DPM [facility]  Dr. Felecia Shelling, DPM    [address] [place], [place] [address]                Office [phone]  Fax [phone]

## 2022-07-15 NOTE — Progress Notes (Unsigned)
Cardiology Office Note    Date:  07/18/2022   ID:  [name], DOB 09/06/1955, MRN [id number]  PCP:  Doreene Nest, NP  Cardiologist:  Julien Nordmann, MD  Electrophysiologist:  None   Chief Complaint: Fluctuating heart rates  History of Present Illness:   Debra Perez is a 67 y.o. 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  Remote stress testing from 2005 in [place] showed no significant ischemia or prior MI, normal LV size and function with an EF of 80%, adequate exercise tolerance, good blood pressure and heart rate response to exercise.  Normal study.  Cardiac CT from 2016 showed an EF of 73%, ectatic ascending aorta measuring 3.72 cm in caliber, normal caliber of the descending segment, three-vessel CAD with mild multifocal calcific narrowing of the proximal LAD and proximal ramus intermedius.  Mild calcific narrowing in the proximal RCA with minimal narrowing in the middle third.  No critical or flow-limiting disease was identified.  Echo from 2016 was technically limited due to poor acoustic windows with an EF of 60 to 65%, mild concentric LVH, diastolic dysfunction, left atrium upper limits of normal, mild posterior MVP, trace mitral regurgitation, RVSP 27 mmHg, no pericardial effusion.  Patient has previously attributed her A. fib to a URI while on steroids and albuterol.  She has previously not tolerated scheduled beta-blockers secondary to fatigue and reported weight gain.  Multaq led to hair loss.  Echo in 03/2018 showed an EF of 60 to 65%, diastolic dysfunction, normal size left atrium, normal size right  atrium, no significant valvular abnormalities, aortic root 2.8 cm, ascending aorta 3.2 cm, aortic arch 2.8 cm.  Outpatient cardiac monitoring showed an overall sinus rhythm with an average heart rate of 71 bpm.  There were 6 SVT/atrial tachycardic runs with the fastest interval lasting 5 beats with a maximum rate of 167 bpm and the longest interval lasting 14 beats with an average rate of 127 bpm.  Isolated PACs, atrial couplets, and atrial triplets were rare.  Isolated PVCs were rare.  Patient triggered events were either not associated with significant arrhythmia or with clusters of PACs.  CTA aorta on 08/17/2018 that showed an essentially unchanged thoracic aorta measuring 3.8 x 3.6 cm with minimal aortic atherosclerosis and coronary artery calcification.  She was last seen in our office in 12/2021 with Apple Watch.  Demonstrated idioventricular rhythm converting to sinus rhythm.  No further A-fib documented.  No changes were undertaken.  She comes in accompanied by her husband today and notes an approximate 4-week history of heart rates ranging from the 30s to 140s bpm.  During these episodes she has noted increased fatigue, dizziness, and dyspnea at times.  No angina.  Episodes are randomly occurring and will range today were from several seconds to an episode of tachypalpitations that lasted all day long (she wonders if this occurred in the setting of missing a couple doses of metoprolol).  Strips provided by her watch show sinus bradycardia with an underlying bundle branch block with occasional, at times frequent PVCs in a pattern of bigeminy as well as intermittent idioventricular rhythm.  No falls or frank syncope.  Currently, she  takes oral XL 25 mg twice daily, propafenone 225 mg twice daily and verapamil to 40 mg daily.   Labs independently reviewed: 05/2022 - A1c 6.9 11/2021 - potassium 4.5, BUN 14, serum creatinine 0.94, TSH normal, albumin 4.3, AST/ALT normal, TC 160, TG 87, HDL 55, LDL 42 10/2020  - Hgb 13.3, PLT 261  Past Medical History:  Diagnosis Date   Carotid artery occlusion    20 % left side   Diabetes mellitus without complication (HCC)    Diverticulosis    GERD (gastroesophageal reflux disease)    History of colonic polyps    benign   Hyperlipidemia    Hypertension    Hypothyroidism    Lung infection    Mitral valve prolapse    NSVT (nonsustained ventricular tachycardia) (HCC)    Obstructive sleep apnea    on CPAP   PAF (paroxysmal atrial fibrillation) (HCC)    a.  On Eliquis; b. CHADS2VASc => 3 (HTN, DM, female)   Sleep apnea     Past Surgical History:  Procedure Laterality Date   ABDOMINAL HYSTERECTOMY  03/07/2000   ovaries remain   CLAVICLE SURGERY  03/07/2005   right clavicle plate pins   COLONOSCOPY WITH PROPOFOL N/A 10/17/2017   Procedure: COLONOSCOPY WITH PROPOFOL;  Surgeon: [name], MD;  Location: [facility] ENDOSCOPY;  Service: Gastroenterology;  Laterality: N/A;   ESOPHAGOGASTRODUODENOSCOPY (EGD) WITH PROPOFOL N/A 10/17/2017   Procedure: ESOPHAGOGASTRODUODENOSCOPY (EGD) WITH PROPOFOL;  Surgeon: [name], MD;  Location: [facility] ENDOSCOPY;  Service: Gastroenterology;  Laterality: N/A;   GIVENS CAPSULE STUDY N/A 11/30/2017   Procedure: GIVENS CAPSULE STUDY;  Surgeon: [name], MD;  Location: [facility] ENDOSCOPY;  Service: Gastroenterology;  Laterality: N/A;   KNEE ARTHROSCOPY  03/07/2010   Left foot surgery Left 03/03/2022    Current Medications: Current Meds  Medication Sig   Accu-Chek FastClix Lancets MISC USE TO TEST BLOOD SUGAR TWICE DAILY AS DIRECTED   [name] 50 MG tablet Take 1 tablet (50 mg total) by mouth at bedtime. For back pain   [name] 50 MG tablet Take 1 tablet (50 mg total) by mouth at bedtime.   b complex vitamins capsule Take 1 capsule by mouth 2 (two) times daily.   Continuous Blood Gluc Sensor (FREESTYLE LIBRE 3 SENSOR) MISC Place 1 sensor on the skin every 14 days. Use to check glucose continuously    ezetimibe (ZETIA) 10 MG tablet TAKE 1 TABLET BY MOUTH DAILY   famotidine (PEPCID) 20 MG tablet Take 1 tablet (20 mg total) by mouth 2 (two) times daily. For heartburn.   gentamicin ointment (GARAMYCIN) 0.1 % Apply 1 application topically daily as needed. For nasal sore.   glipiZIDE (GLUCOTROL XL) 10 MG 24 hr tablet TAKE 1 TABLET BY MOUTH DAILY WITH BREAKFAST FOR DIABETES   hydrochlorothiazide (HYDRODIURIL) 25 MG tablet Take 1 tablet (25 mg total) by mouth daily.   ipratropium (ATROVENT HFA) 17 MCG/ACT inhaler Inhale 2 puffs into the lungs every 4 (four) hours as needed for wheezing.   Iron-Vitamin C 65-125 MG TABS Take by mouth daily.    lisinopril (ZESTRIL) 20 MG tablet Take 1 tablet (20 mg total) by mouth daily.   Magnesium 500 MG CAPS Take 1 capsule by mouth 2 (two) times daily.   methocarbamol (ROBAXIN) 750 MG tablet Take 375-750 mg by mouth at bedtime as needed.   metoprolol succinate (TOPROL-XL) 25 MG 24 hr tablet Take 1 tablet (25 mg total) by mouth 2 (two) times daily. Take with or immediately  following a meal.   Multiple Vitamins-Minerals (MULTIVITAMIN ADULT PO) Take 1 capsule by mouth daily.    propafenone (RYTHMOL SR) 225 MG 12 hr capsule Take 1 capsule (225 mg total) by mouth 2 (two) times daily.   rosuvastatin (CRESTOR) 5 MG tablet TAKE 1 TABLET BY MOUTH DAILY GENERIC EQUIVALENT FOR CRESTOR   sitaGLIPtin-metformin (JANUMET) 50-1000 MG tablet TAKE 1 TABLET TWICE A DAY WITH MEALS FOR DIABETES   SYNTHROID 88 MCG tablet Take 1 tablet by mouth every morning on an empty stomach with water only.  No food or other medications for 30 minutes.   [DISCONTINUED] verapamil (CALAN-SR) 240 MG CR tablet Take 1 tablet (240 mg total) by mouth daily.    Allergies:   Patient has no known allergies.   Social History   Socioeconomic History   Marital status: Married    Spouse name: Not on file   Number of children: Not on file   Years of education: Not on file   Highest education level: Not on  file  Occupational History   Occupation: retired  Tobacco Use   Smoking status: Former    Packs/day: 1.00    Years: 20.00    Additional pack years: 0.00    Total pack years: 20.00    Types: Cigarettes    Quit date: 03/07/2004    Years since quitting: 18.3   Smokeless tobacco: Never  Vaping Use   Vaping Use: Never used  Substance and Sexual Activity   Alcohol use: No    Alcohol/week: 0.0 standard drinks of alcohol   Drug use: No   Sexual activity: Not on file  Other Topics Concern   Not on file  Social History Narrative   Married.   1 daughter.   Moved here from [place] due to husbands occupation.   She is a Public house manager.   Enjoys sewing, gardening.   Social Determinants of Health   Financial Resource Strain: Not on file  Food Insecurity: Not on file  Transportation Needs: Not on file  Physical Activity: Not on file  Stress: Not on file  Social Connections: Not on file     Family History:  The patient's family history includes Arthritis in her father; Bladder Cancer in her maternal grandmother and sister; Breast cancer in her paternal grandmother; Cancer in her paternal grandfather; Depression in her mother; Diabetes in her brother, maternal grandfather, paternal grandfather, and sister; Emphysema in her brother and father; Heart attack in her mother; Heart disease in her brother and father; Hypertension in her brother and father; Sudden death (age of onset: 58) in her mother.  ROS:   12-point review of systems is negative unless otherwise noted in the HPI.   EKGs/Labs/Other Studies Reviewed:    Studies reviewed were summarized above. The additional studies were reviewed today:  2D echo 04/06/2018: 1. The left ventricle has normal systolic function of 60-65%. The cavity  size is normal. There is no left ventricular wall thickness. Echo evidence  of impaired relaxation diastolic filling patterns.   2. Normal left atrial size.   3. Normal right atrial size.   4. Normal  tricuspid valve.   5. There is normal right ventricular systolic function. b Right  ventricular systolic pressure could not be assessed.  __________  Luci Bank patch 03/2018: Normal sinus rhythm Average Heart rate: 71 bpm.    6 Supraventricular Tachycardia/atrial tachyardia runs occurred, the run with the fastest interval lasting 5 beats with a max rate of 167 bpm,  the longest lasting  14 beats with an avg rate of 127 bpm.    Isolated SVEs were rare (<1.0%), SVE Couplets were rare (<1.0%), and SVE Triplets were rare (<1.0%).  Isolated VEs were rare (<1.0%), and no VE Couplets or VE Triplets were present.   Patient triggered events were either not associated with significant arrhythmia, or with clusters of APCs.   EKG:  EKG is ordered today.  The EKG ordered today demonstrates Sinus bradycardia, 55 bpm, RBBB, nonspecific inferior st/t changes, consistent with prior tracing  Recent Labs: 11/30/2021: ALT 26; TSH 0.96 12/03/2021: BUN 14; Creatinine, Ser 0.94; Potassium 4.5; Sodium 132  Recent Lipid Panel    Component Value Date/Time   CHOL 116 11/30/2021 0918   TRIG 87.0 11/30/2021 0918   HDL 55.90 11/30/2021 0918   CHOLHDL 2 11/30/2021 0918   VLDL 17.4 11/30/2021 0918   LDLCALC 42 11/30/2021 0918    PHYSICAL EXAM:    VS:  BP 112/80 (BP Location: Left Arm, Patient Position: Sitting, Cuff Size: Normal)   Pulse (!) 55   Ht 5\' 6"  (1.676 m)   Wt 181 lb 3.2 oz (82.2 kg)   SpO2 98%   BMI 29.25 kg/m   BMI: Body mass index is 29.25 kg/m.  Physical Exam Vitals reviewed.  Constitutional:      Appearance: She is well-developed.  HENT:     Head: Normocephalic and atraumatic.  Eyes:     General:        Right eye: No discharge.        Left eye: No discharge.  Neck:     Vascular: No JVD.  Cardiovascular:     Rate and Rhythm: Regular rhythm. Bradycardia present. Occasional Extrasystoles are present.    Pulses:          Posterior tibial pulses are 2+ on the right side and 2+ on the left  side.     Heart sounds: Normal heart sounds, S1 normal and S2 normal. Heart sounds not distant. No midsystolic click and no opening snap. No murmur heard.    No friction rub.  Pulmonary:     Effort: Pulmonary effort is normal. No respiratory distress.     Breath sounds: Normal breath sounds. No decreased breath sounds, wheezing or rales.  Chest:     Chest wall: No tenderness.  Abdominal:     General: There is no distension.  Musculoskeletal:     Cervical back: Normal range of motion.     Right lower leg: No edema.     Left lower leg: No edema.  Skin:    General: Skin is warm and dry.     Nails: There is no clubbing.  Neurological:     Mental Status: She is alert and oriented to person, place, and time.  Psychiatric:        Speech: Speech normal.        Behavior: Behavior normal.        Thought Content: Thought content normal.        Judgment: Judgment normal.     Wt Readings from Last 3 Encounters:  07/18/22 181 lb 3.2 oz (82.2 kg)  05/10/22 179 lb (81.2 kg)  02/02/22 184 lb (83.5 kg)     ASSESSMENT & PLAN:   Palpitations with idioventricular rhythm: Rhythm strips provided from Apple Watch that shows sinus bradycardia with occasional to frequent PACs at times in a pattern of bigeminy along with brief episodes of idioventricular rhythm.  Reduce verapamil to 120 mg daily with continuation  of Toprol-XL 25 mg twice daily and propafenone 225 mg twice daily for now.  Place Zio patch.  Check magnesium, TSH, BMP, and CBC.  No current indication for pacemaker implantation.  CAD involving the native coronary arteries without angina: She is without symptoms concerning for angina or cardiac decompensation.  PAF: Lone episode in [place] during acute illness.  Primary cardiologist note indicates patient has requested to stop apixaban and prefers to remain off OAC.  Placed Zio patch as outlined above given frequent atrial ectopy and tachypalpitations.  She remains on Toprol-XL, propafenone,  and verapamil as outlined above.  PSVT: Zio patch pending.  Continue medical therapy as outlined above.  PVCs/NSVT: See above.  Ectatic ascending aorta: No evidence of aneurysm on most recent CTA.  HTN: Blood pressure is well-controlled in the office today.  Reducing verapamil as outlined above, otherwise she remains on HCTZ, lisinopril, and metoprolol succinate.  HLD: LDL 42 in 11/2021 with normal AST/ALT at that time.  She remains on rosuvastatin 5 mg and ezetimibe.   Disposition: F/u with Dr. Mariah Milling or an APP in 1 month.   Medication Adjustments/Labs and Tests Ordered: Current medicines are reviewed at length with the patient today.  Concerns regarding medicines are outlined above. Medication changes, Labs and Tests ordered today are summarized above and listed in the Patient Instructions accessible in Encounters.   Signed, Eula Listen, PA-C 07/18/2022 4:27 PM     [facility] [place] [address] [place], [place] [address] [phone]

## 2022-07-18 ENCOUNTER — Other Ambulatory Visit
Admission: RE | Admit: 2022-07-18 | Discharge: 2022-07-18 | Disposition: A | Discharge status: Home / Self Care | Payer: Managed Care, Other (non HMO) | Source: Ambulatory Visit | Attending: Physician Assistant | Admitting: Physician Assistant

## 2022-07-18 ENCOUNTER — Ambulatory Visit: Payer: Managed Care, Other (non HMO) | Attending: Physician Assistant | Admitting: Physician Assistant

## 2022-07-18 ENCOUNTER — Ambulatory Visit: Payer: Managed Care, Other (non HMO)

## 2022-07-18 ENCOUNTER — Encounter: Payer: Self-pay | Admitting: Physician Assistant

## 2022-07-18 VITALS — BP 112/80 | HR 55 | Ht 66.0 in | Wt 181.2 lb

## 2022-07-18 DIAGNOSIS — I4729 Other ventricular tachycardia: Secondary | ICD-10-CM

## 2022-07-18 DIAGNOSIS — R0781 Pleurodynia: Secondary | ICD-10-CM | POA: Diagnosis present

## 2022-07-18 DIAGNOSIS — I493 Ventricular premature depolarization: Secondary | ICD-10-CM | POA: Diagnosis not present

## 2022-07-18 DIAGNOSIS — I77819 Aortic ectasia, unspecified site: Secondary | ICD-10-CM

## 2022-07-18 DIAGNOSIS — I10 Essential (primary) hypertension: Secondary | ICD-10-CM

## 2022-07-18 DIAGNOSIS — I480 Paroxysmal atrial fibrillation: Secondary | ICD-10-CM | POA: Diagnosis present

## 2022-07-18 DIAGNOSIS — R002 Palpitations: Secondary | ICD-10-CM

## 2022-07-18 DIAGNOSIS — E782 Mixed hyperlipidemia: Secondary | ICD-10-CM

## 2022-07-18 DIAGNOSIS — I471 Supraventricular tachycardia, unspecified: Secondary | ICD-10-CM | POA: Diagnosis not present

## 2022-07-18 LAB — CBC
HCT: 38.7 % (ref 36.0–46.0)
Hemoglobin: 13 g/dL (ref 12.0–15.0)
MCH: 30.7 pg (ref 26.0–34.0)
MCHC: 33.6 g/dL (ref 30.0–36.0)
MCV: 91.3 fL (ref 80.0–100.0)
Platelets: 258 10*3/uL (ref 150–400)
RBC: 4.24 MIL/uL (ref 3.87–5.11)
RDW: 13.1 % (ref 11.5–15.5)
WBC: 8.8 10*3/uL (ref 4.0–10.5)
nRBC: 0 % (ref 0.0–0.2)

## 2022-07-18 LAB — MAGNESIUM: Magnesium: 1.9 mg/dL (ref 1.7–2.4)

## 2022-07-18 LAB — BASIC METABOLIC PANEL
Anion gap: 8 (ref 5–15)
BUN: 11 mg/dL (ref 8–23)
CO2: 28 mmol/L (ref 22–32)
Calcium: 8.7 mg/dL — ABNORMAL LOW (ref 8.9–10.3)
Chloride: 97 mmol/L — ABNORMAL LOW (ref 98–111)
Creatinine, Ser: 0.73 mg/dL (ref 0.44–1.00)
GFR, Estimated: >60 mL/min (ref >60)
Glucose, Bld: 147 mg/dL — ABNORMAL HIGH (ref 70–99)
Potassium: 3.8 mmol/L (ref 3.5–5.1)
Sodium: 133 mmol/L — ABNORMAL LOW (ref 135–145)

## 2022-07-18 LAB — TSH: TSH: 0.854 u[IU]/mL (ref 0.350–4.500)

## 2022-07-18 MED ORDER — VERAPAMIL HCL ER 240 MG PO TBCR: 120.0000 mg | EXTENDED_RELEASE_TABLET | Freq: Every day | ORAL | 2 refills | Status: DC

## 2022-07-18 NOTE — Patient Instructions (Signed)
Medication Instructions:  Your physician has recommended you make the following change in your medication:   verapamil (CALAN-SR) 240 MG CR tablet - Take 0.5 tablets (120 mg total) by mouth daily  *If you need a refill on your cardiac medications before your next appointment, please call your pharmacy*   Lab Work: Your physician recommends that you get lab work today: CBC, BMET, TSH, Magnesium  Sales executive at [facility] 1st desk on the right to check in (REGISTRATION)  Lab hours: Monday- Friday (7:30 am- 5:30 pm)   If you have labs (blood work) drawn today and your tests are completely normal, you will receive your results only by: MyChart Message (if you have MyChart) OR A paper copy in the mail If you have any lab test that is abnormal or we need to change your treatment, we will call you to review the results.   Testing/Procedures: Heart Monitor:  Your physician has requested you wear a ZIO XT monitor for 14 days.  Your monitor will be mailed to your home address within 3-5 business days. This is sent via [employer] from [employer]. However, if you have not received your monitor after 5 business days please send [place] a MyChart message or call the office at [phone], so we may follow up on this for you.   This monitor is a medical device (single patch monitor) that records the heart's electrical activity. Doctors most often use these monitors to diagnose arrhythmias. Arrhythmias are problems with the speed or rhythm of the heartbeat.   [employer] supplies 1 patch per enrollment. Additional stickers are not available.  Please DO NOT apply the patch if you will be having a Nuclear Stress Test, Echocardiogram, Cardiac CT, Cardiac MRI, Chest X-ray during the period you would be wearing the monitor. The patch cannot be worn during these tests.  You cannot remove and re-apply the ZIO patch monitor.   Applying the Monitor: Once you receive your monitor, this will include a  small razor, abrader, and 4 alcohol pads. Shave hair from upper left chest Rub abrader disc in 40 strokes over the left upper chest as indicated in your monitor instructions Clean area with 4 enclosed alcohol pads (there may be a mild & brief stinging sensation over the newly abraded area, but this is normal). Let dry Apply patch as indicated in monitor instructions. Patch will be placed under collarbone on the left side of the chest with arrow pointing upward. Rub adhesive wings for 2 minutes. Remove white label marked "1". Remove the white label marked "2". Rub patch adhesive wings for an 2 minutes.  While looking in a mirror, press and release button in the center of the patch. You may hear a "click". A small green light will flash 4-6 times and then stop. This will be your indicator that the monitor has been turned on.  Wearing the Monitor: Avoid showering during the first 24 hours of wearing the monitor.  After 24 hours you may shower with the patch on. Take brief showers with your back facing the shower head.  Avoid excessive sweating to help maximize wear time. Do not submerge the device, no hot tubs, and no swimming pools. Keep any lotions or oils away from the patch. Press the button if you feel a symptom. You will hear a small click. Record date, time, and symptoms in the Patient Logbook or App.  Monitor Issues: Call [employer] Customer Care at [phone] if you have questions regarding your  Zio Patch Monitor. Call them immediately if you see an orange/ amber colored light blinking on your monitor. If your monitor falls off and you cannot get this reapplied or if you need suggestions for securing your monitor call [employer] at [phone].   Returning the Monitor: Once you have completed wearing your monitor, follow instructions on the last 2 pages of the Patient Logbook. Stick monitor patch on to the last page of the Patient Logbook.  Place Patient Logbook with  monitor in the return box provided. Use locking tab on box and tape box closed securely. The return box has pre-paid postage on it.  Place the return box in the regular [place] Mail box as soon as possible It will take anywhere from 1-2 weeks for your provider to receive and review your results once you mail this back. If for some reason you have misplaced your return box then call our office and we can provide another box and/or mail it off for you.   Billing  and Patient Assistance Program Information: We have supplied [employer] with any of your insurance information on file for billing purposes. [employer] offers a sliding scale Patient Assistance Program for patients that do not have insurance, or whose insurance does not completely cover the cost of the ZIO monitor. You must apply for the Patient Assistance Program to qualify for this discounted rate. To apply, please call [employer] at [phone], select option 1, ask to apply for the Patient Assistance Program. [employer] will ask your household income, and how many people are in your household. They will quote your out-of-pocket cost based on that information. [employer] will also be able to set up for a 27-month, interest-free payment plan if needed.      Follow-Up: At [facility], you and your health needs are our priority.  As part of our continuing mission to provide you with exceptional heart care, we have created designated Provider Care Teams.  These Care Teams include your primary Cardiologist (physician) and Advanced Practice Providers (APPs -  Physician Assistants and Nurse Practitioners) who all work together to provide you with the care you need, when you need it.  We recommend signing up for the patient portal called "MyChart".  Sign up information is provided on this After Visit Summary.  MyChart is used to connect with patients for Virtual Visits (Telemedicine).  Patients are able to view lab/test results, encounter notes, upcoming  appointments, etc.  Non-urgent messages can be sent to your provider as well.   To learn more about what you can do with MyChart, go to [email or web address].    Your next appointment:   4 week(s)  Provider:   Eula Listen, PA-C    Other Instructions -None

## 2022-07-19 ENCOUNTER — Encounter: Payer: Self-pay | Admitting: Physician Assistant

## 2022-07-20 DIAGNOSIS — I4729 Other ventricular tachycardia: Secondary | ICD-10-CM

## 2022-07-20 DIAGNOSIS — R0781 Pleurodynia: Secondary | ICD-10-CM

## 2022-07-20 DIAGNOSIS — I480 Paroxysmal atrial fibrillation: Secondary | ICD-10-CM | POA: Diagnosis not present

## 2022-07-20 DIAGNOSIS — R002 Palpitations: Secondary | ICD-10-CM

## 2022-07-20 DIAGNOSIS — I10 Essential (primary) hypertension: Secondary | ICD-10-CM

## 2022-08-11 ENCOUNTER — Ambulatory Visit: Payer: Managed Care, Other (non HMO) | Admitting: Podiatry

## 2022-08-11 ENCOUNTER — Ambulatory Visit (INDEPENDENT_AMBULATORY_CARE_PROVIDER_SITE_OTHER): Payer: Managed Care, Other (non HMO)

## 2022-08-11 DIAGNOSIS — M7671 Peroneal tendinitis, right leg: Secondary | ICD-10-CM | POA: Diagnosis not present

## 2022-08-11 DIAGNOSIS — M79671 Pain in right foot: Secondary | ICD-10-CM | POA: Diagnosis not present

## 2022-08-11 NOTE — Progress Notes (Signed)
Subjective:  Patient ID: Debra Perez, female    DOB: [birth year],  MRN: [id number]   67 y.o. female presents with concern for pain in the right lateral foot.  She is concerned about possible Jones fracture as she had 1 on the other side.  That had to be surgically repaired by Dr. Logan Bores and subsequently underwent wound care due to wound complication over there.  She says she has pain primarily with motion and pressure on the area.  They have been kinesiotaping the foot which helps.  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  On Eliquis; b. CHADS2VASc => 3 (HTN, DM, female)   Sleep apnea     No Known Allergies  ROS: Negative except as per HPI above  Objective:  General: AAO x3, NAD  Dermatological: With inspection and palpation of the right and left lower extremities there are no open sores, no preulcerative lesions, no rash or signs of infection present. Nails are of normal length thickness and coloration.   Vascular:  Dorsalis Pedis artery and Posterior Tibial artery pedal pulses are 2/4 bilateral.  Capillary fill time < 3 sec to all digits.   Neruologic: Grossly intact via light touch bilateral. Protective threshold intact to all sites bilateral.   Musculoskeletal: Pain with palpation along the insertion point of the peroneus brevis tendon at the fifth metatarsal base however there is no significant pain at the fifth metatarsal base metaphyseal diaphyseal junction  Gait: Unassisted, Nonantalgic.   No images are attached to the  encounter.  Radiographs:  Date: 08/11/2022 XR the right foot Weightbearing AP/Lateral/Oblique   Findings: Attention directed the fifth metatarsal base there is very minimal calcification of the peroneus brevis near its insertion versus old avulsion fracture however there is no fracture identified of the fifth metatarsal base or at the metaphyseal diaphyseal junction region. Assessment:   1. Peroneal tendinitis, right   2. Right foot pain      Plan:  Patient was evaluated and treated and all questions answered.  # Peroneus brevis tendinitis right foot near its insertion -Discussed with patient I believe she is experiencing some irritation of the peroneus brevis tendon as it inserts in the fifth metatarsal base -Recommend steroid injection and patient was agreeable -After sterile prep I injected 1 cc half percent Marcaine plain with 1 cc Kenalog 10 into  and along the peroneus brevis tendon near its insertion of the fifth metatarsal base patient tolerated adhesive bandage was applied -Patient is taking meloxicam and recommended continued use of this for anti-inflammatory therapy  -Recommend Tri-Lock ankle brace, this will decrease motion of the peroneus brevis tendon and decrease pain 1 was dispensed to the patient this visit  Return if symptoms worsen or fail to improve.          Corinna Gab, DPM [facility]

## 2022-08-15 ENCOUNTER — Other Ambulatory Visit: Payer: Self-pay | Admitting: Podiatry

## 2022-08-15 DIAGNOSIS — M7671 Peroneal tendinitis, right leg: Secondary | ICD-10-CM

## 2022-08-15 DIAGNOSIS — M79671 Pain in right foot: Secondary | ICD-10-CM

## 2022-08-16 ENCOUNTER — Ambulatory Visit: Payer: Managed Care, Other (non HMO) | Admitting: Podiatry

## 2022-08-19 ENCOUNTER — Telehealth: Payer: Self-pay

## 2022-08-19 NOTE — Telephone Encounter (Signed)
error 

## 2022-08-19 NOTE — Progress Notes (Unsigned)
Cardiology Office Note    Date:  08/22/2022   ID:  Debra Perez, DOB [birth year], MRN [id number]  PCP:  Doreene Nest, NP  Cardiologist:  Julien Nordmann, MD  Electrophysiologist:  None   Chief Complaint: Follow up  History of Present Illness:   Debra Perez is a 67 y.o. 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   Remote stress testing from 2005 in [place] showed no significant ischemia or prior MI, normal LV size and function with an EF of 80%, adequate exercise tolerance, good blood pressure and heart rate response to exercise.  Normal study.  Cardiac CT from 2016 showed an EF of 73%, ectatic ascending aorta measuring 3.72 cm in caliber, normal caliber of the descending segment, three-vessel CAD with mild multifocal calcific narrowing of the proximal LAD and proximal ramus intermedius.  Mild calcific narrowing in the proximal RCA with minimal narrowing in the middle third.  No critical or flow-limiting disease was identified.  Echo from 2016 was technically limited due to poor acoustic windows with an EF of 60 to 65%, mild concentric LVH, diastolic dysfunction, left atrium upper limits of normal, mild posterior MVP, trace mitral regurgitation, RVSP 27 mmHg, no pericardial effusion.  Patient has previously attributed her A. fib to a URI while on steroids and albuterol.  She has previously not tolerated scheduled beta-blockers secondary to fatigue and reported weight gain.  Multaq led to hair loss.  Echo in 03/2018 showed an EF of 60 to 65%, diastolic dysfunction, normal size left atrium, normal size right atrium, no  significant valvular abnormalities, aortic root 2.8 cm, ascending aorta 3.2 cm, aortic arch 2.8 cm.  Outpatient cardiac monitoring showed an overall sinus rhythm with an average heart rate of 71 bpm.  There were 6 SVT/atrial tachycardic runs with the fastest interval lasting 5 beats with a maximum rate of 167 bpm and the longest interval lasting 14 beats with an average rate of 127 bpm.  Isolated PACs, atrial couplets, and atrial triplets were rare.  Isolated PVCs were rare.  Patient triggered events were either not associated with significant arrhythmia or with clusters of PACs.  CTA aorta on 08/17/2018 that showed an essentially unchanged thoracic aorta measuring 3.8 x 3.6 cm with minimal aortic atherosclerosis and coronary artery calcification.  She was last seen in our office in 07/2022 reporting an approximate 4-week history of randomly occurring heart rates ranging from the 30s to 140s bpm with increased fatigue, dizziness, and dyspnea at times with symptoms lasting from several seconds to episodes of tachypalpitations that would persist all day long.  Strips reviewed at that time showed sinus bradycardia, and at times frequent PVCs in a pattern of bigeminy as well as intermittent idioventricular rhythm.  She was taking Toprol-XL 25 mg twice daily, propafenone 225 mg twice daily, and verapamil 240 mg daily.  It was recommended she decrease verapamil to 120 mg daily with continuation of Toprol and propafenone.  Subsequent Zio patch demonstrated a predominant rhythm of sinus with an average rate of 64 bpm with a range of 49 to 146 bpm, 5 episodes of SVT occurred with the fastest interval lasting 6 beats with a maximum  rate of 146 bpm and the longest interval lasting 7 beats with an average rate of 121 bpm, rare PACs, atrial couplets and triplets, PVCs, and ventricular couplets.  Seven patient triggered events occurred with sinus rhythm.  She can send accompanied by her husband today and is doing well from a  cardiac perspective.  No angina, palpitations, or symptoms of cardiac decompensation.  Feels improved on lower dose verapamil.  Feels like she has more energy.  No further significant bradycardic rates.  Was started on lisinopril and developed a dry cough with this.  No falls or syncope.  No dizziness or lightheadedness.  Overall, feels well and does not have any acute concerns at this time.   Labs independently reviewed: 07/2022 - magnesium 1.9, TSH normal, potassium 3.8, BUN 11, serum creatinine 0.73, Hgb 13.0, PLT 258 05/2022 - A1c 6.9  11/2021 - albumin 4.3, AST/ALT normal, TC 160, TG 87, HDL 55, LDL 42   Past Medical History:  Diagnosis Date   Carotid artery occlusion    20 % left side   Diabetes mellitus without complication (HCC)    Diverticulosis    GERD (gastroesophageal reflux disease)    History of colonic polyps    benign   Hyperlipidemia    Hypertension    Hypothyroidism    Lung infection    Mitral valve prolapse    NSVT (nonsustained ventricular tachycardia) (HCC)    Obstructive sleep apnea    on CPAP   PAF (paroxysmal atrial fibrillation) (HCC)    a.  On Eliquis; b. CHADS2VASc => 3 (HTN, DM, female)   Sleep apnea     Past Surgical History:  Procedure Laterality Date   ABDOMINAL HYSTERECTOMY  03/07/2000   ovaries remain   CLAVICLE SURGERY  03/07/2005   right clavicle plate pins   COLONOSCOPY WITH PROPOFOL N/A 10/17/2017   Procedure: COLONOSCOPY WITH PROPOFOL;  Surgeon: [name], MD;  Location: [facility] ENDOSCOPY;  Service: Gastroenterology;  Laterality: N/A;   ESOPHAGOGASTRODUODENOSCOPY (EGD) WITH PROPOFOL N/A 10/17/2017   Procedure: ESOPHAGOGASTRODUODENOSCOPY (EGD) WITH PROPOFOL;  Surgeon: [name], MD;  Location: [facility] ENDOSCOPY;  Service: Gastroenterology;  Laterality: N/A;   GIVENS CAPSULE STUDY N/A 11/30/2017   Procedure: GIVENS CAPSULE STUDY;  Surgeon: [name], MD;  Location: [facility] ENDOSCOPY;  Service: Gastroenterology;  Laterality: N/A;   KNEE  ARTHROSCOPY  03/07/2010   Left foot surgery Left 03/03/2022    Current Medications: Current Meds  Medication Sig   Accu-Chek FastClix Lancets MISC USE TO TEST BLOOD SUGAR TWICE DAILY AS DIRECTED   [name] 50 MG tablet Take 1 tablet (50 mg total) by mouth at bedtime.   b complex vitamins capsule Take 1 capsule by mouth 2 (two) times daily.   Continuous Blood Gluc Sensor (FREESTYLE LIBRE 3 SENSOR) MISC Place 1 sensor on the skin every 14 days. Use to check glucose continuously   ezetimibe (ZETIA) 10 MG tablet TAKE 1 TABLET BY MOUTH DAILY   famotidine (PEPCID) 20 MG tablet Take 1 tablet (20 mg total) by mouth 2 (two) times daily. For heartburn.   gentamicin ointment (GARAMYCIN) 0.1 % Apply 1 application topically daily as needed. For nasal sore.   glipiZIDE (GLUCOTROL XL) 10 MG 24 hr tablet TAKE 1 TABLET BY MOUTH DAILY WITH BREAKFAST FOR DIABETES   hydrochlorothiazide (HYDRODIURIL) 25 MG tablet Take 1 tablet (25 mg total) by mouth daily.   ipratropium (ATROVENT HFA) 17 MCG/ACT inhaler Inhale 2 puffs into the lungs every 4 (four) hours as needed for wheezing.  Iron-Vitamin C 65-125 MG TABS Take by mouth daily.    losartan (COZAAR) 50 MG tablet Take 1 tablet (50 mg total) by mouth daily.   Magnesium 500 MG CAPS Take 1 capsule by mouth 2 (two) times daily.   methocarbamol (ROBAXIN) 750 MG tablet Take 375-750 mg by mouth at bedtime as needed.   metoprolol succinate (TOPROL-XL) 25 MG 24 hr tablet Take 1 tablet (25 mg total) by mouth 2 (two) times daily. Take with or immediately following a meal.   Multiple Vitamins-Minerals (MULTIVITAMIN ADULT PO) Take 1 capsule by mouth daily.    propafenone (RYTHMOL SR) 225 MG 12 hr capsule Take 1 capsule (225 mg total) by mouth 2 (two) times daily.   rosuvastatin (CRESTOR) 5 MG tablet TAKE 1 TABLET BY MOUTH DAILY GENERIC EQUIVALENT FOR CRESTOR   sitaGLIPtin-metformin (JANUMET) 50-1000 MG tablet TAKE 1 TABLET TWICE A DAY WITH MEALS FOR DIABETES    SYNTHROID 88 MCG tablet Take 1 tablet by mouth every morning on an empty stomach with water only.  No food or other medications for 30 minutes.   verapamil (CALAN-SR) 240 MG CR tablet Take 0.5 tablets (120 mg total) by mouth daily.   [DISCONTINUED] lisinopril (ZESTRIL) 20 MG tablet Take 1 tablet (20 mg total) by mouth daily.    Allergies:   Patient has no known allergies.   Social History   Socioeconomic History   Marital status: Married    Spouse name: Not on file   Number of children: Not on file   Years of education: Not on file   Highest education level: Not on file  Occupational History   Occupation: retired  Tobacco Use   Smoking status: Former    Packs/day: 1.00    Years: 20.00    Additional pack years: 0.00    Total pack years: 20.00    Types: Cigarettes    Quit date: 03/07/2004    Years since quitting: 18.4   Smokeless tobacco: Never  Vaping Use   Vaping Use: Never used  Substance and Sexual Activity   Alcohol use: No    Alcohol/week: 0.0 standard drinks of alcohol   Drug use: No   Sexual activity: Not on file  Other Topics Concern   Not on file  Social History Narrative   Married.   1 daughter.   Moved here from [place] due to husbands occupation.   She is a Public house manager.   Enjoys sewing, gardening.   Social Determinants of Health   Financial Resource Strain: Not on file  Food Insecurity: Not on file  Transportation Needs: Not on file  Physical Activity: Not on file  Stress: Not on file  Social Connections: Not on file     Family History:  The patient's family history includes Arthritis in her father; Bladder Cancer in her maternal grandmother and sister; Breast cancer in her paternal grandmother; Cancer in her paternal grandfather; Depression in her mother; Diabetes in her brother, maternal grandfather, paternal grandfather, and sister; Emphysema in her brother and father; Heart attack in her mother; Heart disease in her brother and father; Hypertension in her  brother and father; Sudden death (age of onset: 37) in her mother.  ROS:   12-point review of systems is negative unless otherwise noted in the HPI.   EKGs/Labs/Other Studies Reviewed:    Studies reviewed were summarized above. The additional studies were reviewed today:  Zio patch 07/2022: Normal sinus rhythm Patient had a min HR of 49 bpm, max HR of 146 bpm,  and avg HR of 64 bpm.    5 Supraventricular Tachycardia runs occurred, the run with the fastest interval lasting 6 beats with a max rate of 146 bpm, the longest lasting 7 beats with an avg rate of 121 bpm.    Isolated SVEs were rare (<1.0%), SVE Couplets were rare (<1.0%), and SVE Triplets were rare (<1.0%).  Isolated VEs were rare (<1.0%), VE Couplets were rare (<1.0%), and no VE Triplets were present.   Patient triggered events (7) associated with normal sinus rhythm __________  2D echo 04/06/2018: 1. The left ventricle has normal systolic function of 60-65%. The cavity  size is normal. There is no left ventricular wall thickness. Echo evidence  of impaired relaxation diastolic filling patterns.   2. Normal left atrial size.   3. Normal right atrial size.   4. Normal tricuspid valve.   5. There is normal right ventricular systolic function. b Right  ventricular systolic pressure could not be assessed.  __________   Luci [place] patch 03/2018: Normal sinus rhythm Average Heart rate: 71 bpm.    6 Supraventricular Tachycardia/atrial tachyardia runs occurred, the run with the fastest interval lasting 5 beats with a max rate of 167 bpm,  the longest lasting 14 beats with an avg rate of 127 bpm.    Isolated SVEs were rare (<1.0%), SVE Couplets were rare (<1.0%), and SVE Triplets were rare (<1.0%).  Isolated VEs were rare (<1.0%), and no VE Couplets or VE Triplets were present.   Patient triggered events were either not associated with significant arrhythmia, or with clusters of APCs.   EKG:  EKG is ordered today.  The EKG ordered  today demonstrates NSR, 60 bpm, RBBB, nonspecific st/t changes consistent with prior tracing  Recent Labs: 11/30/2021: ALT 26 07/18/2022: BUN 11; Creatinine, Ser 0.73; Hemoglobin 13.0; Magnesium 1.9; Platelets 258; Potassium 3.8; Sodium 133; TSH 0.854  Recent Lipid Panel    Component Value Date/Time   CHOL 116 11/30/2021 0918   TRIG 87.0 11/30/2021 0918   HDL 55.90 11/30/2021 0918   CHOLHDL 2 11/30/2021 0918   VLDL 17.4 11/30/2021 0918   LDLCALC 42 11/30/2021 0918    PHYSICAL EXAM:    VS:  BP 132/76 (BP Location: Left Arm, Patient Position: Sitting, Cuff Size: Normal)   Pulse 60   Ht 5\' 6"  (1.676 m)   Wt 175 lb 9.6 oz (79.7 kg)   SpO2 95%   BMI 28.34 kg/m   BMI: Body mass index is 28.34 kg/m.  Physical Exam Vitals reviewed.  Constitutional:      Appearance: She is well-developed.  HENT:     Head: Normocephalic and atraumatic.  Eyes:     General:        Right eye: No discharge.        Left eye: No discharge.  Neck:     Vascular: No JVD.  Cardiovascular:     Rate and Rhythm: Normal rate and regular rhythm.     Pulses:          Posterior tibial pulses are 2+ on the right side and 2+ on the left side.     Heart sounds: Normal heart sounds, S1 normal and S2 normal. Heart sounds not distant. No midsystolic click and no opening snap. No murmur heard.    No friction rub.  Pulmonary:     Effort: Pulmonary effort is normal. No respiratory distress.     Breath sounds: Normal breath sounds. No decreased breath sounds, wheezing or rales.  Chest:  Chest wall: No tenderness.  Abdominal:     General: There is no distension.  Musculoskeletal:     Cervical back: Normal range of motion.     Right lower leg: No edema.     Left lower leg: No edema.  Skin:    General: Skin is warm and dry.     Nails: There is no clubbing.  Neurological:     Mental Status: She is alert and oriented to person, place, and time.  Psychiatric:        Speech: Speech normal.        Behavior:  Behavior normal.        Thought Content: Thought content normal.        Judgment: Judgment normal.     Wt Readings from Last 3 Encounters:  08/22/22 175 lb 9.6 oz (79.7 kg)  07/18/22 181 lb 3.2 oz (82.2 kg)  05/10/22 179 lb (81.2 kg)     ASSESSMENT & PLAN:   Palpitations with idioventricular rhythm/PSVT/PVCs/NSVT: Zio patch demonstrated 6 episodes of PSVT with the longest episode lasting just 14 beats.  Rare PVCs.  No NSVT.  Palpitations and bradycardic rates improved on lower dose of verapamil 120 mg daily which will be continued along with Toprol-XL 25 mg twice daily and propafenone 225 mg twice daily.  CAD involving the native coronary arteries without angina: No symptoms concerning for angina or cardiac decompensation.  Recommend risk factor modification and ongoing medical therapy including rosuvastatin and ezetimibe.  PAF: Lone episode in [place] during acute illness.  Has preferred to remain off OAC.  Recent Zio patch showed no evidence of A-fib.  She remains on Toprol-XL, propafenone, and verapamil as outlined above.  Ectatic ascending aorta: No evidence of aneurysm on most recent CTA.  HTN: Blood pressure is well-controlled in the office today.  Discontinue lisinopril with noted cough.  Start losartan 50 mg daily with a follow-up BMP in 1 week.  Otherwise, she remains on HCTZ, verapamil, and Toprol-XL.  HLD: LDL 42 in 11/2021 with normal AST/ALT at that time.  She remains on rosuvastatin 5 mg and ezetimibe.   Disposition: F/u with Dr. Mariah Milling or an APP in 6 months.   Medication Adjustments/Labs and Tests Ordered: Current medicines are reviewed at length with the patient today.  Concerns regarding medicines are outlined above. Medication changes, Labs and Tests ordered today are summarized above and listed in the Patient Instructions accessible in Encounters.   Signed, Eula Listen, PA-C 08/22/2022 9:10 AM     [facility] [address] [place], [place] [address] [phone]

## 2022-08-22 ENCOUNTER — Ambulatory Visit: Payer: Managed Care, Other (non HMO) | Attending: Physician Assistant | Admitting: Physician Assistant

## 2022-08-22 ENCOUNTER — Encounter: Payer: Self-pay | Admitting: Physician Assistant

## 2022-08-22 VITALS — BP 132/76 | HR 60 | Ht 66.0 in | Wt 175.6 lb

## 2022-08-22 DIAGNOSIS — I480 Paroxysmal atrial fibrillation: Secondary | ICD-10-CM

## 2022-08-22 DIAGNOSIS — I10 Essential (primary) hypertension: Secondary | ICD-10-CM

## 2022-08-22 DIAGNOSIS — I493 Ventricular premature depolarization: Secondary | ICD-10-CM

## 2022-08-22 DIAGNOSIS — R002 Palpitations: Secondary | ICD-10-CM

## 2022-08-22 DIAGNOSIS — I2510 Atherosclerotic heart disease of native coronary artery without angina pectoris: Secondary | ICD-10-CM | POA: Diagnosis not present

## 2022-08-22 DIAGNOSIS — I4729 Other ventricular tachycardia: Secondary | ICD-10-CM

## 2022-08-22 DIAGNOSIS — E782 Mixed hyperlipidemia: Secondary | ICD-10-CM

## 2022-08-22 DIAGNOSIS — I471 Supraventricular tachycardia, unspecified: Secondary | ICD-10-CM | POA: Diagnosis not present

## 2022-08-22 MED ORDER — LOSARTAN POTASSIUM 50 MG PO TABS: 50.0000 mg | ORAL_TABLET | Freq: Every day | ORAL | 3 refills | Status: DC

## 2022-08-22 NOTE — Patient Instructions (Signed)
Medication Instructions:  Your physician recommends the following medication changes.  STOP TAKING: Lisinopril  START TAKING: Losartan 50 mg once daily    *If you need a refill on your cardiac medications before your next appointment, please call your pharmacy*   Lab Work: Your provider would like for you to return in 1 week to have the following labs drawn: BMP.   Please go to the [facility] entrance and check in at the front desk.  You do not need an appointment.  They are open from 7am-6 pm.  You don't need to be fasting.  If you have labs (blood work) drawn today and your tests are completely normal, you will receive your results only by: MyChart Message (if you have MyChart) OR A paper copy in the mail If you have any lab test that is abnormal or we need to change your treatment, we will call you to review the results.   Testing/Procedures: none   Follow-Up: At [facility], you and your health needs are our priority.  As part of our continuing mission to provide you with exceptional heart care, we have created designated Provider Care Teams.  These Care Teams include your primary Cardiologist (physician) and Advanced Practice Providers (APPs -  Physician Assistants and Nurse Practitioners) who all work together to provide you with the care you need, when you need it.  We recommend signing up for the patient portal called "MyChart".  Sign up information is provided on this After Visit Summary.  MyChart is used to connect with patients for Virtual Visits (Telemedicine).  Patients are able to view lab/test results, encounter notes, upcoming appointments, etc.  Non-urgent messages can be sent to your provider as well.   To learn more about what you can do with MyChart, go to [email or web address].    Your next appointment:   6 month(s)  Provider:   Julien Nordmann, MD or Eula Listen, PA-C

## 2022-09-05 ENCOUNTER — Other Ambulatory Visit
Admission: RE | Admit: 2022-09-05 | Discharge: 2022-09-05 | Disposition: A | Discharge status: Home / Self Care | Payer: Managed Care, Other (non HMO) | Attending: Physician Assistant | Admitting: Physician Assistant

## 2022-09-05 DIAGNOSIS — I2510 Atherosclerotic heart disease of native coronary artery without angina pectoris: Secondary | ICD-10-CM | POA: Insufficient documentation

## 2022-09-05 LAB — BASIC METABOLIC PANEL
Anion gap: 8 (ref 5–15)
BUN: 14 mg/dL (ref 8–23)
CO2: 29 mmol/L (ref 22–32)
Calcium: 9.4 mg/dL (ref 8.9–10.3)
Chloride: 99 mmol/L (ref 98–111)
Creatinine, Ser: 0.79 mg/dL (ref 0.44–1.00)
GFR, Estimated: >60 mL/min (ref >60)
Glucose, Bld: 197 mg/dL — ABNORMAL HIGH (ref 70–99)
Potassium: 3.9 mmol/L (ref 3.5–5.1)
Sodium: 136 mmol/L (ref 135–145)

## 2022-09-14 ENCOUNTER — Ambulatory Visit (INDEPENDENT_AMBULATORY_CARE_PROVIDER_SITE_OTHER): Payer: Managed Care, Other (non HMO) | Admitting: Podiatry

## 2022-09-14 ENCOUNTER — Encounter: Payer: Self-pay | Admitting: Podiatry

## 2022-09-14 VITALS — BP 145/63 | HR 62

## 2022-09-14 DIAGNOSIS — M7671 Peroneal tendinitis, right leg: Secondary | ICD-10-CM

## 2022-09-26 DIAGNOSIS — M7671 Peroneal tendinitis, right leg: Secondary | ICD-10-CM | POA: Diagnosis not present

## 2022-09-26 MED ORDER — BETAMETHASONE SOD PHOS & ACET 6 (3-3) MG/ML IJ SUSP
3.0000 mg | Freq: Once | INTRAMUSCULAR | Status: AC
Start: 2022-09-26 — End: 2022-09-26
  Administered 2022-09-26: 3 mg via INTRA_ARTICULAR

## 2022-09-26 NOTE — Progress Notes (Signed)
   Chief Complaint  Patient presents with   Foot Pain    "He gave me an injection in my foot and it seems like it made it worse.  The pain has traveled on top of my foot and around the heel."    HPI: 67 y.o. female presenting today for follow-up evaluation of right foot pain.  Patient received an injection on 08/11/2022 by another provider here in our practice.  She says that the injection possibly made the pain worse.  Her left foot is doing great with no pain.  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  On Eliquis; b. CHADS2VASc => 3 (HTN, DM, female)   Sleep apnea     Past Surgical History:  Procedure Laterality Date   ABDOMINAL HYSTERECTOMY  03/07/2000   ovaries remain   CLAVICLE SURGERY  03/07/2005   right clavicle plate pins   COLONOSCOPY WITH PROPOFOL N/A 10/17/2017   Procedure: COLONOSCOPY WITH PROPOFOL;  Surgeon: [name], MD;  Location: [facility] ENDOSCOPY;  Service: Gastroenterology;  Laterality: N/A;   ESOPHAGOGASTRODUODENOSCOPY (EGD) WITH PROPOFOL N/A 10/17/2017   Procedure: ESOPHAGOGASTRODUODENOSCOPY (EGD) WITH PROPOFOL;  Surgeon: [name], MD;  Location: [facility] ENDOSCOPY;  Service: Gastroenterology;  Laterality: N/A;   GIVENS CAPSULE STUDY N/A 11/30/2017   Procedure: GIVENS CAPSULE STUDY;  Surgeon: [name], MD;  Location: [facility] ENDOSCOPY;  Service: Gastroenterology;  Laterality: N/A;   KNEE ARTHROSCOPY  03/07/2010   Left foot surgery Left 03/03/2022    No Known Allergies   Physical Exam: General: The patient is alert and oriented x3 in  no acute distress.  Dermatology: Skin is warm, dry and supple bilateral lower extremities.   Vascular: Palpable pedal pulses bilaterally. Capillary refill within normal limits.  No appreciable edema.  No erythema.  Neurological: Grossly intact via light touch  Musculoskeletal Exam: No pedal deformities noted.  There continues to be tenderness with palpation along the lateral aspect of the right foot around the fifth metatarsal tubercle   Assessment/Plan of Care: 1.  Insertional peroneal tendinitis right  -Patient evaluated.  X-rays taken 08/11/2022 were reviewed. -Injection of 0.5 cc Celestone Soluspan injected around the fifth metatarsal tubercle right.  Patient has had good success with injections in the past administered by me.  Unfortunately she did not have good results last visit with a different provider. -Return to clinic 4 weeks       Felecia Shelling, DPM [facility]  Dr. Felecia Shelling, DPM    [address] [place], [place] [address]                Office [phone]  Fax [phone]

## 2022-09-28 ENCOUNTER — Other Ambulatory Visit: Payer: Self-pay | Admitting: Cardiovascular Disease

## 2022-09-29 ENCOUNTER — Other Ambulatory Visit: Payer: Self-pay | Admitting: Primary Care

## 2022-09-29 DIAGNOSIS — E1159 Type 2 diabetes mellitus with other circulatory complications: Secondary | ICD-10-CM

## 2022-10-14 ENCOUNTER — Ambulatory Visit: Payer: Managed Care, Other (non HMO) | Admitting: Podiatry

## 2022-10-26 ENCOUNTER — Other Ambulatory Visit: Payer: Self-pay | Admitting: Primary Care

## 2022-10-26 DIAGNOSIS — G8929 Other chronic pain: Secondary | ICD-10-CM

## 2022-10-31 MED ORDER — VERAPAMIL HCL ER 120 MG PO TBCR: 120.0000 mg | EXTENDED_RELEASE_TABLET | Freq: Every day | ORAL | 3 refills | Status: DC

## 2022-10-31 MED ORDER — LOSARTAN POTASSIUM 50 MG PO TABS: 50.0000 mg | ORAL_TABLET | Freq: Every day | ORAL | 3 refills | Status: DC

## 2022-11-14 ENCOUNTER — Other Ambulatory Visit: Payer: Self-pay | Admitting: Cardiovascular Disease

## 2022-11-14 ENCOUNTER — Other Ambulatory Visit: Payer: Self-pay | Admitting: Primary Care

## 2022-11-14 DIAGNOSIS — E039 Hypothyroidism, unspecified: Secondary | ICD-10-CM

## 2022-12-06 ENCOUNTER — Encounter: Payer: Self-pay | Admitting: Primary Care

## 2022-12-06 ENCOUNTER — Ambulatory Visit (INDEPENDENT_AMBULATORY_CARE_PROVIDER_SITE_OTHER): Payer: Managed Care, Other (non HMO) | Admitting: Primary Care

## 2022-12-06 VITALS — BP 130/82 | HR 59 | Temp 97.3°F | Ht 66.0 in | Wt 173.0 lb

## 2022-12-06 DIAGNOSIS — E1159 Type 2 diabetes mellitus with other circulatory complications: Secondary | ICD-10-CM | POA: Diagnosis not present

## 2022-12-06 DIAGNOSIS — I25119 Atherosclerotic heart disease of native coronary artery with unspecified angina pectoris: Secondary | ICD-10-CM

## 2022-12-06 DIAGNOSIS — K219 Gastro-esophageal reflux disease without esophagitis: Secondary | ICD-10-CM

## 2022-12-06 DIAGNOSIS — E782 Mixed hyperlipidemia: Secondary | ICD-10-CM | POA: Diagnosis not present

## 2022-12-06 DIAGNOSIS — Z0000 Encounter for general adult medical examination without abnormal findings: Secondary | ICD-10-CM

## 2022-12-06 DIAGNOSIS — I4729 Other ventricular tachycardia: Secondary | ICD-10-CM | POA: Diagnosis not present

## 2022-12-06 DIAGNOSIS — Z1211 Encounter for screening for malignant neoplasm of colon: Secondary | ICD-10-CM

## 2022-12-06 DIAGNOSIS — M5441 Lumbago with sciatica, right side: Secondary | ICD-10-CM

## 2022-12-06 DIAGNOSIS — I10 Essential (primary) hypertension: Secondary | ICD-10-CM

## 2022-12-06 DIAGNOSIS — G8929 Other chronic pain: Secondary | ICD-10-CM

## 2022-12-06 DIAGNOSIS — I480 Paroxysmal atrial fibrillation: Secondary | ICD-10-CM

## 2022-12-06 DIAGNOSIS — E039 Hypothyroidism, unspecified: Secondary | ICD-10-CM | POA: Diagnosis not present

## 2022-12-06 DIAGNOSIS — Z7984 Long term (current) use of oral hypoglycemic drugs: Secondary | ICD-10-CM

## 2022-12-06 LAB — COMPREHENSIVE METABOLIC PANEL
ALT: 19 U/L (ref 0–35)
AST: 16 U/L (ref 0–37)
Albumin: 4 g/dL (ref 3.5–5.2)
Alkaline Phosphatase: 38 U/L — ABNORMAL LOW (ref 39–117)
BUN: 10 mg/dL (ref 6–23)
CO2: 32 meq/L (ref 19–32)
Calcium: 9.3 mg/dL (ref 8.4–10.5)
Chloride: 97 meq/L (ref 96–112)
Creatinine, Ser: 0.84 mg/dL (ref 0.40–1.20)
GFR: 73.24 mL/min (ref >60.00)
Glucose, Bld: 114 mg/dL — ABNORMAL HIGH (ref 70–99)
Potassium: 4.5 meq/L (ref 3.5–5.1)
Sodium: 136 meq/L (ref 135–145)
Total Bilirubin: 0.4 mg/dL (ref 0.2–1.2)
Total Protein: 6.4 g/dL (ref 6.0–8.3)

## 2022-12-06 LAB — LIPID PANEL
Cholesterol: 117 mg/dL (ref 0–200)
HDL: 49.8 mg/dL (ref >39.00)
LDL Cholesterol: 46 mg/dL (ref 0–99)
NonHDL: 67.49
Total CHOL/HDL Ratio: 2
Triglycerides: 107 mg/dL (ref 0.0–149.0)
VLDL: 21.4 mg/dL (ref 0.0–40.0)

## 2022-12-06 LAB — TSH: TSH: 1.3 u[IU]/mL (ref 0.35–5.50)

## 2022-12-06 LAB — HEMOGLOBIN A1C: Hgb A1c MFr Bld: 7 % — ABNORMAL HIGH (ref 4.6–6.5)

## 2022-12-06 MED ORDER — GLIPIZIDE ER 5 MG PO TB24
5.0000 mg | ORAL_TABLET | Freq: Every day | ORAL | 1 refills | Status: DC
Start: 2022-12-06 — End: 2023-04-27

## 2022-12-06 NOTE — Assessment & Plan Note (Addendum)
Controlled. Following with cardiology, office notes reviewed from June 2024.  Continue losartan 50 mg daily, cough has resolved since discontinuation of ACE inhibitor. Continue hydrochlorothiazide 25 mg daily. Continue metoprolol succinate 25 mg daily. Continue verapamil 120 mg for rate control.  CMP pending.

## 2022-12-06 NOTE — Assessment & Plan Note (Signed)
Rate and rhythm controlled.  No atrial fibrillation noted on exam. Reviewed ZIO heart monitor result from June 2024.  Continue Rythmol SR to 25 mg twice daily, verapamil 120 mg daily, metoprolol succinate 25 mg daily, aspirin 81 mg daily

## 2022-12-06 NOTE — Assessment & Plan Note (Signed)
Following with physical medicine.  Continue methocarbamol375-500 milligrams at bedtime as needed, amitriptyline 50 mg at bedtime

## 2022-12-06 NOTE — Assessment & Plan Note (Signed)
Overall controlled.  Continue famotidine 20 mg twice daily.

## 2022-12-06 NOTE — Assessment & Plan Note (Signed)
Immunizations UTD.  Will get flu shot at pharmacy  Mammogram up-to-date. Colonoscopy due, referral placed.  Discussed the importance of a healthy diet and regular exercise in order for weight loss, and to reduce the risk of further co-morbidity.  Exam stable. Labs pending.  Follow up in 1 year for repeat physical.

## 2022-12-06 NOTE — Assessment & Plan Note (Signed)
Asymptomatic.  Following with cardiology, office notes reviewed from June 2024.  Continue metoprolol succinate 25 mg twice daily, losartan 50 mg daily, aspirin 81 mg daily, lipid control, diabetes control, blood pressure control.

## 2022-12-06 NOTE — Patient Instructions (Addendum)
You will either be contacted via phone regarding your referral to GI for the colonoscopy, or you may receive a letter on your MyChart portal from our referral team with instructions for scheduling an appointment. Please let us know if you have not been contacted by anyone within two weeks.  Stop by the lab prior to leaving today. I will notify you of your results once received.   Please schedule a follow up visit for 6 months for a diabetes check.  It was a pleasure to see you today!

## 2022-12-06 NOTE — Progress Notes (Signed)
Subjective:    Patient ID: Debra Perez, female    DOB: 1955-09-02, 67 y.o.   MRN: [id number]  HPI  Debra Perez is a very pleasant 67 y.o. female who presents today for complete physical and follow up of chronic conditions.  Immunizations: -Tetanus: Completed in 2016 -Influenza: Will complete in a few weeks -Shingles: Completed Shingrix series -Pneumonia: Completed 2021  Diet: Fair diet.  Exercise: No regular exercise.  Eye exam: Completes annually  Dental exam: Completes semi-annually    Pap Smear: Hysterectomy Mammogram: Completed in December 2023  Colonoscopy: Completed in 2019, due 2024   BP Readings from Last 3 Encounters:  12/06/22 130/82  09/14/22 (!) 145/63  08/22/22 132/76     Review of Systems  Constitutional:  Negative for unexpected weight change.  HENT:  Negative for rhinorrhea.   Respiratory:  Negative for cough and shortness of breath.   Cardiovascular:  Negative for chest pain.  Gastrointestinal:  Negative for constipation and diarrhea.  Genitourinary:  Negative for difficulty urinating and menstrual problem.  Musculoskeletal:  Positive for arthralgias and back pain.  Skin:  Negative for rash.  Allergic/Immunologic: Negative for environmental allergies.  Neurological:  Negative for dizziness, numbness and headaches.  Psychiatric/Behavioral:  The patient is not nervous/anxious.          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  On Eliquis; b. CHADS2VASc => 3 (HTN, DM, female)    Skin ulcer of left ankle, limited to breakdown of skin (HCC) 04/02/2020   Sleep apnea     Social History   Socioeconomic History   Marital status: Married    Spouse name: Not on file   Number of children: Not on file   Years of education: Not on file   Highest education level: Not on file  Occupational History   Occupation: retired  Tobacco Use   Smoking status: Former    Current packs/day: 0.00    Average packs/day: 1 pack/day for 20.0 years (20.0 ttl pk-yrs)    Types: Cigarettes    Start date: 03/07/1984    Quit date: 03/07/2004    Years since quitting: 18.7   Smokeless tobacco: Never  Vaping Use   Vaping status: Never Used  Substance and Sexual Activity   Alcohol use: No    Alcohol/week: 0.0 standard drinks of alcohol   Drug use: No   Sexual activity: Not on file  Other Topics Concern   Not on file  Social History Narrative   Married.   1 daughter.   Moved here from [place] due to husbands occupation.   She is a Public house manager.   Enjoys sewing, gardening.   Social Determinants of Health   Financial Resource Strain: Not on file  Food Insecurity: Not on file  Transportation Needs: Not on file  Physical Activity: Not on file  Stress: Not on file  Social Connections: Not on file  Intimate Partner Violence: Not on file  Past Surgical History:  Procedure Laterality Date   ABDOMINAL HYSTERECTOMY  03/07/2000   ovaries remain   CLAVICLE SURGERY  03/07/2005   right clavicle plate pins   COLONOSCOPY WITH PROPOFOL N/A 10/17/2017   Procedure: COLONOSCOPY WITH PROPOFOL;  Surgeon: [name], MD;  Location: [facility];  Service: Gastroenterology;  Laterality: N/A;   ESOPHAGOGASTRODUODENOSCOPY (EGD) WITH PROPOFOL N/A 10/17/2017   Procedure: ESOPHAGOGASTRODUODENOSCOPY (EGD) WITH PROPOFOL;  Surgeon: [name], MD;  Location[facility];  Service: Gastroenterology;  Laterality: N/A;   GIVENS CAPSULE STUDY N/A 11/30/2017   Procedure: GIVENS CAPSULE STUDY;  Surgeon: [name], MD;  Location: [facility] ENDOSCOPY;  Service: Gastroenterology;  Laterality: N/A;   KNEE ARTHROSCOPY  03/07/2010   Left foot surgery Left 03/03/2022    Family History  Problem Relation Age of Onset   Depression Mother    Sudden death Mother 32   Heart attack Mother    Arthritis Father    Heart disease Father    Hypertension Father    Emphysema Father    Bladder Cancer Sister    Diabetes Sister        Type 2   Diabetes Brother        type   Hypertension Brother    Emphysema Brother    Heart disease Brother    Diabetes Maternal Grandfather    Bladder Cancer Maternal Grandmother        sinus cancer    Breast cancer Paternal Grandmother    Cancer Paternal Grandfather        colon cancer   Diabetes Paternal Grandfather     No Known Allergies  Current Outpatient Medications on File Prior to Visit  Medication Sig Dispense Refill   Accu-Chek FastClix Lancets MISC USE TO TEST BLOOD SUGAR TWICE DAILY AS DIRECTED 204 each 1   [name] 50 MG tablet TAKE 1 TABLET BY MOUTH AT BEDTIME FOR BACK PAIN 90 tablet 0   aspirin EC 81 MG tablet Take 81 mg by mouth daily. Swallow whole.     b complex vitamins capsule Take 1 capsule by mouth 2 (two) times daily.     Continuous Glucose Sensor (FREESTYLE LIBRE 3 SENSOR) MISC PLACE 1 SENSOR ON THE SKIN EVERY 14 DAYS. USE TO CHECK GLUCOSE CONTINUOUSLY 6 each 3   ezetimibe (ZETIA) 10 MG tablet TAKE 1 TABLET BY MOUTH DAILY 90 tablet 2   famotidine (PEPCID) 20 MG tablet Take 1 tablet (20 mg total) by mouth 2 (two) times daily. For heartburn. 180 tablet 1   gentamicin ointment (GARAMYCIN) 0.1 % Apply 1 application topically daily as needed. For nasal sore. 15 g 0   hydrochlorothiazide (HYDRODIURIL) 25 MG tablet Take 1 tablet (25 mg total) by mouth daily. 90 tablet 3   ipratropium (ATROVENT HFA) 17 MCG/ACT inhaler Inhale 2 puffs into the lungs every 4 (four) hours as needed for wheezing. 1 each 0   Iron-Vitamin C 65-125 MG TABS Take by  mouth daily.      losartan (COZAAR) 50 MG tablet Take 1 tablet (50 mg total) by mouth daily. 90 tablet 3   Magnesium 500 MG CAPS Take 1 capsule by mouth 2 (two) times daily.     methocarbamol (ROBAXIN) 750 MG tablet Take 375-750 mg by mouth at bedtime as needed.     metoprolol succinate (TOPROL-XL) 25 MG 24 hr tablet Take 1 tablet (25 mg total) by mouth 2 (two) times daily. Take with or immediately following a meal. 180 tablet 2  Multiple Vitamins-Minerals (MULTIVITAMIN ADULT PO) Take 1 capsule by mouth daily.      propafenone (RYTHMOL SR) 225 MG 12 hr capsule TAKE 1 CAPSULE BY MOUTH 2 TIMES DAILY 180 capsule 0   rosuvastatin (CRESTOR) 5 MG tablet TAKE 1 TABLET BY MOUTH DAILY GENERIC EQUIVALENT FOR CRESTOR 90 tablet 3   sitaGLIPtin-metformin (JANUMET) 50-1000 MG tablet TAKE 1 TABLET TWICE A DAY WITH MEALS FOR DIABETES 180 tablet 1   SYNTHROID 88 MCG tablet Take 1 tablet by mouth every morning on an empty stomach with water only.  No food or other medications for 30 minutes. 90 tablet 0   verapamil (CALAN-SR) 120 MG CR tablet Take 1 tablet (120 mg total) by mouth daily. 90 tablet 3   Current Facility-Administered Medications on File Prior to Visit  Medication Dose Route Frequency Provider Last Rate Last Admin   heparin lock flush 100 unit/mL  500 Units Intracatheter Once PRN Rickard Patience, MD       sodium chloride flush (NS) 0.9 % injection 10 mL  10 mL Intracatheter PRN Rickard Patience, MD   10 mL at 10/06/17 1515    BP 130/82   Pulse (!) 59   Temp (!) 97.3 F (36.3 C) (Temporal)   Ht 5\' 6"  (1.676 m)   Wt 173 lb (78.5 kg)   SpO2 98%   BMI 27.92 kg/m  Objective:   Physical Exam HENT:     Right Ear: Tympanic membrane and ear canal normal.     Left Ear: Tympanic membrane and ear canal normal.  Eyes:     Pupils: Pupils are equal, round, and reactive to light.  Cardiovascular:     Rate and Rhythm: Normal rate and regular rhythm.  Pulmonary:     Effort: Pulmonary effort is normal.     Breath  sounds: Normal breath sounds.  Abdominal:     General: Bowel sounds are normal.     Palpations: Abdomen is soft.     Tenderness: There is no abdominal tenderness.  Musculoskeletal:        General: Normal range of motion.     Cervical back: Neck supple.  Skin:    General: Skin is warm and dry.  Neurological:     Mental Status: She is alert and oriented to person, place, and time.     Cranial Nerves: No cranial nerve deficit.     Deep Tendon Reflexes:     Reflex Scores:      Patellar reflexes are 2+ on the right side and 2+ on the left side. Psychiatric:        Mood and Affect: Mood normal.           Assessment & Plan:  Preventative health care Assessment & Plan: Immunizations UTD.  Will get flu shot at pharmacy  Mammogram up-to-date. Colonoscopy due, referral placed.  Discussed the importance of a healthy diet and regular exercise in order for weight loss, and to reduce the risk of further co-morbidity.  Exam stable. Labs pending.  Follow up in 1 year for repeat physical.    Coronary artery disease involving native coronary artery of native heart with angina pectoris [facility]) Assessment & Plan: Asymptomatic.  Following with cardiology, office notes reviewed from June 2024.  Continue metoprolol succinate 25 mg twice daily, losartan 50 mg daily, aspirin 81 mg daily, lipid control, diabetes control, blood pressure control.     Essential hypertension Assessment & Plan: Controlled. Following with cardiology, office notes reviewed from June 2024.  Continue  losartan 50 mg daily, cough has resolved since discontinuation of ACE inhibitor. Continue hydrochlorothiazide 25 mg daily. Continue metoprolol succinate 25 mg daily. Continue verapamil 120 mg for rate control.  CMP pending.  Orders: -     Comprehensive metabolic panel  NSVT (nonsustained ventricular tachycardia) (HCC) Assessment & Plan: Controlled. Following with cardiology.  Continue metoprolol  succinate 25 mg daily, Rythmol SR to 25 mg twice daily, verapamil 120 mg daily.   Paroxysmal atrial fibrillation (HCC) Assessment & Plan: Rate and rhythm controlled.  No atrial fibrillation noted on exam. Reviewed ZIO heart monitor result from June 2024.  Continue Rythmol SR to 25 mg twice daily, verapamil 120 mg daily, metoprolol succinate 25 mg daily, aspirin 81 mg daily   Gastroesophageal reflux disease, unspecified whether esophagitis present Assessment & Plan: Overall controlled.  Continue famotidine 20 mg twice daily.   Hypothyroidism, unspecified type Assessment & Plan: She is taking levothyroxine correctly.  Continue levothyroxine 88 mcg daily. Repeat TSH pending.  Orders: -     TSH  Type 2 diabetes mellitus with other circulatory complication, without long-term current use of insulin (HCC) Assessment & Plan: Repeat A1c pending.  Reduce glipizide XL to 5 mg daily given drops of glucose readings. Continue sitagliptin-metformin 50-1000mg , 1 tablet twice daily.  Follow-up in 3 to 6 months based on A1c results.  Orders: -     Hemoglobin A1c -     glipiZIDE ER; Take 1 tablet (5 mg total) by mouth daily with breakfast. for diabetes.  Dispense: 90 tablet; Refill: 1  Chronic bilateral low back pain with right-sided sciatica Assessment & Plan: Following with physical medicine.  Continue methocarbamol375-500 milligrams at bedtime as needed, amitriptyline 50 mg at bedtime   Mixed hyperlipidemia Assessment & Plan: Repeat lipid panel pending.  Continue Zetia 10 mg daily, rosuvastatin 5 mg daily.  Orders: -     Lipid panel  Screening for colon cancer -     Ambulatory referral to Gastroenterology        Doreene Nest, NP

## 2022-12-06 NOTE — Assessment & Plan Note (Signed)
Controlled. Following with cardiology.  Continue metoprolol succinate 25 mg daily, Rythmol SR to 25 mg twice daily, verapamil 120 mg daily.

## 2022-12-06 NOTE — Assessment & Plan Note (Signed)
Repeat A1c pending.  Reduce glipizide XL to 5 mg daily given drops of glucose readings. Continue sitagliptin-metformin 50-1000mg , 1 tablet twice daily.  Follow-up in 3 to 6 months based on A1c results.

## 2022-12-06 NOTE — Assessment & Plan Note (Signed)
She is taking levothyroxine correctly. Continue levothyroxine 88 mcg daily. Repeat TSH pending. 

## 2022-12-06 NOTE — Assessment & Plan Note (Signed)
Repeat lipid panel pending.  Continue Zetia 10 mg daily, rosuvastatin 5 mg daily.

## 2022-12-07 ENCOUNTER — Encounter: Payer: Self-pay | Admitting: *Deleted

## 2022-12-07 ENCOUNTER — Other Ambulatory Visit: Payer: Self-pay | Admitting: *Deleted

## 2022-12-07 ENCOUNTER — Telehealth: Payer: Self-pay | Admitting: *Deleted

## 2022-12-07 DIAGNOSIS — Z860100 Personal history of colon polyps, unspecified: Secondary | ICD-10-CM

## 2022-12-07 MED ORDER — NA SULFATE-K SULFATE-MG SULF 17.5-3.13-1.6 GM/177ML PO SOLN: 1.0000 | Freq: Once | ORAL | 0 refills | Status: AC

## 2022-12-07 NOTE — Telephone Encounter (Signed)
Gastroenterology Pre-Procedure Review  Request Date: 01/30/2023 Requesting Physician: Dr. Tobi Bastos  PATIENT REVIEW QUESTIONS: The patient responded to the following health history questions as indicated:    1. Are you having any GI issues? no 2. Do you have a personal history of Polyps? yes (10/17/2017) 3. Do you have a family history of Colon Cancer or Polyps? no 4. Diabetes Mellitus? yes (taking glipizide, Janumet) 5. Joint replacements in the past 12 months?no 6. Major health problems in the past 3 months?no 7. Any artificial heart valves, MVP, or defibrillator?no    MEDICATIONS & ALLERGIES:    Patient reports the following regarding taking any anticoagulation/antiplatelet therapy:   Plavix, Coumadin, Eliquis, Xarelto, Lovenox, Pradaxa, Brilinta, or Effient? no Aspirin? yes (81 mg)  Patient confirms/reports the following medications:  Current Outpatient Medications  Medication Sig Dispense Refill   Accu-Chek FastClix Lancets MISC USE TO TEST BLOOD SUGAR TWICE DAILY AS DIRECTED 204 each 1   [name] 50 MG tablet TAKE 1 TABLET BY MOUTH AT BEDTIME FOR BACK PAIN 90 tablet 0   aspirin EC 81 MG tablet Take 81 mg by mouth daily. Swallow whole.     b complex vitamins capsule Take 1 capsule by mouth 2 (two) times daily.     Continuous Glucose Sensor (FREESTYLE LIBRE 3 SENSOR) MISC PLACE 1 SENSOR ON THE SKIN EVERY 14 DAYS. USE TO CHECK GLUCOSE CONTINUOUSLY 6 each 3   ezetimibe (ZETIA) 10 MG tablet TAKE 1 TABLET BY MOUTH DAILY 90 tablet 2   famotidine (PEPCID) 20 MG tablet Take 1 tablet (20 mg total) by mouth 2 (two) times daily. For heartburn. 180 tablet 1   gentamicin ointment (GARAMYCIN) 0.1 % Apply 1 application topically daily as needed. For nasal sore. 15 g 0   glipiZIDE (GLUCOTROL XL) 5 MG 24 hr tablet Take 1 tablet (5 mg total) by mouth daily with breakfast. for diabetes. 90 tablet 1   hydrochlorothiazide (HYDRODIURIL) 25 MG tablet Take 1 tablet (25 mg total) by mouth daily.  90 tablet 3   ipratropium (ATROVENT HFA) 17 MCG/ACT inhaler Inhale 2 puffs into the lungs every 4 (four) hours as needed for wheezing. 1 each 0   Iron-Vitamin C 65-125 MG TABS Take by mouth daily.      losartan (COZAAR) 50 MG tablet Take 1 tablet (50 mg total) by mouth daily. 90 tablet 3   Magnesium 500 MG CAPS Take 1 capsule by mouth 2 (two) times daily.     methocarbamol (ROBAXIN) 750 MG tablet Take 375-750 mg by mouth at bedtime as needed.     metoprolol succinate (TOPROL-XL) 25 MG 24 hr tablet Take 1 tablet (25 mg total) by mouth 2 (two) times daily. Take with or immediately following a meal. 180 tablet 2   Multiple Vitamins-Minerals (MULTIVITAMIN ADULT PO) Take 1 capsule by mouth daily.      propafenone (RYTHMOL SR) 225 MG 12 hr capsule TAKE 1 CAPSULE BY MOUTH 2 TIMES DAILY 180 capsule 0   rosuvastatin (CRESTOR) 5 MG tablet TAKE 1 TABLET BY MOUTH DAILY GENERIC EQUIVALENT FOR CRESTOR 90 tablet 3   sitaGLIPtin-metformin (JANUMET) 50-1000 MG tablet TAKE 1 TABLET TWICE A DAY WITH MEALS FOR DIABETES 180 tablet 1   SYNTHROID 88 MCG tablet Take 1 tablet by mouth every morning on an empty stomach with water only.  No food or other medications for 30 minutes. 90 tablet 0   verapamil (CALAN-SR) 120 MG CR tablet Take 1 tablet (120 mg total) by mouth daily. 90 tablet  3   No current facility-administered medications for this visit.   Facility-Administered Medications Ordered in Other Visits  Medication Dose Route Frequency Provider Last Rate Last Admin   heparin lock flush 100 unit/mL  500 Units Intracatheter Once PRN Rickard Patience, MD       sodium chloride flush (NS) 0.9 % injection 10 mL  10 mL Intracatheter PRN Rickard Patience, MD   10 mL at 10/06/17 1515    Patient confirms/reports the following allergies:  No Known Allergies  No orders of the defined types were placed in this encounter.   AUTHORIZATION INFORMATION Primary Insurance: 1D#: Group #:  Secondary Insurance: 1D#: Group #:  SCHEDULE  INFORMATION: Date: 01/30/2023 Time: Location:  [facility]

## 2022-12-08 ENCOUNTER — Telehealth: Payer: Self-pay | Admitting: Cardiovascular Disease

## 2022-12-08 NOTE — Telephone Encounter (Signed)
Left message to call back to schedule an IN OFFICE APPT FOR PRE OP CLEARANCE AND YRLY F/U.

## 2022-12-08 NOTE — Telephone Encounter (Signed)
Pre-operative Risk Assessment    Patient Name: Ahlia Scullin  DOB: Aug 17, 1955 MRN: [id number]      Request for Surgical Clearance    Procedure:   colonoscopy  Date of Surgery:  Clearance 01/30/23                                 Surgeon:  not indicated Surgeon's Group or Practice Name:  [facility] Phone number:  [phone] Fax number:  [phone]   Type of Clearance Requested:   - Medical    Type of Anesthesia:  General    Additional requests/questions:    SignedShawna Orleans   12/08/2022, 10:39 AM

## 2022-12-08 NOTE — Telephone Encounter (Signed)
Name: Debra Perez  DOB: 1956/01/27  MRN: [id number]  Primary Cardiologist: Julien Nordmann, MD  Chart reviewed as part of pre-operative protocol coverage. Because of Debra Perez's past medical history and time since last visit, she will require a follow-up in-office visit in order to better assess preoperative cardiovascular risk.  Pre-op covering staff: - Please schedule appointment and call patient to inform them. Was to see Dr. Mariah Milling in October 2024, but no appointment was made from Recall list. Please schedule with [facility] APP or Dr. Mariah Milling for pre-operative evaluation for medical clearance. Is on ASA.  - Please contact requesting surgeon's office via preferred method (i.e, phone, fax) to inform them of need for appointment prior to surgery.  Joni Reining, NP  12/08/2022, 11:52 AM

## 2022-12-09 NOTE — Telephone Encounter (Signed)
Will update all parties involved pt has appt 11/19/524 with Eula Listen, PAC.

## 2022-12-12 ENCOUNTER — Other Ambulatory Visit: Payer: Self-pay | Admitting: Cardiovascular Disease

## 2022-12-30 ENCOUNTER — Ambulatory Visit (INDEPENDENT_AMBULATORY_CARE_PROVIDER_SITE_OTHER): Payer: Managed Care, Other (non HMO) | Admitting: Pulmonary Disease

## 2022-12-30 ENCOUNTER — Encounter: Payer: Self-pay | Admitting: Pulmonary Disease

## 2022-12-30 VITALS — BP 124/75 | HR 64 | Ht 66.0 in | Wt 174.4 lb

## 2022-12-30 DIAGNOSIS — G4733 Obstructive sleep apnea (adult) (pediatric): Secondary | ICD-10-CM | POA: Diagnosis not present

## 2022-12-30 NOTE — Patient Instructions (Signed)
Download from the machine shows excellent compliance -The machine is treating her CPAP well  I will see you a year from now  Call us with significant concerns

## 2022-12-30 NOTE — Progress Notes (Signed)
Debra Perez    [id number]    1955/07/05  Primary Care Physician:Clark, Keane Scrape, NP  Referring Physician: Doreene Nest, NP [address] [place],  [place] [address]  Chief complaint:   Patient with a history of obstructive sleep apnea In for follow-up today  HPI:  History of obstructive sleep apnea Remains compliant with CPAP use  No significant symptoms No significant complaints  Wakes up feeling like she got a good nights rest Continues to stay active and functioning well  Sleep apnea was diagnosed when she was out in [place] She is unsure how severe the sleep apnea is for a current machine settings  Usually goes to bed about 1030 takes about 5 to 10 minutes to fall asleep 1-2 awakenings final wake up time between 630 and 7 Weight has been relatively stable  No significant concerning daytime symptoms  History of hypertension, atrial fibrillation once in the past, hypercholesterolemia  reformed smoker, quit in 2007  No history of lung disease No family history of obstructive sleep apnea Spouse has sleep apnea, on treatment  Outpatient Encounter Medications as of 12/30/2022  Medication Sig   Accu-Chek FastClix Lancets MISC USE TO TEST BLOOD SUGAR TWICE DAILY AS DIRECTED   [name] 50 MG tablet TAKE 1 TABLET BY MOUTH AT BEDTIME FOR BACK PAIN   aspirin EC 81 MG tablet Take 81 mg by mouth daily. Swallow whole.   b complex vitamins capsule Take 1 capsule by mouth 2 (two) times daily.   Continuous Glucose Sensor (FREESTYLE LIBRE 3 SENSOR) MISC PLACE 1 SENSOR ON THE SKIN EVERY 14 DAYS. USE TO CHECK GLUCOSE CONTINUOUSLY   ezetimibe (ZETIA) 10 MG tablet TAKE 1 TABLET BY MOUTH DAILY   famotidine (PEPCID) 20 MG tablet Take 1 tablet (20 mg total) by mouth 2 (two) times daily. For heartburn.   gentamicin ointment (GARAMYCIN) 0.1 % Apply 1 application topically daily as needed. For nasal sore.   glipiZIDE (GLUCOTROL XL) 5 MG 24 hr tablet  Take 1 tablet (5 mg total) by mouth daily with breakfast. for diabetes.   hydrochlorothiazide (HYDRODIURIL) 25 MG tablet Take 1 tablet (25 mg total) by mouth daily.   ipratropium (ATROVENT HFA) 17 MCG/ACT inhaler Inhale 2 puffs into the lungs every 4 (four) hours as needed for wheezing.   Iron-Vitamin C 65-125 MG TABS Take by mouth daily.    losartan (COZAAR) 50 MG tablet Take 1 tablet (50 mg total) by mouth daily.   Magnesium 500 MG CAPS Take 1 capsule by mouth 2 (two) times daily.   methocarbamol (ROBAXIN) 750 MG tablet Take 375-750 mg by mouth at bedtime as needed.   metoprolol succinate (TOPROL-XL) 25 MG 24 hr tablet Take 1 tablet (25 mg total) by mouth 2 (two) times daily. Take with or immediately following a meal.   Multiple Vitamins-Minerals (MULTIVITAMIN ADULT PO) Take 1 capsule by mouth daily.    propafenone (RYTHMOL SR) 225 MG 12 hr capsule TAKE 1 CAPSULE BY MOUTH 2 TIMES DAILY   rosuvastatin (CRESTOR) 5 MG tablet TAKE 1 TABLET BY MOUTH DAILY GENERIC EQUIVALENT FOR CRESTOR   sitaGLIPtin-metformin (JANUMET) 50-1000 MG tablet TAKE 1 TABLET TWICE A DAY WITH MEALS FOR DIABETES   SYNTHROID 88 MCG tablet Take 1 tablet by mouth every morning on an empty stomach with water only.  No food or other medications for 30 minutes.   verapamil (CALAN-SR) 120 MG CR tablet Take 1 tablet (120 mg total) by  mouth daily.   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  On Eliquis; b. CHADS2VASc => 3 (HTN, DM, female)   Skin ulcer of left ankle, limited to breakdown of skin (HCC) 04/02/2020   Sleep apnea     Past Surgical History:  Procedure Laterality Date   ABDOMINAL HYSTERECTOMY  03/07/2000   ovaries remain   CLAVICLE SURGERY  03/07/2005   right clavicle plate pins   COLONOSCOPY WITH PROPOFOL N/A 10/17/2017   Procedure: COLONOSCOPY WITH PROPOFOL;  Surgeon: [name], MD;  Location: [facility] ENDOSCOPY;  Service: Gastroenterology;  Laterality: N/A;   ESOPHAGOGASTRODUODENOSCOPY (EGD) WITH PROPOFOL N/A 10/17/2017   Procedure: ESOPHAGOGASTRODUODENOSCOPY (EGD) WITH PROPOFOL;  Surgeon: [name], MD;  Location: [facility] ENDOSCOPY;  Service: Gastroenterology;  Laterality: N/A;   GIVENS CAPSULE STUDY N/A 11/30/2017   Procedure: GIVENS CAPSULE STUDY;  Surgeon: [name], MD;  Location: [facility] ENDOSCOPY;  Service: Gastroenterology;  Laterality: N/A;   KNEE ARTHROSCOPY  03/07/2010   Left foot surgery Left 03/03/2022    Family History  Problem Relation Age of Onset   Depression Mother    Sudden death Mother 32   Heart attack Mother    Arthritis Father    Heart disease Father    Hypertension Father    Emphysema Father    Bladder Cancer Sister    Diabetes Sister        Type 2   Diabetes Brother        type   Hypertension Brother    Emphysema Brother    Heart disease Brother    Diabetes Maternal Grandfather    Bladder Cancer Maternal Grandmother        sinus cancer    Breast cancer Paternal Grandmother    Cancer Paternal Grandfather        colon cancer   Diabetes Paternal Grandfather     Social History   Socioeconomic History   Marital status: Married    Spouse name: Not on file   Number of children: Not on file   Years of education: Not on file   Highest education level: Not on file  Occupational History    Occupation: retired  Tobacco Use   Smoking status: Former    Current packs/day: 0.00    Average packs/day: 1 pack/day for 20.0 years (20.0 ttl pk-yrs)    Types: Cigarettes    Start date: 03/07/1984    Quit date: 03/07/2004    Years since quitting: 18.8   Smokeless tobacco: Never  Vaping Use   Vaping status: Never Used  Substance and Sexual Activity   Alcohol use: No    Alcohol/week: 0.0 standard drinks of alcohol   Drug use: No  Sexual activity: Not on file  Other Topics Concern   Not on file  Social History Narrative   Married.   1 daughter.   Moved here from [place] due to husbands occupation.   She is a Public house manager.   Enjoys sewing, gardening.   Social Determinants of Health   Financial Resource Strain: Not on file  Food Insecurity: Not on file  Transportation Needs: Not on file  Physical Activity: Not on file  Stress: Not on file  Social Connections: Not on file  Intimate Partner Violence: Not on file    Review of Systems  Constitutional:  Negative for unexpected weight change.  Respiratory:  Positive for apnea. Negative for shortness of breath.   Psychiatric/Behavioral:  Positive for sleep disturbance.     Vitals:   12/30/22 1308  BP: 124/75  Pulse: 64  SpO2: 94%     Physical Exam Constitutional:      Appearance: Normal appearance.  HENT:     Head: Normocephalic.  Eyes:     General:        Right eye: No discharge.        Left eye: No discharge.     Pupils: Pupils are equal, round, and reactive to light.  Cardiovascular:     Rate and Rhythm: Normal rate.     Heart sounds: No murmur heard.    No friction rub.  Pulmonary:     Effort: No respiratory distress.     Breath sounds: No stridor. No wheezing or rhonchi.  Musculoskeletal:     Cervical back: No rigidity or tenderness.  Neurological:     Mental Status: She is alert.  Psychiatric:        Mood and Affect: Mood normal.     CPAP compliance shows 90% compliance Average use of 9 hours 7  minutes AutoSet 7-15 Residual AHI of 0.3  Assessment:  History of obstructive sleep apnea -Remains compliant with CPAP -Download shows effective treatment     Plan/Recommendations: Continue CPAP  Call with significant concerns  Follow-up a year from now  Virl Diamond MD [facility] 12/30/2022, 1:13 PM  CC: Doreene Nest, NP

## 2023-01-03 ENCOUNTER — Other Ambulatory Visit: Payer: Self-pay | Admitting: Primary Care

## 2023-01-03 DIAGNOSIS — E1159 Type 2 diabetes mellitus with other circulatory complications: Secondary | ICD-10-CM

## 2023-01-16 ENCOUNTER — Encounter: Payer: Self-pay | Admitting: Oncology

## 2023-01-22 ENCOUNTER — Encounter: Payer: Self-pay | Admitting: Oncology

## 2023-01-23 ENCOUNTER — Other Ambulatory Visit: Payer: Self-pay

## 2023-01-23 DIAGNOSIS — E039 Hypothyroidism, unspecified: Secondary | ICD-10-CM

## 2023-01-23 MED ORDER — SYNTHROID 88 MCG PO TABS
ORAL_TABLET | ORAL | 2 refills | Status: DC
Start: 2023-01-23 — End: 2023-10-04

## 2023-01-24 ENCOUNTER — Telehealth: Payer: Self-pay | Admitting: Primary Care

## 2023-01-24 ENCOUNTER — Encounter: Payer: Self-pay | Admitting: Physician Assistant

## 2023-01-24 ENCOUNTER — Ambulatory Visit: Payer: Managed Care, Other (non HMO) | Attending: Physician Assistant | Admitting: Physician Assistant

## 2023-01-24 ENCOUNTER — Other Ambulatory Visit: Payer: Self-pay | Admitting: Primary Care

## 2023-01-24 VITALS — BP 148/88 | HR 60 | Ht 66.0 in | Wt 172.8 lb

## 2023-01-24 DIAGNOSIS — R002 Palpitations: Secondary | ICD-10-CM | POA: Diagnosis not present

## 2023-01-24 DIAGNOSIS — I77819 Aortic ectasia, unspecified site: Secondary | ICD-10-CM

## 2023-01-24 DIAGNOSIS — I471 Supraventricular tachycardia, unspecified: Secondary | ICD-10-CM | POA: Diagnosis not present

## 2023-01-24 DIAGNOSIS — I4729 Other ventricular tachycardia: Secondary | ICD-10-CM | POA: Diagnosis not present

## 2023-01-24 DIAGNOSIS — I4891 Unspecified atrial fibrillation: Secondary | ICD-10-CM

## 2023-01-24 DIAGNOSIS — E785 Hyperlipidemia, unspecified: Secondary | ICD-10-CM

## 2023-01-24 DIAGNOSIS — I493 Ventricular premature depolarization: Secondary | ICD-10-CM

## 2023-01-24 DIAGNOSIS — G8929 Other chronic pain: Secondary | ICD-10-CM

## 2023-01-24 DIAGNOSIS — I2510 Atherosclerotic heart disease of native coronary artery without angina pectoris: Secondary | ICD-10-CM

## 2023-01-24 DIAGNOSIS — I10 Essential (primary) hypertension: Secondary | ICD-10-CM

## 2023-01-24 DIAGNOSIS — I340 Nonrheumatic mitral (valve) insufficiency: Secondary | ICD-10-CM

## 2023-01-24 DIAGNOSIS — Z01810 Encounter for preprocedural cardiovascular examination: Secondary | ICD-10-CM

## 2023-01-24 MED ORDER — METOPROLOL TARTRATE 25 MG PO TABS: ORAL_TABLET | ORAL | Status: AC

## 2023-01-24 NOTE — Progress Notes (Signed)
Cardiology Office Note    Date:  01/24/2023   ID:  Debra Perez, DOB 1955/09/30, MRN [id number]  PCP:  Doreene Nest, NP  Cardiologist:  Julien Nordmann, MD  Electrophysiologist:  None   Chief Complaint: Follow up  History of Present Illness:   Debra Perez is a 67 y.o. 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   Remote stress testing from 2005 in [place] showed no significant ischemia or prior MI, normal LV size and function with an EF of 80%, adequate exercise tolerance, good blood pressure and heart rate response to exercise.  Normal study.  Cardiac CT from 2016 showed an EF of 73%, ectatic ascending aorta measuring 3.72 cm in caliber, normal caliber of the descending segment, three-vessel CAD with mild multifocal calcific narrowing of the proximal LAD and proximal ramus intermedius.  Mild calcific narrowing in the proximal RCA with minimal narrowing in the middle third.  No critical or flow-limiting disease was identified.  Echo from 2016 showed an EF of 60 to 65%, mild concentric LVH, diastolic dysfunction, left atrium upper limits of normal, mild posterior MVP, trace mitral regurgitation, RVSP 27 mmHg, no pericardial effusion.  Patient has previously attributed her A. fib to a URI while on steroids and albuterol.  She has previously not tolerated scheduled beta-blockers secondary to fatigue and reported weight gain.  Multaq led to hair loss.  Echo in 03/2018 showed an EF of 60 to 65%, diastolic dysfunction, normal size left atrium, normal size right atrium, no significant valvular abnormalities, aortic  root 2.8 cm, ascending aorta 3.2 cm, aortic arch 2.8 cm.  Outpatient cardiac monitoring showed an overall sinus rhythm with an average heart rate of 71 bpm.  There were 6 SVT/atrial tachycardic runs with the fastest interval lasting 5 beats with a maximum rate of 167 bpm and the longest interval lasting 14 beats with an average rate of 127 bpm.  Isolated PACs, atrial couplets, and atrial triplets were rare.  Isolated PVCs were rare.  Patient triggered events were either not associated with significant arrhythmia or with clusters of PACs.  CTA aorta on 08/17/2018 that showed an essentially unchanged thoracic aorta measuring 3.8 x 3.6 cm with minimal aortic atherosclerosis and coronary artery calcification.    She was seen in our office in 07/2022 reporting an approximate 4-week history of randomly occurring heart rates from the 30s to 140s bpm with increased fatigue, dizziness, and dyspnea at times with symptoms lasting from several seconds to episodes of tachypalpitations that would persist all day long.  Strips reviewed at that time showed sinus bradycardia, and at times frequent PVCs in a pattern of bigeminy as well as intermittent idioventricular rhythm.  She was taking Toprol-XL 25 mg twice daily, propafenone 225 mg twice daily, and verapamil 240 mg daily.  It was recommended she decrease verapamil to 120 mg daily with continuation of Toprol and propafenone.  Subsequent Zio patch demonstrated a predominant rhythm of sinus with an average rate of 64 bpm with a range of 49 to 146 bpm, 5 episodes of SVT occurred with the fastest interval lasting 6 beats with a maximum rate of 146 bpm and the longest  interval lasting 7 beats with an average rate of 121 bpm, rare PACs, atrial couplets and triplets, PVCs, and ventricular couplets.  Seven patient triggered events occurred with sinus rhythm.  She was last seen in the office in 08/2022 and felt better on lower dose of verapamil with no further significant bradycardic  rates.  She had developed a cough with lisinopril which was discontinued with initiation of losartan.  She comes in doing well from a cardiac perspective and is without symptoms of angina or cardiac decompensation.  She does note an increase in palpitations consistent with PVCs.  She continues to do well on lower dose of verapamil without any further dizziness and improved functional status/energy.  No significant tachycardic rates at home.  Remains very active at baseline without cardiac limitation.  No presyncope or syncope.  Concerned about mitral valve with prior history of MVP.  Blood pressure is mildly elevated in the office today, though she just took her medications 30 minutes prior to her appointment.  Blood pressure typically well-controlled at home and in the office.  She is scheduled to undergo colonoscopy on 01/30/2023.  [facility] Activity Status Index: > 4 METs Revised Cardiac Risk Index: Class I    Labs independently reviewed: 12/2022 - TSH normal, potassium 4.5, BUN 10, serum creatinine 0.84, albumin 4.0, AST/ALT normal, A1c 7.0, TC 117, TG 107, HDL 49, LDL 46 07/2022 - Hgb 13.0, PLT 258  Past Medical History:  Diagnosis Date   Carotid artery occlusion    20 % left side   Diabetes mellitus without complication (HCC)    Diverticulosis    GERD (gastroesophageal reflux disease)    Gross hematuria 02/02/2022   History of colonic polyps    benign   Hyperlipidemia    Hypertension    Hypothyroidism    Lung infection    Mitral valve prolapse    NSVT (nonsustained ventricular tachycardia) (HCC)    Obstructive sleep apnea    on CPAP   PAF (paroxysmal atrial fibrillation) (HCC)    a.  On Eliquis; b. CHADS2VASc => 3 (HTN, DM, female)   Skin ulcer of left ankle, limited to breakdown of skin (HCC) 04/02/2020   Sleep apnea     Past Surgical History:  Procedure Laterality Date   ABDOMINAL HYSTERECTOMY  03/07/2000   ovaries remain   CLAVICLE SURGERY  03/07/2005   right clavicle  plate pins   COLONOSCOPY WITH PROPOFOL N/A 10/17/2017   Procedure: COLONOSCOPY WITH PROPOFOL;  Surgeon: [name], MD;  Location: [facility] ENDOSCOPY;  Service: Gastroenterology;  Laterality: N/A;   ESOPHAGOGASTRODUODENOSCOPY (EGD) WITH PROPOFOL N/A 10/17/2017   Procedure: ESOPHAGOGASTRODUODENOSCOPY (EGD) WITH PROPOFOL;  Surgeon: [name], MD;  Location: [facility] ENDOSCOPY;  Service: Gastroenterology;  Laterality: N/A;   GIVENS CAPSULE STUDY N/A 11/30/2017   Procedure: GIVENS CAPSULE STUDY;  Surgeon: [name], MD;  Location: [facility] ENDOSCOPY;  Service: Gastroenterology;  Laterality: N/A;   KNEE ARTHROSCOPY  03/07/2010   Left foot surgery Left 03/03/2022    Current Medications: Current Meds  Medication Sig   Accu-Chek FastClix Lancets MISC USE TO TEST BLOOD SUGAR TWICE DAILY AS DIRECTED   [name] 50 MG tablet TAKE 1 TABLET BY MOUTH AT BEDTIME FOR BACK PAIN   aspirin EC 81 MG tablet Take 81 mg by mouth daily. Swallow whole.   b complex vitamins capsule Take 1 capsule by mouth 2 (two) times daily.   Continuous Glucose Sensor (FREESTYLE LIBRE 3 SENSOR) MISC PLACE 1 SENSOR ON THE SKIN EVERY 14 DAYS.  USE TO CHECK GLUCOSE CONTINUOUSLY   ezetimibe (ZETIA) 10 MG tablet TAKE 1 TABLET BY MOUTH DAILY   famotidine (PEPCID) 20 MG tablet Take 1 tablet (20 mg total) by mouth 2 (two) times daily. For heartburn.   gentamicin ointment (GARAMYCIN) 0.1 % Apply 1 application topically daily as needed. For nasal sore.   glipiZIDE (GLUCOTROL XL) 5 MG 24 hr tablet Take 1 tablet (5 mg total) by mouth daily with breakfast. for diabetes.   hydrochlorothiazide (HYDRODIURIL) 25 MG tablet Take 1 tablet (25 mg total) by mouth daily.   ipratropium (ATROVENT HFA) 17 MCG/ACT inhaler Inhale 2 puffs into the lungs every 4 (four) hours as needed for wheezing.   Iron-Vitamin C 65-125 MG TABS Take by mouth daily.    losartan (COZAAR) 50 MG tablet Take 1 tablet (50 mg total) by mouth daily.   Magnesium 500 MG CAPS  Take 1 capsule by mouth 2 (two) times daily.   methocarbamol (ROBAXIN) 750 MG tablet Take 375-750 mg by mouth at bedtime as needed.   metoprolol succinate (TOPROL-XL) 25 MG 24 hr tablet Take 1 tablet (25 mg total) by mouth 2 (two) times daily. Take with or immediately following a meal.   metoprolol tartrate (LOPRESSOR) 25 MG tablet Take 0.5 tablet (12.5 mg) by mouth once daily as needed for palpitations   Multiple Vitamins-Minerals (MULTIVITAMIN ADULT PO) Take 1 capsule by mouth daily.    propafenone (RYTHMOL SR) 225 MG 12 hr capsule TAKE 1 CAPSULE BY MOUTH 2 TIMES DAILY   rosuvastatin (CRESTOR) 5 MG tablet TAKE 1 TABLET BY MOUTH DAILY GENERIC EQUIVALENT FOR CRESTOR   sitaGLIPtin-metformin (JANUMET) 50-1000 MG tablet TAKE 1 TABLET TWICE A DAY WITH MEALS FOR DIABETES   SYNTHROID 88 MCG tablet Take 1 tablet by mouth every morning on an empty stomach with water only.  No food or other medications for 30 minutes.   verapamil (CALAN-SR) 120 MG CR tablet Take 1 tablet (120 mg total) by mouth daily.    Allergies:   Patient has no known allergies.   Social History   Socioeconomic History   Marital status: Married    Spouse name: Not on file   Number of children: Not on file   Years of education: Not on file   Highest education level: Not on file  Occupational History   Occupation: retired  Tobacco Use   Smoking status: Former    Current packs/day: 0.00    Average packs/day: 1 pack/day for 20.0 years (20.0 ttl pk-yrs)    Types: Cigarettes    Start date: 03/07/1984    Quit date: 03/07/2004    Years since quitting: 18.8   Smokeless tobacco: Never  Vaping Use   Vaping status: Never Used  Substance and Sexual Activity   Alcohol use: No    Alcohol/week: 0.0 standard drinks of alcohol   Drug use: No   Sexual activity: Not on file  Other Topics Concern   Not on file  Social History Narrative   Married.   1 daughter.   Moved here from [place] due to husbands occupation.   She is a Public house manager.    Enjoys sewing, gardening.   Social Determinants of Health   Financial Resource Strain: Not on file  Food Insecurity: Not on file  Transportation Needs: Not on file  Physical Activity: Not on file  Stress: Not on file  Social Connections: Not on file     Family History:  The patient's family history includes Arthritis in her father; Bladder  Cancer in her maternal grandmother and sister; Breast cancer in her paternal grandmother; Cancer in her paternal grandfather; Depression in her mother; Diabetes in her brother, maternal grandfather, paternal grandfather, and sister; Emphysema in her brother and father; Heart attack in her mother; Heart disease in her brother and father; Hypertension in her brother and father; Sudden death (age of onset: 79) in her mother.  ROS:   12-point review of systems is negative unless otherwise noted in the HPI.   EKGs/Labs/Other Studies Reviewed:    Studies reviewed were summarized above. The additional studies were reviewed today:  Zio patch 07/2022: Normal sinus rhythm Patient had a min HR of 49 bpm, max HR of 146 bpm, and avg HR of 64 bpm.    5 Supraventricular Tachycardia runs occurred, the run with the fastest interval lasting 6 beats with a max rate of 146 bpm, the longest lasting 7 beats with an avg rate of 121 bpm.    Isolated SVEs were rare (<1.0%), SVE Couplets were rare (<1.0%), and SVE Triplets were rare (<1.0%).  Isolated VEs were rare (<1.0%), VE Couplets were rare (<1.0%), and no VE Triplets were present.   Patient triggered events (7) associated with normal sinus rhythm __________   2D echo 04/06/2018: 1. The left ventricle has normal systolic function of 60-65%. The cavity  size is normal. There is no left ventricular wall thickness. Echo evidence  of impaired relaxation diastolic filling patterns.   2. Normal left atrial size.   3. Normal right atrial size.   4. Normal tricuspid valve.   5. There is normal right ventricular systolic  function. b Right  ventricular systolic pressure could not be assessed.  __________   Luci [place] patch 03/2018: Normal sinus rhythm Average Heart rate: 71 bpm.    6 Supraventricular Tachycardia/atrial tachyardia runs occurred, the run with the fastest interval lasting 5 beats with a max rate of 167 bpm,  the longest lasting 14 beats with an avg rate of 127 bpm.    Isolated SVEs were rare (<1.0%), SVE Couplets were rare (<1.0%), and SVE Triplets were rare (<1.0%).  Isolated VEs were rare (<1.0%), and no VE Couplets or VE Triplets were present.   Patient triggered events were either not associated with significant arrhythmia, or with clusters of APCs.   EKG:  EKG is ordered today.  The EKG ordered today demonstrates NSR, 60 bpm, incomplete RBBB, consistent with prior tracing  Recent Labs: 07/18/2022: Hemoglobin 13.0; Magnesium 1.9; Platelets 258 12/06/2022: ALT 19; BUN 10; Creatinine, Ser 0.84; Potassium 4.5; Sodium 136; TSH 1.30  Recent Lipid Panel    Component Value Date/Time   CHOL 117 12/06/2022 0910   TRIG 107.0 12/06/2022 0910   HDL 49.80 12/06/2022 0910   CHOLHDL 2 12/06/2022 0910   VLDL 21.4 12/06/2022 0910   LDLCALC 46 12/06/2022 0910    PHYSICAL EXAM:    VS:  BP (!) 148/88 (BP Location: Left Arm, Patient Position: Supine, Cuff Size: Normal)   Pulse 60   Ht 5\' 6"  (1.676 m)   Wt 172 lb 12.8 oz (78.4 kg)   SpO2 97%   BMI 27.89 kg/m   BMI: Body mass index is 27.89 kg/m.  Physical Exam Vitals reviewed.  Constitutional:      Appearance: She is well-developed.  HENT:     Head: Normocephalic and atraumatic.  Eyes:     General:        Right eye: No discharge.        Left eye: No  discharge.  Neck:     Vascular: No JVD.  Cardiovascular:     Rate and Rhythm: Normal rate and regular rhythm. Occasional Extrasystoles are present.    Heart sounds: Normal heart sounds, S1 normal and S2 normal. Heart sounds not distant. No midsystolic click and no opening snap. No murmur  heard.    No friction rub.  Pulmonary:     Effort: Pulmonary effort is normal. No respiratory distress.     Breath sounds: Normal breath sounds. No decreased breath sounds, wheezing, rhonchi or rales.  Chest:     Chest wall: No tenderness.  Abdominal:     General: There is no distension.  Musculoskeletal:     Cervical back: Normal range of motion.  Skin:    General: Skin is warm and dry.     Nails: There is no clubbing.  Neurological:     Mental Status: She is alert and oriented to person, place, and time.  Psychiatric:        Speech: Speech normal.        Behavior: Behavior normal.        Thought Content: Thought content normal.        Judgment: Judgment normal.     Wt Readings from Last 3 Encounters:  01/24/23 172 lb 12.8 oz (78.4 kg)  12/30/22 174 lb 6.4 oz (79.1 kg)  12/06/22 173 lb (78.5 kg)     ASSESSMENT & PLAN:   Palpitations with idioventricular rhythm/PSVT/PVCs/NSVT: Rhythm strip from Apple watch shows sinus bradycardia with occasional PVC's.  Recent Zio patch showed a predominant rhythm of sinus with 5 runs of nonsustained SVT lasting up to 7 beats along with rare atrial and ventricular ectopy.  Continue metoprolol 25 mg daily, propafenone to 25 mg twice daily, and verapamil 120 mg daily.  Could use as needed Lopressor 12.5 mg daily as needed for sustained tachypalpitations, though would caution on regular use in an effort to prevent significant bradycardia.  CAD involving the native coronary arteries without angina: She is without symptoms of angina or cardiac decompensation.  Continue aggressive risk factor modification and primary prevention including aspirin, rosuvastatin, ezetimibe, and Toprol-XL.  No indication for further ischemic testing at this time.  PAF: Lone episode in [place] during acute illness.  Primary cardiologist noted as indicated the patient previously requested to stop apixaban and prefers to remain off OAC.  Recent Zio patch that showed no  evidence of recurrence.  She remains on Toprol-XL, propafenone, and verapamil as outlined above.  Mitral regurgitation: Prior echo at an outside institution showed MVP of the posterior mitral leaflet with trace regurgitation.  Echo in 2020 showed normal structure and function of the mitral valve without evidence of regurgitation.  Update echo.  Ectatic ascending aorta: No evidence of aneurysm on most recent CTA.  HTN: Blood pressure is mildly elevated in the office today, though she just took her antihypertensive therapy.  Remains on HCTZ 25 mg, losartan 50 mg, Toprol-XL 25 mg, and verapamil 120 mg.  Given blood pressure is typically well-controlled we deferred medication changes at this time.  HLD: LDL 46 in 12/2022 with normal AST/ALT at that time.  She remains on rosuvastatin 5 mg and ezetimibe 10 mg.  Preprocedure cardiac risk stratification: Patient is scheduled to undergo colonoscopy on 01/30/2023.  Per [facility] Activity Status Index she can achieve > 4 METs.  Per Revised Cardiac Risk Index, she is class I risk with an estimated rate of 3.9% for adverse cardiac event in the periprocedural  timeframe.  She may proceed with low risk noncardiac procedure at an overall class I risk without further cardiac testing.    Disposition: F/u with Dr. Mariah Milling or an APP in 2 months.   Medication Adjustments/Labs and Tests Ordered: Current medicines are reviewed at length with the patient today.  Concerns regarding medicines are outlined above. Medication changes, Labs and Tests ordered today are summarized above and listed in the Patient Instructions accessible in Encounters.   SignedEula Listen, PA-C 01/24/2023 11:24 AM     [facility] [address] [place], [place] [address] [phone]

## 2023-01-24 NOTE — Patient Instructions (Signed)
Medication Instructions:  - Your physician has recommended you make the following change in your medication:   1) START Metoprolol tartrate 25 mg: - take 0.5 tablet (12.5 mg) by mouth once daily as needed for palpitations   *If you need a refill on your cardiac medications before your next appointment, please call your pharmacy*   Lab Work: - none ordered  If you have labs (blood work) drawn today and your tests are completely normal, you will receive your results only by: MyChart Message (if you have MyChart) OR A paper copy in the mail If you have any lab test that is abnormal or we need to change your treatment, we will call you to review the results.   Testing/Procedures:  1) Echocardiogram: - Your physician has requested that you have an echocardiogram. Echocardiography is a painless test that uses sound waves to create images of your heart. It provides your doctor with information about the size and shape of your heart and how well your heart's chambers and valves are working. This procedure takes approximately one hour. There are no restrictions for this procedure. There is a possibility that an IV may need to be started during your test to inject an image enhancing agent. This is done to obtain more optimal pictures of your heart. Therefore we ask that you do at least drink some water prior to coming in to hydrate your veins.   Please do NOT wear cologne, perfume, aftershave, or lotions (deodorant is allowed). Please arrive 15 minutes prior to your appointment time.  Please note: We ask at that you not bring children with you during ultrasound (echo/ vascular) testing. Due to room size and safety concerns, children are not allowed in the ultrasound rooms during exams. Our front office staff cannot provide observation of children in our lobby area while testing is being conducted. An adult accompanying a patient to their appointment will only be allowed in the ultrasound room at the  discretion of the ultrasound technician under special circumstances. We apologize for any inconvenience.    Follow-Up: At [facility], you and your health needs are our priority.  As part of our continuing mission to provide you with exceptional heart care, we have created designated Provider Care Teams.  These Care Teams include your primary Cardiologist (physician) and Advanced Practice Providers (APPs -  Physician Assistants and Nurse Practitioners) who all work together to provide you with the care you need, when you need it.  We recommend signing up for the patient portal called "MyChart".  Sign up information is provided on this After Visit Summary.  MyChart is used to connect with patients for Virtual Visits (Telemedicine).  Patients are able to view lab/test results, encounter notes, upcoming appointments, etc.  Non-urgent messages can be sent to your provider as well.   To learn more about what you can do with MyChart, go to [email or web address].    Your next appointment:   2 month(s)  Provider:   You may see Julien Nordmann, MD or one of the following Advanced Practice Providers on your designated Care Team:   Nicolasa Ducking, NP Eula Listen, PA-C   Other Instructions Echocardiogram An echocardiogram is a test that uses sound waves to make images of your heart. This way of making images is often called ultrasound. The images from this test can help find out many things about your heart, including: The size and shape of your heart. The strength of your heart muscle and how well it's working.  The size, thickness, and movement of your heart's walls. How your heart valves are working. Problems such as: A tumor or a growth from an infection around the heart valves. Areas of heart muscle that aren't working well because of poor blood flow or injury from a heart attack. An aneurysm. This is a weak or damaged part of an artery wall. An artery is a blood vessel. Tell a  health care provider about: Any allergies you have. All medicines you're taking, including vitamins, herbs, eye drops, creams, and over-the-counter medicines. Any bleeding problems you have. Any surgeries you've had. Any medical problems you have. Whether you're pregnant or may be pregnant. What are the risks? Your health care provider will talk with you about risks. These may include an allergic reaction to IV dye that may be used during the test. What happens before the test? You don't need to do anything to get ready for this test. You may eat and drink normally. What happens during the test?  You'll take off your clothes from the waist up and put on a hospital gown. Sticky patches called electrodes may be placed on your chest. These will be connected to a machine that monitors your heart rate and rhythm. You'll lie down on a table for the exam. A wand covered in gel will be moved over your chest. Sound waves from the wand will go to your heart and bounce back--or "echo" back. The sound waves will go to a computer that uses them to make images of your heart. The images can be viewed on a monitor. The images will also be recorded on the computer so your provider can look at them later. You may be asked to change positions or hold your breath for a short time. This makes it easier to get different views or better views of your heart. In some cases, you may be given a dye through an IV. The IV is put into one of your veins. This dye can make the areas of your heart easier to see. The procedure may vary among providers and hospitals. What can I expect after the test? You may return to your normal diet, activities, and medicines unless your provider tells you not to. If an IV was placed for the test, it will be removed. It's up to you to get the results of your test. Ask your provider, or the department that's doing the test, when your results will be ready. This information is not intended to  replace advice given to you by your health care provider. Make sure you discuss any questions you have with your health care provider. Document Revised: 04/22/2022 Document Reviewed: 04/22/2022 Elsevier Patient Education  2024 [employer].

## 2023-01-24 NOTE — Telephone Encounter (Signed)
[facility] called stating brand name  SYNTHROID 88 MCG tablet is not covered under insurance,however generic brand of L-Thyroxine is.They would like permission to dispense this brand.  HYQ#:[id number]

## 2023-01-25 ENCOUNTER — Telehealth: Payer: Self-pay | Admitting: Primary Care

## 2023-01-25 NOTE — Telephone Encounter (Signed)
See other telephone encounter.

## 2023-01-25 NOTE — Telephone Encounter (Signed)
Called and spoke with [facility], gave approval of Synthroid to be generic.

## 2023-01-25 NOTE — Telephone Encounter (Signed)
Annabelle Harman from [facility] called stating that patients insurance does not cover brand name of SYNTHROID 88 MCG tablet ,and would like permission to dispense generic name L-Thyroxine.   Ref #: [id number]

## 2023-01-25 NOTE — Telephone Encounter (Signed)
Permission granted to switch to generic.

## 2023-01-26 ENCOUNTER — Telehealth: Payer: Self-pay | Admitting: *Deleted

## 2023-01-26 NOTE — Telephone Encounter (Signed)
Preprocedure cardiac risk stratification: Patient is scheduled to undergo colonoscopy on 01/30/2023.  Per [facility] Activity Status Index she can achieve > 4 METs.  Per Revised Cardiac Risk Index, she is class I risk with an estimated rate of 3.9% for adverse cardiac event in the periprocedural timeframe.  She may proceed with low risk noncardiac procedure at an overall class I risk without further cardiac testing.  Patient is aware of clearance.

## 2023-01-26 NOTE — Telephone Encounter (Signed)
It shows that patient saw cardiology on 01/24/2023.  Cardiology Office Note     Date:  01/24/2023    ID:  Debra Perez, DOB 1955/09/29, MRN [id number]   PCP:  Doreene Nest, NP  Cardiologist:  Julien Nordmann, MD  Electrophysiologist:  None   ASSESSMENT & PLAN:    Palpitations with idioventricular rhythm/PSVT/PVCs/NSVT: Rhythm strip from Apple watch shows sinus bradycardia with occasional PVC's.  Recent Zio patch showed a predominant rhythm of sinus with 5 runs of nonsustained SVT lasting up to 7 beats along with rare atrial and ventricular ectopy.  Continue metoprolol 25 mg daily, propafenone to 25 mg twice daily, and verapamil 120 mg daily.  Could use as needed Lopressor 12.5 mg daily as needed for sustained tachypalpitations, though would caution on regular use in an effort to prevent significant bradycardia.   CAD involving the native coronary arteries without angina: She is without symptoms of angina or cardiac decompensation.  Continue aggressive risk factor modification and primary prevention including aspirin, rosuvastatin, ezetimibe, and Toprol-XL.  No indication for further ischemic testing at this time.   PAF: Lone episode in [place] during acute illness.  Primary cardiologist noted as indicated the patient previously requested to stop apixaban and prefers to remain off OAC.  Recent Zio patch that showed no evidence of recurrence.  She remains on Toprol-XL, propafenone, and verapamil as outlined above.   Mitral regurgitation: Prior echo at an outside institution showed MVP of the posterior mitral leaflet with trace regurgitation.  Echo in 2020 showed normal structure and function of the mitral valve without evidence of regurgitation.  Update echo.   Ectatic ascending aorta: No evidence of aneurysm on most recent CTA.   HTN: Blood pressure is mildly elevated in the office today, though she just took her antihypertensive therapy.  Remains on HCTZ 25 mg, losartan 50 mg, Toprol-XL  25 mg, and verapamil 120 mg.  Given blood pressure is typically well-controlled we deferred medication changes at this time.   HLD: LDL 46 in 12/2022 with normal AST/ALT at that time.  She remains on rosuvastatin 5 mg and ezetimibe 10 mg.   Preprocedure cardiac risk stratification: Patient is scheduled to undergo colonoscopy on 01/30/2023.  Per [facility] Activity Status Index she can achieve > 4 METs.  Per Revised Cardiac Risk Index, she is class I risk with an estimated rate of 3.9% for adverse cardiac event in the periprocedural timeframe.  She may proceed with low risk noncardiac procedure at an overall class I risk without further cardiac testing.       Disposition: F/u with Dr. Mariah Milling or an APP in 2 months.     Medication Adjustments/Labs and Tests Ordered: Current medicines are reviewed at length with the patient today.  Concerns regarding medicines are outlined above. Medication changes, Labs and Tests ordered today are summarized above and listed in the Patient Instructions accessible in Encounters.    SignedEula Listen, PA-C 01/24/2023 11:24 AM     [facility] [place] [address] [place], [place] [address] [phone]

## 2023-01-27 ENCOUNTER — Encounter: Payer: Self-pay | Admitting: Gastroenterology

## 2023-01-30 ENCOUNTER — Ambulatory Visit: Payer: Managed Care, Other (non HMO) | Admitting: Certified Registered"

## 2023-01-30 ENCOUNTER — Encounter
Admission: RE | Disposition: A | Discharge status: Home / Self Care | Payer: Self-pay | Source: Ambulatory Visit | Attending: Gastroenterology

## 2023-01-30 ENCOUNTER — Ambulatory Visit
Admission: RE | Admit: 2023-01-30 | Discharge: 2023-01-30 | Disposition: A | Discharge status: Home / Self Care | Payer: Managed Care, Other (non HMO) | Source: Ambulatory Visit | Attending: Gastroenterology | Admitting: Gastroenterology

## 2023-01-30 DIAGNOSIS — I480 Paroxysmal atrial fibrillation: Secondary | ICD-10-CM | POA: Diagnosis not present

## 2023-01-30 DIAGNOSIS — I10 Essential (primary) hypertension: Secondary | ICD-10-CM | POA: Insufficient documentation

## 2023-01-30 DIAGNOSIS — K5730 Diverticulosis of large intestine without perforation or abscess without bleeding: Secondary | ICD-10-CM | POA: Diagnosis not present

## 2023-01-30 DIAGNOSIS — D122 Benign neoplasm of ascending colon: Secondary | ICD-10-CM | POA: Diagnosis not present

## 2023-01-30 DIAGNOSIS — Z7984 Long term (current) use of oral hypoglycemic drugs: Secondary | ICD-10-CM | POA: Insufficient documentation

## 2023-01-30 DIAGNOSIS — K635 Polyp of colon: Secondary | ICD-10-CM

## 2023-01-30 DIAGNOSIS — Z860101 Personal history of adenomatous and serrated colon polyps: Secondary | ICD-10-CM | POA: Diagnosis not present

## 2023-01-30 DIAGNOSIS — G4733 Obstructive sleep apnea (adult) (pediatric): Secondary | ICD-10-CM | POA: Diagnosis not present

## 2023-01-30 DIAGNOSIS — Z833 Family history of diabetes mellitus: Secondary | ICD-10-CM | POA: Insufficient documentation

## 2023-01-30 DIAGNOSIS — I341 Nonrheumatic mitral (valve) prolapse: Secondary | ICD-10-CM | POA: Diagnosis not present

## 2023-01-30 DIAGNOSIS — Z87891 Personal history of nicotine dependence: Secondary | ICD-10-CM | POA: Insufficient documentation

## 2023-01-30 DIAGNOSIS — Z79899 Other long term (current) drug therapy: Secondary | ICD-10-CM | POA: Insufficient documentation

## 2023-01-30 DIAGNOSIS — E119 Type 2 diabetes mellitus without complications: Secondary | ICD-10-CM | POA: Insufficient documentation

## 2023-01-30 DIAGNOSIS — Z1211 Encounter for screening for malignant neoplasm of colon: Secondary | ICD-10-CM | POA: Insufficient documentation

## 2023-01-30 DIAGNOSIS — Z860100 Personal history of colon polyps, unspecified: Secondary | ICD-10-CM

## 2023-01-30 DIAGNOSIS — Z7982 Long term (current) use of aspirin: Secondary | ICD-10-CM | POA: Diagnosis not present

## 2023-01-30 DIAGNOSIS — Z79890 Hormone replacement therapy (postmenopausal): Secondary | ICD-10-CM | POA: Insufficient documentation

## 2023-01-30 DIAGNOSIS — E039 Hypothyroidism, unspecified: Secondary | ICD-10-CM | POA: Insufficient documentation

## 2023-01-30 DIAGNOSIS — E785 Hyperlipidemia, unspecified: Secondary | ICD-10-CM | POA: Diagnosis not present

## 2023-01-30 DIAGNOSIS — K219 Gastro-esophageal reflux disease without esophagitis: Secondary | ICD-10-CM | POA: Diagnosis not present

## 2023-01-30 DIAGNOSIS — K640 First degree hemorrhoids: Secondary | ICD-10-CM | POA: Diagnosis not present

## 2023-01-30 DIAGNOSIS — Z7901 Long term (current) use of anticoagulants: Secondary | ICD-10-CM | POA: Insufficient documentation

## 2023-01-30 DIAGNOSIS — D126 Benign neoplasm of colon, unspecified: Secondary | ICD-10-CM

## 2023-01-30 DIAGNOSIS — D128 Benign neoplasm of rectum: Secondary | ICD-10-CM | POA: Insufficient documentation

## 2023-01-30 HISTORY — PX: POLYPECTOMY: SHX5525

## 2023-01-30 HISTORY — PX: HEMOSTASIS CLIP PLACEMENT: SHX6857

## 2023-01-30 HISTORY — PX: COLONOSCOPY WITH PROPOFOL: SHX5780

## 2023-01-30 LAB — GLUCOSE, CAPILLARY: Glucose-Capillary: 180 mg/dL — ABNORMAL HIGH (ref 70–99)

## 2023-01-30 SURGERY — COLONOSCOPY WITH PROPOFOL
Anesthesia: General

## 2023-01-30 MED ORDER — LIDOCAINE HCL (CARDIAC) PF 100 MG/5ML IV SOSY
PREFILLED_SYRINGE | INTRAVENOUS | Status: DC | PRN
  Administered 2023-01-30: 100 mg via INTRAVENOUS

## 2023-01-30 MED ORDER — PROPOFOL 10 MG/ML IV BOLUS
INTRAVENOUS | Status: DC | PRN
  Administered 2023-01-30: 140 ug/kg/min via INTRAVENOUS
  Administered 2023-01-30: 100 mg via INTRAVENOUS

## 2023-01-30 MED ORDER — PROPOFOL 10 MG/ML IV BOLUS
INTRAVENOUS | Status: AC
  Filled 2023-01-30: qty 40

## 2023-01-30 MED ORDER — LIDOCAINE HCL (PF) 2 % IJ SOLN
INTRAMUSCULAR | Status: AC
  Filled 2023-01-30: qty 5

## 2023-01-30 MED ORDER — SODIUM CHLORIDE 0.9 % IV SOLN: INTRAVENOUS | Status: DC

## 2023-01-30 NOTE — Anesthesia Preprocedure Evaluation (Signed)
Anesthesia Evaluation  Patient identified by MRN, date of birth, ID band Patient awake    Reviewed: Allergy & Precautions, H&P , NPO status , Patient's Chart, lab work & pertinent test results, reviewed documented beta blocker date and time   Airway Mallampati: II  TM Distance: >3 FB Neck ROM: full    Dental  (+) Caps, Dental Advidsory Given, Teeth Intact   Pulmonary neg shortness of breath, sleep apnea and Continuous Positive Airway Pressure Ventilation , neg COPD, neg recent URI, former smoker   Pulmonary exam normal        Cardiovascular Exercise Tolerance: Good hypertension, (-) angina + CAD  (-) Past MI, (-) Cardiac Stents and (-) CABG + dysrhythmias Atrial Fibrillation + Valvular Problems/Murmurs MVP      Neuro/Psych negative neurological ROS  negative psych ROS   GI/Hepatic Neg liver ROS,GERD  ,,  Endo/Other  diabetesHypothyroidism    Renal/GU negative Renal ROS  negative genitourinary   Musculoskeletal   Abdominal   Peds  Hematology negative hematology ROS (+)   Anesthesia Other Findings Past Medical History: No date: Atrial fibrillation (HCC) No date: Carotid artery occlusion     Comment:  20 % left side No date: Diabetes mellitus without complication (HCC) No date: GERD (gastroesophageal reflux disease) No date: History of colonic polyps     Comment:  benign No date: Hyperlipidemia No date: Hypertension No date: Hypothyroidism No date: Lung infection No date: Mitral valve prolapse No date: Obstructive sleep apnea     Comment:  on CPAP No date: Sleep apnea No date: Thyroid disease No date: Ventricular tachycardia (paroxysmal) (HCC)   Reproductive/Obstetrics negative OB ROS                              Anesthesia Physical Anesthesia Plan  ASA: 3  Anesthesia Plan: General   Post-op Pain Management: Minimal or no pain anticipated   Induction: Intravenous  PONV  Risk Score and Plan: 3 and Propofol infusion, TIVA and Ondansetron  Airway Management Planned: Nasal Cannula  Additional Equipment: None  Intra-op Plan:   Post-operative Plan:   Informed Consent: I have reviewed the patients History and Physical, chart, labs and discussed the procedure including the risks, benefits and alternatives for the proposed anesthesia with the patient or authorized representative who has indicated his/her understanding and acceptance.     Dental advisory given  Plan Discussed with: CRNA and Surgeon  Anesthesia Plan Comments: (Discussed risks of anesthesia with patient, including possibility of difficulty with spontaneous ventilation under anesthesia necessitating airway intervention, PONV, and rare risks such as cardiac or respiratory or neurological events, and allergic reactions. Discussed the role of CRNA in patient's perioperative care. Patient understands.)         Anesthesia Quick Evaluation

## 2023-01-30 NOTE — Op Note (Signed)
[facility] Gastroenterology Patient Name: Debra Perez Procedure Date: 01/30/2023 8:39 AM MRN: [id number] Account #: [id number] Date of Birth: [birth year] Admit Type: Outpatient Age: 67 Room: [facility] ENDO ROOM 1 Gender: Female Note Status: Finalized Instrument Name: Prentice Docker [id number] Procedure:             Colonoscopy Indications:           Surveillance: Personal history of adenomatous polyps                         on last colonoscopy 5 years ago Providers:             [name] MD, MD Referring MD:          Doreene Nest (Referring MD) Medicines:             Monitored Anesthesia Care Complications:         No immediate complications. Procedure:             Pre-Anesthesia Assessment:                        - Prior to the procedure, a History and Physical was                         performed, and patient medications, allergies and                         sensitivities were reviewed. The patient's tolerance                         of previous anesthesia was reviewed.                        - The risks and benefits of the procedure and the                         sedation options and risks were discussed with the                         patient. All questions were answered and informed                         consent was obtained.                        - ASA Grade Assessment: II - A patient with mild                         systemic disease.                        After obtaining informed consent, the colonoscope was                         passed under direct vision. Throughout the procedure,                         the patient's blood pressure, pulse, and oxygen                         saturations  were monitored continuously. The                         Colonoscope was introduced through the anus and                         advanced to the the cecum, identified by the                         appendiceal orifice. The colonoscopy was performed                          with ease. The patient tolerated the procedure well.                         The quality of the bowel preparation was excellent.                         The ileocecal valve, appendiceal orifice, and rectum                         were photographed. Findings:      The perianal and digital rectal examinations were normal.      Two sessile polyps were found in the ascending colon. The polyps were 5       to 8 mm in size. These polyps were removed with a cold snare. Resection       and retrieval were complete. To prevent bleeding after the polypectomy,       one hemostatic clip was successfully placed. Clip manufacturer: [employer]. There was no bleeding at the end of the procedure.      A 5 mm polyp was found in the rectum. The polyp was sessile. The polyp       was removed with a cold snare. Resection and retrieval were complete.      Multiple medium-mouthed diverticula were found in the sigmoid colon.      Non-bleeding internal hemorrhoids were found during retroflexion. The       hemorrhoids were medium-sized and Grade I (internal hemorrhoids that do       not prolapse).      The exam was otherwise without abnormality on direct and retroflexion       views. Impression:            - Two 5 to 8 mm polyps in the ascending colon, removed                         with a cold snare. Resected and retrieved. Clip was                         placed. Clip manufacturer: [employer].                        - One 5 mm polyp in the rectum, removed with a cold                         snare. Resected and retrieved.                        -  Diverticulosis in the sigmoid colon.                        - Non-bleeding internal hemorrhoids.                        - The examination was otherwise normal on direct and                         retroflexion views. Recommendation:        - Discharge patient to home.                        - Resume previous diet.                         - Continue present medications.                        - Await pathology results.                        - Repeat colonoscopy in 3 - 5 years for surveillance                         based on pathology results. Procedure Code(s):     --- Professional ---                        [phone], Colonoscopy, flexible; with removal of                         tumor(s), polyp(s), or other lesion(s) by snare                         technique Diagnosis Code(s):     --- Professional ---                        Z86.010, Personal history of colonic polyps                        D12.2, Benign neoplasm of ascending colon                        D12.8, Benign neoplasm of rectum                        K64.0, First degree hemorrhoids                        K57.30, Diverticulosis of large intestine without                         perforation or abscess without bleeding CPT copyright 2022 American Medical Association. All rights reserved. The codes documented in this report are preliminary and upon coder review may  be revised to meet current compliance requirements. [name], MD [name] MD, MD 01/30/2023 9:01:36 AM This report has been signed electronically. Number of Addenda: 0 Note Initiated On: 01/30/2023 8:39 AM Scope Withdrawal Time: 0 hours 11 minutes 45 seconds  Total Procedure Duration: 0 hours 14 minutes 31 seconds  Estimated Blood Loss:  Estimated blood loss: none.      [facility]

## 2023-01-30 NOTE — Anesthesia Postprocedure Evaluation (Signed)
Anesthesia Post Note  Patient: Environmental health practitioner  Procedure(s) Performed: COLONOSCOPY WITH PROPOFOL POLYPECTOMY HEMOSTASIS CLIP PLACEMENT  Patient location during evaluation: PACU Anesthesia Type: General Level of consciousness: awake and alert Pain management: pain level controlled Vital Signs Assessment: post-procedure vital signs reviewed and stable Respiratory status: spontaneous breathing, nonlabored ventilation, respiratory function stable and patient connected to nasal cannula oxygen Cardiovascular status: blood pressure returned to baseline and stable Postop Assessment: no apparent nausea or vomiting Anesthetic complications: no  No notable events documented.   Last Vitals:  Vitals:   01/30/23 0905 01/30/23 0910  BP: 107/68 128/76  Pulse: (!) 51 (!) 56  Resp: (!) 21 18  Temp: (!) 35.9 C   SpO2: 99% 97%    Last Pain:  Vitals:   01/30/23 0905  TempSrc: Temporal  PainSc: 0-No pain                 Stephanie Coup

## 2023-01-30 NOTE — Transfer of Care (Signed)
Immediate Anesthesia Transfer of Care Note  Patient: Environmental health practitioner  Procedure(s) Performed: COLONOSCOPY WITH PROPOFOL POLYPECTOMY HEMOSTASIS CLIP PLACEMENT  Patient Location: PACU  Anesthesia Type:General  Level of Consciousness: awake, alert , and oriented  Airway & Oxygen Therapy: Patient Spontanous Breathing  Post-op Assessment: Report given to RN and Post -op Vital signs reviewed and stable  Post vital signs: Reviewed and stable  Last Vitals:  Vitals Value Taken Time  BP 107/68 0904  Temp 35.8 0904  Pulse 58 0904  Resp 16 0904  SpO2 98 0904    Last Pain:  Vitals:   01/30/23 0826  TempSrc: Temporal  PainSc: 6          Complications: No notable events documented.

## 2023-01-30 NOTE — H&P (Signed)
[name], MD [address], [address], [place], [place], [address] [address], [place], [place], [address] Phone: [phone]  Fax: [phone]  Primary Care Physician:  Doreene Nest, NP   Pre-Procedure History & Physical: HPI:  Debra Perez is a 67 y.o. female is here for an colonoscopy.   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  On Eliquis; b. CHADS2VASc => 3 (HTN, DM, female)   Skin ulcer of left ankle, limited to breakdown of skin (HCC) 04/02/2020   Sleep apnea     Past Surgical History:  Procedure Laterality Date   ABDOMINAL HYSTERECTOMY  03/07/2000   ovaries remain   CLAVICLE SURGERY  03/07/2005   right clavicle plate pins   COLONOSCOPY WITH PROPOFOL N/A 10/17/2017   Procedure: COLONOSCOPY WITH PROPOFOL;  Surgeon: [name], MD;  Location: [facility] ENDOSCOPY;  Service: Gastroenterology;  Laterality: N/A;   ESOPHAGOGASTRODUODENOSCOPY (EGD) WITH PROPOFOL N/A 10/17/2017   Procedure: ESOPHAGOGASTRODUODENOSCOPY (EGD) WITH PROPOFOL;  Surgeon: [name], MD;  Location: [facility] ENDOSCOPY;  Service: Gastroenterology;  Laterality: N/A;   GIVENS CAPSULE STUDY N/A 11/30/2017   Procedure: GIVENS CAPSULE STUDY;  Surgeon: [name], MD;  Location: [facility] ENDOSCOPY;  Service: Gastroenterology;  Laterality: N/A;   KNEE ARTHROSCOPY  03/07/2010   Left foot surgery Left 03/03/2022    Prior to Admission medications   Medication Sig Start Date End Date Taking? Authorizing Provider  [name] 50 MG  tablet TAKE 1 TABLET BY MOUTH AT BEDTIME FOR BACK PAIN 01/24/23  Yes Doreene Nest, NP  metoprolol succinate (TOPROL-XL) 25 MG 24 hr tablet Take 1 tablet (25 mg total) by mouth 2 (two) times daily. Take with or immediately following a meal. 03/24/22  Yes Gollan, Tollie Pizza, MD  propafenone (RYTHMOL SR) 225 MG 12 hr capsule TAKE 1 CAPSULE BY MOUTH 2 TIMES DAILY 11/15/22  Yes Gollan, Tollie Pizza, MD  sitaGLIPtin-metformin (JANUMET) 50-1000 MG tablet TAKE 1 TABLET TWICE A DAY WITH MEALS FOR DIABETES 01/03/23  Yes Doreene Nest, NP  SYNTHROID 88 MCG tablet Take 1 tablet by mouth every morning on an empty stomach with water only.  No food or other medications for 30 minutes. 01/23/23  Yes Doreene Nest, NP  verapamil (CALAN-SR) 120 MG CR tablet Take 1 tablet (120 mg total) by mouth daily. 10/31/22  Yes Dunn, Raymon Mutton, PA-C  Accu-Chek FastClix Lancets MISC USE TO TEST BLOOD SUGAR TWICE DAILY AS DIRECTED 07/28/21   Doreene Nest, NP  aspirin EC 81 MG tablet Take 81 mg by mouth daily. Swallow whole.    [provider]  b complex vitamins capsule Take 1 capsule by mouth 2 (two) times daily.    [provider]  Continuous Glucose Sensor (FREESTYLE LIBRE 3 SENSOR) MISC PLACE 1 SENSOR ON THE SKIN EVERY 14 DAYS. USE TO CHECK GLUCOSE CONTINUOUSLY 09/29/22   Doreene Nest, NP  ezetimibe (ZETIA) 10 MG tablet TAKE  1 TABLET BY MOUTH DAILY 06/30/22   Antonieta Iba, MD  famotidine (PEPCID) 20 MG tablet Take 1 tablet (20 mg total) by mouth 2 (two) times daily. For heartburn. 04/22/22   Doreene Nest, NP  gentamicin ointment (GARAMYCIN) 0.1 % Apply 1 application topically daily as needed. For nasal sore. 10/13/20   Doreene Nest, NP  glipiZIDE (GLUCOTROL XL) 5 MG 24 hr tablet Take 1 tablet (5 mg total) by mouth daily with breakfast. for diabetes. 12/06/22   Doreene Nest, NP  hydrochlorothiazide (HYDRODIURIL) 25 MG tablet Take 1 tablet (25 mg total) by mouth daily. 04/21/22  04/16/23  Antonieta Iba, MD  ipratropium (ATROVENT HFA) 17 MCG/ACT inhaler Inhale 2 puffs into the lungs every 4 (four) hours as needed for wheezing. 01/16/22   White, Elita Boone, NP  Iron-Vitamin C 65-125 MG TABS Take by mouth daily.     [provider]  losartan (COZAAR) 50 MG tablet Take 1 tablet (50 mg total) by mouth daily. 10/31/22 01/29/23  Sondra Barges, PA-C  Magnesium 500 MG CAPS Take 1 capsule by mouth 2 (two) times daily.    [provider]  methocarbamol (ROBAXIN) 750 MG tablet Take 375-750 mg by mouth at bedtime as needed. 08/31/21   [provider]  metoprolol tartrate (LOPRESSOR) 25 MG tablet Take 0.5 tablet (12.5 mg) by mouth once daily as needed for palpitations 01/24/23   Sondra Barges, PA-C  Multiple Vitamins-Minerals (MULTIVITAMIN ADULT PO) Take 1 capsule by mouth daily.     [provider]  rosuvastatin (CRESTOR) 5 MG tablet TAKE 1 TABLET BY MOUTH DAILY GENERIC EQUIVALENT FOR CRESTOR 12/13/22   Antonieta Iba, MD    Allergies as of 12/08/2022   (No Known Allergies)    Family History  Problem Relation Age of Onset   Depression Mother    Sudden death Mother 61   Heart attack Mother    Arthritis Father    Heart disease Father    Hypertension Father    Emphysema Father    Bladder Cancer Sister    Diabetes Sister        Type 2   Diabetes Brother        type   Hypertension Brother    Emphysema Brother    Heart disease Brother    Diabetes Maternal Grandfather    Bladder Cancer Maternal Grandmother        sinus cancer    Breast cancer Paternal Grandmother    Cancer Paternal Grandfather        colon cancer   Diabetes Paternal Grandfather     Social History   Socioeconomic History   Marital status: Married    Spouse name: Not on file   Number of children: Not on file   Years of education: Not on file   Highest education level: Not on file  Occupational History   Occupation: retired  Tobacco Use   Smoking status:  Former    Current packs/day: 0.00    Average packs/day: 1 pack/day for 20.0 years (20.0 ttl pk-yrs)    Types: Cigarettes    Start date: 03/07/1984    Quit date: 03/07/2004    Years since quitting: 18.9   Smokeless tobacco: Never  Vaping Use   Vaping status: Never Used  Substance and Sexual Activity   Alcohol use: No    Alcohol/week: 0.0 standard drinks of alcohol   Drug use: No   Sexual activity: Not on file  Other  Topics Concern   Not on file  Social History Narrative   Married.   1 daughter.   Moved here from [place] due to husbands occupation.   She is a Public house manager.   Enjoys sewing, gardening.   Social Determinants of Health   Financial Resource Strain: Not on file  Food Insecurity: Not on file  Transportation Needs: Not on file  Physical Activity: Not on file  Stress: Not on file  Social Connections: Not on file  Intimate Partner Violence: Not on file    Review of Systems: See HPI, otherwise negative ROS  Physical Exam: BP 134/62   Pulse (!) 59   Temp 97.6 F (36.4 C) (Temporal)   Resp 18   SpO2 99%  General:   Alert,  pleasant and cooperative in NAD Head:  Normocephalic and atraumatic. Neck:  Supple; no masses or thyromegaly. Lungs:  Clear throughout to auscultation, normal respiratory effort.    Heart:  +S1, +S2, Regular rate and rhythm, No edema. Abdomen:  Soft, nontender and nondistended. Normal bowel sounds, without guarding, and without rebound.   Neurologic:  Alert and  oriented x4;  grossly normal neurologically.  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  Questions have been answered.  All parties agreeable.   [name], MD  01/30/2023, 8:36 AM

## 2023-01-31 ENCOUNTER — Encounter: Payer: Self-pay | Admitting: Gastroenterology

## 2023-02-03 LAB — SURGICAL PATHOLOGY

## 2023-02-06 ENCOUNTER — Encounter: Payer: Self-pay | Admitting: Gastroenterology

## 2023-02-08 ENCOUNTER — Encounter: Payer: Self-pay | Admitting: Oncology

## 2023-02-13 ENCOUNTER — Other Ambulatory Visit: Payer: Self-pay | Admitting: Cardiovascular Disease

## 2023-02-23 ENCOUNTER — Other Ambulatory Visit: Payer: Self-pay | Admitting: Cardiovascular Disease

## 2023-03-05 ENCOUNTER — Encounter: Payer: Self-pay | Admitting: Oncology

## 2023-03-06 ENCOUNTER — Encounter: Payer: Self-pay | Admitting: Oncology

## 2023-03-09 ENCOUNTER — Ambulatory Visit: Payer: Medicare Other | Attending: Physician Assistant

## 2023-03-09 DIAGNOSIS — E1159 Type 2 diabetes mellitus with other circulatory complications: Secondary | ICD-10-CM

## 2023-03-09 DIAGNOSIS — I340 Nonrheumatic mitral (valve) insufficiency: Secondary | ICD-10-CM | POA: Insufficient documentation

## 2023-03-09 LAB — ECHOCARDIOGRAM COMPLETE
Area-P 1/2: 2.2 cm2
S' Lateral: 2.9 cm

## 2023-03-10 ENCOUNTER — Other Ambulatory Visit: Payer: Self-pay | Admitting: Cardiovascular Disease

## 2023-03-10 MED ORDER — CONTOUR NEXT TEST VI STRP: ORAL_STRIP | 1 refills | Status: DC

## 2023-03-27 ENCOUNTER — Other Ambulatory Visit: Payer: Self-pay | Admitting: Primary Care

## 2023-03-27 ENCOUNTER — Other Ambulatory Visit: Payer: Self-pay | Admitting: Cardiovascular Disease

## 2023-03-27 DIAGNOSIS — E782 Mixed hyperlipidemia: Secondary | ICD-10-CM

## 2023-03-27 DIAGNOSIS — Z1231 Encounter for screening mammogram for malignant neoplasm of breast: Secondary | ICD-10-CM

## 2023-03-29 NOTE — Progress Notes (Unsigned)
Cardiology Office Note    Date:  03/30/2023   ID:  Debra Perez, DOB [birth year], MRN [id number]  PCP:  Doreene Nest, NP  Cardiologist:  Julien Nordmann, MD  Electrophysiologist:  None   Chief Complaint: Follow-up  History of Present Illness:   Debra Perez is a 68 y.o. 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   Remote stress testing from 2005 in [place] showed no significant ischemia or prior MI, normal LV size and function with an EF of 80%, adequate exercise tolerance, good blood pressure and heart rate response to exercise.  Normal study.  Cardiac CT from 2016 showed an EF of 73%, ectatic ascending aorta measuring 3.72 cm in caliber, normal caliber of the descending segment, three-vessel CAD with mild multifocal calcific narrowing of the proximal LAD and proximal ramus intermedius.  Mild calcific narrowing in the proximal RCA with minimal narrowing in the middle third.  No critical or flow-limiting disease was identified.  Echo from 2016 showed an EF of 60 to 65%, mild concentric LVH, diastolic dysfunction, left atrium upper limits of normal, mild posterior MVP, trace mitral regurgitation, RVSP 27 mmHg, no pericardial effusion.  Patient has previously attributed her A. fib to a URI while on steroids and albuterol.  She has previously not tolerated scheduled beta-blockers secondary to fatigue and reported weight gain.  Multaq led to hair loss.  Echo in 03/2018 showed an EF of 60 to 65%, diastolic dysfunction, normal size left atrium, normal size right atrium, no significant  valvular abnormalities, aortic root 2.8 cm, ascending aorta 3.2 cm, aortic arch 2.8 cm.  Outpatient cardiac monitoring showed an overall sinus rhythm with an average heart rate of 71 bpm.  There were 6 SVT/atrial tachycardic runs with the fastest interval lasting 5 beats with a maximum rate of 167 bpm and the longest interval lasting 14 beats with an average rate of 127 bpm.  Isolated PACs, atrial couplets, and atrial triplets were rare.  Isolated PVCs were rare.  Patient triggered events were either not associated with significant arrhythmia or with clusters of PACs.  CTA aorta on 08/17/2018 that showed an essentially unchanged thoracic aorta measuring 3.8 x 3.6 cm with minimal aortic atherosclerosis and coronary artery calcification.     She was seen in our office in 07/2022 reporting an approximate 4-week history of randomly occurring heart rates from the 30s to 140s bpm with increased fatigue, dizziness, and dyspnea at times with symptoms lasting from several seconds to episodes of tachypalpitations that would persist all day long.  Strips reviewed at that time showed sinus bradycardia, and at times frequent PVCs in a pattern of bigeminy as well as intermittent idioventricular rhythm.  She was taking Toprol-XL 25 mg twice daily, propafenone 225 mg twice daily, and verapamil 240 mg daily.  It was recommended she decrease verapamil to 120 mg daily with continuation of Toprol and propafenone.  Subsequent Zio patch demonstrated a predominant rhythm of sinus with an average rate of 64 bpm with a range of 49 to 146 bpm, 5 episodes of SVT occurred with the fastest interval lasting 6 beats with a maximum rate of 146  bpm and the longest interval lasting 7 beats with an average rate of 121 bpm, rare PACs, atrial couplets and triplets, PVCs, and ventricular couplets.  Seven patient triggered events occurred with sinus rhythm.  She was seen in the office in 08/2022 and felt better on lower dose of verapamil with no further  significant bradycardic rates.  She had developed a cough with lisinopril which was discontinued with initiation of losartan.  She was last seen in the office in 01/2023 noting an increase in palpitations.  She remained active at baseline.  With prior echo at outside institution showing mitral regurgitation and MVP, she underwent echo in 03/2023 that showed an EF of 60 to 65%, no regional wall motion abnormalities, grade 1 diastolic dysfunction, normal RV systolic function and ventricular cavity size, mildly dilated left atrium, mild mitral regurgitation with moderate late systolic prolapse of both leaflets of the mitral valve, and an estimated right atrial pressure of 3 mmHg.  She comes in accompanied by her husband today and is doing well from a cardiac perspective.  She is without symptoms of angina or cardiac decompensation.  She notes an improvement in her dizziness.  No presyncope, or syncope.  She does continue to note some intermittent short-lived palpitations, the prior.  She is adherent and tolerating cardiac medications.  Blood pressure typically well-controlled at home.  No significant lower extremity swelling or progressive orthopnea.  No falls or symptoms concerning for bleeding.  Overall feels like she is doing well from a cardiac perspective and does not have any acute concerns at this time.   Labs independently reviewed: 12/2022 - TSH normal, potassium 4.5, BUN 10, serum creatinine 0.84, albumin 4.0, AST/ALT normal, A1c 7.0, TC 117, TG 107, HDL 49, LDL 46 07/2022 - Hgb 13.0, PLT 258  Past Medical History:  Diagnosis Date   Carotid artery occlusion    20 % left side   Diabetes mellitus without complication (HCC)    Diverticulosis    GERD (gastroesophageal reflux disease)    Gross hematuria 02/02/2022   History of colonic polyps    benign   Hyperlipidemia    Hypertension    Hypothyroidism    Lung infection    Mitral valve prolapse    NSVT (nonsustained ventricular tachycardia)  (HCC)    Obstructive sleep apnea    on CPAP   PAF (paroxysmal atrial fibrillation) (HCC)    a.  On Eliquis; b. CHADS2VASc => 3 (HTN, DM, female)   Skin ulcer of left ankle, limited to breakdown of skin (HCC) 04/02/2020   Sleep apnea     Past Surgical History:  Procedure Laterality Date   ABDOMINAL HYSTERECTOMY  03/07/2000   ovaries remain   CLAVICLE SURGERY  03/07/2005   right clavicle plate pins   COLONOSCOPY WITH PROPOFOL N/A 10/17/2017   Procedure: COLONOSCOPY WITH PROPOFOL;  Surgeon: [name], MD;  Location: [facility] ENDOSCOPY;  Service: Gastroenterology;  Laterality: N/A;   COLONOSCOPY WITH PROPOFOL N/A 01/30/2023   Procedure: COLONOSCOPY WITH PROPOFOL;  Surgeon: [name], MD;  Location: [facility] ENDOSCOPY;  Service: Gastroenterology;  Laterality: N/A;   ESOPHAGOGASTRODUODENOSCOPY (EGD) WITH PROPOFOL N/A 10/17/2017   Procedure: ESOPHAGOGASTRODUODENOSCOPY (EGD) WITH PROPOFOL;  Surgeon: [name], MD;  Location: [facility] ENDOSCOPY;  Service: Gastroenterology;  Laterality: N/A;   GIVENS CAPSULE STUDY N/A 11/30/2017   Procedure: GIVENS CAPSULE STUDY;  Surgeon: [name], MD;  Location: [facility] ENDOSCOPY;  Service: Gastroenterology;  Laterality: N/A;   HEMOSTASIS CLIP PLACEMENT  01/30/2023   Procedure: HEMOSTASIS CLIP PLACEMENT;  Surgeon: [name], MD;  Location: [facility] ENDOSCOPY;  Service: Gastroenterology;;   KNEE ARTHROSCOPY  03/07/2010   Left foot surgery Left 03/03/2022   POLYPECTOMY  01/30/2023   Procedure: POLYPECTOMY;  Surgeon: [name], MD;  Location: [facility] ENDOSCOPY;  Service: Gastroenterology;;    Current Medications: Current Meds  Medication Sig   Accu-Chek FastClix Lancets MISC USE TO TEST BLOOD SUGAR TWICE DAILY AS DIRECTED   [name] 50 MG tablet TAKE 1 TABLET BY MOUTH AT BEDTIME FOR BACK PAIN   aspirin EC 81 MG tablet Take 81 mg by mouth daily. Swallow whole.   b complex vitamins capsule Take 1 capsule by mouth 2 (two) times daily.   ezetimibe (ZETIA)  10 MG tablet TAKE 1 TABLET BY MOUTH DAILY   famotidine (PEPCID) 20 MG tablet Take 1 tablet (20 mg total) by mouth 2 (two) times daily. For heartburn.   gentamicin ointment (GARAMYCIN) 0.1 % Apply 1 application topically daily as needed. For nasal sore.   glipiZIDE (GLUCOTROL XL) 5 MG 24 hr tablet Take 1 tablet (5 mg total) by mouth daily with breakfast. for diabetes.   glucose blood (CONTOUR NEXT TEST) test strip Use to check blood sugar up to three times daily   hydrochlorothiazide (HYDRODIURIL) 25 MG tablet Take 1 tablet (25 mg total) by mouth daily.   ipratropium (ATROVENT HFA) 17 MCG/ACT inhaler Inhale 2 puffs into the lungs every 4 (four) hours as needed for wheezing.   Iron-Vitamin C 65-125 MG TABS Take by mouth daily.    losartan (COZAAR) 50 MG tablet Take 1 tablet (50 mg total) by mouth daily.   Magnesium 500 MG CAPS Take 1 capsule by mouth 2 (two) times daily.   methocarbamol (ROBAXIN) 750 MG tablet Take 375-750 mg by mouth at bedtime as needed.   metoprolol succinate (TOPROL-XL) 25 MG 24 hr tablet TAKE 1 TABLET TWICE A DAY WITH OR IMMEDIATELY FOLLOWING A MEAL   metoprolol tartrate (LOPRESSOR) 25 MG tablet Take 0.5 tablet (12.5 mg) by mouth once daily as needed for palpitations   Multiple Vitamins-Minerals (MULTIVITAMIN ADULT PO) Take 1 capsule by mouth daily.    propafenone (RYTHMOL SR) 225 MG 12 hr capsule TAKE 1 CAPSULE BY MOUTH 2 TIMES DAILY   rosuvastatin (CRESTOR) 5 MG tablet TAKE 1 TABLET BY MOUTH DAILY GENERIC EQUIVALENT FOR CRESTOR   sitaGLIPtin-metformin (JANUMET) 50-1000 MG tablet TAKE 1 TABLET TWICE A DAY WITH MEALS FOR DIABETES   SYNTHROID 88 MCG tablet Take 1 tablet by mouth every morning on an empty stomach with water only.  No food or other medications for 30 minutes.   verapamil (CALAN-SR) 120 MG CR tablet Take 1 tablet (120 mg total) by mouth daily.    Allergies:   Patient has no known allergies.   Social History   Socioeconomic History   Marital status: Married     Spouse name: Not on file   Number of children: Not on file   Years of education: Not on file   Highest education level: Not on file  Occupational History   Occupation: retired  Tobacco Use   Smoking status: Former    Current packs/day: 0.00    Average packs/day: 1 pack/day for 20.0 years (20.0 ttl pk-yrs)    Types: Cigarettes    Start date: 03/07/1984    Quit date: 03/07/2004    Years since quitting: 19.0   Smokeless tobacco: Never  Vaping Use   Vaping status: Never Used  Substance and Sexual Activity   Alcohol  use: No    Alcohol/week: 0.0 standard drinks of alcohol   Drug use: No   Sexual activity: Not on file  Other Topics Concern   Not on file  Social History Narrative   Married.   1 daughter.   Moved here from [place] due to husbands occupation.   She is a Public house manager.   Enjoys sewing, gardening.   Social Drivers of Corporate investment banker Strain: Not on file  Food Insecurity: Not on file  Transportation Needs: Not on file  Physical Activity: Not on file  Stress: Not on file  Social Connections: Not on file     Family History:  The patient's family history includes Arthritis in her father; Bladder Cancer in her maternal grandmother and sister; Breast cancer in her paternal grandmother; Cancer in her paternal grandfather; Depression in her mother; Diabetes in her brother, maternal grandfather, paternal grandfather, and sister; Emphysema in her brother and father; Heart attack in her mother; Heart disease in her brother and father; Hypertension in her brother and father; Sudden death (age of onset: 68) in her mother.  ROS:   12-point review of systems is negative unless otherwise noted in the HPI.   EKGs/Labs/Other Studies Reviewed:    Studies reviewed were summarized above. The additional studies were reviewed today:  2D echo 03/09/2023: 1. Left ventricular ejection fraction, by estimation, is 60 to 65%. The  left ventricle has normal function. The left ventricle  has no regional  wall motion abnormalities. Left ventricular diastolic parameters are  consistent with Grade I diastolic  dysfunction (impaired relaxation).   2. Right ventricular systolic function is normal. The right ventricular  size is normal.   3. Left atrial size was mildly dilated.   4. The mitral valve is abnormal. Mild mitral valve regurgitation. There  is moderate late systolic prolapse of both leaflets of the mitral valve.   5. The aortic valve is tricuspid. Aortic valve regurgitation is not  visualized.   6. The inferior vena cava is normal in size with greater than 50%  respiratory variability, suggesting right atrial pressure of 3 mmHg.  __________  Luci Bank patch 07/2022: Normal sinus rhythm Patient had a min HR of 49 bpm, max HR of 146 bpm, and avg HR of 64 bpm.    5 Supraventricular Tachycardia runs occurred, the run with the fastest interval lasting 6 beats with a max rate of 146 bpm, the longest lasting 7 beats with an avg rate of 121 bpm.    Isolated SVEs were rare (<1.0%), SVE Couplets were rare (<1.0%), and SVE Triplets were rare (<1.0%).  Isolated VEs were rare (<1.0%), VE Couplets were rare (<1.0%), and no VE Triplets were present.   Patient triggered events (7) associated with normal sinus rhythm __________   2D echo 04/06/2018: 1. The left ventricle has normal systolic function of 60-65%. The cavity  size is normal. There is no left ventricular wall thickness. Echo evidence  of impaired relaxation diastolic filling patterns.   2. Normal left atrial size.   3. Normal right atrial size.   4. Normal tricuspid valve.   5. There is normal right ventricular systolic function. b Right  ventricular systolic pressure could not be assessed.  __________   Luci Bank patch 03/2018: Normal sinus rhythm Average Heart rate: 71 bpm.    6 Supraventricular Tachycardia/atrial tachyardia runs occurred, the run with the fastest interval lasting 5 beats with a max rate of 167 bpm,   the longest lasting 14 beats with  an avg rate of 127 bpm.    Isolated SVEs were rare (<1.0%), SVE Couplets were rare (<1.0%), and SVE Triplets were rare (<1.0%).  Isolated VEs were rare (<1.0%), and no VE Couplets or VE Triplets were present.   Patient triggered events were either not associated with significant arrhythmia, or with clusters of APCs.   EKG:  EKG is ordered today.  The EKG ordered today demonstrates NSR with PACs, 70 bpm, RBBB, consistent with prior tracing  Recent Labs: 07/18/2022: Hemoglobin 13.0; Magnesium 1.9; Platelets 258 12/06/2022: ALT 19; BUN 10; Creatinine, Ser 0.84; Potassium 4.5; Sodium 136; TSH 1.30  Recent Lipid Panel    Component Value Date/Time   CHOL 117 12/06/2022 0910   TRIG 107.0 12/06/2022 0910   HDL 49.80 12/06/2022 0910   CHOLHDL 2 12/06/2022 0910   VLDL 21.4 12/06/2022 0910   LDLCALC 46 12/06/2022 0910    PHYSICAL EXAM:    VS:  BP (!) 142/84 (BP Location: Left Arm, Patient Position: Sitting)   Pulse 70   Ht 5\' 6"  (1.676 m)   Wt 170 lb (77.1 kg)   SpO2 98%   BMI 27.44 kg/m   BMI: Body mass index is 27.44 kg/m.  Physical Exam Vitals reviewed.  Constitutional:      Appearance: She is well-developed.  HENT:     Head: Normocephalic and atraumatic.  Eyes:     General:        Right eye: No discharge.        Left eye: No discharge.  Cardiovascular:     Rate and Rhythm: Normal rate and regular rhythm. Occasional Extrasystoles are present.    Heart sounds: Normal heart sounds, S1 normal and S2 normal. Heart sounds not distant. No midsystolic click and no opening snap. No murmur heard.    No friction rub.  Pulmonary:     Effort: Pulmonary effort is normal. No respiratory distress.     Breath sounds: Normal breath sounds. No decreased breath sounds, wheezing, rhonchi or rales.  Chest:     Chest wall: No tenderness.  Abdominal:     General: There is no distension.  Musculoskeletal:     Cervical back: Normal range of motion.     Right  lower leg: No edema.     Left lower leg: No edema.  Skin:    General: Skin is warm and dry.     Nails: There is no clubbing.  Neurological:     Mental Status: She is alert and oriented to person, place, and time.  Psychiatric:        Speech: Speech normal.        Behavior: Behavior normal.        Thought Content: Thought content normal.        Judgment: Judgment normal.     Wt Readings from Last 3 Encounters:  03/30/23 170 lb (77.1 kg)  01/24/23 172 lb 12.8 oz (78.4 kg)  12/30/22 174 lb 6.4 oz (79.1 kg)     ASSESSMENT & PLAN:   CAD involving native coronary arteries without angina: She continues to do well and is without symptoms of angina or cardiac decompensation.  Continue aggressive risk factor modification and primary prevention including aspirin 81 mg, Toprol-XL 25 mg twice daily, rosuvastatin 5 mg, and ezetimibe 10 mg.  No indication for further ischemic testing at this time.  Palpitations with idioventricular rhythm/PSVT/PVCs/NSVT: Symptoms overall well-controlled on current pharmacotherapy including Toprol-XL, verapamil, propafenone, and as needed Lopressor.  PAF: Lone episode in [place]  during acute illness.  Primary cardiologist note has indicated she previously requested to stop apixaban and has preferred to remain off OAC.  Subsequent outpatient cardiac monitoring has shown no evidence of recurrence.  She remains on Toprol-XL, propafenone, and verapamil.  Mitral valve prolapse with mitral regurgitation: Asymptomatic.  No murmur noted on exam.  Follow-up echo in 12 months.  HTN: Blood pressure is mildly elevated in the office today, though well-controlled at home.  She will keep an eye on blood pressure.  Continue current pharmacotherapy.  HLD: LDL 46 in 12/2022 with normal AST/ALT at that time.  She remains on rosuvastatin and ezetimibe as outlined above.  Ectatic ascending aorta: No evidence of aneurysm on most recent CTA in 08/2018.  Aortic root and ascending thoracic  aorta noted to be structurally normal with no evidence of dilatation on echo earlier this month.    Disposition: She would like to transition her care to our [place] office with the next visit in 12 months, sooner if needed.   Medication Adjustments/Labs and Tests Ordered: Current medicines are reviewed at length with the patient today.  Concerns regarding medicines are outlined above. Medication changes, Labs and Tests ordered today are summarized above and listed in the Patient Instructions accessible in Encounters.   Signed, Eula Listen, PA-C 03/30/2023 12:48 PM     [facility] [place] [address] [place], [place] [address] [phone]

## 2023-03-30 ENCOUNTER — Encounter: Payer: Self-pay | Admitting: Physician Assistant

## 2023-03-30 ENCOUNTER — Ambulatory Visit: Payer: Medicare Other | Attending: Physician Assistant | Admitting: Physician Assistant

## 2023-03-30 ENCOUNTER — Ambulatory Visit
Admission: RE | Admit: 2023-03-30 | Discharge: 2023-03-30 | Disposition: A | Discharge status: Home / Self Care | Payer: Medicare Other | Source: Ambulatory Visit | Attending: Primary Care | Admitting: Primary Care

## 2023-03-30 VITALS — BP 142/84 | HR 70 | Ht 66.0 in | Wt 170.0 lb

## 2023-03-30 DIAGNOSIS — I341 Nonrheumatic mitral (valve) prolapse: Secondary | ICD-10-CM | POA: Diagnosis present

## 2023-03-30 DIAGNOSIS — I471 Supraventricular tachycardia, unspecified: Secondary | ICD-10-CM

## 2023-03-30 DIAGNOSIS — I340 Nonrheumatic mitral (valve) insufficiency: Secondary | ICD-10-CM | POA: Diagnosis present

## 2023-03-30 DIAGNOSIS — Z1231 Encounter for screening mammogram for malignant neoplasm of breast: Secondary | ICD-10-CM | POA: Insufficient documentation

## 2023-03-30 DIAGNOSIS — I77819 Aortic ectasia, unspecified site: Secondary | ICD-10-CM | POA: Diagnosis present

## 2023-03-30 DIAGNOSIS — E785 Hyperlipidemia, unspecified: Secondary | ICD-10-CM

## 2023-03-30 DIAGNOSIS — R002 Palpitations: Secondary | ICD-10-CM | POA: Diagnosis present

## 2023-03-30 DIAGNOSIS — I10 Essential (primary) hypertension: Secondary | ICD-10-CM | POA: Diagnosis present

## 2023-03-30 DIAGNOSIS — I4891 Unspecified atrial fibrillation: Secondary | ICD-10-CM | POA: Diagnosis present

## 2023-03-30 DIAGNOSIS — I2510 Atherosclerotic heart disease of native coronary artery without angina pectoris: Secondary | ICD-10-CM | POA: Diagnosis present

## 2023-03-30 NOTE — Patient Instructions (Signed)
Medication Instructions:  Your physician recommends that you continue on your current medications as directed. Please refer to the Current Medication list given to you today.   *If you need a refill on your cardiac medications before your next appointment, please call your pharmacy*   Lab Work: No labs ordered today    Testing/Procedures: Your physician has requested that you have an echocardiogram in 1 year. Echocardiography is a painless test that uses sound waves to create images of your heart. It provides your doctor with information about the size and shape of your heart and how well your heart's chambers and valves are working.   You may receive an ultrasound enhancing agent through an IV if needed to better visualize your heart during the echo. This procedure takes approximately one hour.  There are no restrictions for this procedure.  This will take place at [address] [facility] [address], [place] [address]  Please note: We ask at that you not bring children with you during ultrasound (echo/ vascular) testing. Due to room size and safety concerns, children are not allowed in the ultrasound rooms during exams. Our front office staff cannot provide observation of children in our lobby area while testing is being conducted. An adult accompanying a patient to their appointment will only be allowed in the ultrasound room at the discretion of the ultrasound technician under special circumstances. We apologize for any inconvenience.    Follow-Up: At [facility], you and your health needs are our priority.  As part of our continuing mission to provide you with exceptional heart care, we have created designated Provider Care Teams.  These Care Teams include your primary Cardiologist (physician) and Advanced Practice Providers (APPs -  Physician Assistants and Nurse Practitioners) who all work together to provide you with the care you need, when you need it.  We  recommend signing up for the patient portal called "MyChart".  Sign up information is provided on this After Visit Summary.  MyChart is used to connect with patients for Virtual Visits (Telemedicine).  Patients are able to view lab/test results, encounter notes, upcoming appointments, etc.  Non-urgent messages can be sent to your provider as well.   To learn more about what you can do with MyChart, go to [email or web address].    Your next appointment:   1 year(s)  Provider:   Ronie Spies, PA

## 2023-04-13 ENCOUNTER — Encounter: Payer: Self-pay | Admitting: Emergency Medicine

## 2023-04-13 ENCOUNTER — Ambulatory Visit
Admission: EM | Admit: 2023-04-13 | Discharge: 2023-04-13 | Disposition: A | Discharge status: Home / Self Care | Payer: Medicare Other | Attending: Emergency Medicine | Admitting: Emergency Medicine

## 2023-04-13 DIAGNOSIS — J0100 Acute maxillary sinusitis, unspecified: Secondary | ICD-10-CM | POA: Diagnosis not present

## 2023-04-13 MED ORDER — AMOXICILLIN-POT CLAVULANATE 875-125 MG PO TABS: 1.0000 | ORAL_TABLET | Freq: Two times a day (BID) | ORAL | 0 refills | Status: AC

## 2023-04-13 MED ORDER — IPRATROPIUM BROMIDE 0.03 % NA SOLN: 2.0000 | Freq: Two times a day (BID) | NASAL | 0 refills | Status: DC

## 2023-04-13 NOTE — ED Provider Notes (Signed)
 CAY RALPH PELT    CSN: [id number] Arrival date & time: 04/13/23  1805      History   Chief Complaint Chief Complaint  Patient presents with   Generalized Body Aches   sinus pressure    Cough   Nasal Congestion    HPI Debra Perez is a 68 y.o. female.   Patient presents for evaluation of subjective fever, chills, malaise, fatigue, sinus pressure across the bilateral cheeks, nonproductive cough, congestion with green fluorescent mucus, left-sided ear fullness, postnasal drip, sore throat and intermittent headaches present for 8 days.  Symptoms progressively worsening over the last 3 days.  Has attempted use of saline spray, Claritin-D and Advil  with minimal improvement. Denies sob and wheezing.    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  On Eliquis ; b. CHADS2VASc => 3 (HTN, DM, female)   Skin ulcer of left ankle, limited to breakdown of skin (HCC) 04/02/2020   Sleep apnea     Patient Active Problem List   Diagnosis Date Noted   Personal history of colon polyps, unspecified 01/30/2023   Adenomatous polyp of colon 01/30/2023   Sinus pressure 01/05/2022   Chronic neck pain 04/13/2020   Varicose veins of both lower extremities 05/13/2019   Iron  deficiency anemia 10/04/2017   Mitral valve disorder 08/30/2017   Osteopenia 04/19/2017   Arthralgia 11/09/2016   Pleuritic chest pain    Chronic back pain    Coronary artery disease involving native coronary artery of native heart with angina pectoris (HCC)     Atypical chest pain 12/03/2015   SOB (shortness of breath) 08/07/2015   GERD (gastroesophageal reflux disease) 11/06/2014   NSVT (nonsustained ventricular tachycardia) (HCC) 11/06/2014   Preventative health care 11/05/2014   Paroxysmal atrial fibrillation (HCC) 09/17/2014   Hot flashes 09/17/2014   Type 2 diabetes mellitus (HCC) 08/21/2014   Essential hypertension 08/21/2014   Mixed hyperlipidemia 08/21/2014   Hypothyroidism 08/21/2014    Past Surgical History:  Procedure Laterality Date   ABDOMINAL HYSTERECTOMY  03/07/2000   ovaries remain   CLAVICLE SURGERY  03/07/2005   right clavicle plate pins   COLONOSCOPY WITH PROPOFOL  N/A 10/17/2017   Procedure: COLONOSCOPY WITH PROPOFOL ;  Surgeon: Therisa Bi, MD;  Location: [facility] ENDOSCOPY;  Service: Gastroenterology;  Laterality: N/A;   COLONOSCOPY WITH PROPOFOL  N/A 01/30/2023   Procedure: COLONOSCOPY WITH PROPOFOL ;  Surgeon: Therisa Bi, MD;  Location: [facility] ENDOSCOPY;  Service: Gastroenterology;  Laterality: N/A;   ESOPHAGOGASTRODUODENOSCOPY (EGD) WITH PROPOFOL  N/A 10/17/2017   Procedure: ESOPHAGOGASTRODUODENOSCOPY (EGD) WITH PROPOFOL ;  Surgeon: Therisa Bi, MD;  Location: [facility] ENDOSCOPY;  Service: Gastroenterology;  Laterality: N/A;   GIVENS CAPSULE STUDY N/A 11/30/2017   Procedure: GIVENS CAPSULE STUDY;  Surgeon: Therisa Bi, MD;  Location: [facility] ENDOSCOPY;  Service: Gastroenterology;  Laterality: N/A;   HEMOSTASIS CLIP PLACEMENT  01/30/2023   Procedure: HEMOSTASIS CLIP PLACEMENT;  Surgeon: Therisa Bi, MD;  Location: [facility] ENDOSCOPY;  Service: Gastroenterology;;   KNEE  ARTHROSCOPY  03/07/2010   Left foot surgery Left 03/03/2022   POLYPECTOMY  01/30/2023   Procedure: POLYPECTOMY;  Surgeon: Therisa Bi, MD;  Location: [facility] ENDOSCOPY;  Service: Gastroenterology;;    OB History   No obstetric history on file.      Home Medications    Prior to Admission medications   Medication Sig Start Date End Date Taking? Authorizing Provider   amoxicillin -clavulanate (AUGMENTIN ) 875-125 MG tablet Take 1 tablet by mouth every 12 (twelve) hours for 10 days. 04/13/23 04/23/23 Yes Latalia Etzler R, NP  ipratropium (ATROVENT ) 0.03 % nasal spray Place 2 sprays into both nostrils every 12 (twelve) hours. 04/13/23  Yes Brittiany Wiehe, Shelba SAUNDERS, NP  Accu-Chek FastClix Lancets MISC USE TO TEST BLOOD SUGAR TWICE DAILY AS DIRECTED 07/28/21   Clark, Katherine K, NP  amitriptyline  (ELAVIL ) 50 MG tablet TAKE 1 TABLET BY MOUTH AT BEDTIME FOR BACK PAIN 01/24/23   Gretta Comer POUR, NP  aspirin  EC 81 MG tablet Take 81 mg by mouth daily. Swallow whole.    [provider]  b complex vitamins capsule Take 1 capsule by mouth 2 (two) times daily.    [provider]  Continuous Glucose Sensor (FREESTYLE LIBRE 3 SENSOR) MISC PLACE 1 SENSOR ON THE SKIN EVERY 14 DAYS. USE TO CHECK GLUCOSE CONTINUOUSLY Patient not taking: Reported on 03/30/2023 09/29/22   Clark, Katherine K, NP  ezetimibe  (ZETIA ) 10 MG tablet TAKE 1 TABLET BY MOUTH DAILY 03/27/23   Gollan, Timothy J, MD  famotidine  (PEPCID ) 20 MG tablet Take 1 tablet (20 mg total) by mouth 2 (two) times daily. For heartburn. 04/22/22   Clark, Katherine K, NP  gentamicin  ointment (GARAMYCIN ) 0.1 % Apply 1 application topically daily as needed. For nasal sore. 10/13/20   Clark, Katherine K, NP  glipiZIDE  (GLUCOTROL  XL) 5 MG 24 hr tablet Take 1 tablet (5 mg total) by mouth daily with breakfast. for diabetes. 12/06/22   Gretta Comer POUR, NP  glucose blood (CONTOUR NEXT TEST) test strip Use to check blood sugar up to three times daily 03/10/23   Clark, Katherine K, NP  hydrochlorothiazide  (HYDRODIURIL ) 25 MG tablet Take 1 tablet (25 mg total) by mouth daily. 04/21/22 04/16/23  Gollan, Timothy J, MD  ipratropium (ATROVENT  HFA) 17 MCG/ACT inhaler Inhale 2 puffs into the lungs every 4 (four) hours as needed for wheezing. 01/16/22   Teresa Shelba SAUNDERS, NP  Iron -Vitamin C 65-125 MG TABS Take by mouth daily.     [provider]  losartan  (COZAAR ) 50 MG tablet Take 1 tablet (50 mg total) by mouth daily. 10/31/22 03/30/23  Abigail Bernardino HERO, PA-C  Magnesium  500 MG CAPS Take 1 capsule by mouth 2 (two) times daily.    [provider]  methocarbamol  (ROBAXIN ) 750 MG tablet Take 375-750 mg by mouth at bedtime as needed. 08/31/21   [provider]  metoprolol  succinate (TOPROL -XL) 25 MG 24 hr tablet TAKE 1 TABLET TWICE A DAY WITH OR IMMEDIATELY FOLLOWING A MEAL 02/23/23   Gollan, Timothy J, MD  metoprolol  tartrate (LOPRESSOR ) 25 MG tablet Take 0.5 tablet (12.5 mg) by mouth once daily as needed for palpitations 01/24/23   Abigail Bernardino HERO, PA-C  Multiple Vitamins-Minerals (MULTIVITAMIN ADULT PO) Take 1 capsule by mouth daily.     [provider]  propafenone  (RYTHMOL  SR) 225 MG 12 hr capsule TAKE 1 CAPSULE BY MOUTH 2 TIMES DAILY 02/13/23   Gollan, Timothy J, MD  rosuvastatin  (CRESTOR ) 5 MG tablet TAKE 1 TABLET BY  MOUTH DAILY GENERIC EQUIVALENT FOR CRESTOR  03/10/23   Gollan, Timothy J, MD  sitaGLIPtin -metformin  (JANUMET ) 50-1000 MG tablet TAKE 1 TABLET TWICE A DAY WITH MEALS FOR DIABETES 01/03/23   Clark, Katherine K, NP  SYNTHROID  88 MCG tablet Take 1 tablet by mouth every morning on an empty stomach with water only.  No food or other medications for 30 minutes. 01/23/23   Gretta Comer POUR, NP  verapamil  (CALAN -SR) 120 MG CR tablet Take 1 tablet (120 mg total) by mouth daily. 10/31/22   Abigail Bernardino HERO, PA-C    Family History Family History  Problem Relation Age of Onset   Depression Mother    Sudden death Mother 41   Heart attack Mother    Arthritis Father    Heart disease Father    Hypertension Father    Emphysema Father    Bladder Cancer Sister    Diabetes Sister        Type 2   Diabetes Brother        type   Hypertension Brother    Emphysema Brother    Heart disease Brother    Diabetes Maternal Grandfather    Bladder Cancer Maternal Grandmother        sinus cancer    Breast cancer  Paternal Grandmother    Cancer Paternal Grandfather        colon cancer   Diabetes Paternal Grandfather     Social History Social History   Tobacco Use   Smoking status: Former    Current packs/day: 0.00    Average packs/day: 1 pack/day for 20.0 years (20.0 ttl pk-yrs)    Types: Cigarettes    Start date: 03/07/1984    Quit date: 03/07/2004    Years since quitting: 19.1   Smokeless tobacco: Never  Vaping Use   Vaping status: Never Used  Substance Use Topics   Alcohol use: No    Alcohol/week: 0.0 standard drinks of alcohol   Drug use: No     Allergies   Albuterol  and Sulfa  antibiotics   Review of Systems Review of Systems  Respiratory:  Positive for cough.      Physical Exam Triage Vital Signs ED Triage Vitals  Encounter Vitals Group     BP 04/13/23 1856 (!) 145/83     Systolic BP Percentile --      Diastolic BP Percentile --      Pulse Rate 04/13/23 1856 76     Resp 04/13/23 1856 18     Temp 04/13/23 1856 98 F (36.7 C)     Temp Source 04/13/23 1856 Oral     SpO2 04/13/23 1856 96 %     Weight --      Height --      Head Circumference --      Peak Flow --      Pain Score 04/13/23 1855 0     Pain Loc --      Pain Education --      Exclude from Growth Chart --    No data found.  Updated Vital Signs BP (!) 145/83 (BP Location: Left Arm)   Pulse 76   Temp 98 F (36.7 C) (Oral)   Resp 18   SpO2 96%   Visual Acuity Right Eye Distance:   Left Eye Distance:   Bilateral Distance:    Right Eye Near:   Left Eye Near:    Bilateral Near:     Physical Exam Constitutional:      Appearance: Normal  appearance.  HENT:     Head: Normocephalic.     Right Ear: Tympanic membrane, ear canal and external ear normal.     Left Ear: Tympanic membrane, ear canal and external ear normal.     Nose: Congestion present. No rhinorrhea.     Right Sinus: Maxillary sinus tenderness present.     Left Sinus: Maxillary sinus tenderness present.     Mouth/Throat:     Mouth:  Mucous membranes are moist.     Pharynx: Oropharynx is clear.  Eyes:     Extraocular Movements: Extraocular movements intact.  Cardiovascular:     Rate and Rhythm: Normal rate and regular rhythm.     Pulses: Normal pulses.     Heart sounds: Normal heart sounds.  Pulmonary:     Effort: Pulmonary effort is normal.     Breath sounds: Normal breath sounds.  Neurological:     Mental Status: She is alert and oriented to person, place, and time. Mental status is at baseline.      UC Treatments / Results  Labs (all labs ordered are listed, but only abnormal results are displayed) Labs Reviewed - No data to display  EKG   Radiology No results found.  Procedures Procedures (including critical care time)  Medications Ordered in UC Medications - No data to display  Initial Impression / Assessment and Plan / UC Course  I have reviewed the triage vital signs and the nursing notes.  Pertinent labs & imaging results that were available during my care of the patient were reviewed by me and considered in my medical decision making (see chart for details).  Acute Nonrecurrent maxillary sinusitis  Patient is in no signs of distress nor toxic appearing.  Vital signs are stable.  Low suspicion for pneumonia, pneumothorax or bronchitis and therefore will defer imaging.  Sensation and symptomology consistent with a sinusitis, progressively worsening.  Providing bacterial coverage.  Augmentin  and ipratropium nasal spray prescribed.May use additional over-the-counter medications as needed for supportive care.  May follow-up with urgent care as needed if symptoms persist or worsen.     Final Clinical Impressions(s) / UC Diagnoses   Final diagnoses:  Acute non-recurrent maxillary sinusitis     Discharge Instructions      Today you are being treated for a sinus infection  Begin Augmentin  every morning and every evening for 10 days to clear bacteria causing symptoms to linger  You may use  ipratropium nasal spray to open and reduce congestion within the sinus passages which will help minimize symptoms, can be used twice daily    You can take Tylenol  and/or Ibuprofen  as needed for fever reduction and pain relief.   For cough: honey 1/2 to 1 teaspoon (you can dilute the honey in water or another fluid).  You can also use guaifenesin  and dextromethorphan for cough. You can use a humidifier for chest congestion and cough.  If you don't have a humidifier, you can sit in the bathroom with the hot shower running.      For sore throat: try warm salt water gargles, cepacol lozenges, throat spray, warm tea or water with lemon/honey, popsicles or ice, or OTC cold relief medicine for throat discomfort.   For congestion: take a daily anti-histamine like Zyrtec, Claritin, and a oral decongestant, such as pseudoephedrine.  You can also use Flonase  1-2 sprays in each nostril daily.   It is important to stay hydrated: drink plenty of fluids (water, gatorade/powerade/pedialyte, juices, or teas) to keep your throat  moisturized and help further relieve irritation/discomfort.    ED Prescriptions     Medication Sig Dispense Auth. Provider   amoxicillin -clavulanate (AUGMENTIN ) 875-125 MG tablet Take 1 tablet by mouth every 12 (twelve) hours for 10 days. 20 tablet Abigayle Wilinski R, NP   ipratropium (ATROVENT ) 0.03 % nasal spray Place 2 sprays into both nostrils every 12 (twelve) hours. 30 mL Teresa Shelba SAUNDERS, NP      PDMP not reviewed this encounter.   Teresa Shelba SAUNDERS, [place] 04/13/23 [phone]

## 2023-04-13 NOTE — Discharge Instructions (Signed)
 Today you are being treated for a sinus infection  Begin Augmentin  every morning and every evening for 10 days to clear bacteria causing symptoms to linger  You may use ipratropium nasal spray to open and reduce congestion within the sinus passages which will help minimize symptoms, can be used twice daily    You can take Tylenol  and/or Ibuprofen  as needed for fever reduction and pain relief.   For cough: honey 1/2 to 1 teaspoon (you can dilute the honey in water or another fluid).  You can also use guaifenesin  and dextromethorphan for cough. You can use a humidifier for chest congestion and cough.  If you don't have a humidifier, you can sit in the bathroom with the hot shower running.      For sore throat: try warm salt water gargles, cepacol lozenges, throat spray, warm tea or water with lemon/honey, popsicles or ice, or OTC cold relief medicine for throat discomfort.   For congestion: take a daily anti-histamine like Zyrtec, Claritin, and a oral decongestant, such as pseudoephedrine.  You can also use Flonase  1-2 sprays in each nostril daily.   It is important to stay hydrated: drink plenty of fluids (water, gatorade/powerade/pedialyte, juices, or teas) to keep your throat moisturized and help further relieve irritation/discomfort.

## 2023-04-13 NOTE — ED Triage Notes (Signed)
 Pt presents cough, nasal congestion, sinus pressure and bodyaches x 8 days.

## 2023-04-14 ENCOUNTER — Ambulatory Visit: Payer: Self-pay

## 2023-04-17 ENCOUNTER — Other Ambulatory Visit: Payer: Self-pay | Admitting: Cardiovascular Disease

## 2023-04-26 ENCOUNTER — Other Ambulatory Visit: Payer: Self-pay | Admitting: Primary Care

## 2023-04-26 DIAGNOSIS — E1159 Type 2 diabetes mellitus with other circulatory complications: Secondary | ICD-10-CM

## 2023-04-27 DIAGNOSIS — E1159 Type 2 diabetes mellitus with other circulatory complications: Secondary | ICD-10-CM

## 2023-04-27 MED ORDER — GLIPIZIDE ER 5 MG PO TB24: 5.0000 mg | ORAL_TABLET | Freq: Every day | ORAL | 0 refills | Status: DC

## 2023-05-08 ENCOUNTER — Other Ambulatory Visit: Payer: Self-pay | Admitting: Cardiovascular Disease

## 2023-05-15 ENCOUNTER — Emergency Department
Admission: EM | Admit: 2023-05-15 | Discharge: 2023-05-16 | Disposition: A | Discharge status: Home / Self Care | Attending: Emergency Medicine | Admitting: Emergency Medicine

## 2023-05-15 ENCOUNTER — Ambulatory Visit: Payer: Self-pay | Admitting: Primary Care

## 2023-05-15 ENCOUNTER — Other Ambulatory Visit: Payer: Self-pay

## 2023-05-15 ENCOUNTER — Emergency Department

## 2023-05-15 DIAGNOSIS — E119 Type 2 diabetes mellitus without complications: Secondary | ICD-10-CM | POA: Insufficient documentation

## 2023-05-15 DIAGNOSIS — I10 Essential (primary) hypertension: Secondary | ICD-10-CM | POA: Diagnosis not present

## 2023-05-15 DIAGNOSIS — M542 Cervicalgia: Secondary | ICD-10-CM | POA: Diagnosis present

## 2023-05-15 DIAGNOSIS — K1120 Sialoadenitis, unspecified: Secondary | ICD-10-CM | POA: Insufficient documentation

## 2023-05-15 LAB — COMPREHENSIVE METABOLIC PANEL
ALT: 25 U/L (ref 0–44)
AST: 22 U/L (ref 15–41)
Albumin: 3.7 g/dL (ref 3.5–5.0)
Alkaline Phosphatase: 31 U/L — ABNORMAL LOW (ref 38–126)
Anion gap: 10 (ref 5–15)
BUN: 11 mg/dL (ref 8–23)
CO2: 28 mmol/L (ref 22–32)
Calcium: 9 mg/dL (ref 8.9–10.3)
Chloride: 98 mmol/L (ref 98–111)
Creatinine, Ser: 0.73 mg/dL (ref 0.44–1.00)
GFR, Estimated: >60 mL/min (ref >60)
Glucose, Bld: 196 mg/dL — ABNORMAL HIGH (ref 70–99)
Potassium: 3.6 mmol/L (ref 3.5–5.1)
Sodium: 136 mmol/L (ref 135–145)
Total Bilirubin: 0.7 mg/dL (ref 0.0–1.2)
Total Protein: 6.5 g/dL (ref 6.5–8.1)

## 2023-05-15 LAB — CBC WITH DIFFERENTIAL/PLATELET
Abs Immature Granulocytes: 0.02 10*3/uL (ref 0.00–0.07)
Basophils Absolute: 0 10*3/uL (ref 0.0–0.1)
Basophils Relative: 0 %
Eosinophils Absolute: 0.1 10*3/uL (ref 0.0–0.5)
Eosinophils Relative: 2 %
HCT: 38.2 % (ref 36.0–46.0)
Hemoglobin: 12.8 g/dL (ref 12.0–15.0)
Immature Granulocytes: 0 %
Lymphocytes Relative: 21 %
Lymphs Abs: 1.6 10*3/uL (ref 0.7–4.0)
MCH: 31.8 pg (ref 26.0–34.0)
MCHC: 33.5 g/dL (ref 30.0–36.0)
MCV: 94.8 fL (ref 80.0–100.0)
Monocytes Absolute: 0.7 10*3/uL (ref 0.1–1.0)
Monocytes Relative: 9 %
Neutro Abs: 5.4 10*3/uL (ref 1.7–7.7)
Neutrophils Relative %: 68 %
Platelets: 242 10*3/uL (ref 150–400)
RBC: 4.03 MIL/uL (ref 3.87–5.11)
RDW: 12.9 % (ref 11.5–15.5)
WBC: 7.8 10*3/uL (ref 4.0–10.5)
nRBC: 0 % (ref 0.0–0.2)

## 2023-05-15 MED ORDER — AMOXICILLIN-POT CLAVULANATE ER 1000-62.5 MG PO TB12: 1.0000 | ORAL_TABLET | Freq: Two times a day (BID) | ORAL | 0 refills | Status: AC

## 2023-05-15 MED ORDER — AMOXICILLIN-POT CLAVULANATE 875-125 MG PO TABS
1.0000 | ORAL_TABLET | Freq: Once | ORAL | Status: AC
  Administered 2023-05-16: 1 via ORAL
  Filled 2023-05-15: qty 1

## 2023-05-15 MED ORDER — IBUPROFEN 600 MG PO TABS: 600.0000 mg | ORAL_TABLET | Freq: Three times a day (TID) | ORAL | 0 refills | Status: AC | PRN

## 2023-05-15 MED ORDER — IBUPROFEN 800 MG PO TABS
800.0000 mg | ORAL_TABLET | Freq: Once | ORAL | Status: AC
Start: 2023-05-16 — End: 2023-05-16
  Administered 2023-05-16: 800 mg via ORAL
  Filled 2023-05-15: qty 1

## 2023-05-15 MED ORDER — IOHEXOL 300 MG/ML  SOLN
75.0000 mL | Freq: Once | INTRAMUSCULAR | Status: AC | PRN
  Administered 2023-05-15: 75 mL via INTRAVENOUS

## 2023-05-15 NOTE — Telephone Encounter (Signed)
 Copied from CRM [id number]. Topic: Appointments - Appointment Scheduling >> May 15, 2023  2:22 PM Isabelle Course C wrote: Patient/patient representative is calling to schedule an appointment. Patient is swollen from her ear to her jaw. She is in pain and scared  Chief Complaint: facial swelling on the right Symptoms: right ear to jaw and neck swollen Frequency: started last night Pertinent Negatives: Patient denies fever, leg swelling, difficulty swallowing Disposition: [x] ED /[] Urgent Care (no appt availability in office) / [] Appointment(In office/virtual)/ []  [place] [facility]/ [] Home Care/ [] Refused Recommended Disposition /[] [place] [facility]/ []  Follow-up with PCP Additional Notes: Ate grapes last night.  Care advice given, denies questions; instructed to go to ER  Reason for Disposition  SEVERE swelling of the entire face  Answer Assessment - Initial Assessment Questions 1. ONSET: "When did the swelling start?" (e.g., minutes, hours, days)     Yesterday from ear to jaw 2. LOCATION: "What part of the face is swollen?"     Right side under right ear and jaw to the top of the neck 3. SEVERITY: "How swollen is it?"     "Pretty swelling" 4. ITCHING: "Is there any itching?" If Yes, ask: "How much?"   (Scale 1-10; mild, moderate or severe)     denies 5. PAIN: "Is the swelling painful to touch?" If Yes, ask: "How painful is it?"   (Scale 1-10; mild, moderate or severe)   - NONE (0): no pain   - MILD (1-3): doesn't interfere with normal activities    - MODERATE (4-7): interferes with normal activities or awakens from sleep    - SEVERE (8-10): excruciating pain, unable to do any normal activities      4/10 6. FEVER: "Do you have a fever?" If Yes, ask: "What is it, how was it measured, and when did it start?"      denies 7. CAUSE: "What do you think is causing the face swelling?"     Ate grapes last night 8. RECURRENT SYMPTOM: "Have you had face swelling before?" If Yes, ask: "When was  the last time?" "What happened that time?"     denies 9. OTHER SYMPTOMS: "Do you have any other symptoms?" (e.g., toothache, leg swelling)     denies 10. PREGNANCY: "Is there any chance you are pregnant?" "When was your last menstrual period?"       na  Protocols used: Face Swelling-A-AH

## 2023-05-15 NOTE — Discharge Instructions (Addendum)
 You have been diagnosed with sialoadenitis.  Please take Augmentin 1 tablet by mouth every 12 hours for 7 days.  You can take ibuprofen 1 tablet by mouth every 8 hours as needed for pain.  Please follow-up with your PCP.  Can come back to ED or go your to your PCP if you have new symptoms or symptoms worsen. Eat acidic candies to improve the inflammation.

## 2023-05-15 NOTE — Telephone Encounter (Signed)
 noted

## 2023-05-15 NOTE — ED Provider Notes (Signed)
 [facility] Provider Note    Event Date/Time   First MD Initiated Contact with Patient 05/15/23 1839     (approximate)   History   Neck Pain   HPI  Debra Perez is a 68 y.o. female who presents today with history of right neck and jaw pain that increases when she is eating.  Patient denies fever.  Patient has history of mitral valve prolapse, diabetes, hypertension.      Physical Exam   Triage Vital Signs: ED Triage Vitals  Encounter Vitals Group     BP 05/15/23 1543 (!) 145/62     Systolic BP Percentile --      Diastolic BP Percentile --      Pulse Rate 05/15/23 1543 77     Resp 05/15/23 1543 17     Temp 05/15/23 1543 98.9 F (37.2 C)     Temp Source 05/15/23 1543 Oral     SpO2 05/15/23 1543 97 %     Weight 05/15/23 1544 170 lb (77.1 kg)     Height 05/15/23 1544 5\' 6"  (1.676 m)     Head Circumference --      Peak Flow --      Pain Score 05/15/23 1547 4     Pain Loc --      Pain Education --      Exclude from Growth Chart --     Most recent vital signs: Vitals:   05/15/23 1543 05/15/23 2044  BP: (!) 145/62 (!) 122/57  Pulse: 77 69  Resp: 17 16  Temp: 98.9 F (37.2 C)   SpO2: 97% 93%     Constitutional: Alert, NAD. Able to speak in complete sentences without cough or dyspnea  Eyes: Conjunctivae are normal.  Head: Atraumatic. Nose: No congestion/rhinnorhea. Mouth/Throat: Mucous membranes are moist.  No evidence of abscess in the mucosa Neck: Painless ROM. Supple. No JVD, nodes, thyromegaly  Skin is intact, no scars no ecchymosis.  Tender to palpation in the right submandibular area with induration that extends to the preauricular area. Cardiovascular:   Good peripheral circulation.RRR no murmurs, gallops, rubs  Respiratory: Normal respiratory effort.  No retractions. Clear to auscultation bilaterally without wheezing or crackles  Gastrointestinal: Soft and nontender.  Musculoskeletal:  no deformity Neurologic:  MAE  spontaneously. No gross focal neurologic deficits are appreciated.  Skin:  Skin is warm, dry and intact. No rash noted. Psychiatric: Mood and affect are normal. Speech and behavior are normal.    ED Results / Procedures / Treatments   Labs (all labs ordered are listed, but only abnormal results are displayed) Labs Reviewed  COMPREHENSIVE METABOLIC PANEL - Abnormal; Notable for the following components:      Result Value   Glucose, Bld 196 (*)    Alkaline Phosphatase 31 (*)    All other components within normal limits  CBC WITH DIFFERENTIAL/PLATELET     EKG     RADIOLOGY I independently reviewed and interpreted imaging and agree with radiologists findings.      PROCEDURES:  Critical Care performed:   Procedures   MEDICATIONS ORDERED IN ED: Medications  amoxicillin-clavulanate (AUGMENTIN) 875-125 MG per tablet 1 tablet (has no administration in time range)  ibuprofen (ADVIL) tablet 800 mg (has no administration in time range)  iohexol (OMNIPAQUE) 300 MG/ML solution 75 mL (75 mLs Intravenous Contrast Given 05/15/23 1935)   Clinical Course as of 05/15/23 2357  Mon May 15, 2023  1916 Comprehensive metabolic panel(!) Electrolytes, renal function, AST and  ALT within normal limits.  Alkaline phosphate decreased 31 [AE]  1916 CBC with Differential White blood cells hemoglobin and platelets within normal limits [AE]  2348 CT Soft Tissue Neck W Contrast Mild fat stranding adjacent to the right parotid gland and, to a lesser extent, the right submandibular gland. No sialolithiasis. Findings are consistent with acute sialoadenitis.  [AE]    Clinical Course User Index [AE] Gladys Damme, PA-C    IMPRESSION / MDM / ASSESSMENT AND PLAN / ED COURSE  I reviewed the triage vital signs and the nursing notes.  Differential diagnosis includes, but is not limited to, sialoadenitis, sialolithiasis, cellulitis, abscess  Patient's presentation is most consistent with acute  complicated illness / injury requiring diagnostic workup.   Patient's diagnosis is consistent with sialoadenitis. I independently reviewed and interpreted imaging and agree with radiologists findings. Labs are  reassuring. I did review the patient's allergies and medications.The patient is in stable and satisfactory condition for discharge home  Patient will be discharged home with prescriptions for Augmentin, ibuprofen. Patient is to follow up with PCP as needed or otherwise directed. Patient is given ED precautions to return to the ED for any worsening or new symptoms. Discussed plan of care with patient, answered all of patient's questions, Patient agreeable to plan of care. Advised patient to take medications according to the instructions on the label. Discussed possible side effects of new medications. Patient verbalized understanding.    FINAL CLINICAL IMPRESSION(S) / ED DIAGNOSES   Final diagnoses:  Sialoadenitis     Rx / DC Orders   ED Discharge Orders          Ordered    amoxicillin-clavulanate (AUGMENTIN XR) 1000-62.5 MG 12 hr tablet  2 times daily        05/15/23 2308    ibuprofen (ADVIL) 600 MG tablet  Every 8 hours PRN        05/15/23 2308             Note:  This document was prepared using Dragon voice recognition software and may include unintentional dictation errors.   Gladys Damme, PA-C 05/15/23 2357    Trinna Post, MD 05/18/23 2325

## 2023-05-15 NOTE — ED Provider Triage Note (Signed)
 Emergency Medicine Provider Triage Evaluation Note  Debra Perez , a 68 y.o. female  was evaluated in triage.  Pt complains of right submandibular and anterior neck mass that is developed over the last 2 days.  Patient has developed a sore painful region to the anterior neck and submandibular region on the right side.  Had a sinus infection 2 weeks ago.  Review of Systems  Positive: Right anterior neck mass Negative: Fevers, chills, difficulty breathing or swallowing  Physical Exam  BP (!) 145/62 (BP Location: Left Arm)   Pulse 77   Temp 98.9 F (37.2 C) (Oral)   Resp 17   Ht 5\' 6"  (1.676 m)   Wt 77.1 kg   SpO2 97%   BMI 27.44 kg/m  Gen:   Awake, no distress   Resp:  Normal effort  MSK:   Moves extremities without difficulty  Other:  Right submandibular tender lesion  Medical Decision Making  Medically screening exam initiated at 3:53 PM.  Appropriate orders placed.  Alaria Touchet was informed that the remainder of the evaluation will be completed by another provider, this initial triage assessment does not replace that evaluation, and the importance of remaining in the ED until their evaluation is complete.  Patient with right anterior neck and right submandibular lesion with edema.  Suspect sialoadenitis.  Will order basic labs and CT scan.   Racheal Patches, PA-C 05/15/23 1555

## 2023-05-15 NOTE — ED Triage Notes (Signed)
 Pt comes with neck and jaw pain that started yesterday., pt states it has gotten more painful and feels swollen.

## 2023-05-15 NOTE — ED Notes (Signed)
 Pt states that they started having neck swelling on the right side yesterday. Pt states that it got bigger when they ate lunch and felt discomfort when they were swallowing. Pain is currently a 4/10 but while she was eating it was 7/10. Pt is A&Ox4 and in NAD.

## 2023-05-16 DIAGNOSIS — K1120 Sialoadenitis, unspecified: Secondary | ICD-10-CM | POA: Diagnosis not present

## 2023-05-22 ENCOUNTER — Other Ambulatory Visit: Payer: Self-pay | Admitting: Cardiovascular Disease

## 2023-06-09 ENCOUNTER — Ambulatory Visit: Payer: Self-pay | Admitting: Primary Care

## 2023-06-09 ENCOUNTER — Encounter: Payer: Self-pay | Admitting: Primary Care

## 2023-06-09 VITALS — BP 130/84 | HR 64 | Temp 98.1°F | Ht 66.0 in | Wt 173.0 lb

## 2023-06-09 DIAGNOSIS — J3489 Other specified disorders of nose and nasal sinuses: Secondary | ICD-10-CM

## 2023-06-09 DIAGNOSIS — E1159 Type 2 diabetes mellitus with other circulatory complications: Secondary | ICD-10-CM | POA: Diagnosis not present

## 2023-06-09 DIAGNOSIS — Z7984 Long term (current) use of oral hypoglycemic drugs: Secondary | ICD-10-CM | POA: Diagnosis not present

## 2023-06-09 LAB — POCT GLYCOSYLATED HEMOGLOBIN (HGB A1C): Hemoglobin A1C: 6.6 % — AB (ref 4.0–5.6)

## 2023-06-09 LAB — MICROALBUMIN / CREATININE URINE RATIO
Creatinine,U: 37.8 mg/dL
Microalb Creat Ratio: UNDETERMINED mg/g (ref 0.0–30.0)
Microalb, Ur: <0.7 mg/dL

## 2023-06-09 MED ORDER — GENTAMICIN SULFATE 0.1 % EX OINT: 1.0000 | TOPICAL_OINTMENT | Freq: Every day | CUTANEOUS | 0 refills | Status: AC | PRN

## 2023-06-09 NOTE — Assessment & Plan Note (Addendum)
 Improved with A1c 6.6%  Continue current medications of glipizide XR 5mg  daily and janumet 50-1000mg  twice daily   Discussed hypoglycemia treatment  She will update if increased frequency  Eye exam: UTD Foot exam: UTD   Lipids reviewed: LDL 46 (12/2022)  Urine microalbumin: due today, results pending.   Follow up in 6 months  I evaluated patient, was consulted regarding treatment, and agree with assessment and plan per Julaine Fusi, MSN, FNP student.   Mayra Reel, NP-C

## 2023-06-09 NOTE — Progress Notes (Signed)
 Subjective:    Patient ID: Debra Perez, female    DOB: Aug 06, 1955, 68 y.o.   MRN: [id number]  HPI  Debra Perez is a very pleasant 68 y.o. female with a history of type 2 diabetes, paroxysmal atrial fibrillation, CAD, hypertension, hypothyroidism, hyperlipidemia who presents today for follow up of diabetes.  Current medications include: glipizide XL 5 mg daily, sitagliptin-metformin 50-1000 mg ID  She is checking her blood glucose 1 times daily and is getting readings of:  AM fasting 140-160 Hypoglycemia: 1-3 weeks, 50s.   Last A1C: 7.0 in October 2024,  Last Eye Exam: UTD Last Foot Exam: UTD Pneumonia Vaccination: 2021 Urine Microalbumin: Due Statin: rosuvastatin   Dietary changes since last visit: Increased stress eating over the last few months due to stress from her husband's health. Trying to increase veggies and make healthier choices.    Exercise: None  BP Readings from Last 3 Encounters:  06/09/23 130/84  05/15/23 (!) 122/57  04/13/23 (!) 145/83       Review of Systems  Respiratory:  Negative for shortness of breath.   Cardiovascular:  Negative for chest pain.  Endocrine: Negative for polydipsia, polyphagia and polyuria.  Neurological:  Negative for numbness.         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  On Eliquis; b. CHADS2VASc => 3 (HTN, DM, female)   Skin ulcer of left ankle, limited to breakdown of skin (HCC) 04/02/2020   Sleep apnea     Social History   Socioeconomic History    Marital status: Married    Spouse name: Not on file   Number of children: Not on file   Years of education: Not on file   Highest education level: Some college, no degree  Occupational History   Occupation: retired  Tobacco Use   Smoking status: Former    Current packs/day: 0.00    Average packs/day: 1 pack/day for 20.0 years (20.0 ttl pk-yrs)    Types: Cigarettes    Start date: 03/07/1984    Quit date: 03/07/2004    Years since quitting: 19.2   Smokeless tobacco: Never  Vaping Use   Vaping status: Never Used  Substance and Sexual Activity   Alcohol use: No    Alcohol/week: 0.0 standard drinks of alcohol   Drug use: No   Sexual activity: Not on file  Other Topics Concern   Not on file  Social History Narrative   Married.   1 daughter.   Moved here from [place] due to husbands occupation.   She is a Public house manager.   Enjoys sewing, gardening.   Social Drivers of Corporate investment banker Strain: Low Risk  (06/05/2023)   Overall Financial Resource Strain (CARDIA)    Difficulty of Paying Living Expenses: Not hard at all  Food Insecurity: No Food Insecurity (06/05/2023)   Hunger Vital Sign    Worried About Running Out of Food in the Last Year:  Never true    Ran Out of Food in the Last Year: Never true  Transportation Needs: No Transportation Needs (06/05/2023)   PRAPARE - Administrator, Civil Service (Medical): No    Lack of Transportation (Non-Medical): No  Physical Activity: Sufficiently Active (06/05/2023)   Exercise Vital Sign    Days of Exercise per Week: 4 days    Minutes of Exercise per Session: 40 min  Stress: No Stress Concern Present (06/05/2023)   [employer] of Occupational Health - Occupational Stress Questionnaire    Feeling of Stress : Not at all  Social Connections: Socially Integrated (06/05/2023)   Social Connection and Isolation Panel [NHANES]    Frequency of Communication with Friends and Family: More than three times a week    Frequency of  Social Gatherings with Friends and Family: Twice a week    Attends Religious Services: More than 4 times per year    Active Member of Clubs or Organizations: Yes    Attends Engineer, structural: More than 4 times per year    Marital Status: Married  Catering manager Violence: Not on file    Past Surgical History:  Procedure Laterality Date   ABDOMINAL HYSTERECTOMY  03/07/2000   ovaries remain   CLAVICLE SURGERY  03/07/2005   right clavicle plate pins   COLONOSCOPY WITH PROPOFOL N/A 10/17/2017   Procedure: COLONOSCOPY WITH PROPOFOL;  Surgeon: [name], MD;  Location: [facility] ENDOSCOPY;  Service: Gastroenterology;  Laterality: N/A;   COLONOSCOPY WITH PROPOFOL N/A 01/30/2023   Procedure: COLONOSCOPY WITH PROPOFOL;  Surgeon: [name], MD;  Location: [facility] ENDOSCOPY;  Service: Gastroenterology;  Laterality: N/A;   ESOPHAGOGASTRODUODENOSCOPY (EGD) WITH PROPOFOL N/A 10/17/2017   Procedure: ESOPHAGOGASTRODUODENOSCOPY (EGD) WITH PROPOFOL;  Surgeon: [name], MD;  Location: [facility] ENDOSCOPY;  Service: Gastroenterology;  Laterality: N/A;   GIVENS CAPSULE STUDY N/A 11/30/2017   Procedure: GIVENS CAPSULE STUDY;  Surgeon: [name], MD;  Location: [facility] ENDOSCOPY;  Service: Gastroenterology;  Laterality: N/A;   HEMOSTASIS CLIP PLACEMENT  01/30/2023   Procedure: HEMOSTASIS CLIP PLACEMENT;  Surgeon: [name], MD;  Location: [facility] ENDOSCOPY;  Service: Gastroenterology;;   KNEE ARTHROSCOPY  03/07/2010   Left foot surgery Left 03/03/2022   POLYPECTOMY  01/30/2023   Procedure: POLYPECTOMY;  Surgeon: [name], MD;  Location: [facility] ENDOSCOPY;  Service: Gastroenterology;;    Family History  Problem Relation Age of Onset   Depression Mother    Sudden death Mother 35   Heart attack Mother    Arthritis Father    Heart disease Father    Hypertension Father    Emphysema Father    Bladder Cancer Sister    Diabetes Sister        Type 2   Diabetes Brother        type   Hypertension  Brother    Emphysema Brother    Heart disease Brother    Diabetes Maternal Grandfather    Bladder Cancer Maternal Grandmother        sinus cancer    Breast cancer Paternal Grandmother    Cancer Paternal Grandfather        colon cancer   Diabetes Paternal Grandfather     Allergies  Allergen Reactions   Albuterol    Sulfa Antibiotics Nausea And Vomiting    Current Outpatient Medications on File Prior to Visit  Medication Sig Dispense Refill   Accu-Chek FastClix Lancets MISC USE TO TEST BLOOD SUGAR TWICE DAILY AS DIRECTED 204 each 1  [name] 50 MG tablet TAKE 1 TABLET BY MOUTH AT BEDTIME FOR BACK PAIN 90 tablet 2   aspirin EC 81 MG tablet Take 81 mg by mouth daily. Swallow whole.     b complex vitamins capsule Take 1 capsule by mouth 2 (two) times daily.     Continuous Glucose Sensor (FREESTYLE LIBRE 3 SENSOR) MISC PLACE 1 SENSOR ON THE SKIN EVERY 14 DAYS. USE TO CHECK GLUCOSE CONTINUOUSLY 6 each 3   ezetimibe (ZETIA) 10 MG tablet TAKE 1 TABLET BY MOUTH DAILY 90 tablet 0   famotidine (PEPCID) 20 MG tablet Take 1 tablet (20 mg total) by mouth 2 (two) times daily. For heartburn. 180 tablet 1   glipiZIDE (GLUCOTROL XL) 5 MG 24 hr tablet Take 1 tablet (5 mg total) by mouth daily with breakfast. for diabetes. 90 tablet 0   glucose blood (CONTOUR NEXT TEST) test strip Use to check blood sugar up to three times daily 300 each 1   hydrochlorothiazide (HYDRODIURIL) 25 MG tablet TAKE 1 TABLET DAILY 90 tablet 3   ipratropium (ATROVENT HFA) 17 MCG/ACT inhaler Inhale 2 puffs into the lungs every 4 (four) hours as needed for wheezing. 1 each 0   ipratropium (ATROVENT) 0.03 % nasal spray Place 2 sprays into both nostrils every 12 (twelve) hours. 30 mL 0   Iron-Vitamin C 65-125 MG TABS Take by mouth daily.      Magnesium 500 MG CAPS Take 1 capsule by mouth 2 (two) times daily.     methocarbamol (ROBAXIN) 750 MG tablet Take 375-750 mg by mouth at bedtime as needed.     metoprolol  succinate (TOPROL-XL) 25 MG 24 hr tablet TAKE 1 TABLET TWICE A DAY WITH OR IMMEDIATELY FOLLOWING A MEAL 180 tablet 3   metoprolol tartrate (LOPRESSOR) 25 MG tablet Take 0.5 tablet (12.5 mg) by mouth once daily as needed for palpitations     Multiple Vitamins-Minerals (MULTIVITAMIN ADULT PO) Take 1 capsule by mouth daily.      propafenone (RYTHMOL SR) 225 MG 12 hr capsule TAKE 1 CAPSULE BY MOUTH 2 TIMES DAILY 180 capsule 1   rosuvastatin (CRESTOR) 5 MG tablet TAKE 1 TABLET BY MOUTH DAILY GENERIC EQUIVALENT FOR CRESTOR 90 tablet 3   sitaGLIPtin-metformin (JANUMET) 50-1000 MG tablet TAKE 1 TABLET TWICE A DAY WITH MEALS FOR DIABETES 180 tablet 1   SYNTHROID 88 MCG tablet Take 1 tablet by mouth every morning on an empty stomach with water only.  No food or other medications for 30 minutes. 90 tablet 2   verapamil (CALAN-SR) 120 MG CR tablet Take 1 tablet (120 mg total) by mouth daily. 90 tablet 3   losartan (COZAAR) 50 MG tablet Take 1 tablet (50 mg total) by mouth daily. 90 tablet 3   Current Facility-Administered Medications on File Prior to Visit  Medication Dose Route Frequency Provider Last Rate Last Admin   heparin lock flush 100 unit/mL  500 Units Intracatheter Once PRN Rickard Patience, MD       sodium chloride flush (NS) 0.9 % injection 10 mL  10 mL Intracatheter PRN Rickard Patience, MD   10 mL at 10/06/17 1515    BP 130/84 (BP Location: Left Arm, Patient Position: Sitting, Cuff Size: Large)   Pulse 64   Temp 98.1 F (36.7 C) (Oral)   Ht 5\' 6"  (1.676 m)   Wt 173 lb (78.5 kg)   SpO2 95%   BMI 27.92 kg/m  Objective:   Physical Exam Cardiovascular:  Rate and Rhythm: Normal rate and regular rhythm.  Pulmonary:     Effort: Pulmonary effort is normal.     Breath sounds: Normal breath sounds.  Musculoskeletal:     Cervical back: Neck supple.  Skin:    General: Skin is warm and dry.  Neurological:     Mental Status: She is alert and oriented to person, place, and time.  Psychiatric:         Mood and Affect: Mood normal.           Assessment & Plan:  Type 2 diabetes mellitus with other circulatory complication, without long-term current use of insulin (HCC) Assessment & Plan: Improved with A1c 6.6%  Continue current medications of glipizide XR 5mg  daily and janumet 50-1000mg  twice daily   Discussed hypoglycemia treatment  She will update if increased frequency  Eye exam: UTD Foot exam: UTD   Lipids reviewed: LDL 46 (12/2022)  Urine microalbumin: due today, results pending.   Follow up in 6 months  Orders: -     POCT glycosylated hemoglobin (Hb A1C) -     Microalbumin / creatinine urine ratio  Nasal cavity mass -     Gentamicin Sulfate; Apply 1 Application topically daily as needed. For nasal sore.  Dispense: 15 g; Refill: 0        Doreene Nest, NP

## 2023-06-09 NOTE — Patient Instructions (Signed)
 Medications: continue current meds  Labs: urine microalbumin today  Follow up: 6 months

## 2023-06-09 NOTE — Progress Notes (Signed)
 Established Patient Office Visit  Subjective   Patient ID: Debra Perez, female    DOB: 1955-07-19  Age: 68 y.o. MRN: [id number]  Chief Complaint  Patient presents with   6 Month Diabetes Follow-up    Has been under a lot of stress due to husband's health. Admits to stress eating.     HPI  Debra Perez is a 68 year old female with history of essential hypertension, PAF, NSVT, CAD, mitral valve disorder, GERD, type 2 diabetes mellitus, hypothyroidism, osteopenia, hyperlipidemia, iron deficiency anemia who presents for routine diabetes follow up.   Denies polyuria, polyphagia, polydipsia. Denies chest pain and shortness of breath. Has occasional palpitations. Denies nausea, vomiting, diarrhea, constipation. Denies numbness and tingling to bilateral lower extremities.    Current medications include:  Glipizide XR 5mg   Janumet 50-1000mg  twice daily   She is checking her blood glucose 1 time daily and is getting readings of: AM fasting - 140-160  Notes hypoglycemic episodes - drinks coke for symptomatic relief  - Occurs once every 2-3 weeks  - Lowest 56-75  Last A1C: 7.0 (12/2022) Last Eye Exam: UTD Last Foot Exam UTD Pneumonia Vaccination: completed 2021 Urine Microalbumin: due today Statin: crestor 5mg    Dietary changes since last visit: Breakfast: greek yogurt, granola Lunch: cheese, crackers, apples, peanut butters Dinner: chicken, vegetables  Snacks: cucumbers, celery  Beverages: water  Exercise: yard-work seasonally    Review of Systems  Eyes: Negative.   Respiratory:  Negative for shortness of breath.   Cardiovascular:  Positive for palpitations. Negative for chest pain.  Gastrointestinal:  Negative for constipation, diarrhea, nausea and vomiting.  Genitourinary:  Negative for frequency.  Neurological:  Negative for sensory change.  Endo/Heme/Allergies:  Negative for polydipsia.      Objective:     BP 130/84 (BP Location: Left Arm, Patient Position:  Sitting, Cuff Size: Large)   Pulse 64   Temp 98.1 F (36.7 C) (Oral)   Ht 5\' 6"  (1.676 m)   Wt 78.5 kg   SpO2 95%   BMI 27.92 kg/m    Physical Exam Constitutional:      Appearance: Normal appearance. She is normal weight.  Cardiovascular:     Rate and Rhythm: Normal rate and regular rhythm.     Pulses:          Radial pulses are 2+ on the right side and 2+ on the left side.       Dorsalis pedis pulses are 2+ on the right side and 2+ on the left side.     Heart sounds: Normal heart sounds.  Pulmonary:     Effort: Pulmonary effort is normal. No respiratory distress.     Breath sounds: Normal breath sounds.  Musculoskeletal:     Right lower leg: No edema.     Left lower leg: No edema.  Skin:    General: Skin is warm and dry.  Neurological:     Mental Status: She is alert and oriented to person, place, and time.  Psychiatric:        Mood and Affect: Mood normal.        Behavior: Behavior normal.        Thought Content: Thought content normal.      Results for orders placed or performed in visit on 06/09/23  POCT glycosylated hemoglobin (Hb A1C)  Result Value Ref Range   Hemoglobin A1C 6.6 (A) 4.0 - 5.6 %   HbA1c POC (<> result, manual entry)     HbA1c, POC (prediabetic range)  HbA1c, POC (controlled diabetic range)        The ASCVD Risk score (Arnett DK, et al., 2019) failed to calculate for the following reasons:   The valid total cholesterol range is 130 to 320 mg/dL    Assessment & Plan:   Problem List Items Addressed This Visit       Endocrine   Type 2 diabetes mellitus (HCC) - Primary   Improved with A1c 6.6%  Continue current medications of glipizide XR 5mg  daily and janumet 50-1000mg  twice daily   Discussed hypoglycemia treatment  She will update if increased frequency  Eye exam: UTD Foot exam: UTD   Lipids reviewed: LDL 46 (12/2022)  Urine microalbumin: due today, results pending.   Follow up in 6 months      Relevant Orders   POCT  glycosylated hemoglobin (Hb A1C) (Completed)   Microalbumin/Creatinine Ratio, Urine   Other Visit Diagnoses       Nasal cavity mass       Relevant Medications   gentamicin ointment (GARAMYCIN) 0.1 %      Follow up in October 2025    Lindell Spar, RN

## 2023-06-10 ENCOUNTER — Other Ambulatory Visit: Payer: Self-pay | Admitting: Cardiovascular Disease

## 2023-06-10 DIAGNOSIS — E782 Mixed hyperlipidemia: Secondary | ICD-10-CM

## 2023-06-17 ENCOUNTER — Other Ambulatory Visit: Payer: Self-pay | Admitting: Primary Care

## 2023-06-17 DIAGNOSIS — E1159 Type 2 diabetes mellitus with other circulatory complications: Secondary | ICD-10-CM

## 2023-07-29 ENCOUNTER — Other Ambulatory Visit: Payer: Self-pay | Admitting: Primary Care

## 2023-07-29 DIAGNOSIS — E1159 Type 2 diabetes mellitus with other circulatory complications: Secondary | ICD-10-CM

## 2023-08-09 ENCOUNTER — Ambulatory Visit: Payer: Self-pay

## 2023-08-09 NOTE — Telephone Encounter (Signed)
 Patient was last seen in primary care office on 06/09/23. Called Nurse Triage reporting LLQ pain. Symptoms began yesterday. Symptoms are worsening (improving, worsening, stable)   Triage Disposition: See physician within 24 hours   Patient/caregiver understands and will follow disposition:  CALL BACK IF: * Severe pain lasts over 1 hour * Constant pain lasts over 2 hours * You become worse. DIET: * Drink adequate fluids. Eat a bland diet. * Avoid alcohol or caffeinated beverages * Avoid greasy or fatty foods.   Copied from CRM [id number]. Topic: Clinical - Red Word Triage >> Aug 09, 2023  8:16 AM Caliyah H wrote: [facility] that prompted transfer to Nurse Triage: Patient called reporting a diverticulitis flare-up. She stated she is experiencing pain but is unable to clearly describe. Reason for Disposition  [1] MODERATE pain (e.g., interferes with normal activities) AND [2] pain comes and goes (cramps) AND [3] present > 24 hours  (Exception: Pain with Vomiting or Diarrhea - see that Guideline.)  Answer Assessment - Initial Assessment Questions 1. LOCATION: "Where does it hurt?"      LLQ 2. RADIATION: "Does the pain shoot anywhere else?" (e.g., chest, back)     denies 3. ONSET: "When did the pain begin?" (e.g., minutes, hours or days ago)      yesterday 4. SUDDEN: "Gradual or sudden onset?"     gradual 5. PATTERN "Does the pain come and go, or is it constant?"    - If it comes and goes: "How long does it last?" "Do you have pain now?"     (Note: Comes and goes means the pain is intermittent. It goes away completely between bouts.)    - If constant: "Is it getting better, staying the same, or getting worse?"      (Note: Constant means the pain never goes away completely; most serious pain is constant and gets worse.)      intermittent 6. SEVERITY: "How bad is the pain?"  (e.g., Scale 1-10; mild, moderate, or severe)    - MILD (1-3): Doesn't interfere with normal activities, abdomen soft and not  tender to touch.     - MODERATE (4-7): Interferes with normal activities or awakens from sleep, abdomen tender to touch.     - SEVERE (8-10): Excruciating pain, doubled over, unable to do any normal activities.       5 7. RECURRENT SYMPTOM: "Have you ever had this type of stomach pain before?" If Yes, ask: "When was the last time?" and "What happened that time?"      diverticulitis 8. CAUSE: "What do you think is causing the stomach pain?"     diverticulitis 9. RELIEVING/AGGRAVATING FACTORS: "What makes it better or worse?" (e.g., antacids, bending or twisting motion, bowel movement)     Holding still is best 10. OTHER SYMPTOMS: "Do you have any other symptoms?" (e.g., back pain, diarrhea, fever, urination pain, vomiting)       Loose stool,  Protocols used: Abdominal Pain - Female-A-AH

## 2023-08-09 NOTE — Telephone Encounter (Signed)
 Noted, will evaluate.

## 2023-08-10 ENCOUNTER — Ambulatory Visit (INDEPENDENT_AMBULATORY_CARE_PROVIDER_SITE_OTHER): Admitting: Primary Care

## 2023-08-10 ENCOUNTER — Encounter: Payer: Self-pay | Admitting: Primary Care

## 2023-08-10 VITALS — BP 132/80 | HR 69 | Temp 97.3°F | Ht 66.0 in | Wt 169.0 lb

## 2023-08-10 DIAGNOSIS — R1032 Left lower quadrant pain: Secondary | ICD-10-CM | POA: Diagnosis not present

## 2023-08-10 DIAGNOSIS — M2540 Effusion, unspecified joint: Secondary | ICD-10-CM | POA: Insufficient documentation

## 2023-08-10 DIAGNOSIS — E2839 Other primary ovarian failure: Secondary | ICD-10-CM

## 2023-08-10 LAB — CBC WITH DIFFERENTIAL/PLATELET
Basophils Absolute: 0 10*3/uL (ref 0.0–0.1)
Basophils Relative: 0.5 % (ref 0.0–3.0)
Eosinophils Absolute: 0.2 10*3/uL (ref 0.0–0.7)
Eosinophils Relative: 2.2 % (ref 0.0–5.0)
HCT: 38.9 % (ref 36.0–46.0)
Hemoglobin: 13.2 g/dL (ref 12.0–15.0)
Lymphocytes Relative: 20.7 % (ref 12.0–46.0)
Lymphs Abs: 1.8 10*3/uL (ref 0.7–4.0)
MCHC: 34 g/dL (ref 30.0–36.0)
MCV: 90.2 fl (ref 78.0–100.0)
Monocytes Absolute: 0.7 10*3/uL (ref 0.1–1.0)
Monocytes Relative: 7.9 % (ref 3.0–12.0)
Neutro Abs: 5.8 10*3/uL (ref 1.4–7.7)
Neutrophils Relative %: 68.7 % (ref 43.0–77.0)
Platelets: 286 10*3/uL (ref 150.0–400.0)
RBC: 4.31 Mil/uL (ref 3.87–5.11)
RDW: 13.8 % (ref 11.5–15.5)
WBC: 8.4 10*3/uL (ref 4.0–10.5)

## 2023-08-10 MED ORDER — AMOXICILLIN-POT CLAVULANATE 875-125 MG PO TABS: 1.0000 | ORAL_TABLET | Freq: Two times a day (BID) | ORAL | 0 refills | Status: DC

## 2023-08-10 MED ORDER — INDOMETHACIN 50 MG PO CAPS: 50.0000 mg | ORAL_CAPSULE | Freq: Three times a day (TID) | ORAL | 0 refills | Status: DC

## 2023-08-10 NOTE — Assessment & Plan Note (Signed)
 Differentials include osteoarthritis versus gout. Reviewed labs from orthopedics through [facility] from June 2025.  While uric acid appears slightly elevated her presentation does not match acute gout.  Regardless, will trial indomethacin 50 mg to 3 times daily x 1 week as needed. She declines prednisone  and there is a drug interaction with her verapamil  and colchicine.  Repeat uric acid level in the near future.  She will update regarding symptoms.

## 2023-08-10 NOTE — Progress Notes (Signed)
 Subjective:    Patient ID: Debra Perez, female    DOB: Jul 26, 1955, 68 y.o.   MRN: [id number]  Abdominal Pain Associated symptoms include arthralgias, diarrhea and a fever.    Debra Perez is a very pleasant 68 y.o. female with a history of hypertension, paroxysmal atrial fibrillation, CAD, hyperlipidemia, chest pain, GERD, sigmoid diverticulosis with acute diverticulitis who presents today to discuss abdominal pain and joint pain.  Symptom onset six days ago with bilateral lower back pain with radiation to her groin. Also with left lower abdominal tenderness. Yesterday she noticed increased pain to the LLQ with symptoms of fevers such as sweats and chills. She's been taking Tylenol /Ibuprofen  with improvement in fever symtpoms. Today she's developed diarrhea without blood, continued abdominal pain.   She did a clear liquid diet yesterday except for meatloaf in the afternoon.   She saw her orthopedist on 08/07/23 who checked labs which showed positive uric acid level of 6.7. 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   She is also needing a bone density scan.   Review of Systems  Constitutional:  Positive for chills and fever.  Gastrointestinal:  Positive for abdominal pain and diarrhea.  Musculoskeletal:  Positive for arthralgias and joint swelling.  Skin:  Negative for color change.         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  On Eliquis ; b. CHADS2VASc => 3  (HTN, DM, female)   Skin ulcer of left ankle, limited to breakdown of skin (HCC) 04/02/2020   Sleep apnea     Social History   Socioeconomic History   Marital status: Married    Spouse name: Not on file   Number of children: Not on file   Years of education: Not on file   Highest education level: Some college, no degree  Occupational History   Occupation: retired  Tobacco Use   Smoking status: Former    Current packs/day: 0.00    Average packs/day: 1 pack/day for 20.0 years (20.0 ttl pk-yrs)    Types: Cigarettes    Start date: 03/07/1984    Quit date: 03/07/2004    Years since quitting: 19.4   Smokeless tobacco: Never  Vaping Use   Vaping status: Never Used  Substance and Sexual Activity   Alcohol use: No    Alcohol/week: 0.0 standard drinks of alcohol   Drug use: No   Sexual activity: Not on file  Other Topics Concern   Not on file  Social History Narrative   Married.   1 daughter.   Moved here from [place]  due to husbands occupation.   She is a Public house manager.   Enjoys sewing, gardening.   Social Drivers of Corporate investment banker Strain: Low Risk  (06/05/2023)   Overall Financial Resource Strain (CARDIA)    Difficulty of Paying Living Expenses: Not hard at all  Food Insecurity:  No Food Insecurity (06/05/2023)   Hunger Vital Sign    Worried About Running Out of Food in the Last Year: Never true    Ran Out of Food in the Last Year: Never true  Transportation Needs: No Transportation Needs (06/05/2023)   PRAPARE - Administrator, Civil Service (Medical): No    Lack of Transportation (Non-Medical): No  Physical Activity: Sufficiently Active (06/05/2023)   Exercise Vital Sign    Days of Exercise per Week: 4 days    Minutes of Exercise per Session: 40 min  Stress: No Stress Concern Present (06/05/2023)   [employer] of Occupational Health - Occupational Stress Questionnaire    Feeling of Stress : Not at all  Social Connections: Socially Integrated (06/05/2023)    Social Connection and Isolation Panel [NHANES]    Frequency of Communication with Friends and Family: More than three times a week    Frequency of Social Gatherings with Friends and Family: Twice a week    Attends Religious Services: More than 4 times per year    Active Member of Clubs or Organizations: Yes    Attends Engineer, structural: More than 4 times per year    Marital Status: Married  Catering manager Violence: Not on file    Past Surgical History:  Procedure Laterality Date   ABDOMINAL HYSTERECTOMY  03/07/2000   ovaries remain   CLAVICLE SURGERY  03/07/2005   right clavicle plate pins   COLONOSCOPY WITH PROPOFOL  N/A 10/17/2017   Procedure: COLONOSCOPY WITH PROPOFOL ;  Surgeon: Luke Salaam, MD;  Location: [facility] ENDOSCOPY;  Service: Gastroenterology;  Laterality: N/A;   COLONOSCOPY WITH PROPOFOL  N/A 01/30/2023   Procedure: COLONOSCOPY WITH PROPOFOL ;  Surgeon: Luke Salaam, MD;  Location: [facility] ENDOSCOPY;  Service: Gastroenterology;  Laterality: N/A;   ESOPHAGOGASTRODUODENOSCOPY (EGD) WITH PROPOFOL  N/A 10/17/2017   Procedure: ESOPHAGOGASTRODUODENOSCOPY (EGD) WITH PROPOFOL ;  Surgeon: Luke Salaam, MD;  Location: [facility] ENDOSCOPY;  Service: Gastroenterology;  Laterality: N/A;   GIVENS CAPSULE STUDY N/A 11/30/2017   Procedure: GIVENS CAPSULE STUDY;  Surgeon: Luke Salaam, MD;  Location: [facility] ENDOSCOPY;  Service: Gastroenterology;  Laterality: N/A;   HEMOSTASIS CLIP PLACEMENT  01/30/2023   Procedure: HEMOSTASIS CLIP PLACEMENT;  Surgeon: Luke Salaam, MD;  Location: [facility] ENDOSCOPY;  Service: Gastroenterology;;   KNEE ARTHROSCOPY  03/07/2010   Left foot surgery Left 03/03/2022   POLYPECTOMY  01/30/2023   Procedure: POLYPECTOMY;  Surgeon: Luke Salaam, MD;  Location: [facility] ENDOSCOPY;  Service: Gastroenterology;;    Family History  Problem Relation Age of Onset   Depression Mother    Sudden death Mother 65   Heart attack Mother    Arthritis Father    Heart disease Father     Hypertension Father    Emphysema Father    Bladder Cancer Sister    Diabetes Sister        Type 2   Diabetes Brother        type   Hypertension Brother    Emphysema Brother    Heart disease Brother    Diabetes Maternal Grandfather    Bladder Cancer Maternal Grandmother        sinus cancer    Breast cancer Paternal Grandmother    Cancer Paternal Grandfather        colon cancer   Diabetes Paternal Grandfather     Allergies  Allergen Reactions   Albuterol     Sulfa  Antibiotics Nausea And Vomiting    Current Outpatient Medications on File Prior to Visit  Medication  Sig Dispense Refill   Accu-Chek FastClix Lancets MISC USE TO TEST BLOOD SUGAR TWICE DAILY AS DIRECTED 204 each 1   amitriptyline  (ELAVIL ) 50 MG tablet TAKE 1 TABLET BY MOUTH AT BEDTIME FOR BACK PAIN 90 tablet 2   aspirin  EC 81 MG tablet Take 81 mg by mouth daily. Swallow whole.     b complex vitamins capsule Take 1 capsule by mouth 2 (two) times daily.     Continuous Glucose Sensor (FREESTYLE LIBRE 3 SENSOR) MISC PLACE 1 SENSOR ON THE SKIN EVERY 14 DAYS. USE TO CHECK GLUCOSE CONTINUOUSLY 6 each 3   ezetimibe  (ZETIA ) 10 MG tablet TAKE 1 TABLET BY MOUTH DAILY 90 tablet 3   famotidine  (PEPCID ) 20 MG tablet Take 1 tablet (20 mg total) by mouth 2 (two) times daily. For heartburn. 180 tablet 1   gentamicin  ointment (GARAMYCIN ) 0.1 % Apply 1 Application topically daily as needed. For nasal sore. 15 g 0   glipiZIDE  (GLUCOTROL  XL) 5 MG 24 hr tablet TAKE 1 TABLET DAILY WITH BREAKFAST FOR DIABETES 90 tablet 1   glucose blood (CONTOUR NEXT TEST) test strip Use to check blood sugar up to three times daily 300 each 1   hydrochlorothiazide  (HYDRODIURIL ) 25 MG tablet TAKE 1 TABLET DAILY 90 tablet 3   ipratropium (ATROVENT  HFA) 17 MCG/ACT inhaler Inhale 2 puffs into the lungs every 4 (four) hours as needed for wheezing. 1 each 0   ipratropium (ATROVENT ) 0.03 % nasal spray Place 2 sprays into both nostrils every 12 (twelve) hours. 30 mL 0    Iron -Vitamin C 65-125 MG TABS Take by mouth daily.      Magnesium  500 MG CAPS Take 1 capsule by mouth 2 (two) times daily.     methocarbamol  (ROBAXIN ) 750 MG tablet Take 375-750 mg by mouth at bedtime as needed.     metoprolol  succinate (TOPROL -XL) 25 MG 24 hr tablet TAKE 1 TABLET TWICE A DAY WITH OR IMMEDIATELY FOLLOWING A MEAL 180 tablet 3   metoprolol  tartrate (LOPRESSOR ) 25 MG tablet Take 0.5 tablet (12.5 mg) by mouth once daily as needed for palpitations     Multiple Vitamins-Minerals (MULTIVITAMIN ADULT PO) Take 1 capsule by mouth daily.      propafenone  (RYTHMOL  SR) 225 MG 12 hr capsule TAKE 1 CAPSULE BY MOUTH 2 TIMES DAILY 180 capsule 1   rosuvastatin  (CRESTOR ) 5 MG tablet TAKE 1 TABLET BY MOUTH DAILY GENERIC EQUIVALENT FOR CRESTOR  90 tablet 3   sitaGLIPtin -metformin  (JANUMET ) 50-1000 MG tablet TAKE 1 TABLET TWICE A DAY WITH MEALS FOR DIABETES 180 tablet 1   SYNTHROID  88 MCG tablet Take 1 tablet by mouth every morning on an empty stomach with water only.  No food or other medications for 30 minutes. 90 tablet 2   verapamil  (CALAN -SR) 120 MG CR tablet Take 1 tablet (120 mg total) by mouth daily. 90 tablet 3   losartan  (COZAAR ) 50 MG tablet Take 1 tablet (50 mg total) by mouth daily. 90 tablet 3   Current Facility-Administered Medications on File Prior to Visit  Medication Dose Route Frequency Provider Last Rate Last Admin   heparin  lock flush 100 unit/mL  500 Units Intracatheter Once PRN Timmy Forbes, MD       sodium chloride  flush (NS) 0.9 % injection 10 mL  10 mL Intracatheter PRN Timmy Forbes, MD   10 mL at 10/06/17 1515    BP 132/80   Pulse 69   Temp (!) 97.3 F (36.3 C) (Temporal)   Ht 5\' 6"  (  1.676 m)   Wt 169 lb (76.7 kg)   SpO2 97%   BMI 27.28 kg/m  Objective:   Physical Exam Constitutional:      Appearance: She is not ill-appearing.  Cardiovascular:     Rate and Rhythm: Normal rate and regular rhythm.  Pulmonary:     Effort: Pulmonary effort is normal.     Breath sounds:  Normal breath sounds.  Abdominal:     Tenderness: There is abdominal tenderness in the left upper quadrant and left lower quadrant.  Musculoskeletal:     Left hand: Swelling present. Decreased range of motion.     Cervical back: Neck supple.     Comments: Mild swelling to multiple joints on bilateral hands, 2nd MCP joint large.  Skin:    General: Skin is warm and dry.  Neurological:     Mental Status: She is alert and oriented to person, place, and time.  Psychiatric:        Mood and Affect: Mood normal.           Assessment & Plan:  LLQ abdominal pain Assessment & Plan: Symptoms suggestive of acute diverticulitis. Reviewed colonoscopy from 2024 which does reveal sigmoid diverticulosis.  Treat with Augmentin  875-125 milligrams twice daily x 10 days.  She cannot tolerate Flagyl .  We discussed to start a clear liquid diet, advance diet slowly as pain improves. CBC with differential and BMP pending.  She will update regarding symptoms in a few days. ED precautions provided.  Orders: -     Amoxicillin -Pot Clavulanate; Take 1 tablet by mouth 2 (two) times daily.  Dispense: 20 tablet; Refill: 0 -     Basic metabolic panel with GFR -     CBC with Differential/Platelet  Joint swelling Assessment & Plan: Differentials include osteoarthritis versus gout. Reviewed labs from orthopedics through [facility] from June 2025.  While uric acid appears slightly elevated her presentation does not match acute gout.  Regardless, will trial indomethacin 50 mg to 3 times daily x 1 week as needed. She declines prednisone  and there is a drug interaction with her verapamil  and colchicine.  Repeat uric acid level in the near future.  She will update regarding symptoms.  Orders: -     Indomethacin; Take 1 capsule (50 mg total) by mouth 3 (three) times daily with meals. As needed for joint swelling.  Dispense: 30 capsule; Refill: 0  Estrogen deficiency -     DG Bone Density;  Future        Brinley Treanor K Tamilyn Lupien, NP

## 2023-08-10 NOTE — Assessment & Plan Note (Signed)
 Symptoms suggestive of acute diverticulitis. Reviewed colonoscopy from 2024 which does reveal sigmoid diverticulosis.  Treat with Augmentin  875-125 milligrams twice daily x 10 days.  She cannot tolerate Flagyl .  We discussed to start a clear liquid diet, advance diet slowly as pain improves. CBC with differential and BMP pending.  She will update regarding symptoms in a few days. ED precautions provided.

## 2023-08-10 NOTE — Patient Instructions (Signed)
 Start Augmentin  antibiotics. Take 1 tablet by mouth twice daily for 7 days.  You may take indomethacin 3 times daily with food x 1 week for potential gout flare.  Stop by the lab prior to leaving today. I will notify you of your results once received.   Call the breast center to schedule your bone density scan.  Please update me early next week.  It was a pleasure to see you today!

## 2023-08-11 ENCOUNTER — Telehealth: Payer: Self-pay

## 2023-08-11 ENCOUNTER — Encounter: Payer: Self-pay | Admitting: Oncology

## 2023-08-11 ENCOUNTER — Ambulatory Visit: Payer: Self-pay | Admitting: Primary Care

## 2023-08-11 ENCOUNTER — Other Ambulatory Visit (HOSPITAL_COMMUNITY): Payer: Self-pay

## 2023-08-11 LAB — BASIC METABOLIC PANEL WITH GFR
BUN: 12 mg/dL (ref 6–23)
CO2: 27 meq/L (ref 19–32)
Calcium: 9.6 mg/dL (ref 8.4–10.5)
Chloride: 97 meq/L (ref 96–112)
Creatinine, Ser: 0.75 mg/dL (ref 0.40–1.20)
GFR: 83.52 mL/min (ref >60.00)
Glucose, Bld: 172 mg/dL — ABNORMAL HIGH (ref 70–99)
Potassium: 3.8 meq/L (ref 3.5–5.1)
Sodium: 136 meq/L (ref 135–145)

## 2023-08-11 NOTE — Telephone Encounter (Signed)
 Pharmacy Patient Advocate Encounter   Received notification from Onbase that prior authorization for Indomethacin 50MG  capsules is required/requested.   Insurance verification completed.   The patient is insured through [employer] .   Per test claim: PA required and submitted KEY/EOC/Request #: [id number] from 07/12/23 to 08/10/24. Ran test claim, Copay is $3.39. This test claim was processed through [facility]- copay amounts may vary at other pharmacies due to pharmacy/plan contracts, or as the patient moves through the different stages of their insurance plan.

## 2023-08-17 ENCOUNTER — Other Ambulatory Visit: Payer: Self-pay | Admitting: Cardiovascular Disease

## 2023-09-07 ENCOUNTER — Ambulatory Visit
Admission: RE | Admit: 2023-09-07 | Discharge: 2023-09-07 | Disposition: A | Discharge status: Home / Self Care | Source: Ambulatory Visit | Attending: Primary Care | Admitting: Primary Care

## 2023-09-07 DIAGNOSIS — E2839 Other primary ovarian failure: Secondary | ICD-10-CM | POA: Insufficient documentation

## 2023-09-14 ENCOUNTER — Ambulatory Visit
Admission: EM | Admit: 2023-09-14 | Discharge: 2023-09-14 | Disposition: A | Discharge status: Home / Self Care | Attending: Emergency Medicine | Admitting: Emergency Medicine

## 2023-09-14 DIAGNOSIS — J0100 Acute maxillary sinusitis, unspecified: Secondary | ICD-10-CM | POA: Diagnosis not present

## 2023-09-14 MED ORDER — AMOXICILLIN-POT CLAVULANATE 875-125 MG PO TABS: 1.0000 | ORAL_TABLET | Freq: Two times a day (BID) | ORAL | 0 refills | Status: DC

## 2023-09-14 MED ORDER — GUAIFENESIN-CODEINE 100-10 MG/5ML PO SOLN: 5.0000 mL | Freq: Four times a day (QID) | ORAL | 0 refills | Status: DC | PRN

## 2023-09-14 MED ORDER — PREDNISONE 10 MG (21) PO TBPK: ORAL_TABLET | Freq: Every day | ORAL | 0 refills | Status: DC

## 2023-09-14 NOTE — ED Triage Notes (Signed)
 Pt being seen in UC for sinus congestion, L ear pain, and cough for approximately 1 week. Pt reports having chills, but no reports of fever. Pt denies SOB and CP.   Pt reports taking Claritin D, tylenol , and advil  with no relief.

## 2023-09-14 NOTE — ED Provider Notes (Signed)
 CAY RALPH PELT    CSN: [id number] Arrival date & time: 09/14/23  1241      History   Chief Complaint Chief Complaint  Patient presents with   Cough   Nasal Congestion    HPI Debra Perez is a 68 y.o. female.   Patient presents for evaluation of chills, productive cough with green sputum, nasal congestion, sinus pressure to the cheeks present for 7 days.  As well as experience left-sided ear pain per day.  Has attempted use of Claritin-D, Tylenol  and Advil .  Tolerable to food and liquids.  Denies shortness of breath, wheezing or fever.  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  On Eliquis ; b. CHADS2VASc => 3 (HTN, DM, female)   Skin ulcer of left ankle, limited to breakdown of skin (HCC) 04/02/2020   Sleep apnea     Patient Active Problem List   Diagnosis Date Noted   Joint swelling 08/10/2023   Personal history of colon polyps, unspecified 01/30/2023   Adenomatous polyp of colon 01/30/2023   Sinus pressure 01/05/2022   Chronic neck pain 04/13/2020   Varicose veins of both lower extremities 05/13/2019   LLQ abdominal pain 08/06/2018   Iron  deficiency anemia 10/04/2017   Mitral valve disorder 08/30/2017   Osteopenia 04/19/2017   Arthralgia 11/09/2016   Pleuritic chest pain    Chronic back pain    Coronary artery disease involving native coronary artery of native heart with angina pectoris (HCC)    Atypical chest pain 12/03/2015   SOB (shortness of breath) 08/07/2015   GERD (gastroesophageal reflux  disease) 11/06/2014   NSVT (nonsustained ventricular tachycardia) (HCC) 11/06/2014   Preventative health care 11/05/2014   Paroxysmal atrial fibrillation (HCC) 09/17/2014   Hot flashes 09/17/2014   Type 2 diabetes mellitus (HCC) 08/21/2014   Essential hypertension 08/21/2014   Mixed hyperlipidemia 08/21/2014   Hypothyroidism 08/21/2014    Past Surgical History:  Procedure Laterality Date   ABDOMINAL HYSTERECTOMY  03/07/2000   ovaries remain   CLAVICLE SURGERY  03/07/2005   right clavicle plate pins   COLONOSCOPY WITH PROPOFOL  N/A 10/17/2017   Procedure: COLONOSCOPY WITH PROPOFOL ;  Surgeon: Therisa Bi, MD;  Location: [facility] ENDOSCOPY;  Service: Gastroenterology;  Laterality: N/A;   COLONOSCOPY WITH PROPOFOL  N/A 01/30/2023   Procedure: COLONOSCOPY WITH PROPOFOL ;  Surgeon: Therisa Bi, MD;  Location: [facility] ENDOSCOPY;  Service: Gastroenterology;  Laterality: N/A;   ESOPHAGOGASTRODUODENOSCOPY (EGD) WITH PROPOFOL  N/A 10/17/2017   Procedure: ESOPHAGOGASTRODUODENOSCOPY (EGD) WITH PROPOFOL ;  Surgeon: Therisa Bi, MD;  Location: [facility] ENDOSCOPY;  Service: Gastroenterology;  Laterality: N/A;   GIVENS CAPSULE STUDY N/A 11/30/2017   Procedure: GIVENS CAPSULE STUDY;  Surgeon: Therisa Bi, MD;  Location: [facility] ENDOSCOPY;  Service: Gastroenterology;  Laterality: N/A;   HEMOSTASIS CLIP PLACEMENT  01/30/2023   Procedure: HEMOSTASIS CLIP PLACEMENT;  Surgeon: Therisa Bi, MD;  Location: [facility] ENDOSCOPY;  Service: Gastroenterology;;   KNEE ARTHROSCOPY  03/07/2010   Left foot surgery Left  03/03/2022   POLYPECTOMY  01/30/2023   Procedure: POLYPECTOMY;  Surgeon: Therisa Bi, MD;  Location: [facility] ENDOSCOPY;  Service: Gastroenterology;;    OB History   No obstetric history on file.      Home Medications    Prior to Admission medications   Medication Sig Start Date End Date Taking? Authorizing Provider  amoxicillin -clavulanate (AUGMENTIN ) 875-125 MG tablet Take 1 tablet by mouth every 12 (twelve) hours.  09/14/23  Yes Cilicia Borden R, NP  guaiFENesin -codeine  100-10 MG/5ML syrup Take 5 mLs by mouth every 6 (six) hours as needed for cough. 09/14/23  Yes Noach Calvillo R, NP  predniSONE  (STERAPRED UNI-PAK 21 TAB) 10 MG (21) TBPK tablet Take by mouth daily. Take 6 tabs by mouth daily  for 1 days, then 5 tabs for 1 days, then 4 tabs for 1 days, then 3 tabs for 1 days, 2 tabs for 1 days, then 1 tab by mouth daily for 1 days 09/14/23  Yes Harden Bramer, Shelba SAUNDERS, NP  Accu-Chek FastClix Lancets MISC USE TO TEST BLOOD SUGAR TWICE DAILY AS DIRECTED 07/28/21   Clark, Katherine K, NP  amitriptyline  (ELAVIL ) 50 MG tablet TAKE 1 TABLET BY MOUTH AT BEDTIME FOR BACK PAIN 01/24/23   Gretta Comer POUR, NP  aspirin  EC 81 MG tablet Take 81 mg by mouth daily. Swallow whole.    [provider]  b complex vitamins capsule Take 1 capsule by mouth 2 (two) times daily.    [provider]  Continuous Glucose Sensor (FREESTYLE LIBRE 3 SENSOR) MISC PLACE 1 SENSOR ON THE SKIN EVERY 14 DAYS. USE TO CHECK GLUCOSE CONTINUOUSLY 09/29/22   Clark, Katherine K, NP  ezetimibe  (ZETIA ) 10 MG tablet TAKE 1 TABLET BY MOUTH DAILY 06/12/23   Gollan, Timothy J, MD  famotidine  (PEPCID ) 20 MG tablet Take 1 tablet (20 mg total) by mouth 2 (two) times daily. For heartburn. 04/22/22   Clark, Katherine K, NP  gentamicin  ointment (GARAMYCIN ) 0.1 % Apply 1 Application topically daily as needed. For nasal sore. 06/09/23   Clark, Katherine K, NP  glipiZIDE  (GLUCOTROL  XL) 5 MG 24 hr tablet TAKE 1 TABLET DAILY WITH BREAKFAST FOR DIABETES 07/30/23   Clark, Katherine K, NP  glucose blood (CONTOUR NEXT TEST) test strip Use to check blood sugar up to three times daily 03/10/23   Clark, Katherine K, NP  hydrochlorothiazide  (HYDRODIURIL ) 25 MG tablet TAKE 1 TABLET DAILY 04/17/23   Gollan, Timothy J, MD  indomethacin  (INDOCIN ) 50 MG capsule Take 1 capsule (50 mg total) by mouth 3 (three) times daily with meals. As needed for joint swelling. 08/10/23   Clark,  Katherine K, NP  ipratropium (ATROVENT  HFA) 17 MCG/ACT inhaler Inhale 2 puffs into the lungs every 4 (four) hours as needed for wheezing. 01/16/22   Cici Rodriges R, NP  ipratropium (ATROVENT ) 0.03 % nasal spray Place 2 sprays into both nostrils every 12 (twelve) hours. 04/13/23   Teresa Shelba SAUNDERS, NP  Iron -Vitamin C 65-125 MG TABS Take by mouth daily.     [provider]  losartan  (COZAAR ) 50 MG tablet Take 1 tablet (50 mg total) by mouth daily. 10/31/22 03/30/23  Abigail Bernardino HERO, PA-C  Magnesium  500 MG CAPS Take 1 capsule by mouth 2 (two) times daily.    [provider]  methocarbamol  (ROBAXIN ) 750 MG tablet Take 375-750 mg by mouth at bedtime as needed. 08/31/21   [provider]  metoprolol  succinate (TOPROL -XL) 25 MG 24 hr tablet TAKE 1 TABLET TWICE A DAY  WITH OR IMMEDIATELY FOLLOWING A MEAL 05/08/23   Gollan, Timothy J, MD  metoprolol  tartrate (LOPRESSOR ) 25 MG tablet Take 0.5 tablet (12.5 mg) by mouth once daily as needed for palpitations 01/24/23   Abigail Bernardino HERO, PA-C  Multiple Vitamins-Minerals (MULTIVITAMIN ADULT PO) Take 1 capsule by mouth daily.     [provider]  propafenone  (RYTHMOL  SR) 225 MG 12 hr capsule TAKE 1 CAPSULE BY MOUTH 2 TIMES DAILY 08/18/23   Gollan, Timothy J, MD  rosuvastatin  (CRESTOR ) 5 MG tablet TAKE 1 TABLET BY MOUTH DAILY GENERIC EQUIVALENT FOR CRESTOR  05/22/23   Gollan, Timothy J, MD  sitaGLIPtin -metformin  (JANUMET ) 50-1000 MG tablet TAKE 1 TABLET TWICE A DAY WITH MEALS FOR DIABETES 06/18/23   Clark, Katherine K, NP  SYNTHROID  88 MCG tablet Take 1 tablet by mouth every morning on an empty stomach with water only.  No food or other medications for 30 minutes. 01/23/23   Gretta Comer POUR, NP  verapamil  (CALAN -SR) 120 MG CR tablet Take 1 tablet (120 mg total) by mouth daily. 10/31/22   Abigail Bernardino HERO, PA-C    Family History Family History  Problem Relation Age of Onset   Depression Mother    Sudden death Mother 8   Heart attack Mother     Arthritis Father    Heart disease Father    Hypertension Father    Emphysema Father    Bladder Cancer Sister    Diabetes Sister        Type 2   Diabetes Brother        type   Hypertension Brother    Emphysema Brother    Heart disease Brother    Diabetes Maternal Grandfather    Bladder Cancer Maternal Grandmother        sinus cancer    Breast cancer Paternal Grandmother    Cancer Paternal Grandfather        colon cancer   Diabetes Paternal Grandfather     Social History Social History   Tobacco Use   Smoking status: Former    Current packs/day: 0.00    Average packs/day: 1 pack/day for 20.0 years (20.0 ttl pk-yrs)    Types: Cigarettes    Start date: 03/07/1984    Quit date: 03/07/2004    Years since quitting: 19.5   Smokeless tobacco: Never  Vaping Use   Vaping status: Never Used  Substance Use Topics   Alcohol use: No    Alcohol/week: 0.0 standard drinks of alcohol   Drug use: No     Allergies   Albuterol  and Sulfa  antibiotics   Review of Systems Review of Systems   Physical Exam Triage Vital Signs ED Triage Vitals  Encounter Vitals Group     BP 09/14/23 1254 127/74     Girls Systolic BP Percentile --      Girls Diastolic BP Percentile --      Boys Systolic BP Percentile --      Boys Diastolic BP Percentile --      Pulse Rate 09/14/23 1254 66     Resp 09/14/23 1254 16     Temp 09/14/23 1254 98 F (36.7 C)     Temp Source 09/14/23 1254 Oral     SpO2 09/14/23 1254 95 %     Weight --      Height --      Head Circumference --      Peak Flow --      Pain Score 09/14/23 1255 2  Pain Loc --      Pain Education --      Exclude from Growth Chart --    No data found.  Updated Vital Signs BP 127/74 (BP Location: Left Arm)   Pulse 66   Temp 98 F (36.7 C) (Oral)   Resp 16   SpO2 95%   Visual Acuity Right Eye Distance:   Left Eye Distance:   Bilateral Distance:    Right Eye Near:   Left Eye Near:    Bilateral Near:     Physical  Exam Constitutional:      Appearance: Normal appearance.  HENT:     Right Ear: Tympanic membrane, ear canal and external ear normal.     Left Ear: Tympanic membrane, ear canal and external ear normal.     Nose: Congestion present.     Right Sinus: Maxillary sinus tenderness present.     Left Sinus: Maxillary sinus tenderness present.     Mouth/Throat:     Pharynx: No oropharyngeal exudate or posterior oropharyngeal erythema.  Eyes:     Extraocular Movements: Extraocular movements intact.  Cardiovascular:     Rate and Rhythm: Normal rate and regular rhythm.     Pulses: Normal pulses.     Heart sounds: Normal heart sounds.  Pulmonary:     Effort: Pulmonary effort is normal.     Breath sounds: Normal breath sounds.  Neurological:     Mental Status: She is alert and oriented to person, place, and time. Mental status is at baseline.      UC Treatments / Results  Labs (all labs ordered are listed, but only abnormal results are displayed) Labs Reviewed - No data to display  EKG   Radiology No results found.  Procedures Procedures (including critical care time)  Medications Ordered in UC Medications - No data to display  Initial Impression / Assessment and Plan / UC Course  I have reviewed the triage vital signs and the nursing notes.  Pertinent labs & imaging results that were available during my care of the patient were reviewed by me and considered in my medical decision making (see chart for details).  Acute nonrecurrent maxillary sinusitis  Patient is in no signs of distress nor toxic appearing.  Vital signs are stable.  Low suspicion for pneumonia, pneumothorax or bronchitis and therefore will defer imaging.  Prescribed Augmentin , prednisone  and guaifenesin  codeine , PDMP reviewed, low risk, days Tessalon ineffective. May use additional over-the-counter medications as needed for supportive care.  May follow-up with urgent care as needed if symptoms persist or  worsen.   Final Clinical Impressions(s) / UC Diagnoses   Final diagnoses:  Acute non-recurrent maxillary sinusitis     Discharge Instructions      Today you are being treated for sinus infection  Begin Augmentin  twice daily for 7 days to get rid of any bacteria contributing to symptoms  Begin prednisone  every morning with food for 6 days to reduce sinus pressure, may take Tylenol  additionally if needed  May use cough syrup every 6 hours please be mindful can make you drowsy    You can take Tylenol   as needed for fever reduction and pain relief.   For cough: honey 1/2 to 1 teaspoon (you can dilute the honey in water or another fluid).  You can also use guaifenesin  and dextromethorphan for cough. You can use a humidifier for chest congestion and cough.  If you don't have a humidifier, you can sit in the bathroom with the  hot shower running.      For sore throat: try warm salt water gargles, cepacol lozenges, throat spray, warm tea or water with lemon/honey, popsicles or ice, or OTC cold relief medicine for throat discomfort.   For congestion: take a daily anti-histamine like Zyrtec, Claritin, and a oral decongestant, such as pseudoephedrine.  You can also use Flonase  1-2 sprays in each nostril daily.   It is important to stay hydrated: drink plenty of fluids (water, gatorade/powerade/pedialyte, juices, or teas) to keep your throat moisturized and help further relieve irritation/discomfort.    ED Prescriptions     Medication Sig Dispense Auth. Provider   amoxicillin -clavulanate (AUGMENTIN ) 875-125 MG tablet Take 1 tablet by mouth every 12 (twelve) hours. 14 tablet Jishnu Jenniges R, NP   predniSONE  (STERAPRED UNI-PAK 21 TAB) 10 MG (21) TBPK tablet Take by mouth daily. Take 6 tabs by mouth daily  for 1 days, then 5 tabs for 1 days, then 4 tabs for 1 days, then 3 tabs for 1 days, 2 tabs for 1 days, then 1 tab by mouth daily for 1 days 21 tablet Kirby Argueta R, NP    guaiFENesin -codeine  100-10 MG/5ML syrup Take 5 mLs by mouth every 6 (six) hours as needed for cough. 120 mL Gabryel Talamo, Shelba SAUNDERS, NP      I have reviewed the PDMP during this encounter.   Teresa Shelba SAUNDERS, NP 09/14/23 1341

## 2023-09-14 NOTE — Discharge Instructions (Signed)
 Today you are being treated for sinus infection  Begin Augmentin  twice daily for 7 days to get rid of any bacteria contributing to symptoms  Begin prednisone  every morning with food for 6 days to reduce sinus pressure, may take Tylenol  additionally if needed  May use cough syrup every 6 hours please be mindful can make you drowsy    You can take Tylenol   as needed for fever reduction and pain relief.   For cough: honey 1/2 to 1 teaspoon (you can dilute the honey in water or another fluid).  You can also use guaifenesin  and dextromethorphan for cough. You can use a humidifier for chest congestion and cough.  If you don't have a humidifier, you can sit in the bathroom with the hot shower running.      For sore throat: try warm salt water gargles, cepacol lozenges, throat spray, warm tea or water with lemon/honey, popsicles or ice, or OTC cold relief medicine for throat discomfort.   For congestion: take a daily anti-histamine like Zyrtec, Claritin, and a oral decongestant, such as pseudoephedrine.  You can also use Flonase  1-2 sprays in each nostril daily.   It is important to stay hydrated: drink plenty of fluids (water, gatorade/powerade/pedialyte, juices, or teas) to keep your throat moisturized and help further relieve irritation/discomfort.

## 2023-10-04 ENCOUNTER — Other Ambulatory Visit: Payer: Self-pay

## 2023-10-04 DIAGNOSIS — E039 Hypothyroidism, unspecified: Secondary | ICD-10-CM

## 2023-10-04 MED ORDER — SYNTHROID 88 MCG PO TABS: ORAL_TABLET | ORAL | 0 refills | Status: DC

## 2023-10-05 ENCOUNTER — Other Ambulatory Visit: Payer: Self-pay | Admitting: *Deleted

## 2023-10-05 ENCOUNTER — Other Ambulatory Visit: Payer: Self-pay | Admitting: Physician Assistant

## 2023-10-05 ENCOUNTER — Other Ambulatory Visit: Payer: Self-pay | Admitting: Primary Care

## 2023-10-05 DIAGNOSIS — G8929 Other chronic pain: Secondary | ICD-10-CM

## 2023-10-05 MED ORDER — LOSARTAN POTASSIUM 50 MG PO TABS: 50.0000 mg | ORAL_TABLET | Freq: Every day | ORAL | 3 refills | Status: DC

## 2023-10-05 NOTE — Progress Notes (Signed)
 Rx Refill

## 2023-11-22 ENCOUNTER — Ambulatory Visit
Admission: RE | Admit: 2023-11-22 | Discharge: 2023-11-22 | Disposition: A | Discharge status: Home / Self Care | Source: Ambulatory Visit | Attending: Primary Care | Admitting: Primary Care

## 2023-11-22 ENCOUNTER — Ambulatory Visit: Payer: Self-pay | Admitting: Primary Care

## 2023-11-22 ENCOUNTER — Encounter: Payer: Self-pay | Admitting: Primary Care

## 2023-11-22 ENCOUNTER — Ambulatory Visit (INDEPENDENT_AMBULATORY_CARE_PROVIDER_SITE_OTHER): Admitting: Primary Care

## 2023-11-22 VITALS — BP 126/82 | HR 64 | Temp 97.2°F | Ht 66.0 in | Wt 165.0 lb

## 2023-11-22 DIAGNOSIS — E1159 Type 2 diabetes mellitus with other circulatory complications: Secondary | ICD-10-CM | POA: Diagnosis not present

## 2023-11-22 DIAGNOSIS — R1032 Left lower quadrant pain: Secondary | ICD-10-CM | POA: Diagnosis present

## 2023-11-22 DIAGNOSIS — R197 Diarrhea, unspecified: Secondary | ICD-10-CM | POA: Diagnosis not present

## 2023-11-22 DIAGNOSIS — Z794 Long term (current) use of insulin: Secondary | ICD-10-CM

## 2023-11-22 LAB — CBC WITH DIFFERENTIAL/PLATELET
Basophils Absolute: 0.1 K/uL (ref 0.0–0.1)
Basophils Relative: 0.7 % (ref 0.0–3.0)
Eosinophils Absolute: 0.1 K/uL (ref 0.0–0.7)
Eosinophils Relative: 1.5 % (ref 0.0–5.0)
HCT: 40.4 % (ref 36.0–46.0)
Hemoglobin: 13.7 g/dL (ref 12.0–15.0)
Lymphocytes Relative: 16.5 % (ref 12.0–46.0)
Lymphs Abs: 1.6 K/uL (ref 0.7–4.0)
MCHC: 34 g/dL (ref 30.0–36.0)
MCV: 91.2 fl (ref 78.0–100.0)
Monocytes Absolute: 0.7 K/uL (ref 0.1–1.0)
Monocytes Relative: 7.3 % (ref 3.0–12.0)
Neutro Abs: 7.1 K/uL (ref 1.4–7.7)
Neutrophils Relative %: 74 % (ref 43.0–77.0)
Platelets: 346 K/uL (ref 150.0–400.0)
RBC: 4.43 Mil/uL (ref 3.87–5.11)
RDW: 14.3 % (ref 11.5–15.5)
WBC: 9.6 K/uL (ref 4.0–10.5)

## 2023-11-22 LAB — BASIC METABOLIC PANEL WITH GFR
BUN: 17 mg/dL (ref 6–23)
CO2: 29 meq/L (ref 19–32)
Calcium: 9.6 mg/dL (ref 8.4–10.5)
Chloride: 97 meq/L (ref 96–112)
Creatinine, Ser: 0.87 mg/dL (ref 0.40–1.20)
GFR: 69.75 mL/min (ref >60.00)
Glucose, Bld: 168 mg/dL — ABNORMAL HIGH (ref 70–99)
Potassium: 4.3 meq/L (ref 3.5–5.1)
Sodium: 135 meq/L (ref 135–145)

## 2023-11-22 LAB — LIPASE: Lipase: 46 U/L (ref 11.0–59.0)

## 2023-11-22 MED ORDER — IOHEXOL 9 MG/ML PO SOLN
500.0000 mL | ORAL | Status: AC
  Administered 2023-11-22: 500 mL via ORAL

## 2023-11-22 MED ORDER — IOHEXOL 300 MG/ML  SOLN
100.0000 mL | Freq: Once | INTRAMUSCULAR | Status: AC | PRN
  Administered 2023-11-22: 100 mL via INTRAVENOUS

## 2023-11-22 MED ORDER — GLUCOSE BLOOD VI STRP: ORAL_STRIP | 0 refills | Status: DC

## 2023-11-22 NOTE — Patient Instructions (Signed)
 Stop by the lab prior to leaving today. I will notify you of your results once received.   You will receive a phone call regarding the CT scan.  It was a pleasure to see you today!

## 2023-11-22 NOTE — Assessment & Plan Note (Addendum)
 Differentials include acute diverticulitis, cholecystitis, colitis.  Given location of pain, coupled with symptoms and prior history of diverticulitis, will obtain CT abdomen/pelvis and labs for further evaluation. She agrees.   Stat CT abdomen/pelvis ordered. Checking labs for CBC with diff, BMP, lipase.   Encouraged hydration   She appears stable for outpatient treatment. Await results.

## 2023-11-22 NOTE — Progress Notes (Signed)
 Subjective:    Patient ID: Debra Perez, female    DOB: 09/29/1955, 68 y.o.   MRN: [id number]  Debra Perez is a very pleasant 68 y.o. female with history of hypertension, paroxysmal atrial fibrillation, CAD, GERD, type 2 diabetes, hypothyroidism, sigmoid diverticula, hyperlipidemia who presents today to discuss diarrhea.  Symptom onset two weeks ago with large, loose stools occurring twice daily within 10-20 minutes after eating a meal. She has a generalized achy sensation and tenderness to the left mid and lower abdomen.   She denies fevers, chills, nausea, vomiting, decreased appetite, new medications or supplements, blood in the stool. She is hydrating well with liquids, no symptoms with liquids.   She underwent colonoscopy on 01/30/2023 which revealed several polyps, hemorrhoids, and multiple medium sigmoid diverticula. Her last episode of diverticulitis was in June 2025, not confirmed with CT.    Review of Systems  Constitutional:  Negative for chills and fever.  Gastrointestinal:  Positive for abdominal pain and diarrhea. Negative for blood in stool, nausea and vomiting.         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  On Eliquis ; b. CHADS2VASc => 3 (HTN, DM, female)   Skin ulcer of left ankle, limited to breakdown of skin (HCC) 04/02/2020   Sleep apnea     Social History   Socioeconomic History   Marital status: Married    Spouse name: Not on file   Number of children: Not on file   Years of education: Not  on file   Highest education level: Associate degree: occupational, Scientist, product/process development, or vocational program  Occupational History   Occupation: retired  Tobacco Use   Smoking status: Former    Current packs/day: 0.00    Average packs/day: 1 pack/day for 20.0 years (20.0 ttl pk-yrs)    Types: Cigarettes    Start date: 03/07/1984    Quit date: 03/07/2004    Years since quitting: 19.7   Smokeless tobacco: Never  Vaping Use   Vaping status: Never Used  Substance and Sexual Activity   Alcohol use: No    Alcohol/week: 0.0 standard drinks of alcohol   Drug use: No   Sexual activity: Not on file  Other Topics Concern   Not on file  Social History Narrative   Married.   1 daughter.   Moved here from [place]  due to husbands occupation.   She is a Public house manager.   Enjoys sewing, gardening.   Social Drivers of Corporate investment banker Strain: Low Risk  (11/18/2023)   Overall Financial Resource Strain (CARDIA)    Difficulty of Paying Living Expenses: Not hard at all  Food Insecurity: No Food Insecurity (11/18/2023)   Hunger Vital Sign    Worried About Running Out of Food in the Last Year: Never true    Ran Out of Food in the Last Year: Never true  Transportation Needs: No Transportation Needs (11/18/2023)   PRAPARE - Transportation  Lack of Transportation (Medical): No    Lack of Transportation (Non-Medical): No  Physical Activity: Insufficiently Active (11/18/2023)   Exercise Vital Sign    Days of Exercise per Week: 3 days    Minutes of Exercise per Session: 20 min  Stress: No Stress Concern Present (11/18/2023)   [employer] of Occupational Health - Occupational Stress Questionnaire    Feeling of Stress: Not at all  Social Connections: Socially Integrated (11/18/2023)   Social Connection and Isolation Panel    Frequency of Communication with Friends and Family: More than three times a week    Frequency of Social Gatherings with Friends and Family: Twice a week    Attends Religious Services:  More than 4 times per year    Active Member of Clubs or Organizations: Yes    Attends Engineer, structural: More than 4 times per year    Marital Status: Married  Catering manager Violence: Not on file    Past Surgical History:  Procedure Laterality Date   ABDOMINAL HYSTERECTOMY  03/07/2000   ovaries remain   CLAVICLE SURGERY  03/07/2005   right clavicle plate pins   COLONOSCOPY WITH PROPOFOL  N/A 10/17/2017   Procedure: COLONOSCOPY WITH PROPOFOL ;  Surgeon: Therisa Bi, MD;  Location: [facility] ENDOSCOPY;  Service: Gastroenterology;  Laterality: N/A;   COLONOSCOPY WITH PROPOFOL  N/A 01/30/2023   Procedure: COLONOSCOPY WITH PROPOFOL ;  Surgeon: Therisa Bi, MD;  Location: [facility] ENDOSCOPY;  Service: Gastroenterology;  Laterality: N/A;   ESOPHAGOGASTRODUODENOSCOPY (EGD) WITH PROPOFOL  N/A 10/17/2017   Procedure: ESOPHAGOGASTRODUODENOSCOPY (EGD) WITH PROPOFOL ;  Surgeon: Therisa Bi, MD;  Location: [facility] ENDOSCOPY;  Service: Gastroenterology;  Laterality: N/A;   GIVENS CAPSULE STUDY N/A 11/30/2017   Procedure: GIVENS CAPSULE STUDY;  Surgeon: Therisa Bi, MD;  Location: [facility] ENDOSCOPY;  Service: Gastroenterology;  Laterality: N/A;   HEMOSTASIS CLIP PLACEMENT  01/30/2023   Procedure: HEMOSTASIS CLIP PLACEMENT;  Surgeon: Therisa Bi, MD;  Location: [facility] ENDOSCOPY;  Service: Gastroenterology;;   KNEE ARTHROSCOPY  03/07/2010   Left foot surgery Left 03/03/2022   POLYPECTOMY  01/30/2023   Procedure: POLYPECTOMY;  Surgeon: Therisa Bi, MD;  Location: [facility] ENDOSCOPY;  Service: Gastroenterology;;    Family History  Problem Relation Age of Onset   Depression Mother    Sudden death Mother 90   Heart attack Mother    Arthritis Father    Heart disease Father    Hypertension Father    Emphysema Father    Bladder Cancer Sister    Diabetes Sister        Type 2   Diabetes Brother        type   Hypertension Brother    Emphysema Brother    Heart disease Brother    Diabetes Maternal Grandfather     Bladder Cancer Maternal Grandmother        sinus cancer    Breast cancer Paternal Grandmother    Cancer Paternal Grandfather        colon cancer   Diabetes Paternal Grandfather     Allergies  Allergen Reactions   Albuterol     Sulfa  Antibiotics Nausea And Vomiting    Current Outpatient Medications on File Prior to Visit  Medication Sig Dispense Refill   Accu-Chek FastClix Lancets MISC USE TO TEST BLOOD SUGAR TWICE DAILY AS DIRECTED 204 each 1   amitriptyline  (ELAVIL ) 50 MG tablet TAKE 1 TABLET BY MOUTH AT BEDTIME FOR BACK PAIN 90 tablet 0   aspirin  EC 81 MG tablet Take 81 mg by mouth daily.  Swallow whole.     b complex vitamins capsule Take 1 capsule by mouth 2 (two) times daily.     ezetimibe  (ZETIA ) 10 MG tablet TAKE 1 TABLET BY MOUTH DAILY 90 tablet 3   famotidine  (PEPCID ) 20 MG tablet Take 1 tablet (20 mg total) by mouth 2 (two) times daily. For heartburn. 180 tablet 1   gentamicin  ointment (GARAMYCIN ) 0.1 % Apply 1 Application topically daily as needed. For nasal sore. 15 g 0   glipiZIDE  (GLUCOTROL  XL) 5 MG 24 hr tablet TAKE 1 TABLET DAILY WITH BREAKFAST FOR DIABETES 90 tablet 1   hydrochlorothiazide  (HYDRODIURIL ) 25 MG tablet TAKE 1 TABLET DAILY 90 tablet 3   ipratropium (ATROVENT  HFA) 17 MCG/ACT inhaler Inhale 2 puffs into the lungs every 4 (four) hours as needed for wheezing. 1 each 0   ipratropium (ATROVENT ) 0.03 % nasal spray Place 2 sprays into both nostrils every 12 (twelve) hours. 30 mL 0   Iron -Vitamin C 65-125 MG TABS Take by mouth daily.      losartan  (COZAAR ) 50 MG tablet TAKE 1 TABLET DAILY 90 tablet 1   Magnesium  500 MG CAPS Take 1 capsule by mouth 2 (two) times daily.     methocarbamol  (ROBAXIN ) 750 MG tablet Take 375-750 mg by mouth at bedtime as needed.     metoprolol  succinate (TOPROL -XL) 25 MG 24 hr tablet TAKE 1 TABLET TWICE A DAY WITH OR IMMEDIATELY FOLLOWING A MEAL 180 tablet 3   metoprolol  tartrate (LOPRESSOR ) 25 MG tablet Take 0.5 tablet (12.5 mg) by  mouth once daily as needed for palpitations     Multiple Vitamins-Minerals (MULTIVITAMIN ADULT PO) Take 1 capsule by mouth daily.      propafenone  (RYTHMOL  SR) 225 MG 12 hr capsule TAKE 1 CAPSULE BY MOUTH 2 TIMES DAILY 180 capsule 2   rosuvastatin  (CRESTOR ) 5 MG tablet TAKE 1 TABLET BY MOUTH DAILY GENERIC EQUIVALENT FOR CRESTOR  90 tablet 3   sitaGLIPtin -metformin  (JANUMET ) 50-1000 MG tablet TAKE 1 TABLET TWICE A DAY WITH MEALS FOR DIABETES 180 tablet 1   SYNTHROID  88 MCG tablet Take 1 tablet by mouth every morning on an empty stomach with water only.  No food or other medications for 30 minutes. 90 tablet 0   verapamil  (CALAN -SR) 120 MG CR tablet TAKE 1 TABLET DAILY 90 tablet 1   Continuous Glucose Sensor (FREESTYLE LIBRE 3 SENSOR) MISC PLACE 1 SENSOR ON THE SKIN EVERY 14 DAYS. USE TO CHECK GLUCOSE CONTINUOUSLY (Patient not taking: Reported on 11/22/2023) 6 each 3   indomethacin  (INDOCIN ) 50 MG capsule Take 1 capsule (50 mg total) by mouth 3 (three) times daily with meals. As needed for joint swelling. (Patient not taking: Reported on 11/22/2023) 30 capsule 0   losartan  (COZAAR ) 50 MG tablet Take 1 tablet (50 mg total) by mouth daily. (Patient not taking: Reported on 11/22/2023) 90 tablet 3   Current Facility-Administered Medications on File Prior to Visit  Medication Dose Route Frequency Provider Last Rate Last Admin   heparin  lock flush 100 unit/mL  500 Units Intracatheter Once PRN Babara Call, MD       sodium chloride  flush (NS) 0.9 % injection 10 mL  10 mL Intracatheter PRN Babara Call, MD   10 mL at 10/06/17 1515    BP 126/82   Pulse 64   Temp (!) 97.2 F (36.2 C) (Temporal)   Ht 5' 6 (1.676 m)   Wt 165 lb (74.8 kg)   SpO2 100%   BMI 26.63 kg/m  Objective:  Physical Exam Cardiovascular:     Rate and Rhythm: Normal rate.  Pulmonary:     Effort: Pulmonary effort is normal.  Abdominal:     General: Bowel sounds are normal.     Palpations: Abdomen is soft.     Tenderness: There is  abdominal tenderness in the right upper quadrant. There is no guarding.  Musculoskeletal:     Cervical back: Neck supple.  Skin:    General: Skin is warm and dry.  Neurological:     Mental Status: She is alert and oriented to person, place, and time.  Psychiatric:        Mood and Affect: Mood normal.     Physical Exam        Assessment & Plan:  Left lower quadrant abdominal pain Assessment & Plan: Differentials include acute diverticulitis, cholecystitis, colitis.  Given location of pain, coupled with symptoms and prior history of diverticulitis, will obtain CT abdomen/pelvis and labs for further evaluation. She agrees.   Stat CT abdomen/pelvis ordered. Checking labs for CBC with diff, BMP, lipase.   She appears stable for outpatient treatment. Await results.   Orders: -     CBC with Differential/Platelet -     Basic metabolic panel with GFR -     CT ABDOMEN PELVIS W CONTRAST; Future -     Lipase  Acute diarrhea Assessment & Plan: Differentials include acute diverticulitis, cholecystitis, colitis.  Given location of pain, coupled with symptoms and prior history of diverticulitis, will obtain CT abdomen/pelvis and labs for further evaluation. She agrees.   Stat CT abdomen/pelvis ordered. Checking labs for CBC with diff, BMP, lipase.   Encouraged hydration   She appears stable for outpatient treatment. Await results.      Type 2 diabetes mellitus with other circulatory complication, without long-term current use of insulin  (HCC) -     Glucose Blood; Use to test blood sugar once daily  Dispense: 300 each; Refill: 0    Assessment and Plan Assessment & Plan         Comer MARLA Gaskins, NP   History of Present Illness

## 2023-11-22 NOTE — Assessment & Plan Note (Signed)
 Differentials include acute diverticulitis, cholecystitis, colitis.  Given location of pain, coupled with symptoms and prior history of diverticulitis, will obtain CT abdomen/pelvis and labs for further evaluation. She agrees.   Stat CT abdomen/pelvis ordered. Checking labs for CBC with diff, BMP, lipase.   She appears stable for outpatient treatment. Await results.

## 2023-11-24 ENCOUNTER — Other Ambulatory Visit: Payer: Self-pay | Admitting: Primary Care

## 2023-11-24 ENCOUNTER — Other Ambulatory Visit: Payer: Self-pay

## 2023-11-24 DIAGNOSIS — R197 Diarrhea, unspecified: Secondary | ICD-10-CM

## 2023-11-27 ENCOUNTER — Ambulatory Visit: Payer: Self-pay | Admitting: Primary Care

## 2023-11-27 DIAGNOSIS — E1159 Type 2 diabetes mellitus with other circulatory complications: Secondary | ICD-10-CM

## 2023-11-27 LAB — C. DIFFICILE GDH AND TOXIN A/B
GDH ANTIGEN: NOT DETECTED
MICRO NUMBER:: 16992080
SPECIMEN QUALITY:: ADEQUATE
TOXIN A AND B: NOT DETECTED

## 2023-11-27 NOTE — Telephone Encounter (Signed)
 Patient came to pick up form 11/27/23

## 2023-11-28 ENCOUNTER — Other Ambulatory Visit: Payer: Self-pay | Admitting: Radiology

## 2023-11-28 ENCOUNTER — Encounter: Payer: Self-pay | Admitting: Primary Care

## 2023-11-28 DIAGNOSIS — R197 Diarrhea, unspecified: Secondary | ICD-10-CM

## 2023-11-28 LAB — GIARDIA ANTIGEN
MICRO NUMBER:: 16992065
RESULT:: NOT DETECTED
SPECIMEN QUALITY:: ADEQUATE

## 2023-11-28 LAB — GASTROINTESTINAL PATHOGEN PNL

## 2023-11-28 MED ORDER — MOUNJARO 2.5 MG/0.5ML ~~LOC~~ SOAJ: 2.5000 mg | SUBCUTANEOUS | 0 refills | Status: DC

## 2023-11-29 ENCOUNTER — Ambulatory Visit: Payer: Self-pay | Admitting: Primary Care

## 2023-11-29 LAB — GIARDIA ANTIGEN
MICRO NUMBER:: 17005055
RESULT:: NOT DETECTED
SPECIMEN QUALITY:: ADEQUATE

## 2023-12-07 ENCOUNTER — Encounter: Admitting: Primary Care

## 2023-12-20 ENCOUNTER — Ambulatory Visit
Admission: RE | Admit: 2023-12-20 | Discharge: 2023-12-20 | Disposition: A | Discharge status: Home / Self Care | Source: Ambulatory Visit | Attending: Emergency Medicine | Admitting: Emergency Medicine

## 2023-12-20 VITALS — BP 121/71 | HR 69 | Temp 98.2°F | Resp 19

## 2023-12-20 DIAGNOSIS — E1159 Type 2 diabetes mellitus with other circulatory complications: Secondary | ICD-10-CM

## 2023-12-20 DIAGNOSIS — J0100 Acute maxillary sinusitis, unspecified: Secondary | ICD-10-CM

## 2023-12-20 MED ORDER — MOUNJARO 5 MG/0.5ML ~~LOC~~ SOAJ: 5.0000 mg | SUBCUTANEOUS | 0 refills | Status: DC

## 2023-12-20 MED ORDER — AMOXICILLIN-POT CLAVULANATE 875-125 MG PO TABS: 1.0000 | ORAL_TABLET | Freq: Two times a day (BID) | ORAL | 0 refills | Status: AC

## 2023-12-20 MED ORDER — IPRATROPIUM BROMIDE 0.03 % NA SOLN: 2.0000 | Freq: Two times a day (BID) | NASAL | 0 refills | Status: AC

## 2023-12-20 NOTE — ED Notes (Signed)
 Patient triage by A. White, NP

## 2023-12-20 NOTE — ED Triage Notes (Signed)
 Patient complains of nasal congestion x 7 days. Tried nasal decongestant with no improvement.

## 2023-12-20 NOTE — Discharge Instructions (Addendum)
 Begin Augmentin  twice daily for 7 days for treatment of bacteria contributing to your symptoms  You may use nasal spray twice daily to further help clear out the sinuses    You can take Tylenol  and/or Ibuprofen  as needed for fever reduction and pain relief.   For cough: honey 1/2 to 1 teaspoon (you can dilute the honey in water or another fluid).  You can also use guaifenesin  and dextromethorphan for cough. You can use a humidifier for chest congestion and cough.  If you don't have a humidifier, you can sit in the bathroom with the hot shower running.      For sore throat: try warm salt water gargles, cepacol lozenges, throat spray, warm tea or water with lemon/honey, popsicles or ice, or OTC cold relief medicine for throat discomfort.   For congestion: take a daily anti-histamine like Zyrtec, Claritin, and a oral decongestant, such as pseudoephedrine.  You can also use Flonase  1-2 sprays in each nostril daily.   It is important to stay hydrated: drink plenty of fluids (water, gatorade/powerade/pedialyte, juices, or teas) to keep your throat moisturized and help further relieve irritation/discomfort.

## 2023-12-20 NOTE — ED Provider Notes (Signed)
 CAY RALPH PELT    CSN: [id number] Arrival date & time: 12/20/23  0841      History   Chief Complaint Chief Complaint  Patient presents with   Nasal Congestion    Has been going on x5 days - Entered by patient    HPI Debra Perez is a 68 y.o. female.   Patient presents for evaluation of fever peaking at 100, nasal congestion, sinus pressure to the temples and the cheeks, sore throat, left-sided ear fullness, intermittent headaches, nausea without vomiting present for 7 days.  Symptoms began during travel but no known sick contacts.  Tolerable to food and liquids but appetite is decreased.  Has attempted use of decongestants and a Nettie pot.  Denies presence of a cough, shortness of breath or wheezing.  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  On Eliquis ; b. CHADS2VASc => 3 (HTN, DM, female)   Skin ulcer of left ankle, limited to breakdown of skin (HCC) 04/02/2020   Sleep apnea     Patient Active Problem List   Diagnosis Date Noted   Acute diarrhea 11/22/2023   Joint swelling 08/10/2023   Personal history of colon polyps, unspecified 01/30/2023   Adenomatous polyp of colon 01/30/2023   Sinus pressure 01/05/2022   Chronic neck pain 04/13/2020   Varicose veins of both lower extremities 05/13/2019   Left lower quadrant abdominal pain 08/06/2018   Iron  deficiency anemia 10/04/2017   Mitral valve disorder 08/30/2017   Osteopenia 04/19/2017   Arthralgia 11/09/2016   Pleuritic chest pain    Chronic back pain     Coronary artery disease involving native coronary artery of native heart with angina pectoris    Atypical chest pain 12/03/2015   SOB (shortness of breath) 08/07/2015   GERD (gastroesophageal reflux disease) 11/06/2014   NSVT (nonsustained ventricular tachycardia) (HCC) 11/06/2014   Preventative health care 11/05/2014   Paroxysmal atrial fibrillation (HCC) 09/17/2014   Hot flashes 09/17/2014   Type 2 diabetes mellitus (HCC) 08/21/2014   Essential hypertension 08/21/2014   Mixed hyperlipidemia 08/21/2014   Hypothyroidism 08/21/2014    Past Surgical History:  Procedure Laterality Date   ABDOMINAL HYSTERECTOMY  03/07/2000   ovaries remain   CLAVICLE SURGERY  03/07/2005   right clavicle plate pins   COLONOSCOPY WITH PROPOFOL  N/A 10/17/2017   Procedure: COLONOSCOPY WITH PROPOFOL ;  Surgeon: Therisa Bi, MD;  Location: [facility] ENDOSCOPY;  Service: Gastroenterology;  Laterality: N/A;   COLONOSCOPY WITH PROPOFOL  N/A 01/30/2023   Procedure: COLONOSCOPY WITH PROPOFOL ;  Surgeon: Therisa Bi, MD;  Location: [facility] ENDOSCOPY;  Service: Gastroenterology;  Laterality: N/A;   ESOPHAGOGASTRODUODENOSCOPY (EGD) WITH PROPOFOL  N/A 10/17/2017   Procedure: ESOPHAGOGASTRODUODENOSCOPY (EGD) WITH PROPOFOL ;  Surgeon: Therisa Bi, MD;  Location: [facility] ENDOSCOPY;  Service: Gastroenterology;  Laterality: N/A;   GIVENS CAPSULE STUDY N/A 11/30/2017   Procedure: GIVENS CAPSULE STUDY;  Surgeon: Therisa Bi, MD;  Location: [facility] ENDOSCOPY;  Service: Gastroenterology;  Laterality: N/A;  HEMOSTASIS CLIP PLACEMENT  01/30/2023   Procedure: HEMOSTASIS CLIP PLACEMENT;  Surgeon: Therisa Bi, MD;  Location: [facility] ENDOSCOPY;  Service: Gastroenterology;;   KNEE ARTHROSCOPY  03/07/2010   Left foot surgery Left 03/03/2022   POLYPECTOMY  01/30/2023   Procedure: POLYPECTOMY;  Surgeon: Therisa Bi, MD;  Location: [facility] ENDOSCOPY;  Service: Gastroenterology;;    OB History   No obstetric history on file.      Home Medications     Prior to Admission medications   Medication Sig Start Date End Date Taking? Authorizing Provider  amoxicillin -clavulanate (AUGMENTIN ) 875-125 MG tablet Take 1 tablet by mouth every 12 (twelve) hours. 12/20/23  Yes Wilson Sample R, NP  ipratropium (ATROVENT ) 0.03 % nasal spray Place 2 sprays into both nostrils every 12 (twelve) hours. 12/20/23  Yes Takiesha Mcdevitt, Shelba SAUNDERS, NP  Accu-Chek FastClix Lancets MISC USE TO TEST BLOOD SUGAR TWICE DAILY AS DIRECTED 07/28/21   Clark, Katherine K, NP  amitriptyline  (ELAVIL ) 50 MG tablet TAKE 1 TABLET BY MOUTH AT BEDTIME FOR BACK PAIN 10/05/23   Gretta Comer POUR, NP  aspirin  EC 81 MG tablet Take 81 mg by mouth daily. Swallow whole.    [provider]  b complex vitamins capsule Take 1 capsule by mouth 2 (two) times daily.    [provider]  Continuous Glucose Sensor (FREESTYLE LIBRE 3 SENSOR) MISC PLACE 1 SENSOR ON THE SKIN EVERY 14 DAYS. USE TO CHECK GLUCOSE CONTINUOUSLY Patient not taking: Reported on 11/22/2023 09/29/22   Gretta Comer POUR, NP  ezetimibe  (ZETIA ) 10 MG tablet TAKE 1 TABLET BY MOUTH DAILY 06/12/23   Gollan, Timothy J, MD  famotidine  (PEPCID ) 20 MG tablet Take 1 tablet (20 mg total) by mouth 2 (two) times daily. For heartburn. 04/22/22   Clark, Katherine K, NP  gentamicin  ointment (GARAMYCIN ) 0.1 % Apply 1 Application topically daily as needed. For nasal sore. 06/09/23   Clark, Katherine K, NP  glipiZIDE  (GLUCOTROL  XL) 5 MG 24 hr tablet TAKE 1 TABLET DAILY WITH BREAKFAST FOR DIABETES 07/30/23   Gretta Comer POUR, NP  glucose blood test strip Use to test blood sugar once daily 11/22/23   Clark, Katherine K, NP  hydrochlorothiazide  (HYDRODIURIL ) 25 MG tablet TAKE 1 TABLET DAILY 04/17/23   Gollan, Timothy J, MD  indomethacin  (INDOCIN ) 50 MG capsule Take 1 capsule (50 mg total) by mouth 3 (three) times daily with meals. As needed for joint swelling. Patient not taking: Reported on 11/22/2023 08/10/23   Clark, Katherine K, NP  ipratropium  (ATROVENT  HFA) 17 MCG/ACT inhaler Inhale 2 puffs into the lungs every 4 (four) hours as needed for wheezing. 01/16/22   Sydna Brodowski, Shelba SAUNDERS, NP  Iron -Vitamin C 65-125 MG TABS Take by mouth daily.     [provider]  losartan  (COZAAR ) 50 MG tablet TAKE 1 TABLET DAILY 10/06/23   Abigail Bernardino HERO, PA-C  losartan  (COZAAR ) 50 MG tablet Take 1 tablet (50 mg total) by mouth daily. Patient not taking: Reported on 11/22/2023 10/05/23 01/03/24  Abigail Bernardino HERO, PA-C  Magnesium  500 MG CAPS Take 1 capsule by mouth 2 (two) times daily.    [provider]  methocarbamol  (ROBAXIN ) 750 MG tablet Take 375-750 mg by mouth at bedtime as needed. 08/31/21   [provider]  metoprolol  succinate (TOPROL -XL) 25 MG 24 hr tablet TAKE 1 TABLET TWICE A DAY WITH OR IMMEDIATELY FOLLOWING A MEAL 05/08/23   Gollan, Timothy J, MD  metoprolol  tartrate (LOPRESSOR ) 25 MG tablet Take 0.5 tablet (12.5 mg)  by mouth once daily as needed for palpitations 01/24/23   Abigail Bernardino HERO, PA-C  Multiple Vitamins-Minerals (MULTIVITAMIN ADULT PO) Take 1 capsule by mouth daily.     [provider]  propafenone  (RYTHMOL  SR) 225 MG 12 hr capsule TAKE 1 CAPSULE BY MOUTH 2 TIMES DAILY 08/18/23   Gollan, Timothy J, MD  rosuvastatin  (CRESTOR ) 5 MG tablet TAKE 1 TABLET BY MOUTH DAILY GENERIC EQUIVALENT FOR CRESTOR  05/22/23   Gollan, Timothy J, MD  SYNTHROID  88 MCG tablet Take 1 tablet by mouth every morning on an empty stomach with water only.  No food or other medications for 30 minutes. 10/04/23   Clark, Katherine K, NP  tirzepatide  (MOUNJARO ) 2.5 MG/0.5ML Pen Inject 2.5 mg into the skin once a week. for diabetes. 11/28/23   Gretta Comer POUR, NP  verapamil  (CALAN -SR) 120 MG CR tablet TAKE 1 TABLET DAILY 10/06/23   Abigail Bernardino HERO, PA-C    Family History Family History  Problem Relation Age of Onset   Depression Mother    Sudden death Mother 60   Heart attack Mother    Arthritis Father    Heart disease Father    Hypertension Father     Emphysema Father    Bladder Cancer Sister    Diabetes Sister        Type 2   Diabetes Brother        type   Hypertension Brother    Emphysema Brother    Heart disease Brother    Diabetes Maternal Grandfather    Bladder Cancer Maternal Grandmother        sinus cancer    Breast cancer Paternal Grandmother    Cancer Paternal Grandfather        colon cancer   Diabetes Paternal Grandfather     Social History Social History   Tobacco Use   Smoking status: Former    Current packs/day: 0.00    Average packs/day: 1 pack/day for 20.0 years (20.0 ttl pk-yrs)    Types: Cigarettes    Start date: 03/07/1984    Quit date: 03/07/2004    Years since quitting: 19.8   Smokeless tobacco: Never  Vaping Use   Vaping status: Never Used  Substance Use Topics   Alcohol use: No    Alcohol/week: 0.0 standard drinks of alcohol   Drug use: No     Allergies   Albuterol  and Sulfa  antibiotics   Review of Systems Review of Systems   Physical Exam Triage Vital Signs ED Triage Vitals  Encounter Vitals Group     BP      Girls Systolic BP Percentile      Girls Diastolic BP Percentile      Boys Systolic BP Percentile      Boys Diastolic BP Percentile      Pulse      Resp      Temp      Temp src      SpO2      Weight      Height      Head Circumference      Peak Flow      Pain Score      Pain Loc      Pain Education      Exclude from Growth Chart    No data found.  Updated Vital Signs There were no vitals taken for this visit.  Visual Acuity Right Eye Distance:   Left Eye Distance:   Bilateral Distance:    Right  Eye Near:   Left Eye Near:    Bilateral Near:     Physical Exam Constitutional:      Appearance: Normal appearance.  HENT:     Head: Normocephalic.     Right Ear: Ear canal and external ear normal. A middle ear effusion is present.     Left Ear: Ear canal and external ear normal. A middle ear effusion is present.     Nose: Congestion present.     Right Sinus:  Maxillary sinus tenderness present.     Left Sinus: Maxillary sinus tenderness present.     Mouth/Throat:     Pharynx: No oropharyngeal exudate or posterior oropharyngeal erythema.  Eyes:     Extraocular Movements: Extraocular movements intact.  Cardiovascular:     Rate and Rhythm: Normal rate and regular rhythm.     Pulses: Normal pulses.     Heart sounds: Normal heart sounds.  Pulmonary:     Effort: Pulmonary effort is normal.     Breath sounds: Normal breath sounds.  Lymphadenopathy:     Cervical: Cervical adenopathy present.  Neurological:     Mental Status: She is alert and oriented to person, place, and time.      UC Treatments / Results  Labs (all labs ordered are listed, but only abnormal results are displayed) Labs Reviewed - No data to display  EKG   Radiology No results found.  Procedures Procedures (including critical care time)  Medications Ordered in UC Medications - No data to display  Initial Impression / Assessment and Plan / UC Course  I have reviewed the triage vital signs and the nursing notes.  Pertinent labs & imaging results that were available during my care of the patient were reviewed by me and considered in my medical decision making (see chart for details).  Acute nonrecurrent maxillary sinusitis  Patient is in no signs of distress nor toxic appearing.  Vital signs are stable.  Low suspicion for pneumonia, pneumothorax or bronchitis and therefore will defer imaging.  Viral testing deferred due to timeline of illness.  Symptoms and presentation consistent with a sinusitis, history of such, prescribed Augmentin  and Atrovent  nasal spray.May use additional over-the-counter medications as needed for supportive care.  May follow-up with urgent care as needed if symptoms persist or worsen.   Final Clinical Impressions(s) / UC Diagnoses   Final diagnoses:  Acute non-recurrent maxillary sinusitis     Discharge Instructions      Begin  Augmentin  twice daily for 7 days for treatment of bacteria contributing to your symptoms  You may use nasal spray twice daily to further help clear out the sinuses    You can take Tylenol  and/or Ibuprofen  as needed for fever reduction and pain relief.   For cough: honey 1/2 to 1 teaspoon (you can dilute the honey in water or another fluid).  You can also use guaifenesin  and dextromethorphan for cough. You can use a humidifier for chest congestion and cough.  If you don't have a humidifier, you can sit in the bathroom with the hot shower running.      For sore throat: try warm salt water gargles, cepacol lozenges, throat spray, warm tea or water with lemon/honey, popsicles or ice, or OTC cold relief medicine for throat discomfort.   For congestion: take a daily anti-histamine like Zyrtec, Claritin, and a oral decongestant, such as pseudoephedrine.  You can also use Flonase  1-2 sprays in each nostril daily.   It is important to stay hydrated: drink  plenty of fluids (water, gatorade/powerade/pedialyte, juices, or teas) to keep your throat moisturized and help further relieve irritation/discomfort.    ED Prescriptions     Medication Sig Dispense Auth. Provider   amoxicillin -clavulanate (AUGMENTIN ) 875-125 MG tablet Take 1 tablet by mouth every 12 (twelve) hours. 14 tablet Shawn Carattini R, NP   ipratropium (ATROVENT ) 0.03 % nasal spray Place 2 sprays into both nostrils every 12 (twelve) hours. 30 mL Teresa Shelba SAUNDERS, NP      PDMP not reviewed this encounter.   Teresa Shelba SAUNDERS, [place] 12/20/23 [phone]

## 2023-12-25 ENCOUNTER — Other Ambulatory Visit: Payer: Self-pay

## 2023-12-25 DIAGNOSIS — E039 Hypothyroidism, unspecified: Secondary | ICD-10-CM

## 2023-12-25 MED ORDER — SYNTHROID 88 MCG PO TABS: ORAL_TABLET | ORAL | 0 refills | Status: DC

## 2023-12-27 ENCOUNTER — Encounter: Payer: Self-pay | Admitting: Primary Care

## 2023-12-27 ENCOUNTER — Ambulatory Visit: Admitting: Primary Care

## 2023-12-27 VITALS — BP 128/70 | HR 63 | Temp 97.8°F | Ht 66.0 in | Wt 163.0 lb

## 2023-12-27 DIAGNOSIS — M2540 Effusion, unspecified joint: Secondary | ICD-10-CM | POA: Diagnosis not present

## 2023-12-27 DIAGNOSIS — E782 Mixed hyperlipidemia: Secondary | ICD-10-CM | POA: Diagnosis not present

## 2023-12-27 DIAGNOSIS — I480 Paroxysmal atrial fibrillation: Secondary | ICD-10-CM

## 2023-12-27 DIAGNOSIS — E039 Hypothyroidism, unspecified: Secondary | ICD-10-CM | POA: Diagnosis not present

## 2023-12-27 DIAGNOSIS — Z23 Encounter for immunization: Secondary | ICD-10-CM

## 2023-12-27 DIAGNOSIS — Z0000 Encounter for general adult medical examination without abnormal findings: Secondary | ICD-10-CM

## 2023-12-27 DIAGNOSIS — E1159 Type 2 diabetes mellitus with other circulatory complications: Secondary | ICD-10-CM | POA: Diagnosis not present

## 2023-12-27 DIAGNOSIS — I10 Essential (primary) hypertension: Secondary | ICD-10-CM

## 2023-12-27 DIAGNOSIS — M5441 Lumbago with sciatica, right side: Secondary | ICD-10-CM

## 2023-12-27 DIAGNOSIS — I4729 Other ventricular tachycardia: Secondary | ICD-10-CM

## 2023-12-27 DIAGNOSIS — K219 Gastro-esophageal reflux disease without esophagitis: Secondary | ICD-10-CM

## 2023-12-27 DIAGNOSIS — I25119 Atherosclerotic heart disease of native coronary artery with unspecified angina pectoris: Secondary | ICD-10-CM

## 2023-12-27 DIAGNOSIS — G8929 Other chronic pain: Secondary | ICD-10-CM

## 2023-12-27 LAB — HEMOGLOBIN A1C: Hgb A1c MFr Bld: 7.4 % — ABNORMAL HIGH (ref 4.6–6.5)

## 2023-12-27 LAB — LIPID PANEL
Cholesterol: 131 mg/dL (ref 0–200)
HDL: 43.4 mg/dL (ref >39.00)
LDL Cholesterol: 47 mg/dL (ref 0–99)
NonHDL: 87.46
Total CHOL/HDL Ratio: 3
Triglycerides: 200 mg/dL — ABNORMAL HIGH (ref 0.0–149.0)
VLDL: 40 mg/dL (ref 0.0–40.0)

## 2023-12-27 LAB — TSH: TSH: 1.08 u[IU]/mL (ref 0.35–5.50)

## 2023-12-27 MED ORDER — INDOMETHACIN 50 MG PO CAPS: 50.0000 mg | ORAL_CAPSULE | Freq: Three times a day (TID) | ORAL | 0 refills | Status: AC

## 2023-12-27 NOTE — Assessment & Plan Note (Signed)
 Controlled.  Continue hydrochlorothiazide  25 mg daily, losartan  50 mg daily, metoprolol  succinate 25 mg daily, verapamil  SR 120 mg daily. BMP reviewed from September 2025.

## 2023-12-27 NOTE — Assessment & Plan Note (Signed)
 Asymptomatic.  Following with cardiology. Continue diabetes control, blood pressure control, lipid control.

## 2023-12-27 NOTE — Assessment & Plan Note (Signed)
 Prevnar 20 provided today Mammogram and Leviste scan up-to-date Colonoscopy UTD, due 2027  Discussed the importance of a healthy diet and regular exercise in order for weight loss, and to reduce the risk of further co-morbidity.  Exam stable. Labs pending.  Follow up in 1 year for repeat physical.

## 2023-12-27 NOTE — Assessment & Plan Note (Signed)
Repeat lipid panel pending.  Continue Zetia 10 mg daily, rosuvastatin 5 mg daily.

## 2023-12-27 NOTE — Assessment & Plan Note (Signed)
 No recent episodes  Continue verapamil  ER 120 mg daily, metoprolol  succinate 25 mg daily, propafenone  225 mg twice daily.

## 2023-12-27 NOTE — Progress Notes (Signed)
 Subjective:    Debra Perez is a 68 y.o. female who presents for a Welcome to Medicare exam.   Cardiac Risk Factors include: none  BP Readings from Last 3 Encounters:  12/27/23 128/70  12/20/23 121/71  11/22/23 126/82        Objective:    Today's Vitals   12/27/23 0914 12/27/23 0916  BP: 128/70   Pulse: 63   Temp: 97.8 F (36.6 C)   TempSrc: Temporal   SpO2: 100%   Weight: 163 lb (73.9 kg)   Height: 5' 6 (1.676 m)   PainSc:  0-No pain  Body mass index is 26.31 kg/m.  Medications Outpatient Encounter Medications as of 12/27/2023  Medication Sig   Accu-Chek FastClix Lancets MISC USE TO TEST BLOOD SUGAR TWICE DAILY AS DIRECTED   amitriptyline  (ELAVIL ) 50 MG tablet TAKE 1 TABLET BY MOUTH AT BEDTIME FOR BACK PAIN   amoxicillin -clavulanate (AUGMENTIN ) 875-125 MG tablet Take 1 tablet by mouth every 12 (twelve) hours.   aspirin  EC 81 MG tablet Take 81 mg by mouth daily. Swallow whole.   b complex vitamins capsule Take 1 capsule by mouth 2 (two) times daily.   ezetimibe  (ZETIA ) 10 MG tablet TAKE 1 TABLET BY MOUTH DAILY   famotidine  (PEPCID ) 20 MG tablet Take 1 tablet (20 mg total) by mouth 2 (two) times daily. For heartburn.   gentamicin  ointment (GARAMYCIN ) 0.1 % Apply 1 Application topically daily as needed. For nasal sore.   glipiZIDE  (GLUCOTROL  XL) 5 MG 24 hr tablet TAKE 1 TABLET DAILY WITH BREAKFAST FOR DIABETES   glucose blood test strip Use to test blood sugar once daily   hydrochlorothiazide  (HYDRODIURIL ) 25 MG tablet TAKE 1 TABLET DAILY   ipratropium (ATROVENT  HFA) 17 MCG/ACT inhaler Inhale 2 puffs into the lungs every 4 (four) hours as needed for wheezing.   ipratropium (ATROVENT ) 0.03 % nasal spray Place 2 sprays into both nostrils every 12 (twelve) hours.   Iron -Vitamin C 65-125 MG TABS Take by mouth daily.    losartan  (COZAAR ) 50 MG tablet TAKE 1 TABLET DAILY   Magnesium  500 MG CAPS Take 1 capsule by mouth 2 (two) times daily.   methocarbamol  (ROBAXIN ) 750  MG tablet Take 375-750 mg by mouth at bedtime as needed.   metoprolol  succinate (TOPROL -XL) 25 MG 24 hr tablet TAKE 1 TABLET TWICE A DAY WITH OR IMMEDIATELY FOLLOWING A MEAL   metoprolol  tartrate (LOPRESSOR ) 25 MG tablet Take 0.5 tablet (12.5 mg) by mouth once daily as needed for palpitations   Multiple Vitamins-Minerals (MULTIVITAMIN ADULT PO) Take 1 capsule by mouth daily.    propafenone  (RYTHMOL  SR) 225 MG 12 hr capsule TAKE 1 CAPSULE BY MOUTH 2 TIMES DAILY   rosuvastatin  (CRESTOR ) 5 MG tablet TAKE 1 TABLET BY MOUTH DAILY GENERIC EQUIVALENT FOR CRESTOR    SYNTHROID  88 MCG tablet Take 1 tablet by mouth every morning on an empty stomach with water only.  No food or other medications for 30 minutes.   tirzepatide  (MOUNJARO ) 5 MG/0.5ML Pen Inject 5 mg into the skin once a week. for diabetes.   verapamil  (CALAN -SR) 120 MG CR tablet TAKE 1 TABLET DAILY   Continuous Glucose Sensor (FREESTYLE LIBRE 3 SENSOR) MISC PLACE 1 SENSOR ON THE SKIN EVERY 14 DAYS. USE TO CHECK GLUCOSE CONTINUOUSLY (Patient not taking: Reported on 12/27/2023)   indomethacin  (INDOCIN ) 50 MG capsule Take 1 capsule (50 mg total) by mouth 3 (three) times daily with meals. As needed for joint swelling.   losartan  (COZAAR ) 50  MG tablet Take 1 tablet (50 mg total) by mouth daily. (Patient not taking: Reported on 12/27/2023)   [DISCONTINUED] indomethacin  (INDOCIN ) 50 MG capsule Take 1 capsule (50 mg total) by mouth 3 (three) times daily with meals. As needed for joint swelling. (Patient not taking: Reported on 12/27/2023)   [DISCONTINUED] SYNTHROID  88 MCG tablet Take 1 tablet by mouth every morning on an empty stomach with water only.  No food or other medications for 30 minutes.   [DISCONTINUED] tirzepatide  (MOUNJARO ) 2.5 MG/0.5ML Pen Inject 2.5 mg into the skin once a week. for diabetes.   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  On Eliquis ; b. CHADS2VASc => 3 (HTN, DM, female)   Skin ulcer of left ankle, limited to breakdown of skin (HCC) 04/02/2020   Sleep apnea    Past Surgical History:  Procedure Laterality Date   ABDOMINAL HYSTERECTOMY  03/07/2000   ovaries remain   CLAVICLE SURGERY  03/07/2005   right clavicle plate pins   COLONOSCOPY WITH PROPOFOL  N/A 10/17/2017   Procedure: COLONOSCOPY WITH PROPOFOL ;  Surgeon: Therisa Bi, MD;  Location: [facility] ENDOSCOPY;  Service: Gastroenterology;  Laterality: N/A;   COLONOSCOPY WITH PROPOFOL  N/A 01/30/2023   Procedure: COLONOSCOPY WITH PROPOFOL ;  Surgeon: Therisa Bi, MD;  Location: [facility] ENDOSCOPY;  Service: Gastroenterology;  Laterality: N/A;   ESOPHAGOGASTRODUODENOSCOPY (EGD) WITH PROPOFOL  N/A 10/17/2017   Procedure: ESOPHAGOGASTRODUODENOSCOPY (EGD) WITH PROPOFOL ;  Surgeon: Therisa Bi, MD;  Location: [facility] ENDOSCOPY;  Service: Gastroenterology;  Laterality: N/A;   GIVENS CAPSULE STUDY N/A 11/30/2017   Procedure: GIVENS CAPSULE STUDY;  Surgeon: Therisa Bi, MD;  Location: [facility] ENDOSCOPY;  Service: Gastroenterology;  Laterality: N/A;   HEMOSTASIS CLIP PLACEMENT  01/30/2023   Procedure: HEMOSTASIS CLIP PLACEMENT;  Surgeon: Therisa Bi, MD;  Location: [facility] ENDOSCOPY;  Service: Gastroenterology;;   KNEE ARTHROSCOPY  03/07/2010   Left foot surgery Left 03/03/2022   POLYPECTOMY  01/30/2023   Procedure: POLYPECTOMY;  Surgeon: Therisa Bi, MD;   Location: [facility] ENDOSCOPY;  Service: Gastroenterology;;    Family History  Problem Relation Age of Onset   Depression Mother    Sudden death Mother 34   Heart attack Mother    Arthritis Father    Heart disease Father    Hypertension Father    Emphysema Father    Bladder Cancer Sister    Diabetes Sister        Type 2   Diabetes Brother        type   Hypertension Brother    Emphysema Brother    Heart disease Brother    Diabetes Maternal Grandfather    Bladder Cancer Maternal Grandmother        sinus cancer    Breast cancer Paternal Grandmother    Cancer Paternal  Grandfather        colon cancer   Diabetes Paternal Grandfather    Social History   Occupational History   Occupation: retired  Tobacco Use   Smoking status: Former    Current packs/day: 0.00    Average packs/day: 1 pack/day for 20.0 years (20.0 ttl pk-yrs)    Types: Cigarettes    Start date: 03/07/1984    Quit date: 03/07/2004    Years since quitting: 19.8   Smokeless tobacco: Never  Vaping Use   Vaping status: Never Used  Substance and Sexual Activity   Alcohol use: No    Alcohol/week: 0.0 standard drinks of alcohol   Drug use: No   Sexual activity: Not on file    Tobacco Counseling Counseling given: Not Answered   Immunizations and Health Maintenance Immunization History  Administered Date(s) Administered   INFLUENZA, HIGH DOSE SEASONAL PF 12/06/2023   Influenza Inj Mdck Quad Pf 11/30/2017   Influenza, Mdck, Trivalent,PF 6+ MOS(egg free) 12/13/2022   Influenza,inj,Quad PF,6+ Mos 12/05/2015, 12/13/2016, 12/04/2018, 11/24/2020, 11/30/2021   Influenza-Unspecified 12/22/2014, 12/06/2019   MODERNA COVID-19 SARS-COV-2 PEDS BIVALENT BOOSTER 45yr-57yr 11/24/2020   Moderna Covid-19 Fall Seasonal Vaccine 40yrs & older 11/23/2021, 11/29/2022   Moderna Covid-19 Vaccine Bivalent Booster 49yrs & up 12/01/2020   Moderna Sars-Covid-2 Vaccination 05/16/2019, 06/13/2019, 01/06/2020, 06/13/2020   PNEUMOCOCCAL  CONJUGATE-20 12/27/2023   Pneumococcal Polysaccharide-23 12/05/2012, 04/10/2019   Tdap 11/05/2014   Zoster Recombinant(Shingrix) 02/28/2019, 04/30/2019   Health Maintenance Due  Topic Date Due   OPHTHALMOLOGY EXAM  09/05/2022   COVID-19 Vaccine (9 - 2025-26 season) 11/06/2023   HEMOGLOBIN A1C  12/09/2023    Activities of Daily Living    12/27/2023    9:17 AM 12/23/2023    8:54 AM  In your present state of health, do you have any difficulty performing the following activities:  Hearing? 0 0  Vision? 0 0  Difficulty concentrating or making decisions? 1 0  Walking or climbing stairs? 0 0  Dressing or bathing? 0 0  Doing errands, shopping? 0 0  Preparing Food and eating ? N N  Using the Toilet? N N  In the past six months, have you accidently leaked urine? N N  Do you have problems with loss of bowel control? N N  Managing your Medications? N N  Managing your Finances? N N  Housekeeping or managing your Housekeeping? N N    Physical Exam   Physical Exam (optional), or other factors deemed appropriate based on the beneficiary's medical and social history and current clinical standards.   Advanced Directives: Does Patient Have a Medical Advance Directive?: Yes Type of Advance Directive: Healthcare Power of Attorney, Living will Does patient want to make changes to medical advance directive?: No - Patient declined  EKG: Completes annually per cardiology       Assessment:     Vision/Hearing screen Hearing Screening   500Hz  1000Hz  2000Hz  4000Hz   Right ear 20 20 20 20   Left ear 20 25 20 20    Vision Screening   Right eye Left eye Both eyes  Without correction 20/50 20/25 20/20   With correction        Goals      Weight (lb) < 140 lb (63.5 kg)        Depression Screen    12/27/2023    9:23 AM 06/09/2023    9:22 AM 05/10/2022    8:11 AM 11/30/2021    8:42 AM  PHQ 2/9 Scores  PHQ - 2  Score 0 0 0 0  PHQ- 9 Score    2     Fall Risk    12/27/2023    9:26 AM   Fall Risk   Falls in the past year? 0  Number falls in past yr: 0  Injury with Fall? 0  Risk for fall due to : No Fall Risks  Follow up Falls evaluation completed    Cognitive Function:        12/27/2023    9:26 AM  6CIT Screen  What Year? 0 points  What month? 0 points  What time? 0 points  Count back from 20 0 points  Months in reverse 4 points  Repeat phrase 0 points  Total Score 4 points    Patient Care Team: Gretta Comer POUR, NP as PCP - General (Internal Medicine) Perla Evalene PARAS, MD as PCP - Cardiology (Cardiology) Perla Evalene PARAS, MD as Consulting Physician (Cardiology) Blair Mt, MD as Referring Physician (Otolaryngology)     Plan:     I have personally reviewed and noted the following in the patient's chart:   Medical and social history Use of alcohol, tobacco or illicit drugs  Current medications and supplements including opioid prescriptions. Patient is not currently taking opioid prescriptions. Functional ability and status Nutritional status Physical activity Advanced directives completed  List of other physicians Hospitalizations, surgeries, and ER visits in previous 12 months Vitals Screenings to include cognitive, depression, and falls Referrals and appointments   In addition, I have reviewed and discussed with patient certain preventive protocols, quality metrics, and best practice recommendations. A written personalized care plan for preventive services as well as general preventive health recommendations were provided to patient.     Chelli Yerkes K Taimur Fier, NP 12/27/2023

## 2023-12-27 NOTE — Assessment & Plan Note (Addendum)
 Improved  Refill provided for indomethacin  50 mg to use as needed

## 2023-12-27 NOTE — Assessment & Plan Note (Signed)
 Repeat TSH pending.  Continue levothyroxine  88 mcg daily.

## 2023-12-27 NOTE — Assessment & Plan Note (Signed)
 No concerns today.  Continue famotidine  20 mg twice daily

## 2023-12-27 NOTE — Assessment & Plan Note (Signed)
 No concerns today.  Continue methocarbamol  750 mg as needed.

## 2023-12-27 NOTE — Assessment & Plan Note (Signed)
 Repeat A1c pending.  Start Mounjaro  5 mg weekly as planned. Continue glipizide  XL 5 mg daily for now.  Consider discontinuation in the future.  Foot exam today.  Follow-up in 4 to 6 months based on A1c result.

## 2023-12-27 NOTE — Assessment & Plan Note (Signed)
 Rate and rhythm regular today Following with cardiology.   Continue verapamil  SR 120 mg daily, metoprolol  succinate 25 mg daily.

## 2023-12-28 ENCOUNTER — Ambulatory Visit: Payer: Self-pay | Admitting: Primary Care

## 2024-01-03 ENCOUNTER — Other Ambulatory Visit: Payer: Self-pay | Admitting: Primary Care

## 2024-01-03 DIAGNOSIS — G8929 Other chronic pain: Secondary | ICD-10-CM

## 2024-01-16 ENCOUNTER — Encounter: Payer: Self-pay | Admitting: Pulmonary Disease

## 2024-01-16 ENCOUNTER — Ambulatory Visit (INDEPENDENT_AMBULATORY_CARE_PROVIDER_SITE_OTHER): Admitting: Pulmonary Disease

## 2024-01-16 VITALS — BP 112/64 | HR 72 | Ht 66.0 in | Wt 159.8 lb

## 2024-01-16 DIAGNOSIS — G4733 Obstructive sleep apnea (adult) (pediatric): Secondary | ICD-10-CM

## 2024-01-16 NOTE — Patient Instructions (Signed)
 I will see you in about a year  Download from the machine shows that is working well  We do not need to make any changes  Call us  with significant concerns  Continue using your machine nightly continue getting an adequate number of hours of sleep

## 2024-01-16 NOTE — Progress Notes (Signed)
 Debra Perez    [id number]    26-Oct-1955  Primary Care Physician:Clark, Comer POUR, NP  Referring Physician: Gretta Comer POUR, NP [address] [place],  [place] [address]  Chief complaint:   Known obstructive sleep apnea Compliant with CPAP Tolerating CPAP well  HPI:  Not having any significant problems No significant complaints  Waking up feeling like she is good nights rest  Sleep CPAP almost 9 hours  Occasionally may feel the need to take a nap maybe once every week or once every other week, even with a nap she will put her CPAP on  Not having any significant concerns  Sleep apnea was diagnosed when she was out in [place]  She is unsure how severe the sleep apnea is for a current machine settings  Usually goes to bed about 1030 takes about 5 to 10 minutes to fall asleep 1-2 awakenings final wake up time between 630 and 7 Weight has been relatively stable  No significant concerning daytime symptoms  History of hypertension, atrial fibrillation once in the past, hypercholesterolemia  reformed smoker, quit in 2007  No history of lung disease No family history of obstructive sleep apnea Spouse has sleep apnea, on treatment  Outpatient Encounter Medications as of 01/16/2024  Medication Sig   Accu-Chek FastClix Lancets MISC USE TO TEST BLOOD SUGAR TWICE DAILY AS DIRECTED   amitriptyline  (ELAVIL ) 50 MG tablet TAKE 1 TABLET BY MOUTH AT BEDTIME FOR BACK PAIN   amoxicillin -clavulanate (AUGMENTIN ) 875-125 MG tablet Take 1 tablet by mouth every 12 (twelve) hours.   aspirin  EC 81 MG tablet Take 81 mg by mouth daily. Swallow whole.   b complex vitamins capsule Take 1 capsule by mouth 2 (two) times daily.   Continuous Glucose Sensor (FREESTYLE LIBRE 3 SENSOR) MISC PLACE 1 SENSOR ON THE SKIN EVERY 14 DAYS. USE TO CHECK GLUCOSE CONTINUOUSLY   ezetimibe  (ZETIA ) 10 MG tablet TAKE 1 TABLET BY MOUTH DAILY   famotidine  (PEPCID ) 20 MG tablet Take 1 tablet (20 mg  total) by mouth 2 (two) times daily. For heartburn.   gentamicin  ointment (GARAMYCIN ) 0.1 % Apply 1 Application topically daily as needed. For nasal sore.   glipiZIDE  (GLUCOTROL  XL) 5 MG 24 hr tablet TAKE 1 TABLET DAILY WITH BREAKFAST FOR DIABETES   glucose blood test strip Use to test blood sugar once daily   hydrochlorothiazide  (HYDRODIURIL ) 25 MG tablet TAKE 1 TABLET DAILY   indomethacin  (INDOCIN ) 50 MG capsule Take 1 capsule (50 mg total) by mouth 3 (three) times daily with meals. As needed for joint swelling.   ipratropium (ATROVENT  HFA) 17 MCG/ACT inhaler Inhale 2 puffs into the lungs every 4 (four) hours as needed for wheezing.   ipratropium (ATROVENT ) 0.03 % nasal spray Place 2 sprays into both nostrils every 12 (twelve) hours.   Iron -Vitamin C 65-125 MG TABS Take by mouth daily.    losartan  (COZAAR ) 50 MG tablet TAKE 1 TABLET DAILY   losartan  (COZAAR ) 50 MG tablet Take 1 tablet (50 mg total) by mouth daily.   Magnesium  500 MG CAPS Take 1 capsule by mouth 2 (two) times daily.   methocarbamol  (ROBAXIN ) 750 MG tablet Take 375-750 mg by mouth at bedtime as needed.   metoprolol  succinate (TOPROL -XL) 25 MG 24 hr tablet TAKE 1 TABLET TWICE A DAY WITH OR IMMEDIATELY FOLLOWING A MEAL   metoprolol  tartrate (LOPRESSOR ) 25 MG tablet Take 0.5 tablet (12.5 mg) by mouth once daily as needed for palpitations  Multiple Vitamins-Minerals (MULTIVITAMIN ADULT PO) Take 1 capsule by mouth daily.    propafenone  (RYTHMOL  SR) 225 MG 12 hr capsule TAKE 1 CAPSULE BY MOUTH 2 TIMES DAILY   rosuvastatin  (CRESTOR ) 5 MG tablet TAKE 1 TABLET BY MOUTH DAILY GENERIC EQUIVALENT FOR CRESTOR    SYNTHROID  88 MCG tablet Take 1 tablet by mouth every morning on an empty stomach with water only.  No food or other medications for 30 minutes.   tirzepatide  (MOUNJARO ) 5 MG/0.5ML Pen Inject 5 mg into the skin once a week. for diabetes.   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  On Eliquis ; b. CHADS2VASc => 3 (HTN, DM, female)   Skin ulcer of left ankle, limited to breakdown of skin (HCC) 04/02/2020   Sleep apnea     Past Surgical History:  Procedure Laterality Date   ABDOMINAL HYSTERECTOMY  03/07/2000   ovaries remain   CLAVICLE SURGERY  03/07/2005   right clavicle plate pins   COLONOSCOPY WITH PROPOFOL  N/A 10/17/2017   Procedure: COLONOSCOPY WITH PROPOFOL ;  Surgeon: Therisa Bi, MD;  Location: [facility] ENDOSCOPY;  Service: Gastroenterology;  Laterality: N/A;   COLONOSCOPY WITH PROPOFOL  N/A 01/30/2023   Procedure: COLONOSCOPY WITH PROPOFOL ;  Surgeon: Therisa Bi, MD;  Location: [facility] ENDOSCOPY;  Service: Gastroenterology;  Laterality: N/A;   ESOPHAGOGASTRODUODENOSCOPY (EGD) WITH PROPOFOL  N/A 10/17/2017   Procedure: ESOPHAGOGASTRODUODENOSCOPY (EGD) WITH PROPOFOL ;  Surgeon: Therisa Bi, MD;  Location: [facility] ENDOSCOPY;  Service: Gastroenterology;  Laterality: N/A;   GIVENS CAPSULE STUDY N/A 11/30/2017   Procedure: GIVENS CAPSULE STUDY;  Surgeon: Therisa Bi, MD;  Location: [facility] ENDOSCOPY;  Service: Gastroenterology;  Laterality: N/A;   HEMOSTASIS CLIP PLACEMENT  01/30/2023    Procedure: HEMOSTASIS CLIP PLACEMENT;  Surgeon: Therisa Bi, MD;  Location: [facility] ENDOSCOPY;  Service: Gastroenterology;;   KNEE ARTHROSCOPY  03/07/2010   Left foot surgery Left 03/03/2022   POLYPECTOMY  01/30/2023   Procedure: POLYPECTOMY;  Surgeon: Therisa Bi, MD;  Location: [facility] ENDOSCOPY;  Service: Gastroenterology;;    Family History  Problem Relation Age of Onset   Depression Mother    Sudden death Mother 65   Heart attack Mother    Arthritis Father    Heart disease Father    Hypertension Father    Emphysema Father    Bladder Cancer Sister    Diabetes Sister        Type 2   Diabetes Brother        type   Hypertension Brother    Emphysema Brother    Heart disease Brother  Diabetes Maternal Grandfather    Bladder Cancer Maternal Grandmother        sinus cancer    Breast cancer Paternal Grandmother    Cancer Paternal Grandfather        colon cancer   Diabetes Paternal Grandfather     Social History   Socioeconomic History   Marital status: Married    Spouse name: Not on file   Number of children: Not on file   Years of education: Not on file   Highest education level: Associate degree: occupational, scientist, product/process development, or vocational program  Occupational History   Occupation: retired  Tobacco Use   Smoking status: Former    Current packs/day: 0.00    Average packs/day: 1 pack/day for 20.0 years (20.0 ttl pk-yrs)    Types: Cigarettes    Start date: 03/07/1984    Quit date: 03/07/2004    Years since quitting: 19.8   Smokeless tobacco: Never  Vaping Use   Vaping status: Never Used  Substance and Sexual Activity   Alcohol use: No    Alcohol/week: 0.0 standard drinks of alcohol   Drug use: No   Sexual activity: Not on file  Other Topics Concern   Not on file  Social History Narrative   Married.   1 daughter.   Moved here from [place]  due to husbands occupation.   She is a PUBLIC HOUSE MANAGER.   Enjoys sewing, gardening.   Social Drivers of Corporate Investment Banker Strain:  Low Risk  (12/27/2023)   Overall Financial Resource Strain (CARDIA)    Difficulty of Paying Living Expenses: Not hard at all  Food Insecurity: No Food Insecurity (12/27/2023)   Hunger Vital Sign    Worried About Running Out of Food in the Last Year: Never true    Ran Out of Food in the Last Year: Never true  Transportation Needs: No Transportation Needs (12/27/2023)   PRAPARE - Administrator, Civil Service (Medical): No    Lack of Transportation (Non-Medical): No  Physical Activity: Insufficiently Active (12/27/2023)   Exercise Vital Sign    Days of Exercise per Week: 2 days    Minutes of Exercise per Session: 20 min  Stress: No Stress Concern Present (12/27/2023)   [employer] of Occupational Health - Occupational Stress Questionnaire    Feeling of Stress: Not at all  Social Connections: Socially Integrated (12/27/2023)   Social Connection and Isolation Panel    Frequency of Communication with Friends and Family: More than three times a week    Frequency of Social Gatherings with Friends and Family: Twice a week    Attends Religious Services: More than 4 times per year    Active Member of [employer] or Organizations: Yes    Attends Engineer, Structural: More than 4 times per year    Marital Status: Married  Catering Manager Violence: Not At Risk (12/27/2023)   Humiliation, Afraid, Rape, and Kick questionnaire    Fear of Current or Ex-Partner: No    Emotionally Abused: No    Physically Abused: No    Sexually Abused: No    Review of Systems  Constitutional:  Negative for unexpected weight change.  Respiratory:  Positive for apnea. Negative for shortness of breath.   Psychiatric/Behavioral:  Positive for sleep disturbance.     Vitals:   01/16/24 1002  BP: 112/64  Pulse: 72     Physical Exam Constitutional:      Appearance: Normal appearance.  HENT:     Head:  Normocephalic.     Mouth/Throat:     Mouth: Mucous membranes are moist.  Eyes:      General:        Right eye: No discharge.        Left eye: No discharge.     Pupils: Pupils are equal, round, and reactive to light.  Cardiovascular:     Rate and Rhythm: Normal rate.     Heart sounds: No murmur heard.    No friction rub.  Pulmonary:     Effort: No respiratory distress.     Breath sounds: No stridor. No wheezing or rhonchi.  Musculoskeletal:     Cervical back: No rigidity or tenderness.  Neurological:     Mental Status: She is alert.  Psychiatric:        Mood and Affect: Mood normal.     CPAP compliance reviewed showing excellent compliance average use of 8 hours 58 minutes AutoSet 7-15 95 percentile pressure of 8 Residual AHI of 0.1 Assessment:  History of obstructive sleep apnea - Remains compliant with CPAP - Download shows CPAP is effective - She is feeling well - Continues to benefit from CPAP    Plan/Recommendations: Continue CPAP use nightly  Call with significant concerns  Follow-up a year from now   Jennet Epley MD [facility] 01/16/2024, 10:15 AM  CC: Gretta Comer POUR, NP

## 2024-01-18 DIAGNOSIS — E1159 Type 2 diabetes mellitus with other circulatory complications: Secondary | ICD-10-CM

## 2024-01-19 MED ORDER — MOUNJARO 5 MG/0.5ML ~~LOC~~ SOAJ: 5.0000 mg | SUBCUTANEOUS | 0 refills | Status: DC

## 2024-01-31 ENCOUNTER — Other Ambulatory Visit: Payer: Self-pay | Admitting: Primary Care

## 2024-01-31 DIAGNOSIS — E1159 Type 2 diabetes mellitus with other circulatory complications: Secondary | ICD-10-CM

## 2024-03-04 ENCOUNTER — Other Ambulatory Visit: Payer: Self-pay | Admitting: Primary Care

## 2024-03-04 DIAGNOSIS — Z1231 Encounter for screening mammogram for malignant neoplasm of breast: Secondary | ICD-10-CM

## 2024-03-12 ENCOUNTER — Ambulatory Visit

## 2024-03-12 ENCOUNTER — Other Ambulatory Visit: Payer: Medicare Other

## 2024-03-13 DIAGNOSIS — E1159 Type 2 diabetes mellitus with other circulatory complications: Secondary | ICD-10-CM

## 2024-03-14 NOTE — Addendum Note (Signed)
 Addended by: Rondel Episcopo K on: 03/14/2024 09:59 AM   Modules accepted: Orders

## 2024-03-19 ENCOUNTER — Other Ambulatory Visit

## 2024-03-19 ENCOUNTER — Ambulatory Visit: Payer: Self-pay | Admitting: Primary Care

## 2024-03-19 DIAGNOSIS — E1159 Type 2 diabetes mellitus with other circulatory complications: Secondary | ICD-10-CM

## 2024-03-19 LAB — POCT GLYCOSYLATED HEMOGLOBIN (HGB A1C): Hemoglobin A1C: 6.1 % — AB (ref 4.0–5.6)

## 2024-03-21 DIAGNOSIS — E1159 Type 2 diabetes mellitus with other circulatory complications: Secondary | ICD-10-CM

## 2024-03-21 MED ORDER — GLUCOSE BLOOD VI STRP: ORAL_STRIP | 3 refills | Status: AC

## 2024-03-25 ENCOUNTER — Other Ambulatory Visit: Payer: Self-pay | Admitting: Cardiovascular Disease

## 2024-03-26 ENCOUNTER — Ambulatory Visit: Payer: Self-pay | Admitting: Physician Assistant

## 2024-03-26 ENCOUNTER — Ambulatory Visit: Attending: Physician Assistant

## 2024-03-26 ENCOUNTER — Other Ambulatory Visit: Payer: Self-pay

## 2024-03-26 DIAGNOSIS — E039 Hypothyroidism, unspecified: Secondary | ICD-10-CM

## 2024-03-26 DIAGNOSIS — I341 Nonrheumatic mitral (valve) prolapse: Secondary | ICD-10-CM | POA: Insufficient documentation

## 2024-03-26 LAB — ECHOCARDIOGRAM COMPLETE
AR max vel: 2.15 cm2
AV Area VTI: 2.55 cm2
AV Area mean vel: 1.97 cm2
AV Mean grad: 5 mmHg
AV Peak grad: 8.8 mmHg
Ao pk vel: 1.48 m/s
Area-P 1/2: 2.32 cm2
S' Lateral: 2.65 cm

## 2024-03-26 MED ORDER — SYNTHROID 88 MCG PO TABS: ORAL_TABLET | ORAL | 1 refills | Status: AC

## 2024-03-26 NOTE — Telephone Encounter (Signed)
 Patient is due for diabetes follow up in late April, this will be required prior to any further refills.  Please schedule, thank you!

## 2024-04-01 ENCOUNTER — Encounter

## 2024-04-02 ENCOUNTER — Other Ambulatory Visit: Payer: Self-pay | Admitting: Physician Assistant

## 2024-04-02 ENCOUNTER — Ambulatory Visit: Admitting: Physician Assistant

## 2024-04-03 NOTE — Progress Notes (Unsigned)
 "  Cardiology Office Note    Date:  04/04/2024   ID:  Debra Perez, DOB 1955/10/17, MRN [id number]  PCP:  Gretta Comer POUR, NP  Cardiologist:  Evalene Lunger, MD  Electrophysiologist:  None   Chief Complaint: Follow-up  History of Present Illness:   Debra Perez is a 69 y.o. female with history of CAD medically managed as below, PAF, pSVT, NSVT, mild mitral regurgitation with moderate mitral valve prolapse, ectatic ascending aorta measuring 3.8 x 3.6 cm in 08/2018 (esentially unchanged from prior study in 2016), carotid artery stenosis, prior tobacco use for 20 years stopping greater than 13 years ago, DM2, anemia requiring iron  infusions, hypertension, hyperlipidemia, hypothyroidism, sleep apnea on CPAP, and GERD who presents for CAD, mitral regurgitation, and PAF/PSVT.  Remote stress testing from 2005 in [place]  showed no significant ischemia or prior MI, normal LV size and function with an EF of 80%, adequate exercise tolerance, good blood pressure and heart rate response to exercise.  Normal study.  Cardiac CT from 2016 showed an EF of 73%, ectatic ascending aorta measuring 3.72 cm in caliber, normal caliber of the descending segment, three-vessel CAD with mild multifocal calcific narrowing of the proximal LAD and proximal ramus intermedius.  Mild calcific narrowing in the proximal RCA with minimal narrowing in the middle third.  No critical or flow-limiting disease was identified.  Echo from 2016 showed an EF of 60 to 65%, mild concentric LVH, diastolic dysfunction, left atrium upper limits of normal, mild posterior MVP, trace mitral regurgitation, RVSP 27 mmHg, no pericardial effusion.  Patient has previously attributed her A. fib to a URI while on steroids and albuterol .  She has previously not tolerated scheduled beta-blockers secondary to fatigue and reported weight gain.  Multaq led to hair loss.  Echo in 03/2018 showed an EF of 60 to 65%, diastolic dysfunction, normal size left atrium,  normal size right atrium, no significant valvular abnormalities, aortic root 2.8 cm, ascending aorta 3.2 cm, aortic arch 2.8 cm.  Outpatient cardiac monitoring showed an overall sinus rhythm with an average heart rate of 71 bpm.  There were 6 SVT/atrial tachycardic runs with the fastest interval lasting 5 beats with a maximum rate of 167 bpm and the longest interval lasting 14 beats with an average rate of 127 bpm.  Isolated PACs, atrial couplets, and atrial triplets were rare.  Isolated PVCs were rare.  Patient triggered events were either not associated with significant arrhythmia or with clusters of PACs.  CTA aorta on 08/17/2018 that showed an essentially unchanged thoracic aorta measuring 3.8 x 3.6 cm with minimal aortic atherosclerosis and coronary artery calcification.     She was seen in our office in 07/2022 reporting an approximate 4-week history of randomly occurring heart rates from the 30s to 140s bpm with increased fatigue, dizziness, and dyspnea at times with symptoms lasting from several seconds to episodes of tachypalpitations that would persist all day long.  Strips reviewed at that time showed sinus bradycardia, and at times frequent PVCs in a pattern of bigeminy as well as intermittent idioventricular rhythm.  She was taking Toprol -XL 25 mg twice daily, propafenone  225 mg twice daily, and verapamil  240 mg daily.  It was recommended she decrease verapamil  to 120 mg daily with continuation of Toprol  and propafenone .  Subsequent Zio patch demonstrated a predominant rhythm of sinus with an average rate of 64 bpm with a range of 49 to 146 bpm, 5 episodes of SVT occurred with the fastest interval lasting 6 beats with  a maximum rate of 146 bpm and the longest interval lasting 7 beats with an average rate of 121 bpm, rare PACs, atrial couplets and triplets, PVCs, and ventricular couplets.  Seven patient triggered events occurred with sinus rhythm.  She was seen in the office in 08/2022 and felt better on  lower dose of verapamil  with no further significant bradycardic rates.  She had developed a cough with lisinopril  which was discontinued with initiation of losartan .  With prior echo at outside institution showing mitral regurgitation and MVP, she underwent echo in 03/2023 that showed an EF of 60 to 65%, no regional wall motion abnormalities, grade 1 diastolic dysfunction, normal RV systolic function and ventricular cavity size, mildly dilated left atrium, mild mitral regurgitation with moderate late systolic prolapse of both leaflets of the mitral valve, and an estimated right atrial pressure of 3 mmHg.  She was last seen in the office in 03/2023 and was doing well from a cardiac perspective with no changes pursued in cardiac pharmacotherapy at that time.    Echo in 03/2024 showed an EF of 60 to 65%, mild LVH, normal LV diastolic function parameters, normal RV systolic function and ventricular cavity size, mild to moderate mitral valve regurgitation with moderate late systolic prolapse of both leaflets of the mitral valve, tricuspid aortic valve without evidence of insufficiency or stenosis, borderline dilatation of the ascending aorta measuring 37 mm, and a normal CVP.  She comes in today accompanied by her husband and continues to do very well from a cardiac perspective, without symptoms of angina or cardiac decompensation.  No dyspnea, palpitations, dizziness, presyncope, or syncope.  Her weight is down 15 pounds by our scale today, this is intentional through GLP-1 therapy which she is tolerating without significant off target effect.  No lower extremity swelling.  Overall she feels like she is doing very well and does not have any acute cardiac concerns at this time.   Labs independently reviewed: 03/2024 - A1c 6.1 12/2023 - TSH normal, TC 131, TG 200, HDL 43, LDL 47 11/2023 - potassium 4.3, BUN 17, serum creatinine 0.87, Hgb 13.7, PLT 346 05/2023 - albumin 3.7, AST/ALT normal  Past Medical History:   Diagnosis Date   Carotid artery occlusion    20 % left side   Diabetes mellitus without complication (HCC)    Diverticulosis    GERD (gastroesophageal reflux disease)    Gross hematuria 02/02/2022   History of colonic polyps    benign   Hyperlipidemia    Hypertension    Hypothyroidism    Lung infection    Mitral valve prolapse    NSVT (nonsustained ventricular tachycardia) (HCC)    Obstructive sleep apnea    on CPAP   PAF (paroxysmal atrial fibrillation) (HCC)    a.  On Eliquis ; b. CHADS2VASc => 3 (HTN, DM, female)   Skin ulcer of left ankle, limited to breakdown of skin (HCC) 04/02/2020   Sleep apnea     Past Surgical History:  Procedure Laterality Date   ABDOMINAL HYSTERECTOMY  03/07/2000   ovaries remain   CLAVICLE SURGERY  03/07/2005   right clavicle plate pins   COLONOSCOPY WITH PROPOFOL  N/A 10/17/2017   Procedure: COLONOSCOPY WITH PROPOFOL ;  Surgeon: Therisa Bi, MD;  Location: [facility] ENDOSCOPY;  Service: Gastroenterology;  Laterality: N/A;   COLONOSCOPY WITH PROPOFOL  N/A 01/30/2023   Procedure: COLONOSCOPY WITH PROPOFOL ;  Surgeon: Therisa Bi, MD;  Location: [facility] ENDOSCOPY;  Service: Gastroenterology;  Laterality: N/A;   ESOPHAGOGASTRODUODENOSCOPY (EGD) WITH PROPOFOL  N/A  10/17/2017   Procedure: ESOPHAGOGASTRODUODENOSCOPY (EGD) WITH PROPOFOL ;  Surgeon: Therisa Bi, MD;  Location: [facility] ENDOSCOPY;  Service: Gastroenterology;  Laterality: N/A;   GIVENS CAPSULE STUDY N/A 11/30/2017   Procedure: GIVENS CAPSULE STUDY;  Surgeon: Therisa Bi, MD;  Location[facility] ENDOSCOPY;  Service: Gastroenterology;  Laterality: N/A;   HEMOSTASIS CLIP PLACEMENT  01/30/2023   Procedure: HEMOSTASIS CLIP PLACEMENT;  Surgeon: Therisa Bi, MD;  Location: [facility] ENDOSCOPY;  Service: Gastroenterology;;   KNEE ARTHROSCOPY  03/07/2010   Left foot surgery Left 03/03/2022   POLYPECTOMY  01/30/2023   Procedure: POLYPECTOMY;  Surgeon: Therisa Bi, MD;  Location: [facility] ENDOSCOPY;  Service: Gastroenterology;;     Current Medications: Active Medications[1]  Allergies:   Albuterol  and Sulfa  antibiotics   Social History   Socioeconomic History   Marital status: Married    Spouse name: Not on file   Number of children: Not on file   Years of education: Not on file   Highest education level: Associate degree: occupational, scientist, product/process development, or vocational program  Occupational History   Occupation: retired  Tobacco Use   Smoking status: Former    Current packs/day: 0.00    Average packs/day: 1 pack/day for 20.0 years (20.0 ttl pk-yrs)    Types: Cigarettes    Start date: 03/07/1984    Quit date: 03/07/2004    Years since quitting: 20.0   Smokeless tobacco: Never  Vaping Use   Vaping status: Never Used  Substance and Sexual Activity   Alcohol use: No    Alcohol/week: 0.0 standard drinks of alcohol   Drug use: No   Sexual activity: Not on file  Other Topics Concern   Not on file  Social History Narrative   Married.   1 daughter.   Moved here from [place]  due to husbands occupation.   She is a PUBLIC HOUSE MANAGER.   Enjoys sewing, gardening.   Social Drivers of Health   Tobacco Use: Medium Risk (04/04/2024)   Patient History    Smoking Tobacco Use: Former    Smokeless Tobacco Use: Never    Passive Exposure: Not on file  Financial Resource Strain: Low Risk (12/27/2023)   Overall Financial Resource Strain (CARDIA)    Difficulty of Paying Living Expenses: Not hard at all  Food Insecurity: No Food Insecurity (12/27/2023)   Epic    Worried About Programme Researcher, Broadcasting/film/video in the Last Year: Never true    Ran Out of Food in the Last Year: Never true  Transportation Needs: No Transportation Needs (12/27/2023)   Epic    Lack of Transportation (Medical): No    Lack of Transportation (Non-Medical): No  Physical Activity: Insufficiently Active (12/27/2023)   Exercise Vital Sign    Days of Exercise per Week: 2 days    Minutes of Exercise per Session: 20 min  Stress: No Stress Concern Present (12/27/2023)   [employer] - Occupational Stress Questionnaire    Feeling of Stress: Not at all  Social Connections: Socially Integrated (12/27/2023)   Social Connection and Isolation Panel    Frequency of Communication with Friends and Family: More than three times a week    Frequency of Social Gatherings with Friends and Family: Twice a week    Attends Religious Services: More than 4 times per year    Active Member of Clubs or Organizations: Yes    Attends Banker Meetings: More than 4 times per year    Marital Status: Married  Depression (PHQ2-9): Low Risk (12/27/2023)  Depression (PHQ2-9)    PHQ-2 Score: 0  Alcohol Screen: Low Risk (12/27/2023)   Alcohol Screen    Last Alcohol Screening Score (AUDIT): 0  Housing: Low Risk (12/27/2023)   Epic    Unable to Pay for Housing in the Last Year: No    Number of Times Moved in the Last Year: 0    Homeless in the Last Year: No  Utilities: Not At Risk (12/27/2023)   Epic    Threatened with loss of utilities: No  Health Literacy: Adequate Health Literacy (12/27/2023)   B1300 Health Literacy    Frequency of need for help with medical instructions: Never     Family History:  The patient's family history includes Arthritis in her father; Bladder Cancer in her maternal grandmother and sister; Breast cancer in her paternal grandmother; Cancer in her paternal grandfather; Depression in her mother; Diabetes in her brother, maternal grandfather, paternal grandfather, and sister; Emphysema in her brother and father; Heart attack in her mother; Heart disease in her brother and father; Hypertension in her brother and father; Sudden death (age of onset: 46) in her mother.  ROS:   12-point review of systems is negative unless otherwise noted in the HPI.   EKGs/Labs/Other Studies Reviewed:    Studies reviewed were summarized above. The additional studies were reviewed today:  2D echo 03/26/2024: 1. Left ventricular ejection  fraction, by estimation, is 60 to 65%. The  left ventricle has normal function. Left ventricular endocardial border  not optimally defined to evaluate regional wall motion. There is mild left  ventricular hypertrophy. Left  ventricular diastolic parameters were normal. The average left ventricular  global longitudinal strain is -17.1 %. The global longitudinal strain is  normal.   2. Right ventricular systolic function is normal. The right ventricular  size is normal.   3. The mitral valve is myxomatous. Mild to moderate mitral valve  regurgitation. There is moderate late systolic prolapse of both leaflets  of the mitral valve, posterior greater than anterior. A-wave dominant  inflow.   4. The aortic valve is tricuspid. Aortic valve regurgitation is not  visualized. No aortic stenosis is present.   5. Aortic dilatation noted. There is borderline dilatation of the  ascending aorta, measuring 37 mm.   6. The inferior vena cava is normal in size with greater than 50%  respiratory variability, suggesting right atrial pressure of 3 mmHg.   Comparison(s): A prior study was performed on 03/09/2023. No significant  change from prior study.  __________  2D echo 03/09/2023: 1. Left ventricular ejection fraction, by estimation, is 60 to 65%. The  left ventricle has normal function. The left ventricle has no regional  wall motion abnormalities. Left ventricular diastolic parameters are  consistent with Grade I diastolic  dysfunction (impaired relaxation).   2. Right ventricular systolic function is normal. The right ventricular  size is normal.   3. Left atrial size was mildly dilated.   4. The mitral valve is abnormal. Mild mitral valve regurgitation. There  is moderate late systolic prolapse of both leaflets of the mitral valve.   5. The aortic valve is tricuspid. Aortic valve regurgitation is not  visualized.   6. The inferior vena cava is normal in size with greater than 50%  respiratory  variability, suggesting right atrial pressure of 3 mmHg.  __________   Zio patch 07/2022: Normal sinus rhythm Patient had a min HR of 49 bpm, max HR of 146 bpm, and avg HR of 64 bpm.  5 Supraventricular Tachycardia runs occurred, the run with the fastest interval lasting 6 beats with a max rate of 146 bpm, the longest lasting 7 beats with an avg rate of 121 bpm.    Isolated SVEs were rare (<1.0%), SVE Couplets were rare (<1.0%), and SVE Triplets were rare (<1.0%).  Isolated VEs were rare (<1.0%), VE Couplets were rare (<1.0%), and no VE Triplets were present.   Patient triggered events (7) associated with normal sinus rhythm __________   2D echo 04/06/2018: 1. The left ventricle has normal systolic function of 60-65%. The cavity  size is normal. There is no left ventricular wall thickness. Echo evidence  of impaired relaxation diastolic filling patterns.   2. Normal left atrial size.   3. Normal right atrial size.   4. Normal tricuspid valve.   5. There is normal right ventricular systolic function. b Right  ventricular systolic pressure could not be assessed.  __________   Zio patch 03/2018: Normal sinus rhythm Average Heart rate: 71 bpm.    6 Supraventricular Tachycardia/atrial tachyardia runs occurred, the run with the fastest interval lasting 5 beats with a max rate of 167 bpm,  the longest lasting 14 beats with an avg rate of 127 bpm.    Isolated SVEs were rare (<1.0%), SVE Couplets were rare (<1.0%), and SVE Triplets were rare (<1.0%).  Isolated VEs were rare (<1.0%), and no VE Couplets or VE Triplets were present.   Patient triggered events were either not associated with significant arrhythmia, or with clusters of APCs.   EKG:  EKG is ordered today.  The EKG ordered today demonstrates NSR, 61 bpm, right axis deviation, incomplete RBBB, nonspecific ST-T changes  Recent Labs: 05/15/2023: ALT 25 11/22/2023: BUN 17; Creatinine, Ser 0.87; Hemoglobin 13.7; Platelets 346.0;  Potassium 4.3; Sodium 135 12/27/2023: TSH 1.08  Recent Lipid Panel    Component Value Date/Time   CHOL 131 12/27/2023 0943   TRIG 200.0 (H) 12/27/2023 0943   HDL 43.40 12/27/2023 0943   CHOLHDL 3 12/27/2023 0943   VLDL 40.0 12/27/2023 0943   LDLCALC 47 12/27/2023 0943    PHYSICAL EXAM:    VS:  BP 120/70 (BP Location: Left Arm, Patient Position: Sitting, Cuff Size: Normal)   Pulse 61 Comment: 60 oximeter  Ht 5' 6 (1.676 m)   Wt 155 lb (70.3 kg)   SpO2 95%   BMI 25.02 kg/m   BMI: Body mass index is 25.02 kg/m.  Physical Exam Vitals reviewed.  Constitutional:      Appearance: She is well-developed.  HENT:     Head: Normocephalic and atraumatic.  Eyes:     General:        Right eye: No discharge.        Left eye: No discharge.  Cardiovascular:     Rate and Rhythm: Normal rate and regular rhythm.     Pulses:          Posterior tibial pulses are 2+ on the right side and 2+ on the left side.     Heart sounds: Normal heart sounds, S1 normal and S2 normal. Heart sounds not distant. No midsystolic click and no opening snap. No murmur heard.    No friction rub.  Pulmonary:     Effort: Pulmonary effort is normal. No respiratory distress.     Breath sounds: Normal breath sounds. No decreased breath sounds, wheezing, rhonchi or rales.  Musculoskeletal:     Cervical back: Normal range of motion.     Right lower leg:  No edema.     Left lower leg: No edema.  Skin:    General: Skin is warm and dry.     Nails: There is no clubbing.  Neurological:     Mental Status: She is alert and oriented to person, place, and time.  Psychiatric:        Speech: Speech normal.        Behavior: Behavior normal.        Thought Content: Thought content normal.        Judgment: Judgment normal.     Wt Readings from Last 3 Encounters:  04/04/24 155 lb (70.3 kg)  01/16/24 159 lb 12.8 oz (72.5 kg)  12/27/23 163 lb (73.9 kg)     ASSESSMENT & PLAN:   CAD involving native coronary arteries  without angina: She is doing very well and without symptoms concerning for angina or cardiac decompensation.  Continue aggressive risk factor modification and primary prevention including aspirin  81 mg, Toprol -XL 25 mg, rosuvastatin  5 mg, and ezetimibe  10 mg.  No indication for ischemic testing at this time.  Palpitations with idioventricular rhythm/PSVT/PVC/NSVT: Quiescent.  Remains on Toprol -XL, verapamil , propafenone , and as needed Lopressor .  PAF: Lone episode in [place]  during acute illness.  Has preferred to remain off OAC.  Subsequent outpatient cardiac monitoring has shown no evidence of recurrence.  She remains on Toprol -XL 25 mg, propafenone  to 25 mg twice daily, and verapamil  120 mg daily.  Mitral valve prolapse with mitral regurgitation: Echo earlier this month demonstrated preserved LV systolic function with mild to moderate mitral valve regurgitation with moderate late systolic prolapse of both leaflets of the mitral valve, posterior greater than anterior.  When compared to echo in 03/2023, mitral valve regurgitation has progressed slightly from a mild with stable moderate late systolic prolapse of both leaflets of the mitral valve.  Asymptomatic.  No murmur noted on exam.  Follow-up echo in 12 months.  HTN: Blood pressure is well-controlled in the office today.  She remains on losartan  50 mg, verapamil  120 mg, and Toprol -XL 25 mg.  HLD: LDL 47 in 12/2023.  Remains on ezetimibe  10 mg and rosuvastatin  5 mg.  Ectatic ascending aorta: Borderline dilatation of the ascending aorta measuring 37 mm with normal size and structure aortic root by echo earlier this month.  Stable, dating back to at least 2016.  Continue optimal blood pressure control.  Carotid artery stenosis: Update carotid artery ultrasound prior to next OV.  Is on aspirin , rosuvastatin , and ezetimibe .   Disposition: F/u with Dr. Gollan or an APP in 12 months.   Medication Adjustments/Labs and Tests Ordered: Current  medicines are reviewed at length with the patient today.  Concerns regarding medicines are outlined above. Medication changes, Labs and Tests ordered today are summarized above and listed in the Patient Instructions accessible in Encounters.   Signed, Bernardino Bring, PA-C 04/04/2024 12:19 PM     [facility] [place] [address] [place], [place] [address] [phone]     [1]  Current Meds  Medication Sig   Accu-Chek FastClix Lancets MISC USE TO TEST BLOOD SUGAR TWICE DAILY AS DIRECTED   amitriptyline  (ELAVIL ) 50 MG tablet TAKE 1 TABLET BY MOUTH AT BEDTIME FOR BACK PAIN   amoxicillin -clavulanate (AUGMENTIN ) 875-125 MG tablet Take 1 tablet by mouth every 12 (twelve) hours.   aspirin  EC 81 MG tablet Take 81 mg by mouth daily. Swallow whole.   b complex vitamins capsule Take 1 capsule by mouth 2 (two)  times daily.   Continuous Glucose Sensor (FREESTYLE LIBRE 3 SENSOR) MISC PLACE 1 SENSOR ON THE SKIN EVERY 14 DAYS. USE TO CHECK GLUCOSE CONTINUOUSLY   famotidine  (PEPCID ) 20 MG tablet Take 1 tablet (20 mg total) by mouth 2 (two) times daily. For heartburn.   gentamicin  ointment (GARAMYCIN ) 0.1 % Apply 1 Application topically daily as needed. For nasal sore.   glipiZIDE  (GLUCOTROL  XL) 5 MG 24 hr tablet TAKE 1 TABLET DAILY WITH BREAKFAST FOR DIABETES   glucose blood test strip Use to test blood sugar three times daily   indomethacin  (INDOCIN ) 50 MG capsule Take 1 capsule (50 mg total) by mouth 3 (three) times daily with meals. As needed for joint swelling.   ipratropium (ATROVENT  HFA) 17 MCG/ACT inhaler Inhale 2 puffs into the lungs every 4 (four) hours as needed for wheezing.   ipratropium (ATROVENT ) 0.03 % nasal spray Place 2 sprays into both nostrils every 12 (twelve) hours.   Iron -Vitamin C 65-125 MG TABS Take by mouth daily.    Magnesium  500 MG CAPS Take 1 capsule by mouth 2 (two) times daily.   methocarbamol  (ROBAXIN ) 750 MG tablet Take 375-750 mg by mouth at  bedtime as needed.   metoprolol  tartrate (LOPRESSOR ) 25 MG tablet Take 0.5 tablet (12.5 mg) by mouth once daily as needed for palpitations   Multiple Vitamins-Minerals (MULTIVITAMIN ADULT PO) Take 1 capsule by mouth daily.    propafenone  (RYTHMOL  SR) 225 MG 12 hr capsule TAKE 1 CAPSULE BY MOUTH 2 TIMES DAILY   SYNTHROID  88 MCG tablet Take 1 tablet by mouth every morning on an empty stomach with water only.  No food or other medications for 30 minutes.   tirzepatide  (MOUNJARO ) 5 MG/0.5ML Pen Inject 5 mg into the skin once a week. for diabetes.   [DISCONTINUED] ezetimibe  (ZETIA ) 10 MG tablet TAKE 1 TABLET BY MOUTH DAILY   [DISCONTINUED] hydrochlorothiazide  (HYDRODIURIL ) 25 MG tablet TAKE 1 TABLET DAILY   [DISCONTINUED] losartan  (COZAAR ) 50 MG tablet TAKE 1 TABLET DAILY   [DISCONTINUED] metoprolol  succinate (TOPROL -XL) 25 MG 24 hr tablet TAKE 1 TABLET TWICE A DAY WITH OR IMMEDIATELY FOLLOWING A MEAL   [DISCONTINUED] rosuvastatin  (CRESTOR ) 5 MG tablet TAKE 1 TABLET BY MOUTH DAILY GENERIC EQUIVALENT FOR CRESTOR    [DISCONTINUED] verapamil  (CALAN -SR) 120 MG CR tablet Take 1 tablet (120 mg total) by mouth daily. KEEP OV.   "

## 2024-04-04 ENCOUNTER — Ambulatory Visit: Attending: Physician Assistant | Admitting: Physician Assistant

## 2024-04-04 ENCOUNTER — Encounter: Payer: Self-pay | Admitting: Physician Assistant

## 2024-04-04 VITALS — BP 120/70 | HR 61 | Ht 66.0 in | Wt 155.0 lb

## 2024-04-04 DIAGNOSIS — E782 Mixed hyperlipidemia: Secondary | ICD-10-CM | POA: Insufficient documentation

## 2024-04-04 DIAGNOSIS — I341 Nonrheumatic mitral (valve) prolapse: Secondary | ICD-10-CM | POA: Insufficient documentation

## 2024-04-04 DIAGNOSIS — I77819 Aortic ectasia, unspecified site: Secondary | ICD-10-CM | POA: Insufficient documentation

## 2024-04-04 DIAGNOSIS — I6529 Occlusion and stenosis of unspecified carotid artery: Secondary | ICD-10-CM | POA: Diagnosis present

## 2024-04-04 DIAGNOSIS — E785 Hyperlipidemia, unspecified: Secondary | ICD-10-CM | POA: Diagnosis present

## 2024-04-04 DIAGNOSIS — I10 Essential (primary) hypertension: Secondary | ICD-10-CM | POA: Insufficient documentation

## 2024-04-04 DIAGNOSIS — I4729 Other ventricular tachycardia: Secondary | ICD-10-CM | POA: Insufficient documentation

## 2024-04-04 DIAGNOSIS — I493 Ventricular premature depolarization: Secondary | ICD-10-CM | POA: Insufficient documentation

## 2024-04-04 DIAGNOSIS — I471 Supraventricular tachycardia, unspecified: Secondary | ICD-10-CM | POA: Diagnosis present

## 2024-04-04 DIAGNOSIS — I340 Nonrheumatic mitral (valve) insufficiency: Secondary | ICD-10-CM | POA: Diagnosis present

## 2024-04-04 DIAGNOSIS — R002 Palpitations: Secondary | ICD-10-CM | POA: Diagnosis present

## 2024-04-04 DIAGNOSIS — I4891 Unspecified atrial fibrillation: Secondary | ICD-10-CM | POA: Diagnosis present

## 2024-04-04 DIAGNOSIS — I2510 Atherosclerotic heart disease of native coronary artery without angina pectoris: Secondary | ICD-10-CM | POA: Insufficient documentation

## 2024-04-04 MED ORDER — VERAPAMIL HCL ER 120 MG PO TBCR: 120.0000 mg | EXTENDED_RELEASE_TABLET | Freq: Every day | ORAL | 3 refills | Status: AC

## 2024-04-04 MED ORDER — ROSUVASTATIN CALCIUM 5 MG PO TABS: ORAL_TABLET | ORAL | 3 refills | Status: AC

## 2024-04-04 MED ORDER — EZETIMIBE 10 MG PO TABS: 10.0000 mg | ORAL_TABLET | Freq: Every day | ORAL | 3 refills | Status: AC

## 2024-04-04 MED ORDER — HYDROCHLOROTHIAZIDE 25 MG PO TABS: 25.0000 mg | ORAL_TABLET | Freq: Every day | ORAL | 3 refills | Status: AC

## 2024-04-04 MED ORDER — LOSARTAN POTASSIUM 50 MG PO TABS: 50.0000 mg | ORAL_TABLET | Freq: Every day | ORAL | 3 refills | Status: AC

## 2024-04-04 MED ORDER — METOPROLOL SUCCINATE ER 25 MG PO TB24: 25.0000 mg | ORAL_TABLET | Freq: Two times a day (BID) | ORAL | 3 refills | Status: AC

## 2024-04-04 NOTE — Patient Instructions (Signed)
 Medication Instructions:  Your physician recommends that you continue on your current medications as directed. Please refer to the Current Medication list given to you today.   *If you need a refill on your cardiac medications before your next appointment, please call your pharmacy*  Lab Work: None ordered at this time  If you have labs (blood work) drawn today and your tests are completely normal, you will receive your results only by: MyChart Message (if you have MyChart) OR A paper copy in the mail If you have any lab test that is abnormal or we need to change your treatment, we will call you to review the results.  Testing/Procedures: Your physician has requested that you have an echocardiogram in 12 months. Echocardiography is a painless test that uses sound waves to create images of your heart. It provides your doctor with information about the size and shape of your heart and how well your hearts chambers and valves are working.   You may receive an ultrasound enhancing agent through an IV if needed to better visualize your heart during the echo. This procedure takes approximately one hour.  There are no restrictions for this procedure.  This will take place at [address] [address], [place] [address]  Your physician has requested that you have a carotid duplex in 12 months. This test is an ultrasound of the carotid arteries in your neck. It looks at blood flow through these arteries that supply the brain with blood.   Allow one hour for this exam.  There are no restrictions or special instructions.  This will take place at [address] [facility], [place] [address]  Please note: We ask at that you not bring children with you during ultrasound (echo/ vascular) testing. Due to room size and safety concerns, children are not allowed in the ultrasound rooms during exams. Our front office staff cannot provide observation of children in  our lobby area while testing is being conducted. An adult accompanying a patient to their appointment will only be allowed in the ultrasound room at the discretion of the ultrasound technician under special circumstances. We apologize for any inconvenience.   Follow-Up: At [facility], you and your health needs are our priority.  As part of our continuing mission to provide you with exceptional heart care, our providers are all part of one team.  This team includes your primary Cardiologist (physician) and Advanced Practice Providers or APPs (Physician Assistants and Nurse Practitioners) who all work together to provide you with the care you need, when you need it.  Your next appointment:   12 month(s) (after carotid and echo)  Provider:   Bernardino Bring, PA-C

## 2024-04-05 DIAGNOSIS — E1159 Type 2 diabetes mellitus with other circulatory complications: Secondary | ICD-10-CM

## 2024-04-09 ENCOUNTER — Ambulatory Visit
Admission: RE | Admit: 2024-04-09 | Discharge: 2024-04-09 | Disposition: A | Source: Ambulatory Visit | Attending: Primary Care

## 2024-04-09 DIAGNOSIS — Z1231 Encounter for screening mammogram for malignant neoplasm of breast: Secondary | ICD-10-CM

## 2024-04-09 MED ORDER — MOUNJARO 7.5 MG/0.5ML ~~LOC~~ SOAJ: 7.5000 mg | SUBCUTANEOUS | 0 refills | Status: AC

## 2024-04-11 ENCOUNTER — Ambulatory Visit: Payer: Self-pay | Admitting: Primary Care

## 2024-04-12 NOTE — Telephone Encounter (Signed)
"    Called pt and schedule diabetes follow up  "

## 2024-06-18 ENCOUNTER — Ambulatory Visit: Admitting: Primary Care
